# Patient Record
Sex: Male | Born: 1970
Health system: Southern US, Community
[De-identification: ages and names within clinical notes are randomized; demographics above are authoritative.]

## PROBLEM LIST (undated history)

## (undated) DIAGNOSIS — J45909 Unspecified asthma, uncomplicated: Secondary | ICD-10-CM

## (undated) DIAGNOSIS — I10 Essential (primary) hypertension: Secondary | ICD-10-CM

## (undated) DIAGNOSIS — K429 Umbilical hernia without obstruction or gangrene: Secondary | ICD-10-CM

## (undated) DIAGNOSIS — L400 Psoriasis vulgaris: Secondary | ICD-10-CM

## (undated) DIAGNOSIS — J302 Other seasonal allergic rhinitis: Secondary | ICD-10-CM

## (undated) HISTORY — DX: Unspecified asthma, uncomplicated: J45.909

## (undated) HISTORY — DX: Umbilical hernia without obstruction or gangrene: K42.9

## (undated) HISTORY — DX: Psoriasis vulgaris: L40.0

## (undated) HISTORY — PX: TYMPANOSTOMY TUBE PLACEMENT: SHX32

---

## 2009-08-12 ENCOUNTER — Encounter: Admission: RE | Admit: 2009-08-12 | Discharge: 2009-11-10 | Payer: Self-pay | Admitting: Unknown Physician Specialty

## 2009-12-13 ENCOUNTER — Ambulatory Visit (HOSPITAL_BASED_OUTPATIENT_CLINIC_OR_DEPARTMENT_OTHER): Admission: RE | Admit: 2009-12-13 | Discharge: 2009-12-13 | Payer: Self-pay | Admitting: Urology

## 2010-10-28 LAB — GLUCOSE, CAPILLARY: Glucose-Capillary: 136 mg/dL — ABNORMAL HIGH (ref 70–99)

## 2010-10-28 LAB — POCT I-STAT 4, (NA,K, GLUC, HGB,HCT)
Glucose, Bld: 115 mg/dL — ABNORMAL HIGH (ref 70–99)
HCT: 52 % (ref 39.0–52.0)
Hemoglobin: 17.7 g/dL — ABNORMAL HIGH (ref 13.0–17.0)
Potassium: 5.2 mEq/L — ABNORMAL HIGH (ref 3.5–5.1)
Sodium: 137 mEq/L (ref 135–145)

## 2012-01-12 ENCOUNTER — Encounter (HOSPITAL_COMMUNITY): Payer: Self-pay

## 2012-01-12 ENCOUNTER — Emergency Department (INDEPENDENT_AMBULATORY_CARE_PROVIDER_SITE_OTHER)
Admission: EM | Admit: 2012-01-12 | Discharge: 2012-01-12 | Disposition: A | Discharge status: Home / Self Care | Payer: Self-pay | Source: Home / Self Care | Attending: Emergency Medicine | Admitting: Emergency Medicine

## 2012-01-12 DIAGNOSIS — I10 Essential (primary) hypertension: Secondary | ICD-10-CM

## 2012-01-12 DIAGNOSIS — E119 Type 2 diabetes mellitus without complications: Secondary | ICD-10-CM

## 2012-01-12 DIAGNOSIS — J309 Allergic rhinitis, unspecified: Secondary | ICD-10-CM

## 2012-01-12 HISTORY — DX: Essential (primary) hypertension: I10

## 2012-01-12 LAB — POCT I-STAT, CHEM 8
BUN: 9 mg/dL (ref 6–23)
Calcium, Ion: 1.18 mmol/L (ref 1.12–1.32)
Chloride: 105 mEq/L (ref 96–112)
Creatinine, Ser: 0.9 mg/dL (ref 0.50–1.35)
Glucose, Bld: 90 mg/dL (ref 70–99)
HCT: 54 % — ABNORMAL HIGH (ref 39.0–52.0)
Hemoglobin: 18.4 g/dL — ABNORMAL HIGH (ref 13.0–17.0)
Potassium: 4.1 mEq/L (ref 3.5–5.1)
Sodium: 143 mEq/L (ref 135–145)
TCO2: 25 mmol/L (ref 0–100)

## 2012-01-12 MED ORDER — FREESTYLE LANCETS MISC: Status: AC

## 2012-01-12 MED ORDER — GLUCOSE BLOOD VI STRP: ORAL_STRIP | Status: AC

## 2012-01-12 MED ORDER — LORATADINE 10 MG PO TABS: 10.0000 mg | ORAL_TABLET | Freq: Every day | ORAL | Status: DC

## 2012-01-12 MED ORDER — LORATADINE-PSEUDOEPHEDRINE ER 10-240 MG PO TB24: 1.0000 | ORAL_TABLET | Freq: Every day | ORAL | Status: DC

## 2012-01-12 MED ORDER — LISINOPRIL 10 MG PO TABS: 10.0000 mg | ORAL_TABLET | Freq: Every day | ORAL | Status: DC

## 2012-01-12 MED ORDER — METFORMIN HCL 500 MG PO TABS: ORAL_TABLET | ORAL | Status: DC

## 2012-01-12 NOTE — ED Provider Notes (Signed)
Chief Complaint  Patient presents with  . Medication Refill    History of Present Illness:  The patient is a 41 year old male with a 2 to three-year history of type 2 diabetes. He's been on metformin 500 mg 2 in the morning and 1 in the evening at dinnertime. He has not been checking his blood sugars, but does have a FreeStyle machine at home. He denies polyuria, polydipsia, or blurry vision. He's had no hypoglycemic episodes. He denies any changes in his vision. He did see an eye doctor a couple weeks ago and had a normal eye exam including a funduscopic exam, although the doctor was an optometrist. He denies any chest pain, tightness, pressure, or shortness of breath. He's had no extremity pain, paresthesias, swelling, or ulceration. His weight has been stable. He is trying to follow a good diet and exercises 3 days a week. He also has high blood pressure and is on lisinopril 10 mg per day without medication side effects. He does try to limit his salt intake. He checks his blood pressure at home and his range is 100 to 120s systolic over 80-85 diastolic. He also has a history of allergies with ear congestion and nasal congestion for which he is taking cetirizine 10 mg per day.  Review of Systems:  Other than noted above, the patient denies any of the following symptoms. Systemic:  No fever, chills, fatigue, weight loss or gain. Eye:  No blurred vision or diplopia. Lungs:  No cough, wheezing, or shortness of breath. Heart:  No chest pain, tightness, pressure, palpitation, dizziness, syncope, or edema. Abdomen:  No abdominal pain, nausea, vomiting or diarrrhea. GU:  No dysuria, frequency, urgency, hematuria. Ext:  No pain, paresthesias, swelling, or ulcerations. Endocrine:  No polyuria, polydipsia, heat or cold intolerance. Skin:  No rash or itching. Neuro:  No focal weakness or numbness.   PMFSH:  Past medical history, family history, social history, meds, and allergies were  reviewed.  Physical Exam:   Vital signs:  BP 129/82  Pulse 77  Temp(Src) 98.6 F (37 C) (Oral)  Resp 20  SpO2 100% Gen:  Alert, oriented, in no distress. Eye:  PERRL, full EOM, lids conjunctivas, and sclera unremarkable. ENT:  TMs and canals normal.  Mucous membranes moist.  No acetone odor.  Pharynx clear.   Neck:  Supple, full ROM, no adenopathy or tenderness.  No JVD. Lungs:  Clear to auscultation.  No wheezes, rales or rhonchi. Heart:  Regular rhythm.  No gallops or murmers. Abdomen:  Soft, flat, non-distended, nontener.  No hepato-splenomegaly or mass.  Bowel sounds normal.  No pulsatile midline mass or bruit. Ext:  No edema, pulses full.  No ulceration or skin lesions. Skin:  Clear, warm and dry.  No rash or lesions. Neuro:  Alert and oriented times 3.  No focal weakness.  Speech normal.  CNs intact.  Labs:   Results for orders placed during the hospital encounter of 01/12/12  POCT I-STAT, CHEM 8      Component Value Range   Sodium 143  135 - 145 (mEq/L)   Potassium 4.1  3.5 - 5.1 (mEq/L)   Chloride 105  96 - 112 (mEq/L)   BUN 9  6 - 23 (mg/dL)   Creatinine, Ser 1.61  0.50 - 1.35 (mg/dL)   Glucose, Bld 90  70 - 99 (mg/dL)   Calcium, Ion 0.96  0.45 - 1.32 (mmol/L)   TCO2 25  0 - 100 (mmol/L)   Hemoglobin 18.4 (*) 13.0 -  17.0 (g/dL)   HCT 28.4 (*) 13.2 - 52.0 (%)    Assessment:  The primary encounter diagnosis was Diabetes type 2, controlled. Diagnoses of Hypertension and Allergic rhinitis were also pertinent to this visit.  Plan:   1.  The following meds were prescribed:   New Prescriptions   GLUCOSE BLOOD (FREESTYLE LITE) TEST STRIP    Use as instructed   LANCETS (FREESTYLE) LANCETS    Use as instructed   LISINOPRIL (PRINIVIL) 10 MG TABLET    Take 1 tablet (10 mg total) by mouth daily.   LORATADINE (CLARITIN) 10 MG TABLET    Take 1 tablet (10 mg total) by mouth daily.   LORATADINE-PSEUDOEPHEDRINE (CLARITIN-D 24 HOUR) 10-240 MG PER 24 HR TABLET    Take 1 tablet by  mouth daily.   METFORMIN (GLUCOPHAGE) 500 MG TABLET    2 at breakfast and 1 with evening meal.   2.  The patient was instructed in symptomatic care and handouts were given. 3.  The patient was told to return if becoming worse in any way, if no better in 3 or 4 days, and given some red flag symptoms that would indicate earlier return. He was told to find a primary care physician as soon as possible to give him ongoing refills for this medication. I did give him enough for about 2 months.    Reuben Likes, MD 01/12/12 2142

## 2012-01-12 NOTE — ED Notes (Signed)
Pt recently unemployed, lost insurance, cannot see MD ; needs refills of lisinopril, metformin, citerizine

## 2012-01-12 NOTE — Discharge Instructions (Signed)
Arterial Hypertension Arterial hypertension (high blood pressure) is a condition of elevated pressure in your blood vessels. Hypertension over a long period of time is a risk factor for strokes, heart attacks, and heart failure. It is also the leading cause of kidney (renal) failure.  CAUSES   In Adults -- Over 90% of all hypertension has no known cause. This is called essential or primary hypertension. In the other 10% of people with hypertension, the increase in blood pressure is caused by another disorder. This is called secondary hypertension. Important causes of secondary hypertension are:   Heavy alcohol use.   Obstructive sleep apnea.   Hyperaldosterosim (Conn's syndrome).   Steroid use.   Chronic kidney failure.   Hyperparathyroidism.   Medications.   Renal artery stenosis.   Pheochromocytoma.   Cushing's disease.   Coarctation of the aorta.   Scleroderma renal crisis.   Licorice (in excessive amounts).   Drugs (cocaine, methamphetamine).  Your caregiver can explain any items above that apply to you.  In Children -- Secondary hypertension is more common and should always be considered.   Pregnancy -- Few women of childbearing age have high blood pressure. However, up to 10% of them develop hypertension of pregnancy. Generally, this will not harm the woman. It Even be a sign of 3 complications of pregnancy: preeclampsia, HELLP syndrome, and eclampsia. Follow up and control with medication is necessary.  SYMPTOMS   This condition normally does not produce any noticeable symptoms. It is usually found during a routine exam.   Malignant hypertension is a late problem of high blood pressure. It Monger have the following symptoms:   Headaches.   Blurred vision.   End-organ damage (this means your kidneys, heart, lungs, and other organs are being damaged).   Stressful situations can increase the blood pressure. If a person with normal blood pressure has their blood  pressure go up while being seen by their caregiver, this is often termed "white coat hypertension." Its importance is not known. It Alkire be related with eventually developing hypertension or complications of hypertension.   Hypertension is often confused with mental tension, stress, and anxiety.  DIAGNOSIS  The diagnosis is made by 3 separate blood pressure measurements. They are taken at least 1 week apart from each other. If there is organ damage from hypertension, the diagnosis Lemire be made without repeat measurements. Hypertension is usually identified by having blood pressure readings:  Above 140/90 mmHg measured in both arms, at 3 separate times, over a couple weeks.   Over 130/80 mmHg should be considered a risk factor and Grisanti require treatment in patients with diabetes.  Blood pressure readings over 120/80 mmHg are called "pre-hypertension" even in non-diabetic patients. To get a true blood pressure measurement, use the following guidelines. Be aware of the factors that can alter blood pressure readings.  Take measurements at least 1 hour after caffeine.   Take measurements 30 minutes after smoking and without any stress. This is another reason to quit smoking - it raises your blood pressure.   Use a proper cuff size. Ask your caregiver if you are not sure about your cuff size.   Most home blood pressure cuffs are automatic. They will measure systolic and diastolic pressures. The systolic pressure is the pressure reading at the start of sounds. Diastolic pressure is the pressure at which the sounds disappear. If you are elderly, measure pressures in multiple postures. Try sitting, lying or standing.   Sit at rest for a minimum of   5 minutes before taking measurements.   You should not be on any medications like decongestants. These are found in many cold medications.   Record your blood pressure readings and review them with your caregiver.  If you have hypertension:  Your caregiver  may do tests to be sure you do not have secondary hypertension (see "causes" above).   Your caregiver may also look for signs of metabolic syndrome. This is also called Syndrome X or Insulin Resistance Syndrome. You may have this syndrome if you have type 2 diabetes, abdominal obesity, and abnormal blood lipids in addition to hypertension.   Your caregiver will take your medical and family history and perform a physical exam.   Diagnostic tests may include blood tests (for glucose, cholesterol, potassium, and kidney function), a urinalysis, or an EKG. Other tests may also be necessary depending on your condition.  PREVENTION  There are important lifestyle issues that you can adopt to reduce your chance of developing hypertension:  Maintain a normal weight.   Limit the amount of salt (sodium) in your diet.   Exercise often.   Limit alcohol intake.   Get enough potassium in your diet. Discuss specific advice with your caregiver.   Follow a DASH diet (dietary approaches to stop hypertension). This diet is rich in fruits, vegetables, and low-fat dairy products, and avoids certain fats.  PROGNOSIS  Essential hypertension cannot be cured. Lifestyle changes and medical treatment can lower blood pressure and reduce complications. The prognosis of secondary hypertension depends on the underlying cause. Many people whose hypertension is controlled with medicine or lifestyle changes can live a normal, healthy life.  RISKS AND COMPLICATIONS  While high blood pressure alone is not an illness, it often requires treatment due to its short- and long-term effects on many organs. Hypertension increases your risk for:  CVAs or strokes (cerebrovascular accident).   Heart failure due to chronically high blood pressure (hypertensive cardiomyopathy).   Heart attack (myocardial infarction).   Damage to the retina (hypertensive retinopathy).   Kidney failure (hypertensive nephropathy).  Your caregiver can  explain list items above that apply to you. Treatment of hypertension can significantly reduce the risk of complications. TREATMENT   For overweight patients, weight loss and regular exercise are recommended. Physical fitness lowers blood pressure.   Mild hypertension is usually treated with diet and exercise. A diet rich in fruits and vegetables, fat-free dairy products, and foods low in fat and salt (sodium) can help lower blood pressure. Decreasing salt intake decreases blood pressure in a 1/3 of people.   Stop smoking if you are a smoker.  The steps above are highly effective in reducing blood pressure. While these actions are easy to suggest, they are difficult to achieve. Most patients with moderate or severe hypertension end up requiring medications to bring their blood pressure down to a normal level. There are several classes of medications for treatment. Blood pressure pills (antihypertensives) will lower blood pressure by their different actions. Lowering the blood pressure by 10 mmHg may decrease the risk of complications by as much as 25%. The goal of treatment is effective blood pressure control. This will reduce your risk for complications. Your caregiver will help you determine the best treatment for you according to your lifestyle. What is excellent treatment for one person, may not be for you. HOME CARE INSTRUCTIONS   Do not smoke.   Follow the lifestyle changes outlined in the "Prevention" section.   If you are on medications, follow the directions   carefully. Blood pressure medications must be taken as prescribed. Skipping doses reduces their benefit. It also puts you at risk for problems.   Follow up with your caregiver, as directed.   If you are asked to monitor your blood pressure at home, follow the guidelines in the "Diagnosis" section above.  SEEK MEDICAL CARE IF:   You think you are having medication side effects.   You have recurrent headaches or lightheadedness.     You have swelling in your ankles.   You have trouble with your vision.  SEEK IMMEDIATE MEDICAL CARE IF:   You have sudden onset of chest pain or pressure, difficulty breathing, or other symptoms of a heart attack.   You have a severe headache.   You have symptoms of a stroke (such as sudden weakness, difficulty speaking, difficulty walking).  MAKE SURE YOU:   Understand these instructions.   Will watch your condition.   Will get help right away if you are not doing well or get worse.  Document Released: 07/27/2005 Document Revised: 07/16/2011 Document Reviewed: 02/24/2007 [facility] Patient Information 2012 [place], [place].Diabetes and Exercise Regular exercise is important and can help:   Control blood glucose (sugar).   Decrease blood pressure.    Control blood lipids (cholesterol, triglycerides).   Improve overall health.  BENEFITS FROM EXERCISE  Improved fitness.   Improved flexibility.   Improved endurance.   Increased bone density.   Weight control.   Increased muscle strength.   Decreased body fat.   Improvement of the body's use of insulin, a hormone.   Increased insulin sensitivity.   Reduction of insulin needs.   Reduced stress and tension.   Helps you feel better.  People with diabetes who add exercise to their lifestyle gain additional benefits, including:  Weight loss.   Reduced appetite.   Improvement of the body's use of blood glucose.   Decreased risk factors for heart disease:   Lowering of cholesterol and triglycerides.   Raising the level of good cholesterol (high-density lipoproteins, HDL).   Lowering blood sugar.   Decreased blood pressure.  TYPE 1 DIABETES AND EXERCISE  Exercise will usually lower your blood glucose.   If blood glucose is greater than 240 mg/dl, check urine ketones. If ketones are present, do not exercise.   Location of the insulin injection sites may need to be adjusted with exercise. Avoid  injecting insulin into areas of the body that will be exercised. For example, avoid injecting insulin into:   The arms when playing tennis.   The legs when jogging. For more information, discuss this with your caregiver.   Keep a record of:   Food intake.   Type and amount of exercise.   Expected peak times of insulin action.   Blood glucose levels.  Do this before, during, and after exercise. Review your records with your caregiver. This will help you to develop guidelines for adjusting food intake and insulin amounts.  TYPE 2 DIABETES AND EXERCISE  Regular physical activity can help control blood glucose.   Exercise is important because it may:   Increase the body's sensitivity to insulin.   Improve blood glucose control.   Exercise reduces the risk of heart disease. It decreases serum cholesterol and triglycerides. It also lowers blood pressure.   Those who take insulin or oral hypoglycemic agents should watch for signs of hypoglycemia. These signs include dizziness, shaking, sweating, chills, and confusion.   Body water is lost during exercise. It must be replaced. This will  help to avoid loss of body fluids (dehydration) or heat stroke.  Be sure to talk to your caregiver before starting an exercise program to make sure it is safe for you. Remember, any activity is better than none.  Document Released: 10/17/2003 Document Revised: 07/16/2011 Document Reviewed: 01/31/2009 [facility] Patient Information 2012 [place], [place].Diabetes, Type 2 Diabetes is a long-lasting (chronic) disease. In type 2 diabetes, the pancreas does not make enough insulin (a hormone), and the body does not respond normally to the insulin that is made. This type of diabetes was also previously called adult-onset diabetes. It usually occurs after the age of 34, but it can occur at any age.  CAUSES  Type 2 diabetes happens because the pancreasis not making enough insulin or your body has trouble using the  insulin that your pancreas does make properly. SYMPTOMS   Drinking more than usual.   Urinating more than usual.   Blurred vision.   Dry, itchy skin.   Frequent infections.   Feeling more tired than usual (fatigue).  DIAGNOSIS The diagnosis of type 2 diabetes is usually made by one of the following tests:  Fasting blood glucose test. You will not eat for at least 8 hours and then take a blood test.   Random blood glucose test. Your blood glucose (sugar) is checked at any time of the day regardless of when you ate.   Oral glucose tolerance test (OGTT). Your blood glucose is measured after you have not eaten (fasted) and then after you drink a glucose containing beverage.  TREATMENT   Healthy eating.   Exercise.   Medicine, if needed.   Monitoring blood glucose.   Seeing your caregiver regularly.  HOME CARE INSTRUCTIONS   Check your blood glucose at least once a day. More frequent monitoring may be necessary, depending on your medicines and on how well your diabetes is controlled. Your caregiver will advise you.   Take your medicine as directed by your caregiver.   Do not smoke.   Make wise food choices. Ask your caregiver for information. Weight loss can improve your diabetes.   Learn about low blood glucose (hypoglycemia) and how to treat it.   Get your eyes checked regularly.   Have a yearly physical exam. Have your blood pressure checked and your blood and urine tested.   Wear a pendant or bracelet saying that you have diabetes.   Check your feet every night for cuts, sores, blisters, and redness. Let your caregiver know if you have any problems.  SEEK MEDICAL CARE IF:   You have problems keeping your blood glucose in target range.   You have problems with your medicines.   You have symptoms of an illness that do not improve after 24 hours.   You have a sore or wound that is not healing.   You notice a change in vision or a new problem with your vision.    You have a fever.  MAKE SURE YOU:  Understand these instructions.   Will watch your condition.   Will get help right away if you are not doing well or get worse.  Document Released: 07/27/2005 Document Revised: 07/16/2011 Document Reviewed: 01/12/2011 [facility] Patient Information 2012 [place], [place].  If you have no primary doctor, here are some resources that may be helpful:  Medicaid-accepting [facility] Providers:   - [facility]- [address]      [phone]      Mon-Fri 9am-7pm, Sat 9am-1pm   -  [facility]- [address] [place], [address] [place]      [phone]   - [facility]- [address] [place]      [phone]   [facility]- [address]      [phone]   - Renaye Rakers- [address] [place], [address]      [phone]      Only accepts [place] patients       after they have her name applied to their card   Self Pay (no insurance) in [place]:   - Sickle Cell Patients: Dr Willey Blade, [facility]      [address] [place]      [phone]   - [facility] Connect445 [phone]   - Physician Referral Service- [phone]   - [facility]- [address] [place]      [phone]   [facility]- [address]   - [facility]- see information above      (Speak to [employer] if you do not have insurance)   - [facility]- [address] [place]      [phone]   - [facility] [place]- [address]      [phone]   - [facility]- [address]      [phone]   - Dr Julio Sicks-  [address], [place]      [phone]   - [facility]- [address]      [phone]   - [facility]- [address]      [phone]      Also [address]      [phone]   - [facility]- [address]      [phone]      1st and 3rd  Saturday every month, 10am-1pm Other agencies that provide inexpensive medical care:    [facility]  [phone]    [facility]  [phone]    [facility]  [phone]    [facility]  [phone]    [facility]  [phone]  General Information: Finding a doctor when you do not have health insurance can be tricky. Although you are not limited by an insurance plan, you are of course limited by her finances and how much but he can pay out of pocket.  What are your options if you don't have health insurance?   1) Find a Librarian, academic and Pay Out of Pocket Although you won't have to find out who is covered by your insurance plan, it is a good idea to ask around and get recommendations. You will then need to call the office and see if the doctor you have chosen will accept you as a new patient and what types of options they offer for patients who are self-pay. Some doctors offer discounts or will set up payment plans for their patients who do not have insurance, but you will need to ask so you aren't surprised when you get to your appointment.  2) Contact Your Local Health Department Not all health departments have doctors that can see patients for sick visits, but many do, so it is worth a call to see if yours does. If you don't know where your local health department is, you can check in your phone book.  The CDC also has a tool to help you locate your state's health department, and many state websites also have listings of all of their local health departments.  3) Find a Walk-in Clinic If your illness is not likely to be very severe or complicated, you may want to try a walk in clinic. These are popping up all over the country in pharmacies, drugstores, and shopping centers. They're usually staffed by nurse practitioners or physician assistants that have been trained to treat common illnesses and complaints. They're usually fairly quick and inexpensive. However, if  you have serious medical issues or chronic medical problems, these are probably not your best option

## 2012-01-12 NOTE — ED Notes (Signed)
Vitals obtained by [name] student 

## 2012-02-20 LAB — BASIC METABOLIC PANEL
BUN: 14 mg/dL (ref 4–21)
Creatinine: 1 mg/dL (ref <=1.3)
Glucose: 90 mg/dL
Potassium: 4.8 mmol/L (ref 3.4–5.3)
Sodium: 140 mmol/L (ref 137–147)

## 2012-02-20 LAB — LIPID PANEL
Cholesterol: 237 mg/dL — AB (ref 0–200)
HDL: 43 mg/dL (ref 35–70)
LDL Cholesterol: 168 mg/dL
Triglycerides: 129 mg/dL (ref 40–160)

## 2012-02-20 LAB — TSH: TSH: 1.43 u[IU]/mL (ref <=5.90)

## 2012-02-20 LAB — HEMOGLOBIN A1C: Hgb A1c MFr Bld: 7.2 % — AB (ref 4.0–6.0)

## 2012-02-20 LAB — HEPATIC FUNCTION PANEL
ALT: 72 U/L — AB (ref 10–40)
AST: 43 U/L — AB (ref 14–40)

## 2012-04-03 LAB — HEMOGLOBIN A1C: Hgb A1c MFr Bld: 7.4 % — AB (ref 4.0–6.0)

## 2012-05-14 ENCOUNTER — Ambulatory Visit (INDEPENDENT_AMBULATORY_CARE_PROVIDER_SITE_OTHER): Payer: BC Managed Care – PPO | Admitting: Family Medicine

## 2012-05-14 VITALS — BP 120/76 | HR 85 | Temp 98.3°F | Resp 18 | Ht 63.0 in | Wt 193.2 lb

## 2012-05-14 DIAGNOSIS — E119 Type 2 diabetes mellitus without complications: Secondary | ICD-10-CM

## 2012-05-14 DIAGNOSIS — I10 Essential (primary) hypertension: Secondary | ICD-10-CM

## 2012-05-14 DIAGNOSIS — L219 Seborrheic dermatitis, unspecified: Secondary | ICD-10-CM

## 2012-05-14 MED ORDER — LISINOPRIL 10 MG PO TABS: 10.0000 mg | ORAL_TABLET | Freq: Every day | ORAL | Status: DC

## 2012-05-14 MED ORDER — SITAGLIPTIN PHOS-METFORMIN HCL 50-500 MG PO TABS: 1.0000 | ORAL_TABLET | Freq: Two times a day (BID) | ORAL | Status: DC

## 2012-05-14 MED ORDER — KETOCONAZOLE 2 % EX SHAM: MEDICATED_SHAMPOO | CUTANEOUS | Status: DC

## 2012-05-14 NOTE — Progress Notes (Signed)
41 yo man with diabetes who was seen in [place] last month (hgb A1C 7.2), was told he had slight elevation of liver function.  He has known umbilical hernia  He is here for intermittent mild sharp discomfort upper abdomen. No N, V, D.  Diabetes began three years ago.  Objective:  NAD, overweight.  HEENT:  Unremarkable  Neck:  Supple, no adenopathy Chest: clear Heart:  Regular, no murmur Abdomen:  Obese, soft, slight tenderness left abdomen with deep palpation  Assessment: 1. Fatty liver 2. Mild diabetes 3. Seborrheic dermatitis 1. Diabetes mellitus  sitaGLIPtan-metformin (JANUMET) 50-500 MG per tablet  2. Seborrheic dermatitis of scalp  ketoconazole (NIZORAL) 2 % shampoo  3. Hypertension  lisinopril (PRINIVIL,ZESTRIL) 10 MG tablet   Recheck 6-8 weeks

## 2012-05-18 ENCOUNTER — Telehealth: Payer: Self-pay

## 2012-05-18 NOTE — Telephone Encounter (Signed)
Dr Milus Glazier, did you want pt taking janumet just QD or BID? The sig says BID but you only gave him #30 plus RFs and the pt thought you told him once daily. Please advise if Marja Kays is an option for pt? It looks like you gave the pt RFs of both meds and you want him to f/up in 6-8 weeks, so he should have enough RFs.

## 2012-05-18 NOTE — Telephone Encounter (Signed)
The patient called concerned about the amount of refills he was given on his Lisinopril and his Janumet.  The patient is also concerned about the Janument dose of 2 pills daily.  The patient thought he was told one pill daily.  The patient would also like to know about the medication Invocana and if it is an option for him.  Please call the patient at [phone].  The patient stated it is ok to leave voicemail.

## 2012-05-18 NOTE — Telephone Encounter (Signed)
daily

## 2012-05-19 NOTE — Telephone Encounter (Signed)
LMOM to CB. 

## 2012-05-20 ENCOUNTER — Other Ambulatory Visit: Payer: Self-pay | Admitting: Radiology

## 2012-05-20 MED ORDER — LORATADINE 10 MG PO TABS: 10.0000 mg | ORAL_TABLET | Freq: Every day | ORAL | Status: DC

## 2012-05-20 NOTE — Telephone Encounter (Signed)
Do you have any records from [facility]? He asked them to send records. Also he wants to know if you would consider changing him to [facility], I advised him I am not very familiar with this. He thinks skin rash may be from the Metformin, he said you do not need to change it now, he will discuss with you when he comes in 6 weeks.

## 2012-05-20 NOTE — Telephone Encounter (Signed)
I do not have records from [facility].  Metformin rashes are uncommon.  Invokana is one of the newest diabetes medicines, and, as such, there is less safety information about it.

## 2012-05-21 ENCOUNTER — Telehealth: Payer: Self-pay

## 2012-05-21 NOTE — Telephone Encounter (Signed)
Left message for patient to return call.

## 2012-05-21 NOTE — Telephone Encounter (Signed)
patient returned call. Notified and voiced understanding. He is going to call [facility] and check status on records.

## 2012-05-30 ENCOUNTER — Encounter: Payer: Self-pay | Admitting: *Deleted

## 2012-05-31 ENCOUNTER — Encounter: Payer: Self-pay | Admitting: Family Medicine

## 2012-05-31 DIAGNOSIS — I10 Essential (primary) hypertension: Secondary | ICD-10-CM

## 2012-05-31 DIAGNOSIS — E119 Type 2 diabetes mellitus without complications: Secondary | ICD-10-CM

## 2012-06-09 ENCOUNTER — Ambulatory Visit: Payer: BC Managed Care – PPO

## 2012-06-20 ENCOUNTER — Telehealth: Payer: Self-pay

## 2012-06-20 NOTE — Telephone Encounter (Signed)
Once a day should do it.  Recheck 3 months

## 2012-06-20 NOTE — Telephone Encounter (Signed)
The patient came in to ask what is the dosing schedule for his Janumet rx. The patient states he thought he was supposed to take one pill per day, but the pharmacy stated the rx is for 2 pills daily.  Please clarify for patient.  Karin Golden at [facility] is the pharmacy.  The patient states that they pharmacy has tried to contact [facility] without response.  Please call the patient at [phone].

## 2012-06-20 NOTE — Telephone Encounter (Signed)
Dr L, please clarify how you want pt to take his Janumet. Rx is for BID but only gave him #30 per month.

## 2012-06-21 ENCOUNTER — Ambulatory Visit (INDEPENDENT_AMBULATORY_CARE_PROVIDER_SITE_OTHER): Payer: BC Managed Care – PPO | Admitting: Family Medicine

## 2012-06-21 DIAGNOSIS — R945 Abnormal results of liver function studies: Secondary | ICD-10-CM

## 2012-06-21 DIAGNOSIS — I10 Essential (primary) hypertension: Secondary | ICD-10-CM

## 2012-06-21 DIAGNOSIS — E119 Type 2 diabetes mellitus without complications: Secondary | ICD-10-CM

## 2012-06-21 DIAGNOSIS — H6590 Unspecified nonsuppurative otitis media, unspecified ear: Secondary | ICD-10-CM

## 2012-06-21 DIAGNOSIS — E785 Hyperlipidemia, unspecified: Secondary | ICD-10-CM

## 2012-06-21 DIAGNOSIS — H9209 Otalgia, unspecified ear: Secondary | ICD-10-CM

## 2012-06-21 DIAGNOSIS — R7989 Other specified abnormal findings of blood chemistry: Secondary | ICD-10-CM

## 2012-06-21 LAB — LIPID PANEL
Cholesterol: 215 mg/dL — ABNORMAL HIGH (ref 0–200)
HDL: 34 mg/dL — ABNORMAL LOW (ref >39)
LDL Cholesterol: 158 mg/dL — ABNORMAL HIGH (ref 0–99)
Total CHOL/HDL Ratio: 6.3 Ratio
Triglycerides: 113 mg/dL (ref <150)
VLDL: 23 mg/dL (ref 0–40)

## 2012-06-21 LAB — POCT GLYCOSYLATED HEMOGLOBIN (HGB A1C): Hemoglobin A1C: 7.9

## 2012-06-21 LAB — COMPREHENSIVE METABOLIC PANEL
ALT: 61 U/L — ABNORMAL HIGH (ref 0–53)
AST: 31 U/L (ref 0–37)
Albumin: 4.8 g/dL (ref 3.5–5.2)
Alkaline Phosphatase: 79 U/L (ref 39–117)
BUN: 12 mg/dL (ref 6–23)
CO2: 25 mEq/L (ref 19–32)
Calcium: 9.4 mg/dL (ref 8.4–10.5)
Chloride: 101 mEq/L (ref 96–112)
Creat: 0.98 mg/dL (ref 0.50–1.35)
Glucose, Bld: 135 mg/dL — ABNORMAL HIGH (ref 70–99)
Potassium: 4.4 mEq/L (ref 3.5–5.3)
Sodium: 137 mEq/L (ref 135–145)
Total Bilirubin: 0.7 mg/dL (ref 0.3–1.2)
Total Protein: 8.1 g/dL (ref 6.0–8.3)

## 2012-06-21 LAB — GLUCOSE, POCT (MANUAL RESULT ENTRY): POC Glucose: 134 mg/dl — AB (ref 70–99)

## 2012-06-21 MED ORDER — SITAGLIPTIN PHOS-METFORMIN HCL 50-500 MG PO TABS: 1.0000 | ORAL_TABLET | Freq: Every day | ORAL | Status: DC

## 2012-06-21 MED ORDER — FLUTICASONE PROPIONATE 50 MCG/ACT NA SUSP: 2.0000 | Freq: Every day | NASAL | Status: DC

## 2012-06-21 MED ORDER — LISINOPRIL 10 MG PO TABS: 10.0000 mg | ORAL_TABLET | Freq: Every day | ORAL | Status: DC

## 2012-06-21 MED ORDER — SITAGLIPTIN PHOS-METFORMIN HCL 50-500 MG PO TABS: 1.0000 | ORAL_TABLET | Freq: Two times a day (BID) | ORAL | Status: DC

## 2012-06-21 NOTE — Telephone Encounter (Signed)
Gave pt info from Dr L and notified new Rx had been sent to pharmacy to correct the directions. Pt agreed.

## 2012-06-21 NOTE — Progress Notes (Signed)
@[email or web address] @  Patient ID: Logan [name] MRN: [id number], DOB: [birth year], 41 y.o. Date of Encounter: 06/21/2012, 9:14 AM  Primary Physician: No primary provider on file.  Chief Complaint: Diabetes follow up  HPI: 41 y.o. year old male with history below presents for follow up of diabetes mellitus. Doing well. No issues or complaints. Taking medications daily without adverse effects. No polydipsia, polyphagia, polyuria, or nocturia.  Blood sugars at home: 90-150 Diet consists of:  Having trouble with portion control and carbohydrate Exercising regularly.but plans to ramp this up.  Walking 2-3 miles/ day Recently saw Dr. Jearld Fenton re: blocked hearing right ear and dizziness intermittently  Last A1C: 7.2 Eye MD: this year I Patient saw the ENT doctor last week who prescribed clindamycin but patient has not started taking this yet. He's not taking the Flonase regularly either. Instead he is relying on Claritin.  Patient is going to see a dermatologist about his scalp as well in the coming days. The Nizoral shampoo did not help much and so he took a prescription of cortisone which did help somewhat. He notices that he has linear dark lines under his nails which is been going on for a year. Past Medical History  Diagnosis Date  . Diabetes mellitus   . Hypertension   . Allergy   . Hernia, umbilical      Home Meds: Prior to Admission medications   Medication Sig Start Date End Date Taking? Authorizing Provider  glucose blood (FREESTYLE LITE) test strip Use as instructed 01/12/12 01/11/13 Yes Reuben Likes, MD  ketoconazole (NIZORAL) 2 % shampoo Apply topically 2 (two) times a week. 05/14/12  Yes Elvina Sidle, MD  Lancets (FREESTYLE) lancets Use as instructed 01/12/12 01/11/13 Yes Reuben Likes, MD  lisinopril (PRINIVIL) 10 MG tablet Take 1 tablet (10 mg total) by mouth daily. 05/14/12 05/14/13 Yes Elvina Sidle, MD  loratadine (CLARITIN) 10 MG tablet Take 1 tablet (10 mg total) by mouth daily.  05/20/12 05/20/13 Yes Elvina Sidle, MD  sitaGLIPtan-metformin (JANUMET) 50-500 MG per tablet Take 1 tablet by mouth 2 (two) times daily with a meal. 05/14/12  Yes Elvina Sidle, MD  cetirizine (ZYRTEC) 10 MG tablet Take 10 mg by mouth daily.    Historical Provider, MD  lisinopril (PRINIVIL,ZESTRIL) 10 MG tablet Take 1 tablet (10 mg total) by mouth daily. 05/14/12   Elvina Sidle, MD  loratadine-pseudoephedrine (CLARITIN-D 24 HOUR) 10-240 MG per 24 hr tablet Take 1 tablet by mouth daily. 01/12/12 01/11/13  Reuben Likes, MD  metFORMIN (GLUCOPHAGE) 500 MG tablet Take 500 mg by mouth as needed.    Historical Provider, MD  metFORMIN (GLUCOPHAGE) 500 MG tablet 2 at breakfast and 1 with evening meal. 01/12/12   Reuben Likes, MD    Allergies:  Allergies  Allergen Reactions  . Penicillins     History   Social History  . Marital Status: Single    Spouse Name: N/A    Number of Children: N/A  . Years of Education: N/A   Occupational History  . Not on file.   Social History Main Topics  . Smoking status: Never Smoker   . Smokeless tobacco: Not on file  . Alcohol Use: No  . Drug Use: No  . Sexually Active: No   Other Topics Concern  . Not on file   Social History Narrative  . No narrative on file     Review of Systems: Constitutional: negative for chills, fever, night sweats, weight changes, or fatigue  HEENT: negative  for vision changes, hearing loss, congestion, rhinorrhea, or epistaxis Cardiovascular: negative for chest pain, palpitations, diaphoresis, DOE, orthopnea, or edema Respiratory: negative for hemoptysis, wheezing, shortness of breath, dyspnea, or cough Abdominal: negative for abdominal pain, nausea, vomiting, diarrhea, or constipation Dermatological: negative forerythema, or wounds Neurologic: negative for headache, dizziness, or syncope Renal:  Negative for polyuria, polydipsia, or dysuria All other systems reviewed and are otherwise negative with the exception to  those above and in the HPI.   Physical Exam: There were no vitals taken for this visit., There is no height or weight on file to calculate BMI. General: Well developed, well nourished, in no acute distress. Head: Normocephalic, atraumatic, eyes without discharge, sclera non-icteric, nares are without discharge. Bilateral auditory canals clear, TM's are without perforation, pearly grey and translucent with reflective cone of light bilaterally. Oral cavity moist, posterior pharynx without exudate, erythema, peritonsillar abscess, or post nasal drip.  Neck: Supple. No thyromegaly. Full ROM. No lymphadenopathy. Lungs: Clear bilaterally to auscultation without wheezes, rales, or rhonchi. Breathing is unlabored. Heart: RRR with S1 S2. No murmurs, rubs, or gallops appreciated. Abdomen: Soft, non-tender, non-distended with normoactive bowel sounds. No hepatosplenomegaly. No rebound/guarding. No obvious abdominal masses. Msk:  Strength and tone normal for age. Extremities/Skin: Warm and dry. No clubbing or cyanosis. No edema. No rashes, wounds, or suspicious lesions. Monofilament exam unremarkable bilaterally.  Neuro: Alert and oriented X 3. Moves all extremities spontaneously. Gait is normal. CNII-XII grossly in tact. Psych:  Responds to questions appropriately with a normal affect.  Skin: Erythematous scalp without obvious psoriasis which is confluent throughout the scalp and extends a bit over the left for head. He also has linear dark lines of his nails as described above. These involve all of his fingernails and a couple of his toenails. Labs: Results for orders placed in visit on 06/21/12  POCT GLYCOSYLATED HEMOGLOBIN (HGB A1C)      Component Value Range   Hemoglobin A1C 7.9    GLUCOSE, POCT (MANUAL RESULT ENTRY)      Component Value Range   POC Glucose 134 (*) 70 - 99 mg/dl     ASSESSMENT AND PLAN:  41 y.o. year old male with type 2 diabetes, hyperlipidemia, and elevated LFT's.  Reasonable  control, should improve with more exercise  CPE one month 1. Type 2 diabetes mellitus  Comprehensive metabolic panel, POCT glycosylated hemoglobin (Hb A1C), Lipid panel, Microalbumin, urine, POCT glucose (manual entry), sitaGLIPtan-metformin (JANUMET) 50-500 MG per tablet, lisinopril (PRINIVIL,ZESTRIL) 10 MG tablet  2. Other and unspecified hyperlipidemia    3. Abnormal LFTs    4. Ear discomfort    5. Diabetes mellitus  sitaGLIPtan-metformin (JANUMET) 50-500 MG per tablet  6. Hypertension  lisinopril (PRINIVIL,ZESTRIL) 10 MG tablet   Obtain old records -  Signed, Elvina Sidle, MD 06/21/2012 9:14 AM

## 2012-06-22 ENCOUNTER — Other Ambulatory Visit: Payer: Self-pay | Admitting: Family Medicine

## 2012-06-22 ENCOUNTER — Encounter: Payer: Self-pay | Admitting: *Deleted

## 2012-06-22 DIAGNOSIS — E785 Hyperlipidemia, unspecified: Secondary | ICD-10-CM

## 2012-06-22 LAB — MICROALBUMIN, URINE: Microalb, Ur: 1.12 mg/dL (ref 0.00–1.89)

## 2012-06-22 MED ORDER — SIMVASTATIN 20 MG PO TABS: 20.0000 mg | ORAL_TABLET | Freq: Every day | ORAL | Status: DC

## 2012-08-13 NOTE — Telephone Encounter (Signed)
Error

## 2012-08-15 ENCOUNTER — Other Ambulatory Visit: Payer: Self-pay | Admitting: Family Medicine

## 2012-08-25 ENCOUNTER — Encounter: Payer: BC Managed Care – PPO | Admitting: Family Medicine

## 2012-08-28 ENCOUNTER — Other Ambulatory Visit: Payer: Self-pay | Admitting: Family Medicine

## 2012-09-19 ENCOUNTER — Telehealth: Payer: Self-pay

## 2012-09-19 NOTE — Telephone Encounter (Signed)
PATIENT WOULD LIKE A REFILL OF METFORMIN. HE USED TO BE ON METFORMIN AND DR Milus Glazier CHANGED HIM TO JANUMENT BUT IT IS TOO EXPENSIVE AND HE WOULD LIKE TO GO BACK TO METFORMIN. HE ONLY HAS 2-3 DAYS LEFT SO HE WOULD LIKE IT DONE AS SOON AS POSSIBLE  [employer]

## 2012-09-20 ENCOUNTER — Telehealth: Payer: Self-pay

## 2012-09-20 NOTE — Telephone Encounter (Signed)
Pt says he has been waiting on a call about his rx please call at [phone]

## 2012-09-21 NOTE — Telephone Encounter (Signed)
Patient states he can not afford the Janumet/ would like more cost effective medication, wants to know if he can take regular Metformin? Please advise.

## 2012-09-22 NOTE — Telephone Encounter (Signed)
Please call the patient. The Janumet is a combination of metformin and Januvia, so taking just metformin would result in decreased glucose control.  Since his last several A1C measurements have been just inside the range of control, decreasing his therapy at this time is not advised. However, his insurance company may have a preferred product, and he can contact the pharmacy benefit manager for his plan (the number is on the back of his card, or he may be able to view the drug formulary for his plan online) to see if Onglyza or Combiglyze has preferred status over Januvia/Janumet. Regardless, he's due for follow-up with Dr. Milus Glazier, and alternatives can be discussed at that time.

## 2012-09-23 NOTE — Telephone Encounter (Signed)
I called patient left message for him to call me back.

## 2012-09-24 NOTE — Telephone Encounter (Signed)
Patient called back, will try again on Monday Morning,  Please call him asap [phone]

## 2012-09-24 NOTE — Telephone Encounter (Signed)
Called and notified patient response to previous message. He states that Dr. Milus Glazier and him discussed working on diet and exercise and that the medication change was a choice and did not have to change from Metformin. He did not know at that time how expensive the medication was. He does not have a job and has been making changes to diet. He has been eating lean chicken and [place], increased veggies, and has been exercising at least 2 times a week. He has been monitoring  Blood sugar and it has been running 90-110. He just wants to go back on Metformin. States it was working fine for him and he is not interested in experimenting with any of the other ones. Wants to know if he can go back on Metformin until Dr. Milus Glazier comes back in office. Spoke with Luan Pulling PA-c and she gave verbal for 6 week supply of Metformin 500 mg 1 po bid and follow up with Dr. Milus Glazier. Patient notified and voiced understanding. Called in Metformin [facility] [employer].

## 2012-09-28 ENCOUNTER — Emergency Department (HOSPITAL_COMMUNITY)
Admission: EM | Admit: 2012-09-28 | Discharge: 2012-09-28 | Disposition: A | Discharge status: Home / Self Care | Payer: 59 | Attending: Emergency Medicine | Admitting: Emergency Medicine

## 2012-09-28 ENCOUNTER — Encounter (HOSPITAL_COMMUNITY): Payer: Self-pay | Admitting: Emergency Medicine

## 2012-09-28 ENCOUNTER — Ambulatory Visit: Payer: 59

## 2012-09-28 DIAGNOSIS — IMO0002 Reserved for concepts with insufficient information to code with codable children: Secondary | ICD-10-CM | POA: Insufficient documentation

## 2012-09-28 DIAGNOSIS — R5381 Other malaise: Secondary | ICD-10-CM | POA: Insufficient documentation

## 2012-09-28 DIAGNOSIS — E1169 Type 2 diabetes mellitus with other specified complication: Secondary | ICD-10-CM | POA: Insufficient documentation

## 2012-09-28 DIAGNOSIS — Z79899 Other long term (current) drug therapy: Secondary | ICD-10-CM | POA: Insufficient documentation

## 2012-09-28 DIAGNOSIS — I10 Essential (primary) hypertension: Secondary | ICD-10-CM | POA: Insufficient documentation

## 2012-09-28 DIAGNOSIS — Z8719 Personal history of other diseases of the digestive system: Secondary | ICD-10-CM | POA: Insufficient documentation

## 2012-09-28 DIAGNOSIS — E119 Type 2 diabetes mellitus without complications: Secondary | ICD-10-CM

## 2012-09-28 LAB — GLUCOSE, CAPILLARY: Glucose-Capillary: 280 mg/dL — ABNORMAL HIGH (ref 70–99)

## 2012-09-28 MED ORDER — METFORMIN HCL 500 MG PO TABS: 1000.0000 mg | ORAL_TABLET | Freq: Two times a day (BID) | ORAL | Status: DC

## 2012-09-28 NOTE — ED Provider Notes (Signed)
History     CSN: [id number]  Arrival date & time 09/28/12  1905   First MD Initiated Contact with Patient 09/28/12 2204      Chief Complaint  Patient presents with  . Hyperglycemia    (Consider location/radiation/quality/duration/timing/severity/associated sxs/prior treatment) The history is provided by the patient.   Patient here complaining of hyperglycemia. He has not been compliant with strict diabetic diet. This polyuria and polydipsia. Some weakness. Has currently run out of his metformin and is requesting a refill. Past Medical History  Diagnosis Date  . Diabetes mellitus   . Hypertension   . Allergy   . Hernia, umbilical     History reviewed. No pertinent past surgical history.  Family History  Problem Relation Age of Onset  . Hypertension Mother   . Diverticulosis Mother   . Osteoporosis Mother   . Diabetes Father   . Alcohol abuse Father   . Osteoporosis Sister   . Hypertension Sister   . Diabetes Brother   . Hypertension Brother   . Hypertension Maternal Grandmother   . Heart disease Maternal Grandfather   . Diabetes Paternal Grandfather   . Diabetes Brother   . Hypertension Brother   . Heart attack Brother   . Osteoporosis Sister     History  Substance Use Topics  . Smoking status: Never Smoker   . Smokeless tobacco: Not on file  . Alcohol Use: No      Review of Systems  All other systems reviewed and are negative.    Allergies  Penicillins  Home Medications   Current Outpatient Rx  Name  Route  Sig  Dispense  Refill  . lisinopril (PRINIVIL,ZESTRIL) 10 MG tablet   Oral   Take 1 tablet (10 mg total) by mouth daily.   90 tablet   2   . loratadine (CLARITIN) 10 MG tablet   Oral   Take 10 mg by mouth daily.         . metFORMIN (GLUCOPHAGE) 500 MG tablet   Oral   Take 500-1,000 mg by mouth 3 (three) times daily. 2 tabs in am, 1 tab in noon, and 1 tab in pm.         . vitamin C (ASCORBIC ACID) 500 MG tablet   Oral   Take  500 mg by mouth daily.         . fluticasone (FLONASE) 50 MCG/ACT nasal spray   Nasal   Place 2 sprays into the nose daily.   16 g   6   . glucose blood (FREESTYLE LITE) test strip      Use as instructed   100 each   12   . Lancets (FREESTYLE) lancets      Use as instructed   100 each   12     BP 117/90  Pulse 87  Temp(Src) 98.5 F (36.9 C)  Resp 20  SpO2 99%  Physical Exam  Nursing note and vitals reviewed. Constitutional: He is oriented to person, place, and time. He appears well-developed and well-nourished.  Non-toxic appearance. No distress.  HENT:  Head: Normocephalic and atraumatic.  Eyes: Conjunctivae, EOM and lids are normal. Pupils are equal, round, and reactive to light.  Neck: Normal range of motion. Neck supple. No tracheal deviation present. No mass present.  Cardiovascular: Normal rate, regular rhythm and normal heart sounds.  Exam reveals no gallop.   No murmur heard. Pulmonary/Chest: Effort normal and breath sounds normal. No stridor. No respiratory distress. He has no  decreased breath sounds. He has no wheezes. He has no rhonchi. He has no rales.  Abdominal: Soft. Normal appearance and bowel sounds are normal. He exhibits no distension. There is no tenderness. There is no rebound and no CVA tenderness.  Musculoskeletal: Normal range of motion. He exhibits no edema and no tenderness.  Neurological: He is alert and oriented to person, place, and time. He has normal strength. No cranial nerve deficit or sensory deficit. GCS eye subscore is 4. GCS verbal subscore is 5. GCS motor subscore is 6.  Skin: Skin is warm and dry. No abrasion and no rash noted.  Psychiatric: He has a normal mood and affect. His speech is normal and behavior is normal.    ED Course  Procedures (including critical care time)  Labs Reviewed  GLUCOSE, CAPILLARY - Abnormal; Notable for the following:    Glucose-Capillary 280 (*)    All other components within normal limits   No  results found.   No diagnosis found.    MDM  Patient's blood sugar noted here. He was instructed to keep with strict diabetic diet. I will refill his metformin and give him some resources        Toy Baker, MD 09/28/12 2225

## 2012-09-28 NOTE — ED Notes (Signed)
Pt states his blood sugar is running high  Pt states he was on metformin but his dr changed his medication and could not afford it so he went back on the metformin and now it is not controlling his blood sugar

## 2012-10-13 ENCOUNTER — Telehealth: Payer: Self-pay | Admitting: Radiology

## 2012-10-13 NOTE — Telephone Encounter (Signed)
Please advise on metformin refill rc'd FAX from [facility].

## 2012-10-15 NOTE — Telephone Encounter (Signed)
Called him to advise.  

## 2012-10-15 NOTE — Telephone Encounter (Signed)
OK to RF x 30 days, but he's overdue for re-evaluation and fasting labs, which he'll need for more.

## 2013-03-06 ENCOUNTER — Other Ambulatory Visit: Payer: Self-pay | Admitting: Family Medicine

## 2013-06-21 ENCOUNTER — Ambulatory Visit (INDEPENDENT_AMBULATORY_CARE_PROVIDER_SITE_OTHER): Payer: BC Managed Care – PPO | Admitting: Emergency Medicine

## 2013-06-21 DIAGNOSIS — Z0000 Encounter for general adult medical examination without abnormal findings: Secondary | ICD-10-CM

## 2013-06-21 DIAGNOSIS — E119 Type 2 diabetes mellitus without complications: Secondary | ICD-10-CM

## 2013-06-21 DIAGNOSIS — L409 Psoriasis, unspecified: Secondary | ICD-10-CM

## 2013-06-21 DIAGNOSIS — I10 Essential (primary) hypertension: Secondary | ICD-10-CM

## 2013-06-21 DIAGNOSIS — Z23 Encounter for immunization: Secondary | ICD-10-CM

## 2013-06-21 LAB — COMPREHENSIVE METABOLIC PANEL
ALT: 94 U/L — ABNORMAL HIGH (ref 0–53)
AST: 45 U/L — ABNORMAL HIGH (ref 0–37)
Albumin: 4.9 g/dL (ref 3.5–5.2)
Alkaline Phosphatase: 93 U/L (ref 39–117)
BUN: 13 mg/dL (ref 6–23)
CO2: 27 mEq/L (ref 19–32)
Calcium: 9.9 mg/dL (ref 8.4–10.5)
Chloride: 101 mEq/L (ref 96–112)
Creat: 0.95 mg/dL (ref 0.50–1.35)
Glucose, Bld: 148 mg/dL — ABNORMAL HIGH (ref 70–99)
Potassium: 4.9 mEq/L (ref 3.5–5.3)
Sodium: 137 mEq/L (ref 135–145)
Total Bilirubin: 0.5 mg/dL (ref 0.3–1.2)
Total Protein: 8.2 g/dL (ref 6.0–8.3)

## 2013-06-21 LAB — LIPID PANEL
Cholesterol: 226 mg/dL — ABNORMAL HIGH (ref 0–200)
HDL: 42 mg/dL (ref >39)
LDL Cholesterol: 157 mg/dL — ABNORMAL HIGH (ref 0–99)
Total CHOL/HDL Ratio: 5.4 Ratio
Triglycerides: 136 mg/dL (ref <150)
VLDL: 27 mg/dL (ref 0–40)

## 2013-06-21 LAB — POCT CBC
Granulocyte percent: 59.4 %G (ref 37–80)
HCT, POC: 54 % — AB (ref 43.5–53.7)
Hemoglobin: 17.2 g/dL (ref 14.1–18.1)
Lymph, poc: 3.5 — AB (ref 0.6–3.4)
MCH, POC: 29.4 pg (ref 27–31.2)
MCHC: 31.9 g/dL (ref 31.8–35.4)
MCV: 92.2 fL (ref 80–97)
MID (cbc): 0.8 (ref 0–0.9)
MPV: 9.9 fL (ref 0–99.8)
POC Granulocyte: 6.4 (ref 2–6.9)
POC LYMPH PERCENT: 32.8 %L (ref 10–50)
POC MID %: 7.8 %M (ref 0–12)
Platelet Count, POC: 504 10*3/uL — AB (ref 142–424)
RBC: 5.86 M/uL (ref 4.69–6.13)
RDW, POC: 14.3 %
WBC: 10.7 10*3/uL — AB (ref 4.6–10.2)

## 2013-06-21 LAB — POCT URINALYSIS DIPSTICK
Bilirubin, UA: NEGATIVE
Blood, UA: NEGATIVE
Glucose, UA: NEGATIVE
Ketones, UA: NEGATIVE
Leukocytes, UA: NEGATIVE
Nitrite, UA: NEGATIVE
Spec Grav, UA: 1.025
Urobilinogen, UA: 0.2
pH, UA: 5

## 2013-06-21 LAB — POCT GLYCOSYLATED HEMOGLOBIN (HGB A1C): Hemoglobin A1C: 7.9

## 2013-06-21 MED ORDER — METFORMIN HCL ER 500 MG PO TB24: 2000.0000 mg | ORAL_TABLET | Freq: Every day | ORAL | Status: DC

## 2013-06-21 MED ORDER — LISINOPRIL 10 MG PO TABS: 10.0000 mg | ORAL_TABLET | Freq: Every day | ORAL | Status: DC

## 2013-06-21 MED ORDER — TRIAMCINOLONE ACETONIDE 0.1 % EX CREA: 1.0000 "application " | TOPICAL_CREAM | Freq: Two times a day (BID) | CUTANEOUS | Status: DC

## 2013-06-21 NOTE — Addendum Note (Signed)
Addended by: Okey Regal on: 06/21/2013 05:34 PM   Modules accepted: Orders

## 2013-06-21 NOTE — Addendum Note (Signed)
Addended by: Carmelina Dane on: 06/21/2013 05:14 PM   Modules accepted: Orders

## 2013-06-21 NOTE — Patient Instructions (Signed)
Type 2 Diabetes Mellitus, Adult Type 2 diabetes mellitus, often simply referred to as type 2 diabetes, is a long-lasting (chronic) disease. In type 2 diabetes, the pancreas does not make enough insulin (a hormone), the cells are less responsive to the insulin that is made (insulin resistance), or both. Normally, insulin moves sugars from food into the tissue cells. The tissue cells use the sugars for energy. The lack of insulin or the lack of normal response to insulin causes excess sugars to build up in the blood instead of going into the tissue cells. As a result, high blood sugar (hyperglycemia) develops. The effect of high sugar (glucose) levels can cause many complications. Type 2 diabetes was also previously called adult-onset diabetes but it can occur at any age.  RISK FACTORS  A person is predisposed to developing type 2 diabetes if someone in the family has the disease and also has one or more of the following primary risk factors:  Overweight.  An inactive lifestyle.  A history of consistently eating high-calorie foods. Maintaining a normal weight and regular physical activity can reduce the chance of developing type 2 diabetes. SYMPTOMS  A person with type 2 diabetes may not show symptoms initially. The symptoms of type 2 diabetes appear slowly. The symptoms include:  Increased thirst (polydipsia).  Increased urination (polyuria).  Increased urination during the night (nocturia).  Weight loss. This weight loss may be rapid.  Frequent, recurring infections.  Tiredness (fatigue).  Weakness.  Vision changes, such as blurred vision.  Fruity smell to your breath.  Abdominal pain.  Nausea or vomiting.  Cuts or bruises which are slow to heal.  Tingling or numbness in the hands or feet. DIAGNOSIS Type 2 diabetes is frequently not diagnosed until complications of diabetes are present. Type 2 diabetes is diagnosed when symptoms or complications are present and when blood  glucose levels are increased. Your blood glucose level may be checked by one or more of the following blood tests:  A fasting blood glucose test. You will not be allowed to eat for at least 8 hours before a blood sample is taken.  A random blood glucose test. Your blood glucose is checked at any time of the day regardless of when you ate.  A hemoglobin A1c blood glucose test. A hemoglobin A1c test provides information about blood glucose control over the previous 3 months.  An oral glucose tolerance test (OGTT). Your blood glucose is measured after you have not eaten (fasted) for 2 hours and then after you drink a glucose-containing beverage. TREATMENT   You may need to take insulin or diabetes medicine daily to keep blood glucose levels in the desired range.  You will need to match insulin dosing with exercise and healthy food choices. The treatment goal is to maintain the before meal blood sugar (preprandial glucose) level at 70 130 mg/dL. HOME CARE INSTRUCTIONS   Have your hemoglobin A1c level checked twice a year.  Perform daily blood glucose monitoring as directed by your caregiver.  Monitor urine ketones when you are ill and as directed by your caregiver.  Take your diabetes medicine or insulin as directed by your caregiver to maintain your blood glucose levels in the desired range.  Never run out of diabetes medicine or insulin. It is needed every day.  Adjust insulin based on your intake of carbohydrates. Carbohydrates can raise blood glucose levels but need to be included in your diet. Carbohydrates provide vitamins, minerals, and fiber which are an essential part of   a healthy diet. Carbohydrates are found in fruits, vegetables, whole grains, dairy products, legumes, and foods containing added sugars.    Eat healthy foods. Alternate 3 meals with 3 snacks.  Lose weight if overweight.  Carry a medical alert card or wear your medical alert jewelry.  Carry a 15 gram  carbohydrate snack with you at all times to treat low blood glucose (hypoglycemia). Some examples of 15 gram carbohydrate snacks include:  Glucose tablets, 3 or 4   Glucose gel, 15 gram tube  Raisins, 2 tablespoons (24 grams)  Jelly beans, 6  Animal crackers, 8  Regular pop, 4 ounces (120 mL)  Gummy treats, 9  Recognize hypoglycemia. Hypoglycemia occurs with blood glucose levels of 70 mg/dL and below. The risk for hypoglycemia increases when fasting or skipping meals, during or after intense exercise, and during sleep. Hypoglycemia symptoms can include:  Tremors or shakes.  Decreased ability to concentrate.  Sweating.  Increased heart rate.  Headache.  Dry mouth.  Hunger.  Irritability.  Anxiety.  Restless sleep.  Altered speech or coordination.  Confusion.  Treat hypoglycemia promptly. If you are alert and able to safely swallow, follow the 15:15 rule:  Take 15 20 grams of rapid-acting glucose or carbohydrate. Rapid-acting options include glucose gel, glucose tablets, or 4 ounces (120 mL) of fruit juice, regular soda, or low fat milk.  Check your blood glucose level 15 minutes after taking the glucose.  Take 15 20 grams more of glucose if the repeat blood glucose level is still 70 mg/dL or below.  Eat a meal or snack within 1 hour once blood glucose levels return to normal.    Be alert to polyuria and polydipsia which are early signs of hyperglycemia. An early awareness of hyperglycemia allows for prompt treatment. Treat hyperglycemia as directed by your caregiver.  Engage in at least 150 minutes of moderate-intensity physical activity a week, spread over at least 3 days of the week or as directed by your caregiver. In addition, you should engage in resistance exercise at least 2 times a week or as directed by your caregiver.  Adjust your medicine and food intake as needed if you start a new exercise or sport.  Follow your sick day plan at any time you  are unable to eat or drink as usual.  Avoid tobacco use.  Limit alcohol intake to no more than 1 drink per day for nonpregnant women and 2 drinks per day for men. You should drink alcohol only when you are also eating food. Talk with your caregiver whether alcohol is safe for you. Tell your caregiver if you drink alcohol several times a week.  Follow up with your caregiver regularly.  Schedule an eye exam soon after the diagnosis of type 2 diabetes and then annually.  Perform daily skin and foot care. Examine your skin and feet daily for cuts, bruises, redness, nail problems, bleeding, blisters, or sores. A foot exam by a caregiver should be done annually.  Brush your teeth and gums at least twice a day and floss at least once a day. Follow up with your dentist regularly.  Share your diabetes management plan with your workplace or school.  Stay up-to-date with immunizations.  Learn to manage stress.  Obtain ongoing diabetes education and support as needed.  Participate in, or seek rehabilitation as needed to maintain or improve independence and quality of life. Request a physical or occupational therapy referral if you are having foot or hand numbness or difficulties with grooming,   dressing, eating, or physical activity. SEEK MEDICAL CARE IF:   You are unable to eat food or drink fluids for more than 6 hours.  You have nausea and vomiting for more than 6 hours.  Your blood glucose level is over 240 mg/dL.  There is a change in mental status.  You develop an additional serious illness.  You have diarrhea for more than 6 hours.  You have been sick or have had a fever for a couple of days and are not getting better.  You have pain during any physical activity.  SEEK IMMEDIATE MEDICAL CARE IF:  You have difficulty breathing.  You have moderate to large ketone levels. MAKE SURE YOU:  Understand these instructions.  Will watch your condition.  Will get help right away if  you are not doing well or get worse. Document Released: 07/27/2005 Document Revised: 04/20/2012 Document Reviewed: 02/23/2012 [employer] Patient Information 2014 [employer].  

## 2013-06-21 NOTE — Progress Notes (Signed)
[facility] [address], [place] [address] [phone]  Date:  06/21/2013   Name:  Logan [name]   DOB:  [birth year]   MRN:  [id number]  PCP:  No primary provider on file.    Chief Complaint: Annual Exam, Rash and Medication Refill   History of Present Illness:  Logan [name] is a 42 y.o. very pleasant male patient who presents with the following:  Has been uninsured and just got his insurance reinstated.  He is tolerating his medication and has no adverse effects of treatment.  Denies other complaint or health concern today.   Patient Active Problem List   Diagnosis Date Noted  . Diabetes 05/31/2012  . Hypertension 05/31/2012    Past Medical History  Diagnosis Date  . Diabetes mellitus   . Hypertension   . Allergy   . Hernia, umbilical     History reviewed. No pertinent past surgical history.  History  Substance Use Topics  . Smoking status: Never Smoker   . Smokeless tobacco: Not on file  . Alcohol Use: No    Family History  Problem Relation Age of Onset  . Hypertension Mother   . Diverticulosis Mother   . Osteoporosis Mother   . Diabetes Father   . Alcohol abuse Father   . Osteoporosis Sister   . Hypertension Sister   . Diabetes Brother   . Hypertension Brother   . Hypertension Maternal Grandmother   . Heart disease Maternal Grandfather   . Diabetes Paternal Grandfather   . Diabetes Brother   . Hypertension Brother   . Heart attack Brother   . Osteoporosis Sister     Allergies  Allergen Reactions  . Penicillins     Medication list has been reviewed and updated.  Current Outpatient Prescriptions on File Prior to Visit  Medication Sig Dispense Refill  . lisinopril (PRINIVIL,ZESTRIL) 10 MG tablet Take 1 tablet (10 mg total) by mouth daily. PATIENT NEEDS OFFICE VISIT FOR ADDITIONAL REFILLS  30 tablet  0  . loratadine (CLARITIN) 10 MG tablet Take 10 mg by mouth daily.      . metFORMIN (GLUCOPHAGE) 500 MG tablet Take  500-1,000 mg by mouth 3 (three) times daily. 2 tabs in am, 1 tab in noon, and 1 tab in pm.      . fluticasone (FLONASE) 50 MCG/ACT nasal spray Place 2 sprays into the nose daily.  16 g  6  . metFORMIN (GLUCOPHAGE) 500 MG tablet Take 2 tablets (1,000 mg total) by mouth 2 (two) times daily with a meal.  90 tablet  2  . vitamin C (ASCORBIC ACID) 500 MG tablet Take 500 mg by mouth daily.       No current facility-administered medications on file prior to visit.    Review of Systems:  As per HPI, otherwise negative.    Physical Examination: There were no vitals filed for this visit. There were no vitals filed for this visit. There is no weight on file to calculate BMI. Ideal Body Weight:    GEN: WDWN, NAD, Non-toxic, A & O x 3 HEENT: Atraumatic, Normocephalic. Neck supple. No masses, No LAD. Ears and Nose: No external deformity. CV: RRR, No M/G/R. No JVD. No thrill. No extra heart sounds. PULM: CTA B, no wheezes, crackles, rhonchi. No retractions. No resp. distress. No accessory muscle use. ABD: S, NT, ND, +BS. No rebound. No HSM. EXTR: No c/c/e NEURO Normal gait.  PSYCH: Normally interactive. Conversant. Not depressed or anxious appearing.  Calm demeanor.  SKIN:  Psoriasis    Assessment and Plan: NIDDM with poor control ASA Continue meds Labs Psoriasis    Signed,  Phillips Odor, MD   Results for orders placed in visit on 06/21/13  POCT GLYCOSYLATED HEMOGLOBIN (HGB A1C)      Result Value Range   Hemoglobin A1C 7.9    POCT CBC      Result Value Range   WBC 10.7 (*) 4.6 - 10.2 K/uL   Lymph, poc 3.5 (*) 0.6 - 3.4   POC LYMPH PERCENT 32.8  10 - 50 %L   MID (cbc) 0.8  0 - 0.9   POC MID % 7.8  0 - 12 %M   POC Granulocyte 6.4  2 - 6.9   Granulocyte percent 59.4  37 - 80 %G   RBC 5.86  4.69 - 6.13 M/uL   Hemoglobin 17.2  14.1 - 18.1 g/dL   HCT, POC 16.1 (*) 09.6 - 53.7 %   MCV 92.2  80 - 97 fL   MCH, POC 29.4  27 - 31.2 pg   MCHC 31.9  31.8 - 35.4 g/dL   RDW, POC  04.5     Platelet Count, POC 504 (*) 142 - 424 K/uL   MPV 9.9  0 - 99.8 fL  POCT URINALYSIS DIPSTICK      Result Value Range   Color, UA yellow     Clarity, UA clear     Glucose, UA neg     Bilirubin, UA neg     Ketones, UA neg     Spec Grav, UA 1.025     Blood, UA neg     pH, UA 5.0     Protein, UA trace     Urobilinogen, UA 0.2     Nitrite, UA neg     Leukocytes, UA Negative

## 2013-06-22 MED ORDER — ATORVASTATIN CALCIUM 20 MG PO TABS: 20.0000 mg | ORAL_TABLET | Freq: Every day | ORAL | Status: DC

## 2013-06-22 NOTE — Addendum Note (Signed)
Addended by: Carmelina Dane on: 06/22/2013 04:21 PM   Modules accepted: Orders

## 2013-06-26 ENCOUNTER — Telehealth: Payer: Self-pay

## 2013-06-26 NOTE — Telephone Encounter (Signed)
Pt is calling to get lab results.

## 2013-06-26 NOTE — Telephone Encounter (Signed)
Notes Recorded by Phillips Odor, MD on 06/22/2013 at 4:21 PM Let him know his lipids are up and I sent in lipitor. Follow up in 3 months See lab. Left message

## 2013-07-05 ENCOUNTER — Ambulatory Visit: Payer: BC Managed Care – PPO | Admitting: Family Medicine

## 2013-11-13 ENCOUNTER — Encounter (HOSPITAL_BASED_OUTPATIENT_CLINIC_OR_DEPARTMENT_OTHER): Payer: Self-pay | Admitting: Emergency Medicine

## 2013-11-13 ENCOUNTER — Emergency Department (HOSPITAL_BASED_OUTPATIENT_CLINIC_OR_DEPARTMENT_OTHER): Payer: No Typology Code available for payment source

## 2013-11-13 ENCOUNTER — Emergency Department (HOSPITAL_BASED_OUTPATIENT_CLINIC_OR_DEPARTMENT_OTHER)
Admission: EM | Admit: 2013-11-13 | Discharge: 2013-11-13 | Disposition: A | Discharge status: Home / Self Care | Payer: No Typology Code available for payment source | Attending: Emergency Medicine | Admitting: Emergency Medicine

## 2013-11-13 DIAGNOSIS — Y9389 Activity, other specified: Secondary | ICD-10-CM | POA: Insufficient documentation

## 2013-11-13 DIAGNOSIS — S29019A Strain of muscle and tendon of unspecified wall of thorax, initial encounter: Secondary | ICD-10-CM

## 2013-11-13 DIAGNOSIS — Y92410 Unspecified street and highway as the place of occurrence of the external cause: Secondary | ICD-10-CM | POA: Insufficient documentation

## 2013-11-13 DIAGNOSIS — S239XXA Sprain of unspecified parts of thorax, initial encounter: Secondary | ICD-10-CM | POA: Insufficient documentation

## 2013-11-13 DIAGNOSIS — Z760 Encounter for issue of repeat prescription: Secondary | ICD-10-CM | POA: Insufficient documentation

## 2013-11-13 DIAGNOSIS — Z880 Allergy status to penicillin: Secondary | ICD-10-CM | POA: Insufficient documentation

## 2013-11-13 DIAGNOSIS — Z8719 Personal history of other diseases of the digestive system: Secondary | ICD-10-CM | POA: Insufficient documentation

## 2013-11-13 DIAGNOSIS — S59919A Unspecified injury of unspecified forearm, initial encounter: Secondary | ICD-10-CM

## 2013-11-13 DIAGNOSIS — I10 Essential (primary) hypertension: Secondary | ICD-10-CM | POA: Insufficient documentation

## 2013-11-13 DIAGNOSIS — S6990XA Unspecified injury of unspecified wrist, hand and finger(s), initial encounter: Secondary | ICD-10-CM | POA: Insufficient documentation

## 2013-11-13 DIAGNOSIS — IMO0002 Reserved for concepts with insufficient information to code with codable children: Secondary | ICD-10-CM | POA: Insufficient documentation

## 2013-11-13 DIAGNOSIS — E119 Type 2 diabetes mellitus without complications: Secondary | ICD-10-CM | POA: Insufficient documentation

## 2013-11-13 DIAGNOSIS — S59909A Unspecified injury of unspecified elbow, initial encounter: Secondary | ICD-10-CM | POA: Insufficient documentation

## 2013-11-13 DIAGNOSIS — Z79899 Other long term (current) drug therapy: Secondary | ICD-10-CM | POA: Insufficient documentation

## 2013-11-13 MED ORDER — LISINOPRIL 10 MG PO TABS: 10.0000 mg | ORAL_TABLET | Freq: Every day | ORAL | Status: DC

## 2013-11-13 MED ORDER — METFORMIN HCL ER 500 MG PO TB24: 2000.0000 mg | ORAL_TABLET | Freq: Every day | ORAL | Status: DC
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
## 2014-01-23 ENCOUNTER — Other Ambulatory Visit: Payer: Self-pay

## 2014-01-23 MED ORDER — METFORMIN HCL ER 500 MG PO TB24: 2000.0000 mg | ORAL_TABLET | Freq: Every day | ORAL | Status: DC

## 2014-01-23 MED ORDER — LISINOPRIL 10 MG PO TABS: 10.0000 mg | ORAL_TABLET | Freq: Every day | ORAL | Status: DC

## 2014-02-20 ENCOUNTER — Ambulatory Visit (INDEPENDENT_AMBULATORY_CARE_PROVIDER_SITE_OTHER): Payer: PRIVATE HEALTH INSURANCE | Admitting: Physician Assistant

## 2014-02-20 VITALS — BP 116/72 | HR 93 | Temp 98.2°F | Resp 16 | Ht 64.0 in | Wt 176.0 lb

## 2014-02-20 DIAGNOSIS — E1165 Type 2 diabetes mellitus with hyperglycemia: Secondary | ICD-10-CM

## 2014-02-20 DIAGNOSIS — I10 Essential (primary) hypertension: Secondary | ICD-10-CM

## 2014-02-20 DIAGNOSIS — IMO0002 Reserved for concepts with insufficient information to code with codable children: Secondary | ICD-10-CM

## 2014-02-20 DIAGNOSIS — R21 Rash and other nonspecific skin eruption: Secondary | ICD-10-CM

## 2014-02-20 DIAGNOSIS — IMO0001 Reserved for inherently not codable concepts without codable children: Secondary | ICD-10-CM

## 2014-02-20 DIAGNOSIS — E119 Type 2 diabetes mellitus without complications: Secondary | ICD-10-CM

## 2014-02-20 LAB — POCT GLYCOSYLATED HEMOGLOBIN (HGB A1C): Hemoglobin A1C: 13

## 2014-02-20 LAB — GLUCOSE, POCT (MANUAL RESULT ENTRY): POC Glucose: 270 mg/dl — AB (ref 70–99)

## 2014-02-20 MED ORDER — SITAGLIPTIN PHOS-METFORMIN HCL 50-1000 MG PO TABS: 1.0000 | ORAL_TABLET | Freq: Two times a day (BID) | ORAL | Status: DC

## 2014-02-20 MED ORDER — BETAMETHASONE DIPROPIONATE AUG 0.05 % EX OINT: TOPICAL_OINTMENT | Freq: Two times a day (BID) | CUTANEOUS | Status: DC

## 2014-02-20 MED ORDER — CLOBETASOL PROPIONATE 0.05 % EX FOAM: Freq: Two times a day (BID) | CUTANEOUS | Status: DC

## 2014-02-20 NOTE — Progress Notes (Signed)
   Subjective:    Patient ID: Logan [name], male    DOB: [birth year], 43 y.o.   MRN: [id number]  HPI Pt presents to clinic for recheck of his DM and HTN.  His diet has worsened recently and he has noticed that his sugars at home are running higher than normal - he is eating more pastas and rice.  His lowest sugars have been in the hundreds - most readings are 260s.  He is also concerned that the metformin is causing a rash on his face and scalp and back - it is not really itchy but does look terrible because it is flaky all the time.  He tried triamcinolone 0.1% cream intermittently and it did not seem to help at all so he stopped using it.  He went to dermatology and they did not change medications and he was frustrated because he still has the rash.   Review of Systems  Constitutional: Negative for fever and chills.  Skin: Positive for rash.  Neurological:       No paresthesias       Objective:   Physical Exam  Vitals reviewed. Constitutional: He is oriented to person, place, and time. He appears well-developed and well-nourished.  HENT:  Head: Normocephalic and atraumatic.  Right Ear: External ear normal.  Left Ear: External ear normal.  Eyes: Conjunctivae are normal.  Cardiovascular: Normal rate, regular rhythm and normal heart sounds.   No murmur heard. Pulmonary/Chest: Effort normal and breath sounds normal. He has no wheezes.  Neurological: He is alert and oriented to person, place, and time.  Normal DM foot exam  Skin: Skin is warm and dry.  Erythematous plaques with silvery flakes on scalp, behind ears and back - elbows and knees are spared - see photo media  Psychiatric: He has a normal mood and affect. His behavior is normal. Judgment and thought content normal.   Results for orders placed in visit on 02/20/14  POCT GLYCOSYLATED HEMOGLOBIN (HGB A1C)      Result Value Ref Range   Hemoglobin A1C 13.0    GLUCOSE, POCT (MANUAL RESULT ENTRY)      Result Value Ref Range   POC Glucose 270 (*) 70 - 99 mg/dl       Assessment & Plan:  Type 2 diabetes mellitus, uncontrolled - D/w pt healthier eating practices and food to pare to improve glycemic control.  We will add to his metformin and recheck in 3 months, with improved eating habits and medications I expect him to be improved.  Plan: POCT glycosylated hemoglobin (Hb A1C), Lipid panel, Microalbumin, urine, HM DIABETES FOOT EXAM, sitaGLIPtin-metformin (JANUMET) 50-1000 MG per tablet, POCT glucose (manual entry)  Essential hypertension - Well controlled.  Plan: COMPLETE METABOLIC PANEL WITH GFR  Rash - This is suspicious for psoriasis and we will treat him with topical treatments today.  If he does not improve we will send to dermatology for further treatment.  We did do a biopsy for definitve diagnosis but I doubt that it is from his Metformin.  Plan: Dermatology pathology, clobetasol (OLUX) 0.05 % topical foam, augmented betamethasone dipropionate (DIPROLENE) 0.05 % ointment  Windell Hummingbird PA-C  [facility] 02/20/2014 4:00 PM

## 2014-02-20 NOTE — Patient Instructions (Signed)
I will contact you with your lab results as soon as they are available.   If you have not heard from me in 2 weeks, please contact me.  The fastest way to get your results is to register for My Chart (see the instructions on the last page of this printout).   

## 2014-02-21 LAB — COMPLETE METABOLIC PANEL WITH GFR
ALT: 41 U/L (ref 0–53)
AST: 20 U/L (ref 0–37)
Albumin: 5 g/dL (ref 3.5–5.2)
Alkaline Phosphatase: 110 U/L (ref 39–117)
BUN: 15 mg/dL (ref 6–23)
CO2: 25 mEq/L (ref 19–32)
Calcium: 10.1 mg/dL (ref 8.4–10.5)
Chloride: 99 mEq/L (ref 96–112)
Creat: 0.75 mg/dL (ref 0.50–1.35)
GFR, Est African American: >89 mL/min
GFR, Est Non African American: >89 mL/min
Glucose, Bld: 260 mg/dL — ABNORMAL HIGH (ref 70–99)
Potassium: 4.9 mEq/L (ref 3.5–5.3)
Sodium: 135 mEq/L (ref 135–145)
Total Bilirubin: 0.5 mg/dL (ref 0.2–1.2)
Total Protein: 7.9 g/dL (ref 6.0–8.3)

## 2014-02-21 LAB — LIPID PANEL
Cholesterol: 230 mg/dL — ABNORMAL HIGH (ref 0–200)
HDL: 42 mg/dL (ref >39)
LDL Cholesterol: 161 mg/dL — ABNORMAL HIGH (ref 0–99)
Total CHOL/HDL Ratio: 5.5 Ratio
Triglycerides: 134 mg/dL (ref <150)
VLDL: 27 mg/dL (ref 0–40)

## 2014-02-21 LAB — MICROALBUMIN, URINE: Microalb, Ur: 1.65 mg/dL (ref 0.00–1.89)

## 2014-02-24 ENCOUNTER — Encounter: Payer: Self-pay | Admitting: Physician Assistant

## 2014-03-18 ENCOUNTER — Other Ambulatory Visit: Payer: Self-pay | Admitting: Emergency Medicine

## 2014-03-19 ENCOUNTER — Other Ambulatory Visit: Payer: Self-pay | Admitting: Emergency Medicine

## 2014-03-19 ENCOUNTER — Other Ambulatory Visit: Payer: Self-pay

## 2014-03-19 MED ORDER — LISINOPRIL 10 MG PO TABS: 10.0000 mg | ORAL_TABLET | Freq: Every day | ORAL | Status: DC

## 2014-05-14 ENCOUNTER — Telehealth: Payer: Self-pay

## 2014-05-14 NOTE — Telephone Encounter (Signed)
Pt called in and wants to know if there is another medication that is cheaper then Janumet. He does not want Invocanna. He uses [employer] claims handler on [address]. He can be reached @ [phone]. Thank you

## 2014-05-15 NOTE — Telephone Encounter (Signed)
For his level of diabetes there is probably not a cheaper medication.  It is time for his recheck - this would be a good time to recheck for this discussion.

## 2014-05-17 NOTE — Telephone Encounter (Signed)
Lmom on both #s to CB. Give pt Logan [name]'s message, but also have savings card in p/up drawer for pt which should work w/pt's ins.

## 2014-05-18 ENCOUNTER — Other Ambulatory Visit: Payer: Self-pay | Admitting: Physician Assistant

## 2014-05-18 NOTE — Telephone Encounter (Signed)
I do not see the report. Can you get it for me again.

## 2014-05-18 NOTE — Telephone Encounter (Signed)
Pt called and I gave him message and he will come p/up savings card which will help greatly. Also printed labs from July visit and put in envelope. Pt also asked about pathology report and we do not have any record of the results in EPIC that I can find. Called [facility] and they are re-faxing copy of results. Received results and put at [facility] computer for review.

## 2014-05-18 NOTE — Telephone Encounter (Signed)
Rash is likely psoriasis - would he like to see a dermatologist.

## 2014-05-18 NOTE — Telephone Encounter (Signed)
Located report and gave to Judson Roch.

## 2014-05-19 NOTE — Telephone Encounter (Signed)
Lmom on both #s to CB

## 2014-05-20 NOTE — Telephone Encounter (Signed)
Lmom on both #s to CB

## 2014-05-21 NOTE — Telephone Encounter (Signed)
Spoke to pt. He was calling regarding his Janumet rx. He was not aware that we sent him a refill on 05/19/14. I told him he needs an OV before he gets another refill. He plans on returning to the clinic to see Judson Roch on 06/05/14. I also informed him that his rash was most likely psoriasis. Pt said the medication for his rash is helping, but he would like to see a dermatologist. I told him he can discuss the dermatologist referral when he meets with Judson Roch. Pt would also like to discuss different options for his diabetes medication.

## 2014-06-19 ENCOUNTER — Other Ambulatory Visit: Payer: Self-pay | Admitting: Physician Assistant

## 2014-06-20 ENCOUNTER — Other Ambulatory Visit: Payer: Self-pay | Admitting: Physician Assistant

## 2014-07-19 ENCOUNTER — Other Ambulatory Visit: Payer: Self-pay | Admitting: Physician Assistant

## 2014-07-20 ENCOUNTER — Ambulatory Visit (INDEPENDENT_AMBULATORY_CARE_PROVIDER_SITE_OTHER): Payer: PRIVATE HEALTH INSURANCE | Admitting: Family Medicine

## 2014-07-20 ENCOUNTER — Telehealth: Payer: Self-pay | Admitting: Physician Assistant

## 2014-07-20 VITALS — BP 112/74 | HR 99 | Temp 98.2°F | Resp 18 | Ht 64.0 in | Wt 179.0 lb

## 2014-07-20 DIAGNOSIS — I10 Essential (primary) hypertension: Secondary | ICD-10-CM

## 2014-07-20 DIAGNOSIS — R21 Rash and other nonspecific skin eruption: Secondary | ICD-10-CM

## 2014-07-20 DIAGNOSIS — E1165 Type 2 diabetes mellitus with hyperglycemia: Secondary | ICD-10-CM

## 2014-07-20 DIAGNOSIS — L309 Dermatitis, unspecified: Secondary | ICD-10-CM

## 2014-07-20 DIAGNOSIS — IMO0002 Reserved for concepts with insufficient information to code with codable children: Secondary | ICD-10-CM

## 2014-07-20 DIAGNOSIS — Z23 Encounter for immunization: Secondary | ICD-10-CM

## 2014-07-20 LAB — LIPID PANEL
Cholesterol: 208 mg/dL — ABNORMAL HIGH (ref 0–200)
HDL: 43 mg/dL (ref >39)
LDL Cholesterol: 122 mg/dL — ABNORMAL HIGH (ref 0–99)
Total CHOL/HDL Ratio: 4.8 Ratio
Triglycerides: 213 mg/dL — ABNORMAL HIGH (ref <150)
VLDL: 43 mg/dL — ABNORMAL HIGH (ref 0–40)

## 2014-07-20 LAB — POCT GLYCOSYLATED HEMOGLOBIN (HGB A1C): Hemoglobin A1C: 10.9

## 2014-07-20 LAB — COMPREHENSIVE METABOLIC PANEL
ALT: 52 U/L (ref 0–53)
AST: 25 U/L (ref 0–37)
Albumin: 4.7 g/dL (ref 3.5–5.2)
Alkaline Phosphatase: 101 U/L (ref 39–117)
BUN: 15 mg/dL (ref 6–23)
CO2: 24 mEq/L (ref 19–32)
Calcium: 9.3 mg/dL (ref 8.4–10.5)
Chloride: 101 mEq/L (ref 96–112)
Creat: 0.96 mg/dL (ref 0.50–1.35)
Glucose, Bld: 229 mg/dL — ABNORMAL HIGH (ref 70–99)
Potassium: 4.6 mEq/L (ref 3.5–5.3)
Sodium: 137 mEq/L (ref 135–145)
Total Bilirubin: 0.4 mg/dL (ref 0.2–1.2)
Total Protein: 7.4 g/dL (ref 6.0–8.3)

## 2014-07-20 LAB — GLUCOSE, POCT (MANUAL RESULT ENTRY): POC Glucose: 217 mg/dl — AB (ref 70–99)

## 2014-07-20 MED ORDER — SITAGLIPTIN PHOS-METFORMIN HCL 50-1000 MG PO TABS: 1.0000 | ORAL_TABLET | Freq: Two times a day (BID) | ORAL | Status: DC

## 2014-07-20 MED ORDER — LISINOPRIL 10 MG PO TABS: 10.0000 mg | ORAL_TABLET | Freq: Every day | ORAL | Status: DC

## 2014-07-20 MED ORDER — CANAGLIFLOZIN 100 MG PO TABS: 100.0000 mg | ORAL_TABLET | Freq: Every day | ORAL | Status: DC

## 2014-07-20 MED ORDER — CLOBETASOL PROPIONATE 0.05 % EX FOAM: Freq: Two times a day (BID) | CUTANEOUS | Status: DC

## 2014-07-20 MED ORDER — BETAMETHASONE DIPROPIONATE AUG 0.05 % EX OINT: TOPICAL_OINTMENT | Freq: Two times a day (BID) | CUTANEOUS | Status: DC

## 2014-07-20 NOTE — Patient Instructions (Addendum)
Read the American diabetic Association website  Begin Invokana 100 mg each morning  Continue current medications  Try not to lean on the right shoulder excessively. When having pain, take 2 Aleve twice daily on an as-needed basis. Do not do this continuously.  Return in 3 months for a recheck and repeat labs.  Referral is being made to nutrition and counseling for the diabetes. If you do not hear from our referrals desk over the next week please call back and ask if this has been made.

## 2014-07-20 NOTE — Progress Notes (Signed)
Subjective: 43 year old man who is here for recheck with regard to his diabetes. He has been generally doing pretty well. He admits that he doesn't get as much exercise or watch his diet as well as he should, but he thinks his diabetes is probably doing fairly well. He takes the [employer]. He also has been taking cinnamon is someone suggested he try. He does not smoke, rarely drinks. He is single and fixes most of his own meals. 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 He does have some right shoulder pain. No other major complaints.  Chronic right shoulder pain. He leans on the right arm a lot. He likes to lay on the couch on it also.  Objective:  Results for orders placed or performed in visit on 07/20/14  POCT glucose (manual entry)  Result Value Ref Range   POC Glucose 217 (A) 70 - 99 mg/dl  POCT glycosylated hemoglobin (Hb A1C)  Result Value Ref Range   Hemoglobin A1C 10.9    No acute distress. Chest clear. Heart regular without murmurs. Abdomen soft without mass or tenderness. His right shoulder has had a chronic pain is good strength and good range of motion. No point tenderness.  Assessment: Diabetes uncontrolled Overweight Chronic right shoulder strain  Plan: Try not to lean on the right arm excessively. Take Aleve when necessary for pain We will let you know the results of his labs. Begin Invokana. Had a long discussion with him about it. He is a little reluctant because he has heard some bad things about it. Discussed these things.

## 2014-07-22 ENCOUNTER — Encounter: Payer: Self-pay | Admitting: Family Medicine

## 2014-07-30 NOTE — Telephone Encounter (Signed)
Pt dropped off ins form from employer to be completed from last pe,he Also needs to know if he is suppose to be taking his old diabetic rx Along with the new,he has been doing this but wants to make sure hes correct???   Best phone for pt is [phone]

## 2014-08-01 NOTE — Telephone Encounter (Signed)
LMOM that Dr. Linna Darner advised that it is ok to take both meds at the same time and that his form is ready for p/u. Pt needs waist circumference done when he comes to get this.

## 2014-08-02 ENCOUNTER — Encounter: Payer: Self-pay | Admitting: Family Medicine

## 2014-08-19 ENCOUNTER — Other Ambulatory Visit: Payer: Self-pay | Admitting: Family Medicine

## 2014-09-22 ENCOUNTER — Other Ambulatory Visit: Payer: Self-pay | Admitting: Family Medicine

## 2014-10-22 ENCOUNTER — Telehealth: Payer: Self-pay

## 2014-10-22 DIAGNOSIS — E1165 Type 2 diabetes mellitus with hyperglycemia: Secondary | ICD-10-CM

## 2014-10-22 DIAGNOSIS — IMO0002 Reserved for concepts with insufficient information to code with codable children: Secondary | ICD-10-CM

## 2014-10-22 NOTE — Telephone Encounter (Signed)
Pt is requesting a refill of Janumet, and Invokana. He will RTC later this month for a f/u.

## 2014-10-22 NOTE — Telephone Encounter (Signed)
Pt called to check on previous request. Thank you

## 2014-10-23 MED ORDER — SITAGLIPTIN PHOS-METFORMIN HCL 50-1000 MG PO TABS: 1.0000 | ORAL_TABLET | Freq: Two times a day (BID) | ORAL | Status: DC

## 2014-10-23 MED ORDER — CANAGLIFLOZIN 100 MG PO TABS: 100.0000 mg | ORAL_TABLET | Freq: Every day | ORAL | Status: DC

## 2014-10-23 NOTE — Telephone Encounter (Signed)
A #30 day supply of Rx sent. He needs appointment for additional refills.

## 2014-10-24 ENCOUNTER — Other Ambulatory Visit: Payer: Self-pay | Admitting: Emergency Medicine

## 2014-11-20 ENCOUNTER — Ambulatory Visit (INDEPENDENT_AMBULATORY_CARE_PROVIDER_SITE_OTHER): Payer: BLUE CROSS/BLUE SHIELD | Admitting: Family Medicine

## 2014-11-20 VITALS — BP 120/84 | HR 80 | Temp 98.1°F | Resp 18 | Ht 63.0 in | Wt 186.2 lb

## 2014-11-20 DIAGNOSIS — Z13220 Encounter for screening for lipoid disorders: Secondary | ICD-10-CM | POA: Diagnosis not present

## 2014-11-20 DIAGNOSIS — Z119 Encounter for screening for infectious and parasitic diseases, unspecified: Secondary | ICD-10-CM | POA: Diagnosis not present

## 2014-11-20 DIAGNOSIS — Z23 Encounter for immunization: Secondary | ICD-10-CM

## 2014-11-20 DIAGNOSIS — Z0000 Encounter for general adult medical examination without abnormal findings: Secondary | ICD-10-CM

## 2014-11-20 DIAGNOSIS — R21 Rash and other nonspecific skin eruption: Secondary | ICD-10-CM

## 2014-11-20 DIAGNOSIS — E1165 Type 2 diabetes mellitus with hyperglycemia: Secondary | ICD-10-CM | POA: Diagnosis not present

## 2014-11-20 DIAGNOSIS — K429 Umbilical hernia without obstruction or gangrene: Secondary | ICD-10-CM

## 2014-11-20 DIAGNOSIS — I10 Essential (primary) hypertension: Secondary | ICD-10-CM

## 2014-11-20 DIAGNOSIS — Z130 Encounter for screening for diseases of the blood and blood-forming organs and certain disorders involving the immune mechanism: Secondary | ICD-10-CM

## 2014-11-20 DIAGNOSIS — L409 Psoriasis, unspecified: Secondary | ICD-10-CM | POA: Diagnosis not present

## 2014-11-20 DIAGNOSIS — IMO0002 Reserved for concepts with insufficient information to code with codable children: Secondary | ICD-10-CM

## 2014-11-20 LAB — POCT GLYCOSYLATED HEMOGLOBIN (HGB A1C): Hemoglobin A1C: 6.7

## 2014-11-20 MED ORDER — BETAMETHASONE DIPROPIONATE AUG 0.05 % EX OINT: TOPICAL_OINTMENT | Freq: Two times a day (BID) | CUTANEOUS | Status: DC

## 2014-11-20 MED ORDER — CANAGLIFLOZIN 100 MG PO TABS: 100.0000 mg | ORAL_TABLET | Freq: Every day | ORAL | Status: DC

## 2014-11-20 MED ORDER — SITAGLIPTIN PHOS-METFORMIN HCL 50-1000 MG PO TABS: 1.0000 | ORAL_TABLET | Freq: Two times a day (BID) | ORAL | Status: DC

## 2014-11-20 MED ORDER — LISINOPRIL 10 MG PO TABS: 10.0000 mg | ORAL_TABLET | Freq: Every day | ORAL | Status: DC

## 2014-11-20 MED ORDER — CLOBETASOL PROPIONATE 0.05 % EX FOAM: Freq: Two times a day (BID) | CUTANEOUS | Status: DC

## 2014-11-20 NOTE — Progress Notes (Signed)
[facility] [address] [place] [address] [phone]  Date:  11/20/2014   Name:  Logan [name]   DOB:  November 05, 1970   MRN:  [id number]  PCP:  No primary care provider on file.    Chief Complaint: Annual Exam and Medication Refill   History of Present Illness:  Logan [name] is a 44 y.o. very pleasant male patient who presents with the following:  Here today for a CPE.  He also needs his medications refilled.   We have been working on his diabetes- he was dx 6-7 years ago.   He had been on sitagliptin/metformin, added invokana about 4 months ago.   He does check his sugar at home- he may get to the 90s/ low 100s.   He has added exercise, walking.  He has not lost weight but has tried to build some muscle through exercise.   He has not yet been able to get diabetes education lined up due to his schedule He is fasting today.    He is not aware of the date of his last tetanus shot- at least 10 years ago.  He does not think he ever had a pneumonia vaccine.  He also was noted to have psoriasis on a skin bx last year.  He did not seem aware of this dx.  He uses steroid products as needed for his skin and scalp sx  He notes an umbilical hernia which has been present for a few years and is bothering him more now.  He is interested in having this surgically repaired.    Wt Readings from Last 3 Encounters:  11/20/14 186 lb 3.2 oz (84.46 kg)  07/20/14 179 lb (81.194 kg)  02/20/14 176 lb (79.833 kg)    Lab Results  Component Value Date   HGBA1C 10.9 07/20/2014     Patient Active Problem List   Diagnosis Date Noted  . Diabetes 05/31/2012  . Hypertension 05/31/2012    Past Medical History  Diagnosis Date  . Diabetes mellitus   . Hypertension   . Allergy   . Hernia, umbilical     History reviewed. No pertinent past surgical history.  History  Substance Use Topics  . Smoking status: Never Smoker   . Smokeless tobacco: Not on file  . Alcohol  Use: No    Family History  Problem Relation Age of Onset  . Hypertension Mother   . Diverticulosis Mother   . Osteoporosis Mother   . Diabetes Father   . Alcohol abuse Father   . Osteoporosis Sister   . Hypertension Sister   . Diabetes Brother   . Hypertension Brother   . Hypertension Maternal Grandmother   . Heart disease Maternal Grandfather   . Diabetes Paternal Grandfather   . Diabetes Brother   . Hypertension Brother   . Heart attack Brother   . Osteoporosis Sister     Allergies  Allergen Reactions  . Penicillins     Medication list has been reviewed and updated.  Current Outpatient Prescriptions on File Prior to Visit  Medication Sig Dispense Refill  . augmented betamethasone dipropionate (DIPROLENE) 0.05 % ointment Apply topically 2 (two) times daily. 50 g 1  . canagliflozin (INVOKANA) 100 MG TABS tablet Take 1 tablet (100 mg total) by mouth daily. 30 tablet 0  . clobetasol (OLUX) 0.05 % topical foam Apply topically 2 (two) times daily. PATIENT NEEDS OFFICE VISIT FOR ADDITIONAL REFILLS 100 g 0  . lisinopril (PRINIVIL,ZESTRIL) 10 MG  tablet Take 1 tablet (10 mg total) by mouth daily. 30 tablet 5  . loratadine (CLARITIN) 10 MG tablet Take 10 mg by mouth daily.    . metFORMIN (GLUCOPHAGE-XR) 500 MG 24 hr tablet Take 4 tablets (2,000 mg total) by mouth daily with breakfast. 120 tablet 4  . sitaGLIPtin-metformin (JANUMET) 50-1000 MG per tablet Take 1 tablet by mouth 2 (two) times daily with a meal. 60 tablet 0  . triamcinolone cream (KENALOG) 0.1 % Apply 1 application topically 2 (two) times daily. 454 g 0  . vitamin C (ASCORBIC ACID) 500 MG tablet Take 500 mg by mouth daily.     No current facility-administered medications on file prior to visit.    Review of Systems:  As per HPI- otherwise negative.   Physical Examination: Filed Vitals:   11/20/14 1108  BP: 120/84  Pulse: 80  Temp: 98.1 F (36.7 C)  Resp: 18   Filed Vitals:   11/20/14 1108  Height: 5'  3" (1.6 m)  Weight: 186 lb 3.2 oz (84.46 kg)   Body mass index is 32.99 kg/(m^2). Ideal Body Weight: Weight in (lb) to have BMI = 25: 140.8  GEN: WDWN, NAD, Non-toxic, A & O x 3, obese but OW looks well HEENT: Atraumatic, Normocephalic. Neck supple. No masses, No LAD.  Bilateral TM wnl, oropharynx normal.  PEERL,EOMI.   Ears and Nose: No external deformity. CV: RRR, No M/G/R. No JVD. No thrill. No extra heart sounds. PULM: CTA B, no wheezes, crackles, rhonchi. No retractions. No resp. distress. No accessory muscle use. ABD: S, NT, ND. No rebound. No HSM.  Small umbilical hernia which he states is getting larger EXTR: No c/c/e NEURO Normal gait.  PSYCH: Normally interactive. Conversant. Not depressed or anxious appearing.  Calm demeanor.  GU: normal testes and penis  Scattered small plaque psoriasis over his back and legs   Results for orders placed or performed in visit on 11/20/14  POCT glycosylated hemoglobin (Hb A1C)  Result Value Ref Range   Hemoglobin A1C 6.7    Assessment and Plan: Physical exam  Umbilical hernia without obstruction and without gangrene - Plan: Ambulatory referral to General Surgery  Screening examination for infectious disease - Plan: Varicella zoster antibody, IgG, HIV antibody  Immunization due - Plan: Tdap vaccine greater than or equal to 7yo IM, Pneumococcal polysaccharide vaccine 23-valent greater than or equal to 2yo subcutaneous/IM  Screening for hyperlipidemia - Plan: Lipid panel  Diabetes mellitus type 2, uncontrolled - Plan: Comprehensive metabolic panel, POCT glycosylated hemoglobin (Hb A1C)  Screening for deficiency anemia - Plan: CBC  Type 2 diabetes mellitus, uncontrolled - Plan: sitaGLIPtin-metformin (JANUMET) 50-1000 MG per tablet, canagliflozin (INVOKANA) 100 MG TABS tablet  Essential hypertension - Plan: lisinopril (PRINIVIL,ZESTRIL) 10 MG tablet  Rash - Plan: augmented betamethasone dipropionate (DIPROLENE) 0.05 %  ointment  Psoriasis - Plan: clobetasol (OLUX) 0.05 % topical foam, augmented betamethasone dipropionate (DIPROLENE) 0.05 % ointment  Labs pending as above Advised that his DM control is much better!  Continue current regimen.  He is concerned that he never had chicken pox- will check varicella titer Immunizations updated Refilled his medications  Signed Lamar Blinks, MD

## 2014-11-20 NOTE — Patient Instructions (Signed)
I will be in touch with your labs asap Set up mychart if you like  As a diabetic, there are several things you can do to monitor your condition and maintain your health.  1. Check your feet daily for any skin breakdown 2. Exercise and keep track of your diet 3. Let us know before you run out of your medications 4. Get your annual flu shot, and ask if you need a pneumonia shot 5. Ask if you are up to date on your labs; you should have an A1c every 6 months, a urine protein test annually, and a cholesterol test annually.  Your doctor may decide to do labs more often if indicated 6. Take off your shoes and socks at each visit.  Be sure your doctor examines your feet.   7. Ask about your blood pressure.  Your goal is 130/ 80 or less 8. Get an annual eye exam.  Please ask your ophthalmologist to send [place] your report 9. Keep up with your dental cleanings and exams.

## 2014-11-21 LAB — COMPREHENSIVE METABOLIC PANEL
ALT: 48 U/L (ref 0–53)
AST: 24 U/L (ref 0–37)
Albumin: 4.7 g/dL (ref 3.5–5.2)
Alkaline Phosphatase: 78 U/L (ref 39–117)
BUN: 15 mg/dL (ref 6–23)
CO2: 22 mEq/L (ref 19–32)
Calcium: 9.6 mg/dL (ref 8.4–10.5)
Chloride: 101 mEq/L (ref 96–112)
Creat: 0.7 mg/dL (ref 0.50–1.35)
Glucose, Bld: 107 mg/dL — ABNORMAL HIGH (ref 70–99)
Potassium: 4.7 mEq/L (ref 3.5–5.3)
Sodium: 136 mEq/L (ref 135–145)
Total Bilirubin: 0.6 mg/dL (ref 0.2–1.2)
Total Protein: 7.8 g/dL (ref 6.0–8.3)

## 2014-11-21 LAB — CBC
HCT: 50 % (ref 39.0–52.0)
Hemoglobin: 17.8 g/dL — ABNORMAL HIGH (ref 13.0–17.0)
MCH: 29.5 pg (ref 26.0–34.0)
MCHC: 35.6 g/dL (ref 30.0–36.0)
MCV: 82.8 fL (ref 78.0–100.0)
MPV: 10.4 fL (ref 8.6–12.4)
Platelets: 495 10*3/uL — ABNORMAL HIGH (ref 150–400)
RBC: 6.04 MIL/uL — ABNORMAL HIGH (ref 4.22–5.81)
RDW: 13.5 % (ref 11.5–15.5)
WBC: 8.2 10*3/uL (ref 4.0–10.5)

## 2014-11-21 LAB — LIPID PANEL
Cholesterol: 223 mg/dL — ABNORMAL HIGH (ref 0–200)
HDL: 41 mg/dL (ref >=40)
LDL Cholesterol: 163 mg/dL — ABNORMAL HIGH (ref 0–99)
Total CHOL/HDL Ratio: 5.4 Ratio
Triglycerides: 93 mg/dL (ref <150)
VLDL: 19 mg/dL (ref 0–40)

## 2014-11-21 LAB — HIV ANTIBODY (ROUTINE TESTING W REFLEX): HIV 1&2 Ab, 4th Generation: NONREACTIVE

## 2014-11-22 LAB — VARICELLA ZOSTER ANTIBODY, IGG: Varicella IgG: 589.9 Index — ABNORMAL HIGH (ref <135.00)

## 2014-12-10 ENCOUNTER — Telehealth: Payer: Self-pay

## 2014-12-10 NOTE — Telephone Encounter (Signed)
Called and discussed his labs.  I am so sorry- for some reason I did not see or review his labs.  Went over his labs and discussed his CHL- he would prefer to work on his diet and exercise and recheck in a few months. Also advised that he is a bit polycythemica.  Would encourage him to try donating blood.  Plan to follow-up in 3-4 months.  He also states he has noted some deltoid pains, usually right but sometimes left- he had not mentioned this to me so advised him that I am not quite sure what to tell him about this, but am glad to see him for this at his convenience

## 2014-12-10 NOTE — Telephone Encounter (Signed)
Pt calling about labs. Please review. Thanks  

## 2015-04-22 ENCOUNTER — Ambulatory Visit: Payer: Self-pay | Admitting: Surgery

## 2015-04-22 NOTE — H&P (Signed)
Jarad Barth 04/22/2015 11:27 AM Location: [facility] Patient #: [id number] DOB: [birth year] Single / Language: [name] / Race: White Male History of Present Illness [name] Ariyana Faw MD; 04/22/2015 11:57 AM) Patient words: reck    The patient is a 44 year old male who presents with an umbilical/ventral hernia. 44 year old male who is referred by Dr. Janett Billow Copland for an evaluation of an umbilical hernia. The patient states he's had this hernia for several years. He states his become more bothersome and has more pulling sensation/pain. Patient is had no signs or symptoms of incarceration or granulation. Patient does not do any heavy lifting with his job duties. The patient recently had an A1c of 6.0. Patient does not smoke. The patient has bilateral gynecomastia since childhood and wants this removed. Not sure he has had any endocrine work up. Benies any mass or pain. Does not like his appearance. Denies drug use or ETOH use. Father had same problem.  The patient is a 44 year old male   Allergies Marjean Donna, [name]; 04/22/2015 11:27 AM) Penicillamine *ASSORTED CLASSES*  Medication History (Sonya Bynum, [name]; 04/22/2015 11:27 AM) Anastasio Auerbach (100MG  Tablet, Oral) Active. Lisinopril (10MG  Tablet, Oral) Active. Olux (0.05% Foam, External) Active. Claritin (10MG  Capsule, Oral) Active. Janumet (50-1000MG  Tablet, Oral) Active. Vitamin C ER (500MG  Tablet ER, Oral) Active. Medications Reconciled    Vitals (Sonya Bynum [name]; 04/22/2015 11:27 AM) 04/22/2015 11:27 AM Weight: 187 lb Height: 64in Body Surface Area: 1.96 m Body Mass Index: 32.1 kg/m Temp.: 67F(Temporal)  Pulse: 76 (Regular)  BP: 130/80 (Sitting, Left Arm, Standard)     Physical Exam (Avanish Cerullo A. Jaceon Heiberger MD; 04/22/2015 11:58 AM)  General Mental Status-Alert. General Appearance-Consistent with stated age. Hydration-Well hydrated. Voice-Normal.  Head and Neck Head-normocephalic,  atraumatic with no lesions or palpable masses. Trachea-midline. Thyroid Gland Characteristics - normal size and consistency.  Eye Eyeball - Bilateral-Extraocular movements intact. Sclera/Conjunctiva - Bilateral-No scleral icterus.  Chest and Lung Exam Chest and lung exam reveals -quiet, even and easy respiratory effort with no use of accessory muscles and on auscultation, normal breath sounds, no adventitious sounds and normal vocal resonance. Inspection Chest Wall - Normal. Back - normal.  Breast Note: bilateral gynecomastis no mass or other abnormality   Cardiovascular Cardiovascular examination reveals -normal heart sounds, regular rate and rhythm with no murmurs and normal pedal pulses bilaterally.  Abdomen Note: reducible modrate size umbilical/ ventral hernia diastasis recti noted non tender   Neurologic Neurologic evaluation reveals -alert and oriented x 3 with no impairment of recent or remote memory. Mental Status-Normal.  Musculoskeletal Normal Exam - Left-Upper Extremity Strength Normal and Lower Extremity Strength Normal. Normal Exam - Right-Upper Extremity Strength Normal and Lower Extremity Strength Normal.    Assessment & Plan (Amaurie Wandel A. Cendy Oconnor MD; 04/22/2015 11:58 AM)  VENTRAL HERNIA WITHOUT OBSTRUCTION OR GANGRENE (K43.9) Impression: 44 year old male with an umbilical/ venrtal hernia.  1. Patient like to proceed to the operating for an umbilical hernia repair with mesh. 2. All risks and benefits were discussed with the patient to generally include, but not limited to: infection, bleeding, damage to surrounding structures, acute and chronic nerve pain, and recurrence. Alternatives were offered and described. All questions were answered and the patient voiced understanding of the procedure and wishes to proceed at this point with hernia repair.  Current Plans Pt Education - [facility] Umbilical/ Inguinal Hernia HCI   The anatomy & physiology  of the abdominal wall was discussed. The pathophysiology of hernias was discussed. Natural history risks without  surgery including progeressive enlargement, pain, incarceration, & strangulation was discussed. Contributors to complications such as smoking, obesity, diabetes, prior surgery, etc were discussed.  I feel the risks of no intervention will lead to serious problems that outweigh the operative risks; therefore, I recommended surgery to reduce and repair the hernia. I explained laparoscopic techniques with possible need for an open approach. I noted the probable use of mesh to patch and/or buttress the hernia repair  Risks such as bleeding, infection, abscess, need for further treatment, heart attack, death, and other risks were discussed. I noted a good likelihood this will help address the problem. Goals of post-operative recovery were discussed as well. Possibility that this will not correct all symptoms was explained. I stressed the importance of low-impact activity, aggressive pain control, avoiding constipation, & not pushing through pain to minimize risk of post-operative chronic pain or injury. Possibility of reherniation especially with smoking, obesity, diabetes, immunosuppression, and other health conditions was discussed. We will work to minimize complications.  An educational handout further explaining the pathology & treatment options was given as well. Questions were answered. The patient expresses understanding & wishes to proceed with surgery. FAMILY HISTORY OF GYNECOMASTIA (Z84.89)  GYNECOMASTIA (N62) Impression: pt desires sugery to correct this PROBLEM Pt will need endocrine work up prior to surgery. Will check prolactin estrogen and testosterone levels as well as TSH and T4. Pt denies drug or ETOH use. He does have DM and I EXPLAINED THE IMPORTANCE OF GOOD CONTROL WITH HIM. Pt would need bilateral simple mastectomies. Ri Discussed treatment options for breast gynecomastia to  include breast conservation vs mastectomy with reconstruction. Pt has decided on mastectomy. Risk include bleeding, infection, flap necrosis, pain, numbness, recurrence, nipple loss hematoma, other surgery needs. Pt understands and agrees to proceed.  Current Plans   The anatomy and the physiology was discussed. The pathophysiology and natural history of the disease was discussed. Options were discussed and recommendations were made. Technique, risks, benefits, & alternatives were discussed. Risks such as stroke, heart attack, bleeding, indection, death, and other risks discussed. Questions answered. The patient agrees to proceed. Pt Education - [facility] Mastectomy HCI Pt Education - Patient education: When men develop breasts (gynecomastia) (The Basics): discussed with patient and provided information. DIASTASIS RECTI (M62.08)

## 2015-07-08 ENCOUNTER — Other Ambulatory Visit: Payer: Self-pay | Admitting: Family Medicine

## 2015-07-17 ENCOUNTER — Telehealth: Payer: Self-pay | Admitting: Family Medicine

## 2015-07-17 ENCOUNTER — Ambulatory Visit (INDEPENDENT_AMBULATORY_CARE_PROVIDER_SITE_OTHER): Payer: BLUE CROSS/BLUE SHIELD | Admitting: Family Medicine

## 2015-07-17 VITALS — BP 120/80 | HR 112 | Temp 98.6°F | Resp 17 | Ht 63.0 in | Wt 183.0 lb

## 2015-07-17 DIAGNOSIS — I10 Essential (primary) hypertension: Secondary | ICD-10-CM | POA: Diagnosis not present

## 2015-07-17 DIAGNOSIS — E785 Hyperlipidemia, unspecified: Secondary | ICD-10-CM | POA: Diagnosis not present

## 2015-07-17 DIAGNOSIS — E119 Type 2 diabetes mellitus without complications: Secondary | ICD-10-CM

## 2015-07-17 DIAGNOSIS — D751 Secondary polycythemia: Secondary | ICD-10-CM

## 2015-07-17 DIAGNOSIS — E1165 Type 2 diabetes mellitus with hyperglycemia: Secondary | ICD-10-CM | POA: Diagnosis not present

## 2015-07-17 DIAGNOSIS — L409 Psoriasis, unspecified: Secondary | ICD-10-CM

## 2015-07-17 DIAGNOSIS — Z23 Encounter for immunization: Secondary | ICD-10-CM

## 2015-07-17 DIAGNOSIS — IMO0001 Reserved for inherently not codable concepts without codable children: Secondary | ICD-10-CM

## 2015-07-17 LAB — COMPLETE METABOLIC PANEL WITH GFR
ALT: 59 U/L — ABNORMAL HIGH (ref 9–46)
AST: 29 U/L (ref 10–40)
Albumin: 5.3 g/dL — ABNORMAL HIGH (ref 3.6–5.1)
Alkaline Phosphatase: 87 U/L (ref 40–115)
BUN: 16 mg/dL (ref 7–25)
CO2: 21 mmol/L (ref 20–31)
Calcium: 10.5 mg/dL — ABNORMAL HIGH (ref 8.6–10.3)
Chloride: 101 mmol/L (ref 98–110)
Creat: 0.84 mg/dL (ref 0.60–1.35)
GFR, Est African American: >89 mL/min (ref >=60)
GFR, Est Non African American: >89 mL/min (ref >=60)
Glucose, Bld: 104 mg/dL — ABNORMAL HIGH (ref 65–99)
Potassium: 4.9 mmol/L (ref 3.5–5.3)
Sodium: 138 mmol/L (ref 135–146)
Total Bilirubin: 0.7 mg/dL (ref 0.2–1.2)
Total Protein: 8.7 g/dL — ABNORMAL HIGH (ref 6.1–8.1)

## 2015-07-17 LAB — CBC
HCT: 53.9 % — ABNORMAL HIGH (ref 39.0–52.0)
Hemoglobin: 18.8 g/dL — ABNORMAL HIGH (ref 13.0–17.0)
MCH: 29.6 pg (ref 26.0–34.0)
MCHC: 34.9 g/dL (ref 30.0–36.0)
MCV: 84.9 fL (ref 78.0–100.0)
MPV: 10.1 fL (ref 8.6–12.4)
Platelets: 394 10*3/uL (ref 150–400)
RBC: 6.35 MIL/uL — ABNORMAL HIGH (ref 4.22–5.81)
RDW: 14 % (ref 11.5–15.5)
WBC: 9.9 10*3/uL (ref 4.0–10.5)

## 2015-07-17 LAB — LIPID PANEL
Cholesterol: 255 mg/dL — ABNORMAL HIGH (ref 125–200)
HDL: 44 mg/dL (ref >=40)
LDL Cholesterol: 194 mg/dL — ABNORMAL HIGH (ref <130)
Total CHOL/HDL Ratio: 5.8 Ratio — ABNORMAL HIGH (ref <=5.0)
Triglycerides: 83 mg/dL (ref <150)
VLDL: 17 mg/dL (ref <30)

## 2015-07-17 LAB — POCT GLYCOSYLATED HEMOGLOBIN (HGB A1C): Hemoglobin A1C: 7.4

## 2015-07-17 LAB — HEMOGLOBIN A1C: Hgb A1c MFr Bld: 7.4 % — AB (ref 4.0–6.0)

## 2015-07-17 LAB — GLUCOSE, POCT (MANUAL RESULT ENTRY): POC Glucose: 108 mg/dl — AB (ref 70–99)

## 2015-07-17 MED ORDER — CANAGLIFLOZIN 100 MG PO TABS: 100.0000 mg | ORAL_TABLET | Freq: Every day | ORAL | Status: DC

## 2015-07-17 MED ORDER — SITAGLIPTIN PHOS-METFORMIN HCL 50-1000 MG PO TABS: 1.0000 | ORAL_TABLET | Freq: Two times a day (BID) | ORAL | Status: DC

## 2015-07-17 MED ORDER — CLOBETASOL PROPIONATE 0.05 % EX FOAM: Freq: Two times a day (BID) | CUTANEOUS | Status: DC

## 2015-07-17 MED ORDER — LISINOPRIL 10 MG PO TABS: ORAL_TABLET | ORAL | Status: DC

## 2015-07-17 NOTE — Patient Instructions (Addendum)
I will refer you to dermatologist for the psoriasis. Ok to continue steroid cream to the areas for now.  You should receive a call or letter about your lab results within the next week to 10 days, in addition to a phone call tonight on some of your results from today.   Follow up with primary provider at appointment center in 6 months.   If cholesterol is still elevated, recommend starting statin. Can let you know once  We receive results.   Diabetes and Standards of Medical Care Diabetes is complicated. You may find that your diabetes team includes a dietitian, nurse, diabetes educator, eye doctor, and more. To help everyone know what is going on and to help you get the care you deserve, the following schedule of care was developed to help keep you on track. Below are the tests, exams, vaccines, medicines, education, and plans you will need. HbA1c test This test shows how well you have controlled your glucose over the past 2-3 months. It is used to see if your diabetes management plan needs to be adjusted.   It is performed at least 2 times a year if you are meeting treatment goals.  It is performed 4 times a year if therapy has changed or if you are not meeting treatment goals. Blood pressure test  This test is performed at every routine medical visit. The goal is less than 140/90 mm Hg for most people, but 130/80 mm Hg in some cases. Ask your health care provider about your goal. Dental exam  Follow up with the dentist regularly. Eye exam  If you are diagnosed with type 1 diabetes as a child, get an exam upon reaching the age of 46 years or older and having had diabetes for 3-5 years. Yearly eye exams are recommended after that initial eye exam.  If you are diagnosed with type 1 diabetes as an adult, get an exam within 5 years of diagnosis and then yearly.  If you are diagnosed with type 2 diabetes, get an exam as soon as possible after the diagnosis and then yearly. Foot care  exam  Visual foot exams are performed at every routine medical visit. The exams check for cuts, injuries, or other problems with the feet.  You should have a complete foot exam performed every year. This exam includes an inspection of the structure and skin of your feet, a check of the pulses in your feet, and a check of the sensation in your feet.  Type 1 diabetes: The first exam is performed 5 years after diagnosis.  Type 2 diabetes: The first exam is performed at the time of diagnosis.  Check your feet nightly for cuts, injuries, or other problems with your feet. Tell your health care provider if anything is not healing. Kidney function test (urine microalbumin)  This test is performed once a year.  Type 1 diabetes: The first test is performed 5 years after diagnosis.  Type 2 diabetes: The first test is performed at the time of diagnosis.  A serum creatinine and estimated glomerular filtration rate (eGFR) test is done once a year to assess the level of chronic kidney disease (CKD), if present. Lipid profile (cholesterol, HDL, LDL, triglycerides)  Performed every 5 years for most people.  The goal for LDL is less than 100 mg/dL. If you are at high risk, the goal is less than 70 mg/dL.  The goal for HDL is 40 mg/dL-50 mg/dL for men and 50 mg/dL-60 mg/dL for women. An  HDL cholesterol of 60 mg/dL or higher gives some protection against heart disease.  The goal for triglycerides is less than 150 mg/dL. Immunizations  The flu (influenza) vaccine is recommended yearly for every person 79 months of age or older who has diabetes.  The pneumonia (pneumococcal) vaccine is recommended for every person 32 years of age or older who has diabetes. Adults 41 years of age or older may receive the pneumonia vaccine as a series of two separate shots.  The hepatitis B vaccine is recommended for adults shortly after they have been diagnosed with diabetes.  The Tdap (tetanus, diphtheria, and  pertussis) vaccine should be given:  According to normal childhood vaccination schedules, for children.  Every 10 years, for adults who have diabetes. Diabetes self-management education  Education is recommended at diagnosis and ongoing as needed. Treatment plan  Your treatment plan is reviewed at every medical visit.   This information is not intended to replace advice given to you by your health care provider. Make sure you discuss any questions you have with your health care provider.   Document Released: 05/24/2009 Document Revised: 08/17/2014 Document Reviewed: 12/27/2012 Elsevier Interactive Patient Education [employer].

## 2015-07-17 NOTE — Progress Notes (Addendum)
Subjective:  By signing my name below, I, [name], attest that this documentation has been prepared under the direction and in the presence of Logan Ray, MD.  Electronically Signed: [name], ED Scribe. 07/17/2015. 3:27 PM.   Patient ID: Logan [name], male    DOB: 1971-02-22, 44 y.o.   MRN: [id number]  HPI Chief Complaint  Patient presents with  . Medication Refill    invokana,lisinopril,janumet,  . Immunizations    flu shot '   HPI Comments: Logan [name] is a 44 y.o. male who presents to the [facility] for a follow up.    Pt is fasting today.  Diabetes type II  Has seen various provider here. Most recently Dr. Lorelei Pont in April for a CPE.  Lab Results  Component Value Date   HGBA1C 6.7 11/20/2014   He was continued on janumet, invokana. Pt is needing refills of all medication.  He has been taking invokana in the morning and janumet twice a day. He reports his highest readings range between 120-140 but otherwise sugars have been in normal range. He is tolerating medication well, without adverse effects. .   Pt has been seen by both dentist and optho this year. No retinal changes.   Hypertension Pt is on lisinopril 10 mg QD. He is not a smoker.   Lab Results  Component Value Date   CREATININE 0.70 11/20/2014   Lab Results  Component Value Date   CHOL 223* 11/20/2014   HDL 41 11/20/2014   LDLCALC 163* 11/20/2014   TRIG 93 11/20/2014   CHOLHDL 5.4 11/20/2014   Plan on recheck cholesterol after changing diet and exercise Pt exercises 2-3 times a week. He denies CP, SOB and trouble breathing with exercise.  He denies light headedness, HA, and  dizziness.   Polycythemia   Lab Results  Component Value Date   WBC 8.2 11/20/2014   HGB 17.8* 11/20/2014   HCT 50.0 11/20/2014   MCV 82.8 11/20/2014   PLT 495* 11/20/2014   Planned on recheck in approximately 3-4 months.   Psoriasis  Pt had biopsy of skin of middle back in 02/2014  which showed psoriasis. Pt reports rash mainly to back, neck, bilateral lower legs and some small area to scalp and forehead. He has been using Clobetasol everyday since 11/2014 and states he found improvement for a short period time but no longer finds this treatment effective. He also uses diprolene 1-2 times a day, mainly in the evening, which he states lightens area.  He is not using triamcinolone. Pt reports trouble applying cream to middle back and would like a medication that he could take by mouth. He was seen by Dermatology but is unable to recall the name of the facility. Pt states he had a bad experiences at that facility and would like to be referred else where.   Pt agrees to flu shot today.  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   Patient Active Problem List   Diagnosis Date Noted  . Diabetes ([place]) 05/31/2012  . Hypertension 05/31/2012   Past Medical History  Diagnosis Date  . Diabetes mellitus   . Hypertension   . Allergy   . Hernia, umbilical    No past surgical history on file. Allergies  Allergen Reactions  . Penicillins    Prior to Admission medications   Medication Sig Start Date End Date Taking? Authorizing Provider  augmented betamethasone dipropionate (DIPROLENE) 0.05 % ointment Apply topically 2 (two)  times daily. Use as needed for psoriasis 11/20/14  Yes Logan Filler Copland, MD  canagliflozin (INVOKANA) 100 MG TABS tablet Take 1 tablet (100 mg total) by mouth daily. 11/20/14  Yes Logan Filler Copland, MD  clobetasol (OLUX) 0.05 % topical foam Apply topically 2 (two) times daily. Use as needed for psoriasis on scalp 11/20/14  Yes Logan C Copland, MD  lisinopril (PRINIVIL,ZESTRIL) 10 MG tablet TAKE 1 TABLET (10 MG TOTAL) BY MOUTH DAILY  "OFFICE VISIT NEEDED FOR REFILLS" 07/08/15  Yes Logan Filler Copland, MD  loratadine (CLARITIN) 10 MG tablet Take 10 mg by mouth daily.   Yes Historical Provider, MD  sitaGLIPtin-metformin (JANUMET) 50-1000 MG per tablet Take 1 tablet  by mouth 2 (two) times daily with a meal. 11/20/14  Yes Logan Filler Copland, MD  triamcinolone cream (KENALOG) 0.1 % Apply 1 application topically 2 (two) times daily. 06/21/13  Yes Roselee Culver, MD  vitamin C (ASCORBIC ACID) 500 MG tablet Take 500 mg by mouth daily.   Yes Historical Provider, MD   Social History   Social History  . Marital Status: Single    Spouse Name: N/A  . Number of Children: N/A  . Years of Education: N/A   Occupational History  . Not on file.   Social History Main Topics  . Smoking status: Never Smoker   . Smokeless tobacco: Not on file  . Alcohol Use: No  . Drug Use: No  . Sexual Activity: Not on file   Other Topics Concern  . Not on file   Social History Narrative   Review of Systems  Constitutional: Negative for fatigue and unexpected weight change.  Eyes: Negative for visual disturbance.  Respiratory: Negative for cough, chest tightness and shortness of breath.   Cardiovascular: Negative for chest pain, palpitations and leg swelling.  Gastrointestinal: Negative for abdominal pain and blood in stool.  Skin: Positive for color change and rash.  Neurological: Negative for dizziness, light-headedness and headaches.    Objective:   Physical Exam  Constitutional: He is oriented to person, place, and time. He appears well-developed and well-nourished. No distress.  HENT:  Head: Normocephalic and atraumatic.  Eyes: Conjunctivae and EOM are normal.  Neck: Neck supple.  Cardiovascular: Normal rate.   Pulmonary/Chest: Effort normal.  Musculoskeletal: Normal range of motion.  Neurological: He is alert and oriented to person, place, and time.  Skin: Skin is warm and dry.  multiple scatter, hyperpigmented, erythematous  Plaques across mid back upper back. 1 small plaque on medial right eye brow. Few small plaques on lower extremities on bilateral leg.  Small faint plaques on crown of head.   Psychiatric: He has a normal mood and affect. His behavior  is normal.  Nursing note and vitals reviewed.  Filed Vitals:   07/17/15 1431  BP: 120/80  Pulse: 112  Temp: 98.6 F (37 C)  TempSrc: Oral  Resp: 17  Height: $Remove'5\' 3"'RVczyeP$  (1.6 m)  Weight: 183 lb (83.008 kg)  SpO2: 98%   Results for orders placed or performed in visit on 07/17/15  POCT glucose (manual entry)  Result Value Ref Range   POC Glucose 108 (A) 70 - 99 mg/dl  POCT glycosylated hemoglobin (Hb A1C)  Result Value Ref Range   Hemoglobin A1C 7.4      Assessment & Plan:   Tallie Hevia is a 44 y.o. male Need for prophylactic vaccination and inoculation against influenza - Plan: Flu Vaccine QUAD 36+ mos  given  Uncontrolled type 2 diabetes  mellitus without complication, without long-term current use of insulin- Plan: POCT glucose (manual entry), POCT glycosylated hemoglobin (Hb A1C), Microalbumin, urine  - decreased control, but borderline. Results available after patient left. Attempted call to phone.   -continue same dose meds for now, but watch diet, monitor home readings and recheck in 3 months.   -urine microalbumin pending.   Polycythemia - Plan: CBC repeated.  Hyperlipidemia - Plan: Lipid panel, COMPLETE METABOLIC PANEL WITH GFR  -with underlying DM2,suspect will need statin, but lipids pending to decide on dose/agent.   Psoriasis - Plan: Ambulatory referral to Dermatology, clobetasol (OLUX) 0.05 % topical foam  -persistent and difficulty with applying steroid cream based on location on back  -refer to derm to look into other options including possible biologic.   Essential hypertension - Plan: lisinopril (PRINIVIL,ZESTRIL) 10 MG tablet  -stable. meds refilled. Plan on cpe in 6 months, recheck DM in 3 months.   Meds ordered this encounter  Medications  . clobetasol (OLUX) 0.05 % topical foam    Sig: Apply topically 2 (two) times daily. Use as needed for psoriasis on scalp    Dispense:  100 g    Refill:  0  . canagliflozin (INVOKANA) 100 MG TABS tablet    Sig:  Take 1 tablet (100 mg total) by mouth daily.    Dispense:  90 tablet    Refill:  1  . lisinopril (PRINIVIL,ZESTRIL) 10 MG tablet    Sig: TAKE 1 TABLET (10 MG TOTAL) BY MOUTH DAILY    Dispense:  90 tablet    Refill:  1  . sitaGLIPtin-metformin (JANUMET) 50-1000 MG tablet    Sig: Take 1 tablet by mouth 2 (two) times daily with a meal.    Dispense:  180 tablet    Refill:  1   Patient Instructions  I will refer you to dermatologist for the psoriasis. Ok to continue steroid cream to the areas for now.  You should receive a call or letter about your lab results within the next week to 10 days, in addition to a phone call tonight on some of your results from today.   Follow up with primary provider at appointment center in 6 months.   If cholesterol is still elevated, recommend starting statin. Can let you know once  We receive results.   Diabetes and Standards of Medical Care Diabetes is complicated. You may find that your diabetes team includes a dietitian, nurse, diabetes educator, eye doctor, and more. To help everyone know what is going on and to help you get the care you deserve, the following schedule of care was developed to help keep you on track. Below are the tests, exams, vaccines, medicines, education, and plans you will need. HbA1c test This test shows how well you have controlled your glucose over the past 2-3 months. It is used to see if your diabetes management plan needs to be adjusted.   It is performed at least 2 times a year if you are meeting treatment goals.  It is performed 4 times a year if therapy has changed or if you are not meeting treatment goals. Blood pressure test  This test is performed at every routine medical visit. The goal is less than 140/90 mm Hg for most people, but 130/80 mm Hg in some cases. Ask your health care provider about your goal. Dental exam  Follow up with the dentist regularly. Eye exam  If you are diagnosed with type 1 diabetes as a  child, get  an exam upon reaching the age of 79 years or older and having had diabetes for 3-5 years. Yearly eye exams are recommended after that initial eye exam.  If you are diagnosed with type 1 diabetes as an adult, get an exam within 5 years of diagnosis and then yearly.  If you are diagnosed with type 2 diabetes, get an exam as soon as possible after the diagnosis and then yearly. Foot care exam  Visual foot exams are performed at every routine medical visit. The exams check for cuts, injuries, or other problems with the feet.  You should have a complete foot exam performed every year. This exam includes an inspection of the structure and skin of your feet, a check of the pulses in your feet, and a check of the sensation in your feet.  Type 1 diabetes: The first exam is performed 5 years after diagnosis.  Type 2 diabetes: The first exam is performed at the time of diagnosis.  Check your feet nightly for cuts, injuries, or other problems with your feet. Tell your health care provider if anything is not healing. Kidney function test (urine microalbumin)  This test is performed once a year.  Type 1 diabetes: The first test is performed 5 years after diagnosis.  Type 2 diabetes: The first test is performed at the time of diagnosis.  A serum creatinine and estimated glomerular filtration rate (eGFR) test is done once a year to assess the level of chronic kidney disease (CKD), if present. Lipid profile (cholesterol, HDL, LDL, triglycerides)  Performed every 5 years for most people.  The goal for LDL is less than 100 mg/dL. If you are at high risk, the goal is less than 70 mg/dL.  The goal for HDL is 40 mg/dL-50 mg/dL for men and 50 mg/dL-60 mg/dL for women. An HDL cholesterol of 60 mg/dL or higher gives some protection against heart disease.  The goal for triglycerides is less than 150 mg/dL. Immunizations  The flu (influenza) vaccine is recommended yearly for every person 6 months  of age or older who has diabetes.  The pneumonia (pneumococcal) vaccine is recommended for every person 12 years of age or older who has diabetes. Adults 65 years of age or older may receive the pneumonia vaccine as a series of two separate shots.  The hepatitis B vaccine is recommended for adults shortly after they have been diagnosed with diabetes.  The Tdap (tetanus, diphtheria, and pertussis) vaccine should be given:  According to normal childhood vaccination schedules, for children.  Every 10 years, for adults who have diabetes. Diabetes self-management education  Education is recommended at diagnosis and ongoing as needed. Treatment plan  Your treatment plan is reviewed at every medical visit.   This information is not intended to replace advice given to you by your health care provider. Make sure you discuss any questions you have with your health care provider.   Document Released: 05/24/2009 Document Revised: 08/17/2014 Document Reviewed: 12/27/2012 Elsevier Interactive Patient Education [employer].      I personally performed the services described in this documentation, which was scribed in my presence. The recorded information has been reviewed and considered, and addended by me as needed.

## 2015-07-17 NOTE — Telephone Encounter (Signed)
Please call pt.   I tried to reach him at 9:15pm Wednesday night, but got a voicemail.  His A1c was not as well controlled:  Lab Results  Component Value Date   HGBA1C 7.4 07/17/2015   We can continue same dose of meds for now, but exercise and watch diet, monitor home readings for increases and recheck in 3 months for repeat A1c instead of 6 months.  Other labs still pending.  Let me know if he has questions.

## 2015-07-18 LAB — MICROALBUMIN, URINE: Microalb, Ur: 1.7 mg/dL

## 2015-07-18 NOTE — Telephone Encounter (Signed)
Patient notified and voiced understanding.

## 2015-07-25 ENCOUNTER — Encounter: Payer: Self-pay | Admitting: Family Medicine

## 2015-08-27 ENCOUNTER — Telehealth: Payer: Self-pay

## 2015-08-27 NOTE — Telephone Encounter (Signed)
Patient is calling because he got his lab results in the mail and doesn't understand them. Please call!

## 2015-08-29 NOTE — Telephone Encounter (Signed)
Pts questions answered.  

## 2015-10-06 ENCOUNTER — Other Ambulatory Visit: Payer: Self-pay | Admitting: Family Medicine

## 2015-11-04 ENCOUNTER — Ambulatory Visit: Payer: Self-pay | Admitting: Surgery

## 2015-11-04 ENCOUNTER — Other Ambulatory Visit: Payer: Self-pay | Admitting: Family Medicine

## 2015-11-04 NOTE — H&P (Signed)
Stana Bunting  Location: [facility] Patient #: [id number] DOB: 10/26/1970 Single / Language: [name] / Race: White Male   History of Present Illness [name] Cleatus Goodin MD; Patient words: reck    The patient is a 45 year old male who presents with an umbilical/ventral hernia. 45 year old male who is referred by Dr. Janett Billow Copland for an evaluation of an umbilical hernia. The patient states he's had this hernia for several years. He states his become more bothersome and has more pulling sensation/pain. Patient is had no signs or symptoms of incarceration or granulation. Patient does not do any heavy lifting with his job duties. The patient recently had an A1c of 6.0. Patient does not smoke. The patient has bilateral gynecomastia since childhood and wants this removed. Not sure he has had any endocrine work up. Benies any mass or pain. Does not like his appearance. Denies drug use or ETOH use. Father had same problem.  The patient is a 45 year old male   Allergies  Penicillamine *ASSORTED CLASSES*  Medication History  Invokana (100MG  Tablet, Oral) Active. Lisinopril (10MG  Tablet, Oral) Active. Olux (0.05% Foam, External) Active. Claritin (10MG  Capsule, Oral) Active. Janumet (50-1000MG  Tablet, Oral) Active. Vitamin C ER (500MG  Tablet ER, Oral) Active. Medications Reconciled  Vitals  04/22/2015 11:27 AM Weight: 187 lb Height: 64in Body Surface Area: 1.96 m Body Mass Index: 32.1 kg/m  Temp.: 69F(Temporal)  Pulse: 76 (Regular)  BP: 130/80 (Sitting, Left Arm, Standard)     Physical Exam  General Mental Status-Alert. General Appearance-Consistent with stated age. Hydration-Well hydrated. Voice-Normal.  Head and Neck Head-normocephalic, atraumatic with no lesions or palpable masses. Trachea-midline. Thyroid Gland Characteristics - normal size and consistency.  Eye Eyeball - Bilateral-Extraocular movements  intact. Sclera/Conjunctiva - Bilateral-No scleral icterus.  Chest and Lung Exam Chest and lung exam reveals -quiet, even and easy respiratory effort with no use of accessory muscles and on auscultation, normal breath sounds, no adventitious sounds and normal vocal resonance. Inspection Chest Wall - Normal. Back - normal.  Breast Note: bilateral gynecomastis no mass or other abnormality   Cardiovascular Cardiovascular examination reveals -normal heart sounds, regular rate and rhythm with no murmurs and normal pedal pulses bilaterally.  Abdomen Note: reducible modrate size umbilical/ ventral hernia diastasis recti noted non tender   Neurologic Neurologic evaluation reveals -alert and oriented x 3 with no impairment of recent or remote memory. Mental Status-Normal.  Musculoskeletal Normal Exam - Left-Upper Extremity Strength Normal and Lower Extremity Strength Normal. Normal Exam - Right-Upper Extremity Strength Normal and Lower Extremity Strength Normal.    Assessment & Plan  VENTRAL HERNIA WITHOUT OBSTRUCTION OR GANGRENE (K43.9) Impression: 45 year old male with an umbilical/ venrtal hernia.  1. Patient like to proceed to the operating for an umbilical hernia repair with mesh. 2. All risks and benefits were discussed with the patient to generally include, but not limited to: infection, bleeding, damage to surrounding structures, acute and chronic nerve pain, and recurrence. Alternatives were offered and described. All questions were answered and the patient voiced understanding of the procedure and wishes to proceed at this point with hernia repair. Current Plans Pt Education - [facility] Umbilical/ Inguinal Hernia [facility]   The anatomy & physiology of the abdominal wall was discussed. The pathophysiology of hernias was discussed. Natural history risks without surgery including progeressive enlargement, pain, incarceration, & strangulation was discussed. Contributors to  complications such as smoking, obesity, diabetes, prior surgery, etc were discussed.  I feel the risks of no intervention will lead to  serious problems that outweigh the operative risks; therefore, I recommended surgery to reduce and repair the hernia. I explained laparoscopic techniques with possible need for an open approach. I noted the probable use of mesh to patch and/or buttress the hernia repair  Risks such as bleeding, infection, abscess, need for further treatment, heart attack, death, and other risks were discussed. I noted a good likelihood this will help address the problem. Goals of post-operative recovery were discussed as well. Possibility that this will not correct all symptoms was explained. I stressed the importance of low-impact activity, aggressive pain control, avoiding constipation, & not pushing through pain to minimize risk of post-operative chronic pain or injury. Possibility of reherniation especially with smoking, obesity, diabetes, immunosuppression, and other health conditions was discussed. We will work to minimize complications.  An educational handout further explaining the pathology & treatment options was given as well. Questions were answered. The patient expresses understanding & wishes to proceed with surgery.  FAMILY HISTORY OF GYNECOMASTIA (Z84.89) GYNECOMASTIA (N62) Impression: pt desires sugery to correct this PROBLEM P Pt denies drug or ETOH use. He does have DM and I EXPLAINED THE IMPORTANCE OF GOOD CONTROL WITH HIM. Pt would need bilateral nipple sparing  mastectomies. Discussed treatment options for breast gynecomastia to include breast conservation vs mastectomy with reconstruction. Pt has decided on mastectomy. Risk include bleeding, infection, flap necrosis, pain, nipple loss numbness, recurrence, nipple loss hematoma, other surgery needs. Pt understands and agrees to proceed. He understands this is cosmetic in nature.  Current Plans   The anatomy and  the physiology was discussed. The pathophysiology and natural history of the disease was discussed. Options were discussed and recommendations were made. Technique, risks, benefits, & alternatives were discussed. Risks such as stroke, heart attack, bleeding, indection, death, and other risks discussed. Questions answered. The patient agrees to proceed.  Pt Education - [facility] Mastectomy [facility] Pt Education - Patient education: When men develop breasts (gynecomastia) (The Basics): discussed with patient and provided information. DIASTASIS RECTI (M62.08)    Signed by Turner Daniels, MD

## 2015-12-06 ENCOUNTER — Telehealth: Payer: Self-pay

## 2015-12-06 NOTE — Telephone Encounter (Signed)
Returned pt. Phone call he wanted to know what his last a1c was as well as what his weight and height was and last blood pressure reading.

## 2016-01-24 DIAGNOSIS — Z0271 Encounter for disability determination: Secondary | ICD-10-CM

## 2016-01-30 ENCOUNTER — Ambulatory Visit: Payer: BLUE CROSS/BLUE SHIELD | Admitting: Family Medicine

## 2016-02-06 ENCOUNTER — Encounter: Payer: BLUE CROSS/BLUE SHIELD | Admitting: Family Medicine

## 2016-03-17 ENCOUNTER — Ambulatory Visit: Payer: Self-pay | Admitting: Surgery

## 2016-03-17 NOTE — H&P (Signed)
Logan [name]. Logan [name] 03/17/2016 9:38 AM Location: [facility] Patient #: [id number] DOB: 1951/02/18 Married / Language: English / Race: White Male  History of Present Illness Logan [name] [name] MD; 03/17/2016 11:59 AM) Patient words: GB      Patient sent at the request of Dr. Oletta Lamas for gallstones. Patient relates a multiple month history of abdominal bloating. He is also been seen in the emergency room over the last 6 months for sudden chest pain which was found to be noncardiac in nature. He denies any difficulty eating fatty or greasy foods. Has an overall sense of abdominal bloating after eating. His bowel function has been normal. Denies blood in the stool and he is not vomiting. Ultrasound was done which showed small gallstones. He also had signs of adenomyomatosis. He is here today to discuss the potential benefit of cholecystectomy. He is not losing weight. He does have a decrease in appetite overall. He does have reflux and takes medication for that. Nothing makes his symptoms better or worse and he has the symptoms daily.  The patient is a 45 year old male.   Other Problems [name], [name]; 03/17/2016 9:38 AM) Chest pain Cholelithiasis Diabetes Mellitus Gastroesophageal Reflux Disease High blood pressure Hypercholesterolemia Myocardial infarction Sleep Apnea  Past Surgical History [name], [place]; 03/17/2016 9:38 AM) Oral Surgery Tonsillectomy Vasectomy  Diagnostic Studies History [name], [place]; 03/17/2016 9:38 AM) Colonoscopy 1-5 years ago  Allergies [name], [name]; 03/17/2016 9:38 AM) No Known Drug Allergies 03/17/2016  Medication History [name], [place]; 03/17/2016 9:41 AM) [name] (5MG Tablet, Oral) Active. Clopidogrel Bisulfate (75MG Tablet, Oral) Active. Vytorin (10-10MG Tablet, Oral) Active. Pantoprazole Sodium (40MG Tablet DR, Oral) Active. Lisinopril-Hydrochlorothiazide (20-12.5MG  Tablet, Oral) Active. GlipiZIDE XL (5MG Tablet ER 24HR, Oral) Active. Centrum Silver (Oral) Active. Actoplus Met (15-850MG Tablet, Oral) Active. Ecotrin (325MG Tablet DR, Oral) Active. Aspirin (325MG Tablet, Oral) Active. Medications Reconciled  Social History [name], [place]; 03/17/2016 9:38 AM) Alcohol use Occasional alcohol use. Caffeine use Carbonated beverages, Coffee. No drug use Tobacco use Former smoker.  Family History [name], [place]; 03/17/2016 9:38 AM) Alcohol Abuse Father, Mother. Cerebrovascular Accident Father. Diabetes Mellitus Family Members In General. Heart Disease Father. Respiratory Condition Mother.     Review of Systems (Denair. Brooks [name]; 03/17/2016 9:38 AM) General Present- Fatigue. Not Present- Appetite Loss, Chills, Fever, Night Sweats, Weight Gain and Weight Loss. Skin Not Present- Change in Wart/Mole, Dryness, Hives, Jaundice, New Lesions, Non-Healing Wounds, Rash and Ulcer. HEENT Not Present- Earache, Hearing Loss, Hoarseness, Nose Bleed, Oral Ulcers, Ringing in the Ears, Seasonal Allergies, Sinus Pain, Sore Throat, Visual Disturbances, Wears glasses/contact lenses and Yellow Eyes. Respiratory Present- Snoring. Not Present- Bloody sputum, Chronic Cough, Difficulty Breathing and Wheezing. Breast Not Present- Breast Mass, Breast Pain, Nipple Discharge and Skin Changes. Cardiovascular Not Present- Chest Pain, Difficulty Breathing Lying Down, Leg Cramps, Palpitations, Rapid Heart Rate, Shortness of Breath and Swelling of Extremities. Gastrointestinal Present- Bloating and Indigestion. Not Present- Abdominal Pain, Bloody Stool, Change in Bowel Habits, Chronic diarrhea, Constipation, Difficulty Swallowing, Excessive gas, Gets full quickly at meals, Hemorrhoids, Nausea, Rectal Pain and Vomiting. Male Genitourinary Not Present- Blood in Urine, Change in Urinary Stream, Frequency, Impotence, Nocturia, Painful Urination, Urgency and  Urine Leakage. Musculoskeletal Not Present- Back Pain, Joint Pain, Joint Stiffness, Muscle Pain, Muscle Weakness and Swelling of Extremities. Neurological Not Present- Decreased Memory, Fainting, Headaches, Numbness, Seizures, Tingling, Tremor, Trouble walking and Weakness. Psychiatric Not Present- Anxiety, Bipolar,  Change in Sleep Pattern, Depression, Fearful and Frequent crying. Endocrine Not Present- Cold Intolerance, Excessive Hunger, Hair Changes, Heat Intolerance, Hot flashes and New Diabetes. Hematology Not Present- Blood Thinners, Easy Bruising, Excessive bleeding, Gland problems, HIV and Persistent Infections.  Vitals [name] [name]; 03/17/2016 9:38 AM) 03/17/2016 9:38 AM Weight: 266 lb Height: 71in Body Surface Area: 2.38 m Body Mass Index: 37.1 kg/m  BP: 132/80 (Sitting, Left Arm, Standard)      Physical Exam ([name] MD; 03/17/2016 11:59 AM)  General Mental Status-Alert. General Appearance-Consistent with stated age. Hydration-Well hydrated. Voice-Normal.  Head and Neck Head-normocephalic, atraumatic with no lesions or palpable masses.  Eye Eyeball - Bilateral-Extraocular movements intact. Sclera/Conjunctiva - Bilateral-No scleral icterus.  Chest and Lung Exam Chest and lung exam reveals -quiet, even and easy respiratory effort with no use of accessory muscles and on auscultation, normal breath sounds, no adventitious sounds and normal vocal resonance. Inspection Chest Wall - Normal. Back - normal.  Cardiovascular Cardiovascular examination reveals -on palpation PMI is normal in location and amplitude, no palpable S3 or S4. Normal cardiac borders., normal heart sounds, regular rate and rhythm with no murmurs, carotid auscultation reveals no bruits and normal pedal pulses bilaterally.  Abdomen Inspection Inspection of the abdomen reveals - No Hernias. Skin - Scar - no surgical scars. Palpation/Percussion Palpation and  Percussion of the abdomen reveal - Soft, Non Tender, No Rebound tenderness, No Rigidity (guarding) and No hepatosplenomegaly. Auscultation Auscultation of the abdomen reveals - Bowel sounds normal.  Neurologic Neurologic evaluation reveals -alert and oriented x 3 with no impairment of recent or remote memory. Mental Status-Normal.  Musculoskeletal Normal Exam - Left-Upper Extremity Strength Normal and Lower Extremity Strength Normal. Normal Exam - Right-Upper Extremity Strength Normal, Lower Extremity Weakness.    Assessment & Plan (Darrek Leasure A. Lolamae Voisin MD; 03/17/2016 12:00 PM)  SYMPTOMATIC CHOLELITHIASIS (K80.20) Impression: Discussed the patient's symptoms with him as well as his multiple other medical problems and risk factors for gallbladder disease. He is diabetic and her the age of 45. Males in this category may have atypical presentation of gallstone disease. I discussed the pros and cons and long-term expectations of cholecystectomy. I explained to him that surgery may not help his overall symptoms since his symptoms are somewhat atypical. He understands the above would like to proceed with cholecystectomy. I told him success rates this circumstance for him to be about 60% at relieving his symptoms. The procedure has been discussed with the patient. Risks of laparoscopic cholecystectomy include bleeding, infection, bile duct injury, leak, death, open surgery, diarrhea, other surgery, organ injury, blood vessel injury, DVT, and additional care.  Current Plans You are being scheduled for surgery - Our schedulers will call you.  You should hear from our office's scheduling department within 5 working days about the location, date, and time of surgery. We try to make accommodations for patient's preferences in scheduling surgery, but sometimes the OR schedule or the surgeon's schedule prevents [place] from making those accommodations.  If you have not heard from our office [phone]) in  5 working days, call the office and ask for your surgeon's nurse.  If you have other questions about your diagnosis, plan, or surgery, call the office and ask for your surgeon's nurse.  Pt Education - Pamphlet Given - Laparoscopic Gallbladder Surgery: discussed with patient and provided information. The anatomy & physiology of hepatobiliary & pancreatic function was discussed. The pathophysiology of gallbladder dysfunction was discussed. Natural history risks without surgery was discussed. I  feel the risks of no intervention will lead to serious problems that outweigh the operative risks; therefore, I recommended cholecystectomy to remove the pathology. I explained laparoscopic techniques with possible need for an open approach. Probable cholangiogram to evaluate the bilary tract was explained as well.  Risks such as bleeding, infection, abscess, leak, injury to other organs, need for further treatment, heart attack, death, and other risks were discussed. I noted a good likelihood this will help address the problem. Possibility that this will not correct all abdominal symptoms was explained. Goals of post-operative recovery were discussed as well. We will work to minimize complications. An educational handout further explaining the pathology and treatment options was given as well. Questions were answered. The patient expresses understanding & wishes to proceed with surgery.  Pt Education - CCS Laparosopic Post Op HCI (Gross) Pt Education - Laparoscopic Cholecystectomy: gallbladder

## 2016-03-17 NOTE — H&P (Signed)
Logan [name] 11/04/2015 1:46 PM Location: [facility] Patient #: [id number] DOB: 14-Oct-1970 Single / Language: Logan [name] / Race: White Male   History of Present Illness [name] Royce Stegman MD; 11/04/2015 3:13 PM) Patient words: hernia/gynecomastia    Patient returns for follow-up of bilateral gynecomastia and umbilical hernia. He was scheduled for umbilical hernia repair last year but canceled. He has strong desire for mastectomy for bilateral hypermastia.  now wishes to schedule his umbilical hernia  And bilateral mastectomy Mesh in wishes to undergo bilateral nipple sparing mastectomy for his gynecomastia. There is no causes gynecomastia except his diabetes and this is under good control the last year.  The patient is a 45 year old male.   Problem List/Past Medical Logan [name], [place]; 11/04/2015 1:47 PM) GYNECOMASTIA (N62) DIASTASIS RECTI (M62.08) FAMILY HISTORY OF GYNECOMASTIA (Z84.89)  Other Problems Logan [name], [place]; 11/04/2015 1:47 PM) VENTRAL HERNIA WITHOUT OBSTRUCTION OR GANGRENE (K43.9)12/17/2014 Diabetes Mellitus High blood pressure Hypercholesterolemia Umbilical Hernia Repair  Past Surgical History Logan [name], [place]; 11/04/2015 1:47 PM) No pertinent past surgical history  Diagnostic Studies History Logan [name], [name]; 11/04/2015 1:47 PM) Colonoscopy never  Allergies Logan [name], [name]; 11/04/2015 1:47 PM) Penicillamine *ASSORTED CLASSES*  Medication History Logan [name], [name]; 11/04/2015 1:48 PM) Vitamin C ER (500MG  Tablet ER, Oral) Active. Janumet (50-1000MG  Tablet, Oral) Active. Claritin (10MG  Capsule, Oral) Active. Lisinopril (10MG  Tablet, Oral) Active. Invokana (100MG  Tablet, Oral) Active. Vitamin B Complex (Oral) Active. Vitamin B 12 (100MCG Lozenge, Oral) Active. Vitamin C (500MG  Capsule, Oral) Active. Aspirin (81MG  Tablet, Oral) Active. Medications Reconciled  Social History Logan [name], [place]; 11/04/2015 1:48 PM) Alcohol use Occasional alcohol use. Caffeine use Carbonated beverages, Coffee, Tea. No drug use Tobacco use Never smoker.  Family History Logan [name], [place]; 11/04/2015 1:48 PM) Alcohol Abuse Father. Arthritis Mother, Sister. Diabetes Mellitus Brother, Father. Hypertension Mother. Ischemic Bowel Disease Mother. Migraine Headache Sister.  Vitals [name] [name]; 11/04/2015 1:47 PM) 11/04/2015 1:46 PM Weight: 185.25 lb Height: 64in Body Surface Area: 1.89 m Body Mass Index: 31.8 kg/m  BP: 114/78 (Sitting, Left Arm, Standard)       Physical Exam ([name] A. [name]; 11/04/2015 3:14 PM) General Mental Status-Alert. General Appearance-Consistent with stated age. Hydration-Well hydrated. Voice-Normal.  Breast Note: Bilateral breasts with significant hyperplasia noted. No masses.   Abdomen Note: Reducible umbilical hernia. Small. Not tender. Rectus diastases noted.   Neurologic Neurologic evaluation reveals -alert and oriented x 3 with no impairment of recent or remote memory. Mental Status-Normal.  Musculoskeletal Normal Exam - Left-Upper Extremity Strength Normal and Lower Extremity Strength Normal. Normal Exam - Right-Upper Extremity Strength Normal and Lower Extremity Strength Normal.    Assessment & Plan (Logan [name] A. [name]; 11/04/2015 3:14 PM) GYNECOMASTIA (N62) Impression: pt desires sugery to correct this PROBLEM Pt will need endocrine work up prior to surgery. He does have DM and I EXPLAINED THE IMPORTANCE OF GOOD CONTROL WITH HIM. Pt would need bilateral simple mastectomies. Ri Discussed treatment options for breast gynecomastia to include breast conservation vs mastectomy with reconstruction. Pt has decided on mastectomy. Risk include bleeding, infection, flap necrosis, pain, numbness, recurrence, nipple loss hematoma, other surgery needs. Pt understands and agrees to proceed.  This cosmetic will use nipple preserving approach recovery discussed and long term expectations DIASTASIS RECTI (M62.08) Impression: no treatment needed Current Plans Pt Education - CCS Mastectomy HCI Pt Education - Pamphlet Given - Hernia Surgery: discussed with patient and provided information. The anatomy &  physiology of the abdominal wall was discussed. The pathophysiology of hernias was discussed. Natural history risks without surgery including progeressive enlargement, pain, incarceration, & strangulation was discussed. Contributors to complications such as smoking, obesity, diabetes, prior surgery, etc were discussed.  I feel the risks of no intervention will lead to serious problems that outweigh the operative risks; therefore, I recommended surgery to reduce and repair the hernia. I explained laparoscopic techniques with possible need for an open approach. I noted the probable use of mesh to patch and/or buttress the hernia repair  Risks such as bleeding, infection, abscess, need for further treatment, heart attack, death, and other risks were discussed. I noted a good likelihood this will help address the problem. Goals of post-operative recovery were discussed as well. Possibility that this will not correct all symptoms was explained. I stressed the importance of low-impact activity, aggressive pain control, avoiding constipation, & not pushing through pain to minimize risk of post-operative chronic pain or injury. Possibility of reherniation especially with smoking, obesity, diabetes, immunosuppression, and other health conditions was discussed. We will work to minimize complications.  An educational handout further explaining the pathology & treatment options was given as well. Questions were answered. The patient expresses understanding & wishes to proceed with surgery.  Pt Education - Consent for inguinal hernia - [place]: discussed with patient and provided information. Pt  Education - CCS Hernia Post-Op HCI ([place]ross): discussed with patient and provided information. UMBILICAL HERNIA ([id number]) Impression: The risk of hernia repair include bleeding, infection, organ injury, bowel injury, bladder injury, nerve injury recurrent hernia, blood clots, worsening of underlying condition, chronic pain, mesh use, open surgery, death, and the need for other operattions. Pt agrees to proceed    Signed by Turner Daniels, MD

## 2016-03-23 ENCOUNTER — Ambulatory Visit (INDEPENDENT_AMBULATORY_CARE_PROVIDER_SITE_OTHER): Payer: BLUE CROSS/BLUE SHIELD | Admitting: Family Medicine

## 2016-03-23 ENCOUNTER — Encounter (HOSPITAL_COMMUNITY)
Admission: RE | Admit: 2016-03-23 | Discharge: 2016-03-23 | Disposition: A | Discharge status: Home / Self Care | Payer: BLUE CROSS/BLUE SHIELD | Source: Ambulatory Visit | Attending: Surgery | Admitting: Surgery

## 2016-03-23 ENCOUNTER — Encounter (HOSPITAL_COMMUNITY): Payer: Self-pay

## 2016-03-23 VITALS — BP 130/88 | HR 109 | Temp 98.0°F | Resp 17 | Ht 63.4 in | Wt 187.0 lb

## 2016-03-23 DIAGNOSIS — Z01818 Encounter for other preprocedural examination: Secondary | ICD-10-CM | POA: Insufficient documentation

## 2016-03-23 DIAGNOSIS — E119 Type 2 diabetes mellitus without complications: Secondary | ICD-10-CM | POA: Insufficient documentation

## 2016-03-23 DIAGNOSIS — I10 Essential (primary) hypertension: Secondary | ICD-10-CM | POA: Diagnosis not present

## 2016-03-23 DIAGNOSIS — N62 Hypertrophy of breast: Secondary | ICD-10-CM | POA: Insufficient documentation

## 2016-03-23 DIAGNOSIS — Z01812 Encounter for preprocedural laboratory examination: Secondary | ICD-10-CM | POA: Diagnosis not present

## 2016-03-23 LAB — CBC WITH DIFFERENTIAL/PLATELET
Basophils Absolute: 0.1 10*3/uL (ref 0.0–0.1)
Basophils Relative: 1 %
Eosinophils Absolute: 0.2 10*3/uL (ref 0.0–0.7)
Eosinophils Relative: 2 %
HCT: 54.3 % — ABNORMAL HIGH (ref 39.0–52.0)
Hemoglobin: 18.4 g/dL — ABNORMAL HIGH (ref 13.0–17.0)
Lymphocytes Relative: 28 %
Lymphs Abs: 2.8 10*3/uL (ref 0.7–4.0)
MCH: 29.7 pg (ref 26.0–34.0)
MCHC: 33.9 g/dL (ref 30.0–36.0)
MCV: 87.6 fL (ref 78.0–100.0)
Monocytes Absolute: 0.9 10*3/uL (ref 0.1–1.0)
Monocytes Relative: 9 %
Neutro Abs: 5.9 10*3/uL (ref 1.7–7.7)
Neutrophils Relative %: 60 %
Platelets: 448 10*3/uL — ABNORMAL HIGH (ref 150–400)
RBC: 6.2 MIL/uL — ABNORMAL HIGH (ref 4.22–5.81)
RDW: 13.6 % (ref 11.5–15.5)
WBC: 9.7 10*3/uL (ref 4.0–10.5)

## 2016-03-23 LAB — COMPREHENSIVE METABOLIC PANEL
ALT: 89 U/L — ABNORMAL HIGH (ref 17–63)
AST: 45 U/L — ABNORMAL HIGH (ref 15–41)
Albumin: 4.5 g/dL (ref 3.5–5.0)
Alkaline Phosphatase: 87 U/L (ref 38–126)
Anion gap: 10 (ref 5–15)
BUN: 14 mg/dL (ref 6–20)
CO2: 21 mmol/L — ABNORMAL LOW (ref 22–32)
Calcium: 9.6 mg/dL (ref 8.9–10.3)
Chloride: 105 mmol/L (ref 101–111)
Creatinine, Ser: 0.81 mg/dL (ref 0.61–1.24)
GFR calc Af Amer: >60 mL/min (ref >60)
GFR calc non Af Amer: >60 mL/min (ref >60)
Glucose, Bld: 143 mg/dL — ABNORMAL HIGH (ref 65–99)
Potassium: 4.2 mmol/L (ref 3.5–5.1)
Sodium: 136 mmol/L (ref 135–145)
Total Bilirubin: 0.8 mg/dL (ref 0.3–1.2)
Total Protein: 7.8 g/dL (ref 6.5–8.1)

## 2016-03-23 LAB — GLUCOSE, CAPILLARY: Glucose-Capillary: 154 mg/dL — ABNORMAL HIGH (ref 65–99)

## 2016-03-23 MED ORDER — CANAGLIFLOZIN 100 MG PO TABS: ORAL_TABLET | ORAL | 2 refills | Status: DC

## 2016-03-23 MED ORDER — SITAGLIPTIN PHOS-METFORMIN HCL 50-1000 MG PO TABS: ORAL_TABLET | ORAL | 2 refills | Status: DC

## 2016-03-23 MED ORDER — LISINOPRIL 10 MG PO TABS: ORAL_TABLET | ORAL | 1 refills | Status: DC

## 2016-03-23 NOTE — Patient Instructions (Addendum)
Schedule appointment with eye care provider Follow up after surgery for physical and can check cholesterol at that time.  I will refer you to nutritionist, but look at information below on diet.  We can hold on changes for now, but follow up to discuss Invokana further after surgery. Once I see your hemoglobin A1c, can call you to discuss any changes in medications before your next visit. Good luck with your upcoming surgery.   Diabetes Mellitus and Food It is important for you to manage your blood sugar (glucose) level. Your blood glucose level can be greatly affected by what you eat. Eating healthier foods in the appropriate amounts throughout the day at about the same time each day will help you control your blood glucose level. It can also help slow or prevent worsening of your diabetes mellitus. Healthy eating may even help you improve the level of your blood pressure and reach or maintain a healthy weight.  General recommendations for healthful eating and cooking habits include:  Eating meals and snacks regularly. Avoid going long periods of time without eating to lose weight.  Eating a diet that consists mainly of plant-based foods, such as fruits, vegetables, nuts, legumes, and whole grains.  Using low-heat cooking methods, such as baking, instead of high-heat cooking methods, such as deep frying. Work with your dietitian to make sure you understand how to use the Nutrition Facts information on food labels. HOW CAN FOOD AFFECT ME? Carbohydrates Carbohydrates affect your blood glucose level more than any other type of food. Your dietitian will help you determine how many carbohydrates to eat at each meal and teach you how to count carbohydrates. Counting carbohydrates is important to keep your blood glucose at a healthy level, especially if you are using insulin or taking certain medicines for diabetes mellitus. Alcohol Alcohol can cause sudden decreases in blood glucose (hypoglycemia),  especially if you use insulin or take certain medicines for diabetes mellitus. Hypoglycemia can be a life-threatening condition. Symptoms of hypoglycemia (sleepiness, dizziness, and disorientation) are similar to symptoms of having too much alcohol.  If your health care provider has given you approval to drink alcohol, do so in moderation and use the following guidelines:  Women should not have more than one drink per day, and men should not have more than two drinks per day. One drink is equal to:  12 oz of beer.  5 oz of wine.  1 oz of hard liquor.  Do not drink on an empty stomach.  Keep yourself hydrated. Have water, diet soda, or unsweetened iced tea.  Regular soda, juice, and other mixers might contain a lot of carbohydrates and should be counted. WHAT FOODS ARE NOT RECOMMENDED? As you make food choices, it is important to remember that all foods are not the same. Some foods have fewer nutrients per serving than other foods, even though they might have the same number of calories or carbohydrates. It is difficult to get your body what it needs when you eat foods with fewer nutrients. Examples of foods that you should avoid that are high in calories and carbohydrates but low in nutrients include:  Trans fats (most processed foods list trans fats on the Nutrition Facts label).  Regular soda.  Juice.  Candy.  Sweets, such as cake, pie, doughnuts, and cookies.  Fried foods. WHAT FOODS CAN I EAT? Eat nutrient-rich foods, which will nourish your body and keep you healthy. The food you should eat also will depend on several factors, including:  The calories you need.  The medicines you take.  Your weight.  Your blood glucose level.  Your blood pressure level.  Your cholesterol level. You should eat a variety of foods, including:  Protein.  Lean cuts of meat.  Proteins low in saturated fats, such as fish, egg whites, and beans. Avoid processed meats.  Fruits and  vegetables.  Fruits and vegetables that may help control blood glucose levels, such as apples, mangoes, and yams.  Dairy products.  Choose fat-free or low-fat dairy products, such as milk, yogurt, and cheese.  Grains, bread, pasta, and rice.  Choose whole grain products, such as multigrain bread, whole oats, and brown rice. These foods may help control blood pressure.  Fats.  Foods containing healthful fats, such as nuts, avocado, olive oil, canola oil, and fish. DOES EVERYONE WITH DIABETES MELLITUS HAVE THE SAME MEAL PLAN? Because every person with diabetes mellitus is different, there is not one meal plan that works for everyone. It is very important that you meet with a dietitian who will help you create a meal plan that is just right for you.   This information is not intended to replace advice given to you by your health care provider. Make sure you discuss any questions you have with your health care provider.   Document Released: 04/23/2005 Document Revised: 08/17/2014 Document Reviewed: 06/23/2013 Elsevier Interactive Patient Education 2016 [employer].      IF you received an x-ray today, you will receive an invoice from [facility]. Please contact [facility] at [phone] with questions or concerns regarding your invoice.   IF you received labwork today, you will receive an invoice from [employer]. Please contact [facility] at [phone] with questions or concerns regarding your invoice.   Our billing staff will not be able to assist you with questions regarding bills from these companies.  You will be contacted with the lab results as soon as they are available. The fastest way to get your results is to activate your My Chart account. Instructions are located on the last page of this paperwork. If you have not heard from [place] regarding the results in 2 weeks, please contact this office.

## 2016-03-23 NOTE — Progress Notes (Signed)
Dr Cornett's office called and informed we need a consent order. (Spoke with Sunday Spillers).

## 2016-03-23 NOTE — Progress Notes (Signed)
PCP is Dr. [name] Denies ever seeing a cardiologist.  Denies ever having a stress test, card cath, or echo. Reports his fasting CBG's Run 90-170's

## 2016-03-23 NOTE — Pre-Procedure Instructions (Signed)
Logan [name]  03/23/2016      [facility] [id number] - [name], [place] AT [place] [address] Phone: [phone] Fax: [phone]  Logan [name] [facility] [address], [place] [address] Phone: [phone] Fax: [place] [address], [place] [address] [place] [address] Phone: [phone] Fax: [phone]    Your procedure is scheduled on Aug 22  Report to [facility] at 530 A.M.  Call this number if you have problems the morning of surgery:  [phone]   Remember:  Do not eat food or drink liquids after midnight.  Take these medicines the morning of surgery with A SIP OF WATER Flonase nasal spray, Loratadine (Claritin)  Stop taking aspirin, BC's, Goody's, Herbal medications, Fish Oil, vitamins, Ibuprofen, Advil, Motrin, Aleve    How to Manage Your Diabetes Before and After Surgery  Why is it important to control my blood sugar before and after surgery? . Improving blood sugar levels before and after surgery helps healing and can limit problems. . A way of improving blood sugar control is eating a healthy diet by: o  Eating less sugar and carbohydrates o  Increasing activity/exercise o  Talking with your doctor about reaching your blood sugar goals . High blood sugars (greater than 180 mg/dL) can raise your risk of infections and slow your recovery, so you will need to focus on controlling your diabetes during the weeks before surgery. . Make sure that the doctor who takes care of your diabetes knows about your planned surgery including the date and location.  How do I manage my blood sugar before surgery? . Check your blood sugar at least 4 times a day, starting 2 days before surgery, to make sure that the level is not too high or low. o Check  your blood sugar the morning of your surgery when you wake up and every 2 hours until you get to the Short Stay unit. . If your blood sugar is less than 70 mg/dL, you will need to treat for low blood sugar: o Do not take insulin. o Treat a low blood sugar (less than 70 mg/dL) with  cup of clear juice (cranberry or apple), 4 glucose tablets, OR glucose gel. o Recheck blood sugar in 15 minutes after treatment (to make sure it is greater than 70 mg/dL). If your blood sugar is not greater than 70 mg/dL on recheck, call [phone] for further instructions. . Report your blood sugar to the short stay nurse when you get to Short Stay.  . If you are admitted to the hospital after surgery: o Your blood sugar will be checked by the staff and you will probably be given insulin after surgery (instead of oral diabetes medicines) to make sure you have good blood sugar levels. o The goal for blood sugar control after surgery is 80-180 mg/dL.     WHAT DO I DO ABOUT MY DIABETES MEDICATION?   Marland Kitchen Do not take oral diabetes medicines (pills) the morning of surgery. Invokana or Janumet       . The day of surgery, do not take other diabetes injectables, including Byetta (exenatide), Bydureon (exenatide ER), Victoza (liraglutide), or Trulicity (dulaglutide).  . If your CBG is greater than 220 mg/dL, you may take  of your sliding  scale (correction) dose of insulin.  Other Instructions:          Patient Signature:  Date:   Nurse Signature:  Date:   Reviewed and Endorsed by [facility] Patient Education Committee, August 2015  Do not wear jewelry, make-up or nail polish.  Do not wear lotions, powders, or perfumes.  You may wear deoderant.  Do not shave 48 hours prior to surgery.  Men may shave face and neck.  Do not bring valuables to the hospital.  [facility] is not responsible for any belongings or valuables.  Contacts, dentures or bridgework may not be worn into surgery.  Leave your suitcase in  the car.  After surgery it may be brought to your room.  For patients admitted to the hospital, discharge time will be determined by your treatment team.  Patients discharged the day of surgery will not be allowed to drive home.   Special instructions:  [facility] - Preparing for Surgery  Before surgery, you can play an important role.  Because skin is not sterile, your skin needs to be as free of germs as possible.  You can reduce the number of germs on you skin by washing with CHG (chlorahexidine gluconate) soap before surgery.  CHG is an antiseptic cleaner which kills germs and bonds with the skin to continue killing germs even after washing.  Please DO NOT use if you have an allergy to CHG or antibacterial soaps.  If your skin becomes reddened/irritated stop using the CHG and inform your nurse when you arrive at Short Stay.  Do not shave (including legs and underarms) for at least 48 hours prior to the first CHG shower.  You may shave your face.  Please follow these instructions carefully:   1.  Shower with CHG Soap the night before surgery and the   morning of Surgery.  2.  If you choose to wash your hair, wash your hair first as usual with your  normal shampoo.  3.  After you shampoo, rinse your hair and body thoroughly to remove the   Shampoo.  4.  Use CHG as you would any other liquid soap.  You can apply chg directly   to the skin and wash gently with scrungie or a clean washcloth.  5.  Apply the CHG Soap to your body ONLY FROM THE NECK DOWN.   Do not use on open wounds or open sores.  Avoid contact with your eyes,   ears, mouth and genitals (private parts).  Wash genitals (private parts) with your normal soap.  6.  Wash thoroughly, paying special attention to the area where your surgery  will be performed.  7.  Thoroughly rinse your body with warm water from the neck down.  8.  DO NOT shower/wash with your normal soap after using and rinsing off the CHG Soap.  9.  Pat yourself dry  with a clean towel.            10.  Wear clean pajamas.            11.  Place clean sheets on your bed the night of your first shower and do not    sleep with pets.  Day of Surgery  Do not apply any lotions/deoderants the morning of surgery.  Please wear clean clothes to the hospital/surgery center.     Please read over the following fact sheets that you were given. Pain Booklet, Coughing and Deep Breathing and Surgical Site Infection Prevention

## 2016-03-23 NOTE — Progress Notes (Signed)
Subjective:  By signing my name below, I, [name], attest that this documentation has been prepared under the direction and in the presence of Logan Ray, MD.  Electronically Signed: [name], ED Scribe. 03/23/2016. 5:54 PM.   Patient ID: Logan [name], male    DOB: September 16, 1970, 45 y.o.   MRN: [id number]  HPI Chief Complaint  Patient presents with  . Medication Refill    invokana, lisinopril, janumet  . Other    Requesting A1C check    HPI Comments: Logan [name] is a 45 y.o. male who presents to the [facility] for follow up of DM. Last visit with me 07/2015. He was uncontrolled at that time, continued on same medication but advised to recheck in 3 months.   Lab Results  Component Value Date   HGBA1C 7.4 07/17/2015   Lab Results  Component Value Date   MICROALBUR 1.7 07/17/2015    He is on invokana 100mg  once a day and Janumet.  Pt is compliant with medication and takes them as prescribed. He has a couple weeks left of medication but ran out of prescription refills. He voices some concerns about possible risk of Invokana including heart problems, kidney failure and amputation. He reports having occasional myalgias that he suspects is due to medication. He f/u with this at physical.   Pt has struggled with losing weight. He states he has increased walking 3-4 times a day as well as push ups. He would like a referral to a nutritionist.   He has not been seen by optho but plans to make an appointment after surgery.   Wt Readings from Last 3 Encounters:  03/23/16 187 lb (84.8 kg)  03/23/16 184 lb 4.9 oz (83.6 kg)  07/17/15 183 lb (83 kg)   Hypertension.  Lab Results  Component Value Date   CREATININE 0.81 03/23/2016   Takes lisinopril 10 mg qd.   Hernia He is scheduled for hernia repair and mastectomy on 8/22. He appears that he had pre admitted testing today including CBC,CMP and A1C.   Pt would like to check cholesterol but is not  fasting.   Patient Active Problem List   Diagnosis Date Noted  . Diabetes ([place]) 05/31/2012  . Hypertension 05/31/2012   Past Medical History:  Diagnosis Date  . Allergy   . Diabetes mellitus   . Hernia, umbilical   . Hypertension    No past surgical history on file. Allergies  Allergen Reactions  . Penicillins Hives and Rash    Has patient had a PCN reaction causing immediate rash, facial/tongue/throat swelling, SOB or lightheadedness with hypotension: Yes Has patient had a PCN reaction causing severe rash involving mucus membranes or skin necrosis: No Has patient had a PCN reaction that required hospitalization No Has patient had a PCN reaction occurring within the last 10 years: No If all of the above answers are "NO", then may proceed with Cephalosporin use.    Prior to Admission medications   Medication Sig Start Date End Date Taking? Authorizing Provider  aspirin EC 81 MG tablet Take 81 mg by mouth daily.   Yes Historical Provider, MD  B Complex-C (B-COMPLEX WITH VITAMIN C) tablet Take 1 tablet by mouth daily.   Yes Historical Provider, MD  fluticasone (FLONASE) 50 MCG/ACT nasal spray Place 1 spray into both nostrils daily.   Yes Historical Provider, MD  INVOKANA 100 MG TABS tablet TAKE 1 TABLET (100 MG TOTAL) BY MOUTH DAILY. 11/04/15  Yes  Gay Filler Copland, MD  JANUMET 50-1000 MG tablet TAKE 1 TABLET BY MOUTH 2 (TWO) TIMES DAILY WITH A MEAL. 11/04/15  Yes Gay Filler Copland, MD  lisinopril (PRINIVIL,ZESTRIL) 10 MG tablet TAKE 1 TABLET (10 MG TOTAL) BY MOUTH DAILY 07/17/15  Yes [name], MD  loratadine (CLARITIN) 10 MG tablet Take 10 mg by mouth daily.   Yes Historical Provider, MD  vitamin B-12 (CYANOCOBALAMIN) 1000 MCG tablet Take 1,000 mcg by mouth daily.   Yes Historical Provider, MD  vitamin C (ASCORBIC ACID) 500 MG tablet Take 500 mg by mouth daily.   Yes Historical Provider, MD   Social History   Social History  . Marital status: Single    Spouse name: N/A  .  Number of children: N/A  . Years of education: N/A   Occupational History  . Not on file.   Social History Main Topics  . Smoking status: Never Smoker  . Smokeless tobacco: Never Used  . Alcohol use Yes     Comment: 1-2 few times a month  . Drug use: No  . Sexual activity: Not on file   Other Topics Concern  . Not on file   Social History Narrative  . No narrative on file   Review of Systems  Constitutional: Negative for fatigue and unexpected weight change.  Eyes: Negative for visual disturbance.  Respiratory: Negative for cough, chest tightness and shortness of breath.   Cardiovascular: Negative for chest pain, palpitations and leg swelling.  Gastrointestinal: Negative for abdominal pain and blood in stool.  Musculoskeletal: Positive for myalgias.  Neurological: Negative for dizziness, light-headedness and headaches.       Objective:   Physical Exam  Constitutional: He is oriented to person, place, and time. He appears well-developed and well-nourished. No distress.  HENT:  Head: Normocephalic and atraumatic.  Eyes: Conjunctivae and EOM are normal. Pupils are equal, round, and reactive to light.  Neck: Neck supple. No JVD present. Carotid bruit is not present.  Cardiovascular: Normal rate, regular rhythm and normal heart sounds.   No murmur heard. Pulmonary/Chest: Effort normal and breath sounds normal. He has no rales.  Abdominal: A hernia ( umbilical ) is present.  Musculoskeletal: Normal range of motion. He exhibits no edema.  Neurological: He is alert and oriented to person, place, and time.  microfilament testing is normal.   Skin: Skin is warm and dry.  Psychiatric: He has a normal mood and affect. His behavior is normal.  Nursing note and vitals reviewed.  Vitals:   03/23/16 1705  BP: 130/88  Pulse: (!) 109  Resp: 17  Temp: 98 F (36.7 C)  TempSrc: Oral  SpO2: 97%  Weight: 187 lb (84.8 kg)  Height: 5' 3.4" (1.61 m)   Assessment & Plan:   Logan [name] is a 45 y.o. male Type 2 diabetes mellitus without complication, without long-term current use of insulin (HCC) - Plan: canagliflozin (INVOKANA) 100 MG TABS tablet, sitaGLIPtin-metformin (JANUMET) 50-1000 MG tablet, Amb ref to Medical Nutrition Therapy-MNT  -Check hemoglobin A1c. Schedule appointment with also. Continue same dose of medications for now, can discuss Invokana further at next visit, but did discuss slight increase risk of LE amputations, but more concerning in those with underlying peripheral vascular disease. Can discuss other options at next visit. He would like to remain on Invokana for now.  Essential hypertension - Plan: lisinopril (PRINIVIL,ZESTRIL) 10 MG tablet  - Stable. No med changes.   Meds ordered this encounter  Medications  .  canagliflozin (INVOKANA) 100 MG TABS tablet    Sig: TAKE 1 TABLET (100 MG TOTAL) BY MOUTH DAILY.    Dispense:  30 tablet    Refill:  2  . sitaGLIPtin-metformin (JANUMET) 50-1000 MG tablet    Sig: TAKE 1 TABLET BY MOUTH 2 (TWO) TIMES DAILY WITH A MEAL.    Dispense:  60 tablet    Refill:  2  . lisinopril (PRINIVIL,ZESTRIL) 10 MG tablet    Sig: TAKE 1 TABLET (10 MG TOTAL) BY MOUTH DAILY    Dispense:  90 tablet    Refill:  1   Patient Instructions   Schedule appointment with eye care provider Follow up after surgery for physical and can check cholesterol at that time.  I will refer you to nutritionist, but look at information below on diet.  We can hold on changes for now, but follow up to discuss Invokana further after surgery. Once I see your hemoglobin A1c, can call you to discuss any changes in medications before your next visit. Good luck with your upcoming surgery.   Diabetes Mellitus and Food It is important for you to manage your blood sugar (glucose) level. Your blood glucose level can be greatly affected by what you eat. Eating healthier foods in the appropriate amounts throughout the day at about the same time each day  will help you control your blood glucose level. It can also help slow or prevent worsening of your diabetes mellitus. Healthy eating may even help you improve the level of your blood pressure and reach or maintain a healthy weight.  General recommendations for healthful eating and cooking habits include:  Eating meals and snacks regularly. Avoid going long periods of time without eating to lose weight.  Eating a diet that consists mainly of plant-based foods, such as fruits, vegetables, nuts, legumes, and whole grains.  Using low-heat cooking methods, such as baking, instead of high-heat cooking methods, such as deep frying. Work with your dietitian to make sure you understand how to use the Nutrition Facts information on food labels. HOW CAN FOOD AFFECT ME? Carbohydrates Carbohydrates affect your blood glucose level more than any other type of food. Your dietitian will help you determine how many carbohydrates to eat at each meal and teach you how to count carbohydrates. Counting carbohydrates is important to keep your blood glucose at a healthy level, especially if you are using insulin or taking certain medicines for diabetes mellitus. Alcohol Alcohol can cause sudden decreases in blood glucose (hypoglycemia), especially if you use insulin or take certain medicines for diabetes mellitus. Hypoglycemia can be a life-threatening condition. Symptoms of hypoglycemia (sleepiness, dizziness, and disorientation) are similar to symptoms of having too much alcohol.  If your health care provider has given you approval to drink alcohol, do so in moderation and use the following guidelines:  Women should not have more than one drink per day, and men should not have more than two drinks per day. One drink is equal to:  12 oz of beer.  5 oz of wine.  1 oz of hard liquor.  Do not drink on an empty stomach.  Keep yourself hydrated. Have water, diet soda, or unsweetened iced tea.  Regular soda, juice,  and other mixers might contain a lot of carbohydrates and should be counted. WHAT FOODS ARE NOT RECOMMENDED? As you make food choices, it is important to remember that all foods are not the same. Some foods have fewer nutrients per serving than other foods, even though they  might have the same number of calories or carbohydrates. It is difficult to get your body what it needs when you eat foods with fewer nutrients. Examples of foods that you should avoid that are high in calories and carbohydrates but low in nutrients include:  Trans fats (most processed foods list trans fats on the Nutrition Facts label).  Regular soda.  Juice.  Candy.  Sweets, such as cake, pie, doughnuts, and cookies.  Fried foods. WHAT FOODS CAN I EAT? Eat nutrient-rich foods, which will nourish your body and keep you healthy. The food you should eat also will depend on several factors, including:  The calories you need.  The medicines you take.  Your weight.  Your blood glucose level.  Your blood pressure level.  Your cholesterol level. You should eat a variety of foods, including:  Protein.  Lean cuts of meat.  Proteins low in saturated fats, such as fish, egg whites, and beans. Avoid processed meats.  Fruits and vegetables.  Fruits and vegetables that may help control blood glucose levels, such as apples, mangoes, and yams.  Dairy products.  Choose fat-free or low-fat dairy products, such as milk, yogurt, and cheese.  Grains, bread, pasta, and rice.  Choose whole grain products, such as multigrain bread, whole oats, and brown rice. These foods may help control blood pressure.  Fats.  Foods containing healthful fats, such as nuts, avocado, olive oil, canola oil, and fish. DOES EVERYONE WITH DIABETES MELLITUS HAVE THE SAME MEAL PLAN? Because every person with diabetes mellitus is different, there is not one meal plan that works for everyone. It is very important that you meet with a  dietitian who will help you create a meal plan that is just right for you.   This information is not intended to replace advice given to you by your health care provider. Make sure you discuss any questions you have with your health care provider.   Document Released: 04/23/2005 Document Revised: 08/17/2014 Document Reviewed: 06/23/2013 Elsevier Interactive Patient Education 2016 [employer].      IF you received an x-[name] today, you will receive an invoice from [facility]. Please contact [facility] at [phone] with questions or concerns regarding your invoice.   IF you received labwork today, you will receive an invoice from [employer]. Please contact [facility] at [phone] with questions or concerns regarding your invoice.   Our billing staff will not be able to assist you with questions regarding bills from these companies.  You will be contacted with the lab results as soon as they are available. The fastest way to get your results is to activate your My Chart account. Instructions are located on the last page of this paperwork. If you have not heard from [place] regarding the results in 2 weeks, please contact this office.        I personally performed the services described in this documentation, which was scribed in my presence. The recorded information has been reviewed and considered, and addended by me as needed.   Signed,   Logan Ray, MD [facility].  03/25/16 11:34 PM

## 2016-03-24 LAB — HEMOGLOBIN A1C
Hgb A1c MFr Bld: 8.8 % — ABNORMAL HIGH (ref 4.8–5.6)
Mean Plasma Glucose: 206 mg/dL

## 2016-04-02 ENCOUNTER — Encounter: Payer: Self-pay | Admitting: Dietician

## 2016-04-02 ENCOUNTER — Encounter: Payer: BLUE CROSS/BLUE SHIELD | Attending: Family Medicine | Admitting: Dietician

## 2016-04-02 DIAGNOSIS — Z713 Dietary counseling and surveillance: Secondary | ICD-10-CM | POA: Diagnosis not present

## 2016-04-02 DIAGNOSIS — E119 Type 2 diabetes mellitus without complications: Secondary | ICD-10-CM | POA: Diagnosis not present

## 2016-04-02 DIAGNOSIS — E0800 Diabetes mellitus due to underlying condition with hyperosmolarity without nonketotic hyperglycemic-hyperosmolar coma (NKHHC): Secondary | ICD-10-CM

## 2016-04-02 NOTE — Progress Notes (Signed)
Diabetes Self-Management Education  Visit Type: First/Initial  Appt. Start Time: 1045 Appt. End Time: 1215  04/02/2016  [name], identified by name and date of birth, is a 45 y.o. male with a diagnosis of Diabetes: Type 2.   Other hx includes HTN and hyperlipidemia.  Cholesterol 255, Triglycerides 83, HDL 44, LDL 194.  Patient is here today due to [facility] of 8.8% 03/10/2016.  His hernia surgery was denied until his A1C was reduced to 7.1%.  He has started exercising and reducing sweet intake.  He is currently on Invokana and Janumet.  Often skips meals until a late night dinner.  Patient lives alone.  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  He does cook and prepares things in advance at times.  He has started walking during his down time between clients and has been using apps to find parks close to him.  ASSESSMENT  Height 5\' 4"  (1.626 m), weight 185 lb (83.9 kg). Body mass index is 31.76 kg/m.  170 lbs in late 30's      Diabetes Self-Management Education - 04/02/16 1042      Visit Information   Visit Type First/Initial     Initial Visit   Diabetes Type Type 2   Are you currently following a meal plan? No   Are you taking your medications as prescribed? Yes   Date Diagnosed 2010     Health Coping   How would you rate your overall health? Good     Psychosocial Assessment   Patient Belief/Attitude about Diabetes Motivated to manage diabetes   Self-care barriers None   Self-management support Doctor's office;Family   Other persons present Patient   Patient Concerns Nutrition/Meal planning;Glycemic Control   Special Needs None   Preferred Learning Style No preference indicated   Learning Readiness Ready   How often do you need to have someone help you when you read instructions, pamphlets, or other written materials from your doctor or pharmacy? 1 - Never   What is the last grade level you  completed in school? 2 years college     Pre-Education Assessment   Patient understands the diabetes disease and treatment process. Needs Review   Patient understands incorporating nutritional management into lifestyle. Needs Review   Patient undertands incorporating physical activity into lifestyle. Needs Review   Patient understands using medications safely. Demonstrates understanding / competency   Patient understands monitoring blood glucose, interpreting and using results Needs Review   Patient understands prevention, detection, and treatment of acute complications. Needs Review   Patient understands prevention, detection, and treatment of chronic complications. Needs Review   Patient understands how to develop strategies to address psychosocial issues. Needs Review   Patient understands how to develop strategies to promote health/change behavior. Needs Review     Complications   Last HgB A1C per patient/outside source 8.8 %  03/10/16 increased from 07/17/15   How often do you check your blood sugar? 1-2 times/day  morning and before bed but sometimes skips   Fasting Blood glucose range (mg/dL) 70-129;130-179   Postprandial Blood glucose range (mg/dL) 130-179;70-129   Number of hypoglycemic episodes per month 0   Number of hyperglycemic episodes per week 0   Have you had a dilated eye exam in the past 12 months? No  scheduled   Have you had a dental exam in the past 12 months? Yes   Are you  checking your feet? Yes   How many days per week are you checking your feet? 4     Dietary Intake   Breakfast skips offten OR egg or omelet and occasional sausage and toast OR smooth (spinach, flax seeds, 1 cup fruit, unsweetened green tea)  8-10   Snack (morning) --  8-10   Lunch sometimes skips OR Asian (vegetables, chicken, noodles occasionally or white rice) OR wrap from tropical smoothie cafe OR golden coral (salad, meat, vegetables)   Snack (afternoon) raw vegies or jerkey   [employer], kilbasa with rice and vegetables,, hot dogs, rice and beans, bean burito   7-10 or later   Snack (evening) none   Beverage(s) water, seltzer water, occasional diet soda, milk, coffee with stevia and half and half, cappicino, occasional green tea     Exercise   Exercise Type Light (walking / raking leaves)   How many days per week to you exercise? 3   How many minutes per day do you exercise? 30   Total minutes per week of exercise 90     Patient Education   Previous Diabetes Education Yes (please comment)  patient states minimal   Disease state  Definition of diabetes, type 1 and 2, and the diagnosis of diabetes;Factors that contribute to the development of diabetes   Nutrition management  Role of diet in the treatment of diabetes and the relationship between the three main macronutrients and blood glucose level;Food label reading, portion sizes and measuring food.;Carbohydrate counting;Meal options for control of blood glucose level and chronic complications.;Information on hints to eating out and maintain blood glucose control.   Physical activity and exercise  Role of exercise on diabetes management, blood pressure control and cardiac health.   Monitoring Identified appropriate SMBG and/or A1C goals.;Daily foot exams   Chronic complications Lipid levels, blood glucose control and heart disease;Relationship between chronic complications and blood glucose control;Dental care;Retinopathy and reason for yearly dilated eye exams;Assessed and discussed foot care and prevention of foot problems   Psychosocial adjustment Role of stress on diabetes     Individualized Goals (developed by patient)   Nutrition General guidelines for healthy choices and portions discussed;Follow meal plan discussed   Physical Activity Exercise 5-7 days per week;30 minutes per day   Medications take my medication as prescribed   Monitoring  test my blood glucose as discussed   Reducing Risk examine blood  glucose patterns   Health Coping discuss diabetes with (comment)  MD/RD     Post-Education Assessment   Patient understands the diabetes disease and treatment process. Demonstrates understanding / competency   Patient understands incorporating nutritional management into lifestyle. Demonstrates understanding / competency   Patient undertands incorporating physical activity into lifestyle. Demonstrates understanding / competency   Patient understands using medications safely. Demonstrates understanding / competency   Patient understands monitoring blood glucose, interpreting and using results Demonstrates understanding / competency   Patient understands prevention, detection, and treatment of acute complications. Demonstrates understanding / competency   Patient understands prevention, detection, and treatment of chronic complications. Demonstrates understanding / competency   Patient understands how to develop strategies to address psychosocial issues. Demonstrates understanding / competency   Patient understands how to develop strategies to promote health/change behavior. Demonstrates understanding / competency     Outcomes   Expected Outcomes Demonstrated interest in learning. Expect positive outcomes   Future DMSE 4-6 wks   Program Status Completed      Individualized Plan for Diabetes Self-Management Training:  Learning Objective:  Patient will have a greater understanding of diabetes self-management. Patient education plan is to attend individual and/or group sessions per assessed needs and concerns.   Plan:   Patient Instructions  Be as active as possible.  Aim for 30-60 minutes most days. Increase the fiber (whole grains, non starchy vegetables). Great job on the changes that you have made. Avoid skipping meals  Plan:  Aim for 3 Carb Choices per meal (45 grams) +/- 1 either way  Aim for 0-15 Carbs per snack if hungry  Include protein in moderation with your meals and  snacks Consider reading food labels for Total Carbohydrate and Fat Grams of foods Consider checking BG at alternate times per day as directed by MD  Consider taking medication as directed by MD    Expected Outcomes:  Demonstrated interest in learning. Expect positive outcomes  Education material provided: Living Well with Diabetes, Food label handouts, A1C conversion sheet, Meal plan card, My Plate and Snack sheet, breakfast list  If problems or questions, patient to contact team via:  Phone and Email  Future DSME appointment: 4-6 wks

## 2016-04-02 NOTE — Patient Instructions (Signed)
Be as active as possible.  Aim for 30-60 minutes most days. Increase the fiber (whole grains, non starchy vegetables). Great job on the changes that you have made. Avoid skipping meals  Plan:  Aim for 3 Carb Choices per meal (45 grams) +/- 1 either way  Aim for 0-15 Carbs per snack if hungry  Include protein in moderation with your meals and snacks Consider reading food labels for Total Carbohydrate and Fat Grams of foods Consider checking BG at alternate times per day as directed by MD  Consider taking medication as directed by MD

## 2016-04-27 NOTE — Pre-Procedure Instructions (Signed)
Logan [name]  04/27/2016      [facility] [name], [place] [address] [place] [address] Phone: [phone] Fax: [phone]  Logan [name] [facility] [address], [place] - [place] [address] [place] [address] Phone: [phone] Fax: [place] [address] [place] [address] [place] [address] Phone: [phone] Fax: [phone]    Your procedure is scheduled on Thurs, Sept 28 @ 7:30 AM  Report to [place] at 5:30 AM  Call this number if you have problems the morning of surgery:  [phone]   Remember:  Do not eat food or drink liquids after midnight.  Take these medicines the morning of surgery with A SIP OF WATER Flonase(Fluticasone) and Claritin(Loratadine)               Stop taking your Aspirin along with any Vitamins or Herbal Medications. No Goody's,BC's,Aleve,Advil,Motrin,Ibuprofen,or Fish Oil.     How to Manage Your Diabetes Before and After Surgery  Why is it important to control my blood sugar before and after surgery? . Improving blood sugar levels before and after surgery helps healing and can limit problems. . A way of improving blood sugar control is eating a healthy diet by: o  Eating less sugar and carbohydrates o  Increasing activity/exercise o  Talking with your doctor about reaching your blood sugar goals . High blood sugars (greater than 180 mg/dL) can raise your risk of infections and slow your recovery, so you will need to focus on controlling your diabetes during the weeks before surgery. . Make sure that the doctor who takes care of your diabetes knows about your planned surgery including the date and location.  How do I manage my blood sugar before surgery? . Check your blood sugar at least 4 times a day, starting 2 days before surgery, to  make sure that the level is not too high or low. o Check your blood sugar the morning of your surgery when you wake up and every 2 hours until you get to the Short Stay unit. . If your blood sugar is less than 70 mg/dL, you will need to treat for low blood sugar: o Do not take insulin. o Treat a low blood sugar (less than 70 mg/dL) with  cup of clear juice (cranberry or apple), 4 glucose tablets, OR glucose gel. o Recheck blood sugar in 15 minutes after treatment (to make sure it is greater than 70 mg/dL). If your blood sugar is not greater than 70 mg/dL on recheck, call [phone] for further instructions. . Report your blood sugar to the short stay nurse when you get to Short Stay.  . If you are admitted to the hospital after surgery: o Your blood sugar will be checked by the staff and you will probably be given insulin after surgery (instead of oral diabetes medicines) to make sure you have good blood sugar levels. o The goal for blood sugar control after surgery is 80-180 mg/dL.              WHAT DO I DO ABOUT MY DIABETES MEDICATION?   Marland Kitchen Do not take oral diabetes medicines (pills) the morning of surgery.    . The day of surgery, do not take other diabetes injectables, including Byetta (exenatide), Bydureon (exenatide ER), Victoza (liraglutide),  or Trulicity (dulaglutide).  . If your CBG is greater than 220 mg/dL, you may take  of your sliding scale (correction) dose of insulin.  Other Instructions:          Patient Signature:  Date:   Nurse Signature:  Date:   Reviewed and Endorsed by [facility] Patient Education Committee, August 2015   Do not wear jewelry.  Do not wear lotions, powders, colognes, or deoderant.             Men may shave face and neck.  Do not bring valuables to the hospital.  [facility] is not responsible for any belongings or valuables.  Contacts, dentures or bridgework may not be worn into surgery.  Leave your suitcase in the car.   After surgery it may be brought to your room.  For patients admitted to the hospital, discharge time will be determined by your treatment team.  Patients discharged the day of surgery will not be allowed to drive home.    [facility] - Preparing for Surgery  Before surgery, you can play an important role.  Because skin is not sterile, your skin needs to be as free of germs as possible.  You can reduce the number of germs on you skin by washing with CHG (chlorahexidine gluconate) soap before surgery.  CHG is an antiseptic cleaner which kills germs and bonds with the skin to continue killing germs even after washing.  Please DO NOT use if you have an allergy to CHG or antibacterial soaps.  If your skin becomes reddened/irritated stop using the CHG and inform your nurse when you arrive at Short Stay.  Do not shave (including legs and underarms) for at least 48 hours prior to the first CHG shower.  You may shave your face.  Please follow these instructions carefully:   1.  Shower with CHG Soap the night before surgery and the                                morning of Surgery.  2.  If you choose to wash your hair, wash your hair first as usual with your       normal shampoo.  3.  After you shampoo, rinse your hair and body thoroughly to remove the                      Shampoo.  4.  Use CHG as you would any other liquid soap.  You can apply chg directly       to the skin and wash gently with scrungie or a clean washcloth.  5.  Apply the CHG Soap to your body ONLY FROM THE NECK DOWN.        Do not use on open wounds or open sores.  Avoid contact with your eyes,       ears, mouth and genitals (private parts).  Wash genitals (private parts)       with your normal soap.  6.  Wash thoroughly, paying special attention to the area where your surgery        will be performed.  7.  Thoroughly rinse your body with warm water from the neck down.  8.  DO NOT shower/wash with your normal soap after using and  rinsing off       the CHG Soap.  9.  Pat yourself dry with a clean towel.  10.  Wear clean pajamas.            11.  Place clean sheets on your bed the night of your first shower and do not        sleep with pets.  Day of Surgery  Do not apply any lotions/deoderants the morning of surgery.  Please wear clean clothes to the hospital/surgery center.    Please read over the following fact sheets that you were given. Pain Booklet, Coughing and Deep Breathing and Surgical Site Infection Prevention

## 2016-04-28 ENCOUNTER — Ambulatory Visit: Payer: Self-pay | Admitting: Surgery

## 2016-04-28 ENCOUNTER — Encounter (HOSPITAL_COMMUNITY)
Admission: RE | Admit: 2016-04-28 | Discharge: 2016-04-28 | Disposition: A | Discharge status: Home / Self Care | Payer: BLUE CROSS/BLUE SHIELD | Source: Ambulatory Visit | Attending: Surgery | Admitting: Surgery

## 2016-04-28 ENCOUNTER — Encounter (HOSPITAL_COMMUNITY): Payer: Self-pay

## 2016-04-28 DIAGNOSIS — K429 Umbilical hernia without obstruction or gangrene: Secondary | ICD-10-CM | POA: Diagnosis not present

## 2016-04-28 DIAGNOSIS — Z01812 Encounter for preprocedural laboratory examination: Secondary | ICD-10-CM | POA: Insufficient documentation

## 2016-04-28 DIAGNOSIS — I10 Essential (primary) hypertension: Secondary | ICD-10-CM | POA: Insufficient documentation

## 2016-04-28 DIAGNOSIS — E119 Type 2 diabetes mellitus without complications: Secondary | ICD-10-CM | POA: Insufficient documentation

## 2016-04-28 HISTORY — DX: Other seasonal allergic rhinitis: J30.2

## 2016-04-28 LAB — CBC
HCT: 50.7 % (ref 39.0–52.0)
Hemoglobin: 16.8 g/dL (ref 13.0–17.0)
MCH: 29.3 pg (ref 26.0–34.0)
MCHC: 33.1 g/dL (ref 30.0–36.0)
MCV: 88.5 fL (ref 78.0–100.0)
Platelets: 434 10*3/uL — ABNORMAL HIGH (ref 150–400)
RBC: 5.73 MIL/uL (ref 4.22–5.81)
RDW: 13.9 % (ref 11.5–15.5)
WBC: 9.7 10*3/uL (ref 4.0–10.5)

## 2016-04-28 LAB — GLUCOSE, CAPILLARY: Glucose-Capillary: 161 mg/dL — ABNORMAL HIGH (ref 65–99)

## 2016-04-28 LAB — BASIC METABOLIC PANEL
Anion gap: 8 (ref 5–15)
BUN: 14 mg/dL (ref 6–20)
CO2: 24 mmol/L (ref 22–32)
Calcium: 9.3 mg/dL (ref 8.9–10.3)
Chloride: 106 mmol/L (ref 101–111)
Creatinine, Ser: 0.68 mg/dL (ref 0.61–1.24)
GFR calc Af Amer: >60 mL/min (ref >60)
GFR calc non Af Amer: >60 mL/min (ref >60)
Glucose, Bld: 186 mg/dL — ABNORMAL HIGH (ref 65–99)
Potassium: 4 mmol/L (ref 3.5–5.1)
Sodium: 138 mmol/L (ref 135–145)

## 2016-04-28 MED ORDER — CHLORHEXIDINE GLUCONATE CLOTH 2 % EX PADS: 6.0000 | MEDICATED_PAD | Freq: Once | CUTANEOUS | Status: DC

## 2016-04-28 NOTE — Progress Notes (Addendum)
Cardiologist denies  Medical Md is Dr.Jeffrey Carlota Raspberry  Echo denies  Stress test denies  Heart cath denies  EKG in epic from 03-23-16  CXR denies

## 2016-04-29 ENCOUNTER — Ambulatory Visit: Payer: Self-pay | Admitting: Surgery

## 2016-04-29 NOTE — Progress Notes (Signed)
Anesthesia Chart Review: Patient is a 45 year old male posted for repair of ventral hernia on 05/07/16 by Dr. Brantley Stage. Surgery was initially scheduled for last month (and was to include mastectomies for gynecomastia), but Dr. Brantley Stage postponed due to A1c 8.8 (on 03/23/16). He has since been re-evaluated by Dr. [name] on 03/23/16. He doesn't look like DM meds were adjusted, but he was seen by a dietician. (Orders/consent are still pending, but I called and spoke with [facility] triage nurse Amy who reported that there notations indicate that Dr. Brantley Stage has reviewed labs and thinks patient can proceed with ventral hernia repair, but will not do mastectomies at this time.)   History includes nonsmoker, diabetes mellitus type 2, hypertension. BMI is consistent with obesity.  Meds include aspirin 81 mg, Invokana, Flonase, lisinopril, Claritin, Janumet.  BP 133/87   Pulse 89   Temp 36.7 C   Resp 20   Ht 5\' 4"  (1.626 m)   Wt 186 lb 11.2 oz (84.7 kg)   SpO2 98%   BMI 32.05 kg/m   03/23/16 EKG: NSR.  Preoperative labs noted. He will get a fasting CBG on arrival. If results are acceptable and otherwise no acute changes then I would anticipate that he could proceed as planned.  Logan [name] [facility] Short Stay Center/Anesthesiology Phone [phone] 04/29/2016 3:42 PM

## 2016-05-04 ENCOUNTER — Encounter: Payer: BLUE CROSS/BLUE SHIELD | Attending: Family Medicine | Admitting: Dietician

## 2016-05-04 ENCOUNTER — Ambulatory Visit (INDEPENDENT_AMBULATORY_CARE_PROVIDER_SITE_OTHER): Payer: BLUE CROSS/BLUE SHIELD | Admitting: Family Medicine

## 2016-05-04 VITALS — BP 122/74 | HR 107 | Temp 98.0°F | Resp 18 | Ht 64.0 in | Wt 184.0 lb

## 2016-05-04 DIAGNOSIS — M5412 Radiculopathy, cervical region: Secondary | ICD-10-CM | POA: Diagnosis not present

## 2016-05-04 DIAGNOSIS — E119 Type 2 diabetes mellitus without complications: Secondary | ICD-10-CM | POA: Diagnosis not present

## 2016-05-04 DIAGNOSIS — M542 Cervicalgia: Secondary | ICD-10-CM | POA: Diagnosis not present

## 2016-05-04 DIAGNOSIS — Z713 Dietary counseling and surveillance: Secondary | ICD-10-CM | POA: Diagnosis present

## 2016-05-04 MED ORDER — CYCLOBENZAPRINE HCL 5 MG PO TABS: ORAL_TABLET | ORAL | 0 refills | Status: DC

## 2016-05-04 MED ORDER — HYDROCODONE-ACETAMINOPHEN 5-325 MG PO TABS: 1.0000 | ORAL_TABLET | Freq: Four times a day (QID) | ORAL | 0 refills | Status: DC | PRN

## 2016-05-04 MED ORDER — MELOXICAM 7.5 MG PO TABS: 7.5000 mg | ORAL_TABLET | Freq: Every day | ORAL | 0 refills | Status: DC

## 2016-05-04 NOTE — Patient Instructions (Signed)
Great job on the changes that you have made.  Get back into the exercise. Limit saturated fat (solid fat, chicken skin) and added oils.

## 2016-05-04 NOTE — Patient Instructions (Addendum)
Meloxicam once per day, make sure your surgeon is okay with you taking this. Flexeril up to every 8 hours as needed, and if needed for breakthrough pain, I did write you for a few hydrocodone. Be careful combining this with other sedating medications including the muscle relaxant. If your symptoms have not improved into next week, return for recheck as x-ray would be needed at time or possible prednisone depending on your symptoms.  I am hesitant to start prednisone now with your upcoming surgery and diabetes.. Let me know if you have questions.    Cervical Radiculopathy Cervical radiculopathy happens when a nerve in the neck (cervical nerve) is pinched or bruised. This condition can develop because of an injury or as part of the normal aging process. Pressure on the cervical nerves can cause pain or numbness that runs from the neck all the way down into the arm and fingers. Usually, this condition gets better with rest. Treatment may be needed if the condition does not improve.  CAUSES This condition may be caused by:  Injury.  Slipped (herniated) disk.  Muscle tightness in the neck because of overuse.  Arthritis.  Breakdown or degeneration in the bones and joints of the spine (spondylosis) due to aging.  Bone spurs that may develop near the cervical nerves. SYMPTOMS Symptoms of this condition include:  Pain that runs from the neck to the arm and hand. The pain can be severe or irritating. It may be worse when the neck is moved.  Numbness or weakness in the affected arm and hand. DIAGNOSIS This condition may be diagnosed based on symptoms, medical history, and a physical exam. You may also have tests, including:  X-rays.  CT scan.  MRI.  Electromyogram (EMG).  Nerve conduction tests. TREATMENT In many cases, treatment is not needed for this condition. With rest, the condition usually gets better over time. If treatment is needed, options may include:  Wearing a soft neck  collar for short periods of time.  Physical therapy to strengthen your neck muscles.  Medicines, such as NSAIDs, oral corticosteroids, or spinal injections.  Surgery. This may be needed if other treatments do not help. Various types of surgery may be done depending on the cause of your problems. HOME CARE INSTRUCTIONS Managing Pain  Take over-the-counter and prescription medicines only as told by your health care provider.  If directed, apply ice to the affected area.  Put ice in a plastic bag.  Place a towel between your skin and the bag.  Leave the ice on for 20 minutes, 2-3 times per day.  If ice does not help, you can try using heat. Take a warm shower or warm bath, or use a heat Laderrick Wilk as told by your health care provider.  Try a gentle neck and shoulder massage to help relieve symptoms. Activity  Rest as needed. Follow instructions from your health care provider about any restrictions on activities.  Do stretching and strengthening exercises as told by your health care provider or physical therapist. General Instructions  If you were given a soft collar, wear it as told by your health care provider.  Use a flat pillow when you sleep.  Keep all follow-up visits as told by your health care provider. This is important. SEEK MEDICAL CARE IF:  Your condition does not improve with treatment. SEEK IMMEDIATE MEDICAL CARE IF:  Your pain gets much worse and cannot be controlled with medicines.  You have weakness or numbness in your hand, arm, face, or  leg.  You have a high fever.  You have a stiff, rigid neck.  You lose control of your bowels or your bladder (have incontinence).  You have trouble with walking, balance, or speaking.   This information is not intended to replace advice given to you by your health care provider. Make sure you discuss any questions you have with your health care provider.   Document Released: 04/21/2001 Document Revised: 04/17/2015  Document Reviewed: 09/20/2014 Elsevier Interactive Patient Education [employer].   IF you received an x-ray today, you will receive an invoice from [facility]. Please contact [facility] at [phone] with questions or concerns regarding your invoice.   IF you received labwork today, you will receive an invoice from [employer]. Please contact [facility] at [phone] with questions or concerns regarding your invoice.   Our billing staff will not be able to assist you with questions regarding bills from these companies.  You will be contacted with the lab results as soon as they are available. The fastest way to get your results is to activate your My Chart account. Instructions are located on the last page of this paperwork. If you have not heard from [place] regarding the results in 2 weeks, please contact this office.

## 2016-05-04 NOTE — Progress Notes (Signed)
By signing my name below I, Tereasa Coop, attest that this documentation has been prepared under the direction and in the presence of [name], MD. Electonically Signed. Tereasa Coop, Scribe 05/04/2016 at 4:51 PM  Subjective:    Patient ID: Logan [name], male    DOB: 1970-11-05, 45 y.o.   MRN: [id number]  Chief Complaint  Patient presents with  . Neck Pain    on the right side that radiates to right shoulder and right side of upper back     HPI Logan [name] is a 45 y.o. male who presents to the [facility] complaining of rt sided neck pain that radiates into his rt shoulder for the past 2.5 weeks. Pain in arm is described as a constant dull ache. Pt states the arm feels mildly weaker and has had some mild intermittent numbness. Pt states he was falling asleep on his couch multiple times in a week with his head slumped over, and woke up with the neck pain and thought it would go away on its own. Pain started worsening 2 weeks ago and started radiating to his rt upper arm. Pain worsened by looking up.   History of type II DM.  Pt is scheduled to have a umbilical hernia repair surgery in 3 days.    Patient Active Problem List   Diagnosis Date Noted  . Diabetes ([place]) 05/31/2012  . Hypertension 05/31/2012   Past Medical History:  Diagnosis Date  . Diabetes mellitus    takes Janumet and Invokana daily  . Hernia, umbilical   . Hypertension    takes Lisinopril daily  . Seasonal allergies    takes Claritin daily and uses Flonase daily   Past Surgical History:  Procedure Laterality Date  . NO PAST SURGERIES     Allergies  Allergen Reactions  . Penicillins Hives and Rash    Has patient had a PCN reaction causing immediate rash, facial/tongue/throat swelling, SOB or lightheadedness with hypotension: Yes Has patient had a PCN reaction causing severe rash involving mucus membranes or skin necrosis: No Has patient had a PCN reaction that required  hospitalization No Has patient had a PCN reaction occurring within the last 10 years: No If all of the above answers are "NO", then may proceed with Cephalosporin use.    Prior to Admission medications   Medication Sig Start Date End Date Taking? Authorizing Provider  aspirin EC 81 MG tablet Take 81 mg by mouth daily.   Yes Historical Provider, MD  B Complex-C (B-COMPLEX WITH VITAMIN C) tablet Take 1 tablet by mouth daily.   Yes Historical Provider, MD  canagliflozin (INVOKANA) 100 MG TABS tablet TAKE 1 TABLET (100 MG TOTAL) BY MOUTH DAILY. 03/23/16  Yes [name], MD  fluticasone (FLONASE) 50 MCG/ACT nasal spray Place 1 spray into both nostrils daily.   Yes Historical Provider, MD  lisinopril (PRINIVIL,ZESTRIL) 10 MG tablet TAKE 1 TABLET (10 MG TOTAL) BY MOUTH DAILY 03/23/16  Yes [name], MD  loratadine (CLARITIN) 10 MG tablet Take 10 mg by mouth daily.   Yes Historical Provider, MD  sitaGLIPtin-metformin (JANUMET) 50-1000 MG tablet TAKE 1 TABLET BY MOUTH 2 (TWO) TIMES DAILY WITH A MEAL. 03/23/16  Yes [name], MD  vitamin B-12 (CYANOCOBALAMIN) 1000 MCG tablet Take 1,000 mcg by mouth daily.   Yes Historical Provider, MD  vitamin C (ASCORBIC ACID) 500 MG tablet Take 500 mg by mouth daily.   Yes Historical Provider, MD  Social History   Social History  . Marital status: Single    Spouse name: N/A  . Number of children: N/A  . Years of education: N/A   Occupational History  . Not on file.   Social History Main Topics  . Smoking status: Never Smoker  . Smokeless tobacco: Never Used  . Alcohol use Yes     Comment: occ glass of wine.2-3 per month  . Drug use: No  . Sexual activity: Not on file   Other Topics Concern  . Not on file   Social History Narrative  . No narrative on file      Review of Systems  Constitutional: Negative for fever.  Respiratory: Negative for shortness of breath.   Cardiovascular: Negative for chest pain.  Musculoskeletal:  Positive for neck pain (rt sided, radiating into rt arm).  Neurological: Positive for numbness (rt arm).       Objective:   Physical Exam  Constitutional: He is oriented to person, place, and time. He appears well-developed and well-nourished. No distress.  HENT:  Head: Normocephalic and atraumatic.  Eyes: Conjunctivae are normal. Pupils are equal, round, and reactive to light.  Neck: Neck supple. No spinous process tenderness and no muscular tenderness present.  Pt's symptoms reproduced with neck extension. Pt has decreased neck extension. Pt has equal rotation of the neck bilat.   Cardiovascular: Normal rate.   Pulmonary/Chest: Effort normal.  Musculoskeletal: Normal range of motion.  Pt's shoulders have full and equal ROM with full rotator cuff strength.   Neurological: He is alert and oriented to person, place, and time.  Reflex Scores:      Tricep reflexes are 2+ on the right side and 2+ on the left side.      Bicep reflexes are 2+ on the right side and 2+ on the left side.      Brachioradialis reflexes are 2+ on the right side and 2+ on the left side. Skin: Skin is warm, dry and intact. No rash noted.  Psychiatric: He has a normal mood and affect. His behavior is normal.  Nursing note and vitals reviewed.    Vitals:   05/04/16 1602  BP: 122/74  Pulse: (!) 107  Resp: 18  Temp: 98 F (36.7 C)  TempSrc: Oral  SpO2: 97%  Weight: 184 lb (83.5 kg)  Height: 5\' 4"  (1.626 m)        Assessment & Plan:    Logan [name] is a 45 y.o. male Neck pain on right side - Plan: cyclobenzaprine (FLEXERIL) 5 MG tablet, meloxicam (MOBIC) 7.5 MG tablet, HYDROcodone-acetaminophen (NORCO/VICODIN) 5-325 MG tablet  Cervical radiculopathy - Plan: cyclobenzaprine (FLEXERIL) 5 MG tablet, meloxicam (MOBIC) 7.5 MG tablet, HYDROcodone-acetaminophen (NORCO/VICODIN) 5-325 MG tablet  Type 2 diabetes mellitus without complication, without long-term current use of insulin (HCC) Suspected right  sided cervical radiculopathy, with some interval improvement at home on Aleve.   - Will try meloxicam 7.5 mg daily, but advised him to contact his surgeon to make sure that is okay with a surgery coming up in 3 days.  - Flexeril at night, and if needed, short course of hydrocodone. Side effects discussed and additive sedation risks were discussed.   - Deferred prednisone given diabetes history and upcoming surgery.  - RTC precautions if persists into next week. Sooner if worse  Meds ordered this encounter  Medications  . cyclobenzaprine (FLEXERIL) 5 MG tablet    Sig: 1 pill by mouth up to every 8 hours as needed.  Start with one pill by mouth each bedtime as needed due to sedation    Dispense:  15 tablet    Refill:  0  . meloxicam (MOBIC) 7.5 MG tablet    Sig: Take 1 tablet (7.5 mg total) by mouth daily.    Dispense:  30 tablet    Refill:  0  . HYDROcodone-acetaminophen (NORCO/VICODIN) 5-325 MG tablet    Sig: Take 1 tablet by mouth every 6 (six) hours as needed for moderate pain.    Dispense:  15 tablet    Refill:  0   Patient Instructions   Meloxicam once per day, make sure your surgeon is okay with you taking this. Flexeril up to every 8 hours as needed, and if needed for breakthrough pain, I did write you for a few hydrocodone. Be careful combining this with other sedating medications including the muscle relaxant. If your symptoms have not improved into next week, return for recheck as x-ray would be needed at time or possible prednisone depending on your symptoms.  I am hesitant to start prednisone now with your upcoming surgery and diabetes.. Let me know if you have questions.    Cervical Radiculopathy Cervical radiculopathy happens when a nerve in the neck (cervical nerve) is pinched or bruised. This condition can develop because of an injury or as part of the normal aging process. Pressure on the cervical nerves can cause pain or numbness that runs from the neck all the way down  into the arm and fingers. Usually, this condition gets better with rest. Treatment may be needed if the condition does not improve.  CAUSES This condition may be caused by:  Injury.  Slipped (herniated) disk.  Muscle tightness in the neck because of overuse.  Arthritis.  Breakdown or degeneration in the bones and joints of the spine (spondylosis) due to aging.  Bone spurs that may develop near the cervical nerves. SYMPTOMS Symptoms of this condition include:  Pain that runs from the neck to the arm and hand. The pain can be severe or irritating. It may be worse when the neck is moved.  Numbness or weakness in the affected arm and hand. DIAGNOSIS This condition may be diagnosed based on symptoms, medical history, and a physical exam. You may also have tests, including:  X-rays.  CT scan.  MRI.  Electromyogram (EMG).  Nerve conduction tests. TREATMENT In many cases, treatment is not needed for this condition. With rest, the condition usually gets better over time. If treatment is needed, options may include:  Wearing a soft neck collar for short periods of time.  Physical therapy to strengthen your neck muscles.  Medicines, such as NSAIDs, oral corticosteroids, or spinal injections.  Surgery. This may be needed if other treatments do not help. Various types of surgery may be done depending on the cause of your problems. HOME CARE INSTRUCTIONS Managing Pain  Take over-the-counter and prescription medicines only as told by your health care provider.  If directed, apply ice to the affected area.  Put ice in a plastic bag.  Place a towel between your skin and the bag.  Leave the ice on for 20 minutes, 2-3 times per day.  If ice does not help, you can try using heat. Take a warm shower or warm bath, or use a heat pack as told by your health care provider.  Try a gentle neck and shoulder massage to help relieve symptoms. Activity  Rest as needed. Follow  instructions from your health care provider  about any restrictions on activities.  Do stretching and strengthening exercises as told by your health care provider or physical therapist. General Instructions  If you were given a soft collar, wear it as told by your health care provider.  Use a flat pillow when you sleep.  Keep all follow-up visits as told by your health care provider. This is important. SEEK MEDICAL CARE IF:  Your condition does not improve with treatment. SEEK IMMEDIATE MEDICAL CARE IF:  Your pain gets much worse and cannot be controlled with medicines.  You have weakness or numbness in your hand, arm, face, or leg.  You have a high fever.  You have a stiff, rigid neck.  You lose control of your bowels or your bladder (have incontinence).  You have trouble with walking, balance, or speaking.   This information is not intended to replace advice given to you by your health care provider. Make sure you discuss any questions you have with your health care provider.   Document Released: 04/21/2001 Document Revised: 04/17/2015 Document Reviewed: 09/20/2014 Elsevier Interactive Patient Education [employer].   IF you received an x-ray today, you will receive an invoice from [facility]. Please contact [facility] at [phone] with questions or concerns regarding your invoice.   IF you received labwork today, you will receive an invoice from [employer]. Please contact [facility] at [phone] with questions or concerns regarding your invoice.   Our billing staff will not be able to assist you with questions regarding bills from these companies.  You will be contacted with the lab results as soon as they are available. The fastest way to get your results is to activate your My Chart account. Instructions are located on the last page of this paperwork. If you have not heard from [place] regarding the results in 2 weeks,  please contact this office.        I personally performed the services described in this documentation, which was scribed in my presence. The recorded information has been reviewed and considered, and addended by me as needed.   Signed,   [name], MD [facility].  05/05/16 9:20 AM

## 2016-05-05 NOTE — Progress Notes (Signed)
Diabetes Self-Management Education  Visit Type:  Follow-up  Appt. Start Time: 1020 Appt. End Time: [id number]  05/05/2016  [name], identified by name and date of birth, is a 45 y.o. male with a diagnosis of Diabetes:   Type 2.  Patient is here for diabetes follow up today.  He was seen one month ago.  His weight is stable at 185 lbs.  He reports that his A1C decreased from 8.8% to between 7.1-7.6%.  He has been checking his blood sugar more and "seeing what foods are doing to it".  He has been including a protein with each meal and snack and has been decreasing portions of starch.  He states the only time he had a low blood sugar was when he skipped a meal due to traveling.  His mother has been sick recently (had an ostomy reversed) and he has decreased his exercise but now plans on increasing this again.  He states that his blood sugar is now controlled enough to have ventral hernia surgery.  ASSESSMENT  There were no vitals taken for this visit. There is no height or weight on file to calculate BMI.       Diabetes Self-Management Education - [id number]      Complications   Last HgB A1C per patient/outside source 7.6 %  7.1%-7.6%   Fasting Blood glucose range (mg/dL) 70-129   Postprandial Blood glucose range (mg/dL) 130-179   Number of hypoglycemic episodes per month 1  skipped a meal due to traveling      Learning Objective:  Patient will have a greater understanding of diabetes self-management. Patient education plan is to attend individual and/or group sessions per assessed needs and concerns.   Plan:   Patient Instructions  Doristine Devoid job on the changes that you have made.  Get back into the exercise. Limit saturated fat (solid fat, chicken skin) and added oils.   Expected Outcomes:   Demonstrated interest and expect positive outcomes.  Education material provided:   If problems or questions, patient to contact team via:  Phone and Email  Future DSME  appointment: -

## 2016-05-06 NOTE — Anesthesia Preprocedure Evaluation (Addendum)
Anesthesia Evaluation  Patient identified by MRN, date of birth, ID band Patient awake    Reviewed: Allergy & Precautions, NPO status , Patient's Chart, lab work & pertinent test results  History of Anesthesia Complications Negative for: history of anesthetic complications  Airway Mallampati: III  TM Distance: >3 FB Neck ROM: Full    Dental  (+) Teeth Intact, Dental Advisory Given   Pulmonary neg pulmonary ROS,    Pulmonary exam normal breath sounds clear to auscultation       Cardiovascular Exercise Tolerance: Good hypertension, Pt. on medications (-) angina(-) Past MI, (-) Cardiac Stents, (-) CABG and (-) Orthopnea  Rhythm:Regular Rate:Normal  EKG 03/23/2016: NSR   Neuro/Psych neg Seizures Cervical radiculopathy    GI/Hepatic negative GI ROS, Neg liver ROS,   Endo/Other  diabetes (Hgb A1c 8.8), Type 2, Oral Hypoglycemic Agents  Renal/GU negative Renal ROS     Musculoskeletal   Abdominal (+) + obese,   Peds  Hematology negative hematology ROS (+)   Anesthesia Other Findings Umbilical hernia  Reproductive/Obstetrics                            Anesthesia Physical Anesthesia Plan  ASA: III  Anesthesia Plan: General   Post-op Pain Management:    Induction: Intravenous  Airway Management Planned: Oral ETT  Additional Equipment:   Intra-op Plan:   Post-operative Plan: Extubation in OR  Informed Consent: I have reviewed the patients History and Physical, chart, labs and discussed the procedure including the risks, benefits and alternatives for the proposed anesthesia with the patient or authorized representative who has indicated his/her understanding and acceptance.   Dental advisory given  Plan Discussed with: CRNA  Anesthesia Plan Comments: (Risks of general anesthesia discussed including, but not limited to, sore throat, hoarse voice, chipped/damaged teeth, injury to vocal  cords, nausea and vomiting, allergic reactions, lung infection, heart attack, stroke, and death. All questions answered. )       Anesthesia Quick Evaluation

## 2016-05-07 ENCOUNTER — Ambulatory Visit (HOSPITAL_COMMUNITY)
Admission: RE | Admit: 2016-05-07 | Discharge: 2016-05-07 | Disposition: A | Discharge status: Home / Self Care | Payer: BLUE CROSS/BLUE SHIELD | Source: Ambulatory Visit | Attending: Surgery | Admitting: Surgery

## 2016-05-07 ENCOUNTER — Encounter (HOSPITAL_COMMUNITY)
Admission: RE | Disposition: A | Discharge status: Home / Self Care | Payer: Self-pay | Source: Ambulatory Visit | Attending: Surgery

## 2016-05-07 ENCOUNTER — Ambulatory Visit (HOSPITAL_COMMUNITY): Payer: BLUE CROSS/BLUE SHIELD | Admitting: Vascular Surgery

## 2016-05-07 ENCOUNTER — Encounter (HOSPITAL_COMMUNITY): Payer: Self-pay | Admitting: *Deleted

## 2016-05-07 ENCOUNTER — Ambulatory Visit (HOSPITAL_COMMUNITY): Payer: BLUE CROSS/BLUE SHIELD | Admitting: Anesthesiology

## 2016-05-07 DIAGNOSIS — Z833 Family history of diabetes mellitus: Secondary | ICD-10-CM | POA: Diagnosis not present

## 2016-05-07 DIAGNOSIS — Z6831 Body mass index (BMI) 31.0-31.9, adult: Secondary | ICD-10-CM | POA: Insufficient documentation

## 2016-05-07 DIAGNOSIS — I10 Essential (primary) hypertension: Secondary | ICD-10-CM | POA: Diagnosis not present

## 2016-05-07 DIAGNOSIS — Z79899 Other long term (current) drug therapy: Secondary | ICD-10-CM | POA: Diagnosis not present

## 2016-05-07 DIAGNOSIS — Z7982 Long term (current) use of aspirin: Secondary | ICD-10-CM | POA: Insufficient documentation

## 2016-05-07 DIAGNOSIS — E118 Type 2 diabetes mellitus with unspecified complications: Secondary | ICD-10-CM | POA: Insufficient documentation

## 2016-05-07 DIAGNOSIS — N62 Hypertrophy of breast: Secondary | ICD-10-CM | POA: Insufficient documentation

## 2016-05-07 DIAGNOSIS — E669 Obesity, unspecified: Secondary | ICD-10-CM | POA: Diagnosis not present

## 2016-05-07 DIAGNOSIS — Z7984 Long term (current) use of oral hypoglycemic drugs: Secondary | ICD-10-CM | POA: Diagnosis not present

## 2016-05-07 DIAGNOSIS — K420 Umbilical hernia with obstruction, without gangrene: Secondary | ICD-10-CM | POA: Diagnosis not present

## 2016-05-07 DIAGNOSIS — K439 Ventral hernia without obstruction or gangrene: Secondary | ICD-10-CM | POA: Diagnosis present

## 2016-05-07 HISTORY — PX: VENTRAL HERNIA REPAIR: SHX424

## 2016-05-07 HISTORY — PX: INSERTION OF MESH: SHX5868

## 2016-05-07 LAB — GLUCOSE, CAPILLARY
Glucose-Capillary: 160 mg/dL — ABNORMAL HIGH (ref 65–99)
Glucose-Capillary: 143 mg/dL — ABNORMAL HIGH (ref 65–99)

## 2016-05-07 SURGERY — REPAIR, HERNIA, VENTRAL
Anesthesia: General | Site: Abdomen

## 2016-05-07 MED ORDER — PROPOFOL 10 MG/ML IV BOLUS
INTRAVENOUS | Status: DC | PRN
  Administered 2016-05-07: 20 mg via INTRAVENOUS
  Administered 2016-05-07: 180 mg via INTRAVENOUS

## 2016-05-07 MED ORDER — FENTANYL CITRATE (PF) 100 MCG/2ML IJ SOLN
INTRAMUSCULAR | Status: DC | PRN
  Administered 2016-05-07 (×2): 100 ug via INTRAVENOUS

## 2016-05-07 MED ORDER — ONDANSETRON HCL 4 MG/2ML IJ SOLN
INTRAMUSCULAR | Status: DC | PRN
  Administered 2016-05-07: 4 mg via INTRAVENOUS

## 2016-05-07 MED ORDER — CHLORHEXIDINE GLUCONATE CLOTH 2 % EX PADS: 6.0000 | MEDICATED_PAD | Freq: Once | CUTANEOUS | Status: DC

## 2016-05-07 MED ORDER — MIDAZOLAM HCL 5 MG/5ML IJ SOLN
INTRAMUSCULAR | Status: DC | PRN
  Administered 2016-05-07: 2 mg via INTRAVENOUS

## 2016-05-07 MED ORDER — OXYCODONE-ACETAMINOPHEN 5-325 MG PO TABS: 1.0000 | ORAL_TABLET | ORAL | 0 refills | Status: DC | PRN

## 2016-05-07 MED ORDER — PROPOFOL 10 MG/ML IV BOLUS
INTRAVENOUS | Status: AC
  Filled 2016-05-07: qty 20

## 2016-05-07 MED ORDER — OXYCODONE-ACETAMINOPHEN 5-325 MG PO TABS: 1.0000 | ORAL_TABLET | ORAL | Status: DC | PRN

## 2016-05-07 MED ORDER — PROMETHAZINE HCL 25 MG/ML IJ SOLN: 6.2500 mg | INTRAMUSCULAR | Status: DC | PRN

## 2016-05-07 MED ORDER — SUGAMMADEX SODIUM 200 MG/2ML IV SOLN
INTRAVENOUS | Status: DC | PRN
  Administered 2016-05-07: 180 mg via INTRAVENOUS

## 2016-05-07 MED ORDER — FENTANYL CITRATE (PF) 100 MCG/2ML IJ SOLN
INTRAMUSCULAR | Status: AC
  Filled 2016-05-07: qty 2

## 2016-05-07 MED ORDER — CELECOXIB 200 MG PO CAPS
400.0000 mg | ORAL_CAPSULE | ORAL | Status: AC
  Administered 2016-05-07: 400 mg via ORAL
  Filled 2016-05-07: qty 2

## 2016-05-07 MED ORDER — LIDOCAINE HCL (CARDIAC) 20 MG/ML IV SOLN
INTRAVENOUS | Status: DC | PRN
  Administered 2016-05-07: 100 mg via INTRAVENOUS

## 2016-05-07 MED ORDER — FENTANYL CITRATE (PF) 100 MCG/2ML IJ SOLN
25.0000 ug | INTRAMUSCULAR | Status: DC | PRN
  Administered 2016-05-07: 50 ug via INTRAVENOUS

## 2016-05-07 MED ORDER — LIDOCAINE 2% (20 MG/ML) 5 ML SYRINGE
INTRAMUSCULAR | Status: AC
  Filled 2016-05-07: qty 5

## 2016-05-07 MED ORDER — LACTATED RINGERS IV SOLN
INTRAVENOUS | Status: DC | PRN
  Administered 2016-05-07 (×2): via INTRAVENOUS

## 2016-05-07 MED ORDER — PHENYLEPHRINE HCL 10 MG/ML IJ SOLN
INTRAMUSCULAR | Status: DC | PRN
  Administered 2016-05-07 (×3): 80 ug via INTRAVENOUS
  Administered 2016-05-07: 40 ug via INTRAVENOUS

## 2016-05-07 MED ORDER — ROCURONIUM BROMIDE 100 MG/10ML IV SOLN
INTRAVENOUS | Status: DC | PRN
  Administered 2016-05-07: 20 mg via INTRAVENOUS
  Administered 2016-05-07: 40 mg via INTRAVENOUS

## 2016-05-07 MED ORDER — CLINDAMYCIN PHOSPHATE 600 MG/50ML IV SOLN
600.0000 mg | Freq: Once | INTRAVENOUS | Status: AC
  Administered 2016-05-07: 600 mg via INTRAVENOUS
  Filled 2016-05-07: qty 50

## 2016-05-07 MED ORDER — GABAPENTIN 300 MG PO CAPS
300.0000 mg | ORAL_CAPSULE | ORAL | Status: AC
  Administered 2016-05-07: 300 mg via ORAL
  Filled 2016-05-07: qty 1

## 2016-05-07 MED ORDER — BUPIVACAINE-EPINEPHRINE (PF) 0.25% -1:200000 IJ SOLN
INTRAMUSCULAR | Status: AC
  Filled 2016-05-07: qty 30

## 2016-05-07 MED ORDER — ONDANSETRON HCL 4 MG/2ML IJ SOLN
INTRAMUSCULAR | Status: AC
  Filled 2016-05-07: qty 2

## 2016-05-07 MED ORDER — ACETAMINOPHEN 500 MG PO TABS
1000.0000 mg | ORAL_TABLET | ORAL | Status: AC
  Administered 2016-05-07: 1000 mg via ORAL
  Filled 2016-05-07: qty 2

## 2016-05-07 MED ORDER — BUPIVACAINE-EPINEPHRINE 0.25% -1:200000 IJ SOLN
INTRAMUSCULAR | Status: DC | PRN
  Administered 2016-05-07: 16 mL

## 2016-05-07 MED ORDER — MIDAZOLAM HCL 2 MG/2ML IJ SOLN
INTRAMUSCULAR | Status: AC
  Filled 2016-05-07: qty 2

## 2016-05-07 MED ORDER — ROCURONIUM BROMIDE 10 MG/ML (PF) SYRINGE
PREFILLED_SYRINGE | INTRAVENOUS | Status: AC
  Filled 2016-05-07: qty 10

## 2016-05-07 MED ORDER — 0.9 % SODIUM CHLORIDE (POUR BTL) OPTIME
TOPICAL | Status: DC | PRN
  Administered 2016-05-07: 1000 mL

## 2016-05-07 SURGICAL SUPPLY — 37 items
BLADE SURG ROTATE 9660 (MISCELLANEOUS) IMPLANT
CANISTER SUCTION 2500CC (MISCELLANEOUS) ×2 IMPLANT
CHLORAPREP W/TINT 26ML (MISCELLANEOUS) ×2 IMPLANT
COVER SURGICAL LIGHT HANDLE (MISCELLANEOUS) ×2 IMPLANT
DRAPE LAPAROSCOPIC ABDOMINAL (DRAPES) ×2 IMPLANT
DRAPE UTILITY XL STRL (DRAPES) IMPLANT
ELECT CAUTERY BLADE 6.4 (BLADE) ×2 IMPLANT
ELECT REM PT RETURN 9FT ADLT (ELECTROSURGICAL) ×2
ELECTRODE REM PT RTRN 9FT ADLT (ELECTROSURGICAL) ×1 IMPLANT
GLOVE BIO SURGEON STRL SZ8 (GLOVE) ×2 IMPLANT
GLOVE BIOGEL PI IND STRL 8 (GLOVE) ×1 IMPLANT
GLOVE BIOGEL PI INDICATOR 8 (GLOVE) ×1
GOWN STRL REUS W/ TWL LRG LVL3 (GOWN DISPOSABLE) ×2 IMPLANT
GOWN STRL REUS W/ TWL XL LVL3 (GOWN DISPOSABLE) ×1 IMPLANT
GOWN STRL REUS W/TWL LRG LVL3 (GOWN DISPOSABLE) ×2
GOWN STRL REUS W/TWL XL LVL3 (GOWN DISPOSABLE) ×1
KIT BASIN OR (CUSTOM PROCEDURE TRAY) ×2 IMPLANT
KIT ROOM TURNOVER OR (KITS) ×2 IMPLANT
LIQUID BAND (GAUZE/BANDAGES/DRESSINGS) ×2 IMPLANT
MESH VENTRALEX ST 1-7/10 CRC S (Mesh General) ×2 IMPLANT
NEEDLE HYPO 25GX1X1/2 BEV (NEEDLE) ×2 IMPLANT
NS IRRIG 1000ML POUR BTL (IV SOLUTION) ×2 IMPLANT
PACK GENERAL/GYN (CUSTOM PROCEDURE TRAY) ×2 IMPLANT
PAD ARMBOARD 7.5X6 YLW CONV (MISCELLANEOUS) ×2 IMPLANT
STAPLER VISISTAT 35W (STAPLE) IMPLANT
SUT MNCRL AB 4-0 PS2 18 (SUTURE) ×2 IMPLANT
SUT NOVA 1 T20/GS 25DT (SUTURE) ×2 IMPLANT
SUT PDS AB 1 TP1 96 (SUTURE) IMPLANT
SUT PROLENE 1 CT (SUTURE) IMPLANT
SUT VIC AB 3-0 54X BRD REEL (SUTURE) IMPLANT
SUT VIC AB 3-0 BRD 54 (SUTURE)
SUT VIC AB 3-0 SH 27 (SUTURE) ×1
SUT VIC AB 3-0 SH 27XBRD (SUTURE) ×1 IMPLANT
SYR CONTROL 10ML LL (SYRINGE) ×2 IMPLANT
TOWEL OR 17X24 6PK STRL BLUE (TOWEL DISPOSABLE) ×2 IMPLANT
TOWEL OR 17X26 10 PK STRL BLUE (TOWEL DISPOSABLE) IMPLANT
TRAY FOLEY CATH 14FRSI W/METER (CATHETERS) IMPLANT

## 2016-05-07 NOTE — Anesthesia Procedure Notes (Signed)
Procedure Name: Intubation Date/Time: 05/07/2016 7:32 AM Performed by: Manuela Schwartz B Pre-anesthesia Checklist: Patient identified, Emergency Drugs available, Suction available, Patient being monitored and Timeout performed Patient Re-evaluated:Patient Re-evaluated prior to inductionOxygen Delivery Method: Circle system utilized Preoxygenation: Pre-oxygenation with 100% oxygen Intubation Type: IV induction Ventilation: Mask ventilation without difficulty Laryngoscope Size: Mac and 3 Grade View: Grade I Tube type: Oral Tube size: 7.5 mm Number of attempts: 1 Airway Equipment and Method: Stylet Placement Confirmation: ETT inserted through vocal cords under direct vision,  breath sounds checked- equal and bilateral and positive ETCO2 Secured at: 22 cm Tube secured with: Tape Dental Injury: Teeth and Oropharynx as per pre-operative assessment

## 2016-05-07 NOTE — Op Note (Signed)
STEWARD COST 06-21-1971 [id number] 05/07/2016  Preoperative diagnosis: ventral hernia     Postoperative diagnosis:incarcerated umbilical hernia   Procedure:Ventral  Hernia Repair with mesh   Surgeon: Erroll Luna , MD, FACS  Anesthesia: General and local    Clinical History and Indications: Pt presents with symptomatic ventral hernia causing pain.The risk of hernia repair include bleeding,  Infection,   Recurrence of the hernia,  Mesh use, chronic pain,  Organ injury,  Bowel injury,  Bladder injury,   nerve injury with numbness around the incision,  Death,  and worsening of preexisting  medical problems.  The alternatives to surgery have been discussed as well..  Long term expectations of both operative and non operative treatments have been discussed.   The patient agrees to proceed.  Procedure: The patient was seen in the preoperative area and the plans for the procedure reviewed again. He  had no further questions. I marked the area of the umbilicus as the operative site. He wishes to prodeed.  The patient was taken to the operating room and after satisfactory general  anesthesia had been obtained the area was clipped as needed, prepped and draped. The timeout was performed.  I used some 0.25 % sensoricaine with epinephrine anesthesia to help with postoperative pain management. This was infiltrated around the umbilical area and additional infiltrated as I worked.  A curvilinear incision was made on the inferior aspect of the umbilicus. The umbilical skin was elevated off of the hernia sac. The hernia sac was dissected free of the subcutaneous tissues.  There was incarcerated preperitoneal fat in the hernia.  This  was dissected free and reduced.   A 4.3 cm  Circular coated ventralight mesh used and sutured to the undersurface of the fascia with 2 O novafil.  The fascia defect was 1.5 cm in diameter.  The defect was closed with 2 O novofil.    Once the repair was complete the incision  was closed by using some 3-0 Vicryl subcutaneous and 4-0 Monocryl subcuticular sutures. Liquid adhesive applied.   The patient tolerated the procedure well. There were no operative complications. There was minimal blood loss. All counts were correct. He was taken to the PACU in satisfactory condition.  Erroll Luna, MD, FACS 05/07/2016 8:33 AM

## 2016-05-07 NOTE — Discharge Instructions (Signed)
[facility] _______[facility]  UMBILICAL OR INGUINAL HERNIA REPAIR: POST OP INSTRUCTIONS  Always review your discharge instruction sheet given to you by the facility where your surgery was performed. IF YOU HAVE DISABILITY OR FAMILY LEAVE FORMS, YOU MUST BRING THEM TO THE OFFICE FOR PROCESSING.   DO NOT GIVE THEM TO YOUR DOCTOR.  1. A  prescription for pain medication may be given to you upon discharge.  Take your pain medication as prescribed, if needed.  If narcotic pain medicine is not needed, then you may take acetaminophen (Tylenol) or ibuprofen (Advil) as needed. 2. Take your usually prescribed medications unless otherwise directed. 3. If you need a refill on your pain medication, please contact your pharmacy.  They will contact our office to request authorization. Prescriptions will not be filled after 5 pm or on week-ends. 4. You should follow a light diet the first 24 hours after arrival home, such as soup and crackers, etc.  Be sure to include lots of fluids daily.  Resume your normal diet the day after surgery. 5. Most patients will experience some swelling and bruising around the umbilicus or in the groin and scrotum.  Ice packs and reclining will help.  Swelling and bruising can take several days to resolve.  6. It is common to experience some constipation if taking pain medication after surgery.  Increasing fluid intake and taking a stool softener (such as Colace) will usually help or prevent this problem from occurring.  A mild laxative (Milk of Magnesia or Miralax) should be taken according to package directions if there are no bowel movements after 48 hours. 7. Unless discharge instructions indicate otherwise, you may remove your bandages 24-48 hours after surgery, and you may shower at that time.  You may have steri-strips (small skin tapes) in place directly over the incision.  These strips should be left on the skin for 7-10 days.  If your surgeon used skin glue on the  incision, you may shower in 24 hours.  The glue will flake off over the next 2-3 weeks.  Any sutures or staples will be removed at the office during your follow-up visit. 8. ACTIVITIES:  You may resume regular (light) daily activities beginning the next day--such as daily self-care, walking, climbing stairs--gradually increasing activities as tolerated.  You may have sexual intercourse when it is comfortable.  Refrain from any heavy lifting or straining until approved by your doctor. a. You may drive when you are no longer taking prescription pain medication, you can comfortably wear a seatbelt, and you can safely maneuver your car and apply brakes. b. RETURN TO WORK:  __________________________________________________________ 9. You should see your doctor in the office for a follow-up appointment approximately 2-3 weeks after your surgery.  Make sure that you call for this appointment within a day or two after you arrive home to insure a convenient appointment time. 10. OTHER INSTRUCTIONS:  __________________________________________________________________________________________________________________________________________________________________________________________  WHEN TO CALL YOUR DOCTOR: 1. Fever over 101.0 2. Inability to urinate 3. Nausea and/or vomiting 4. Extreme swelling or bruising 5. Continued bleeding from incision. 6. Increased pain, redness, or drainage from the incision  The clinic staff is available to answer your questions during regular business hours.  Please don't hesitate to call and ask to speak to one of the nurses for clinical concerns.  If you have a medical emergency, go to the nearest emergency room or call 911.  A surgeon from [facility] is always on call at the hospital     [address], [address], [place], [place]  [address] ?  [address] [address], [place],    [address] [phone] ? [phone] ? FAX [phone] Web site:  [email or web address]  

## 2016-05-07 NOTE — Interval H&P Note (Signed)
History and Physical Interval Note:  05/07/2016 7:19 AM  Logan [name]  has presented today for surgery, with the diagnosis of ventral hernia  The various methods of treatment have been discussed with the patient and family. After consideration of risks, benefits and other options for treatment, the patient has consented to  Procedure(s): REPAIR VENTRAL HERNIA (N/A) INSERTION OF MESH (N/A) as a surgical intervention .  The patient's history has been reviewed, patient examined, no change in status, stable for surgery.  I have reviewed the patient's chart and labs.  Questions were answered to the patient's satisfaction.     Jearldine Cassady A.

## 2016-05-07 NOTE — Transfer of Care (Signed)
Immediate Anesthesia Transfer of Care Note  Patient: Logan [name]  Procedure(s) Performed: Procedure(s): REPAIR VENTRAL HERNIA (N/A) INSERTION OF MESH (N/A)  Patient Location: PACU  Anesthesia Type:General  Level of Consciousness: awake, alert  and oriented  Airway & Oxygen Therapy: Patient Spontanous Breathing and Patient connected to nasal cannula oxygen  Post-op Assessment: Report given to RN and Post -op Vital signs reviewed and stable  Post vital signs: Reviewed and stable  Last Vitals:  Vitals:   05/07/16 0602  BP: 124/75  Pulse: 93  Resp: 18  Temp: 37 C    Last Pain:  Vitals:   05/07/16 0602  TempSrc: Oral      Patients Stated Pain Goal: 6 ([id number])  Complications: No apparent anesthesia complications

## 2016-05-07 NOTE — Anesthesia Postprocedure Evaluation (Signed)
Anesthesia Post Note  Patient: Logan [name]  Procedure(s) Performed: Procedure(s) (LRB): REPAIR VENTRAL HERNIA (N/A) INSERTION OF MESH (N/A)  Patient location during evaluation: PACU Anesthesia Type: General Level of consciousness: awake and alert Pain management: pain level controlled Vital Signs Assessment: post-procedure vital signs reviewed and stable Respiratory status: spontaneous breathing, nonlabored ventilation and respiratory function stable Cardiovascular status: blood pressure returned to baseline and stable Postop Assessment: no signs of nausea or vomiting Anesthetic complications: no    Last Vitals:  Vitals:   05/07/16 0841 05/07/16 0854  BP: 118/67 101/72  Pulse:  92  Resp:  16  Temp: 36.7 C     Last Pain:  Vitals:   05/07/16 0602  TempSrc: Oral                 Nilda Simmer

## 2016-05-07 NOTE — H&P (Signed)
Pre-op/Pre-procedure Orders Open    03/17/2016 [facility]  Logan [name], Logan [name]  General Surgery   Additional Documentation   Encounter Info:   Billing Info,   History,   Allergies,   Detailed Report     All Notes      Author: Erroll [name], Logan [name] Author Type: Physician Filed: 8  Note Status: Signed Cosign: Cosign Not Required Encounter Date: 8  Editor: Logan [name], Logan [name] (Physician)    Logan [name]  Location: [facility] Patient #: [id number] DOB: 12/16/1970 Single / Language: Logan [name] / Race: White Male   History of Present Illness Logan [name]; [id number] 3:13 PM) Patient words: hernia    Patient returns for follow-up of bilateral gynecomastia and umbilical hernia. He was scheduled for umbilical hernia repair last year but canceled. He has strong desire for mastectomy for bilateral hypermastia.  now wishes to schedule his umbilical hernia        And bilateral mastectomy Mesh in wishes to undergo bilateral nipple sparing mastectomy for his gynecomastia. There is no causes gynecomastia except his diabetes and this is under good control the last year.  The patient is a 45 year old male.   Problem List/Past Medical ([address] Logan [name], Logan [name] GYNECOMASTIA (N62) DIASTASIS RECTI (M62.08) FAMILY HISTORY OF GYNECOMASTIA [phone])  Other Problems Logan [name], Logan [name];  1:47 PM) VENTRAL HERNIA WITHOUT OBSTRUCTION OR GANGRENE (K43.9)12/17/2014 Diabetes Mellitus High blood pressure Hypercholesterolemia Umbilical Hernia Repair  Past Surgical History Logan [name], Logan [name];  1:47 PM) No pertinent past surgical history  Diagnostic Studies History Logan [name], [name]; 11/04/2015 1:47 PM) Colonoscopy never  Allergies Logan [name], Logan [name];  Penicillamine *ASSORTED CLASSES*  Medication History Logan [name], Logan [name];  1:48 PM) Vitamin Logan [name] ER (500MG  Tablet ER, Oral) Active. Janumet (50-1000MG  Tablet, Oral)  Active. Claritin (10MG  Capsule, Oral) Active. Lisinopril (10MG  Tablet, Oral) Active. Invokana (100MG  Tablet, Oral) Active. Vitamin B Complex (Oral) Active. Vitamin B 12 (100MCG Lozenge, Oral) Active. Vitamin Logan [name] (500MG  Capsule, Oral) Active. Aspirin (81MG  Tablet, Oral) Active. Medications Reconciled  Social History Logan [name], [place]; Alcohol use Occasional alcohol use. Caffeine use Carbonated beverages, Coffee, Tea. No drug use Tobacco use Never smoker.  Family History Logan [name], Logan [name]; 1:48 PM) Alcohol Abuse Father. Arthritis Mother, Sister. Diabetes Mellitus Brother, Father. Hypertension Mother. Ischemic Bowel Disease Mother. Migraine Headache Sister.  Vitals [name] Logan. [name] Logan [name]; 11/04/2015 1:47 PM) 11/04/2015 1:46 PM Weight: 185.25 lb Height: 64in Body Surface Area: 1.89 m Body Mass Index: 31.8 kg/m  BP: 114/78 (Sitting, Left Arm, Standard)       Physical Exam (Laurell Coalson A. Korie Streat M 3:14 PM) General Mental Status-Alert. General Appearance-Consistent with stated age. Hydration-Well hydrated. Voice-Normal.  Breast Note: Bilateral breasts with significant hyperplasia noted. No masses.   Abdomen Note: Reducible umbilical hernia. Small. Not tender. Rectus diastases noted.   Neurologic Neurologic evaluation reveals -alert and oriented x 3 with no impairment of recent or remote memory. Mental Status-Normal.  Musculoskeletal Normal Exam - Left-Upper Extremity Strength Normal and Lower Extremity Strength Normal. Normal Exam - Right-Upper Extremity Strength Normal and Lower Extremity Strength Normal.    Assessment & Plan (Devontaye Ground A. Hildred Mollica Logan [name];  3:14 PM) GYNECOMASTIA (N62) No surgery at this point due to poor control of diabetes  DIASTASIS RECTI (M62.08) Impression: no treatment needed Ventral hernia  Repair today with mesh  . Current Plans Pt Education - CCS Mastectomy HCI Pt  Education - Pamphlet Given - Hernia Surgery: discussed with patient and provided information.  The anatomy & physiology of the abdominal wall was discussed. The pathophysiology of hernias was discussed. Natural history risks without surgery including progeressive enlargement, pain, incarceration, & strangulation was discussed. Contributors to complications such as smoking, obesity, diabetes, prior surgery, etc were discussed.  I feel the risks of no intervention will lead to serious problems that outweigh the operative risks; therefore, I recommended surgery to reduce and repair the hernia. I explained laparoscopic techniques with possible need for an open approach. I noted the probable use of mesh to patch and/or buttress the hernia repair  Risks such as bleeding, infection, abscess, need for further treatment, heart attack, death, and other risks were discussed. I noted a good likelihood this will help address the problem. Goals of post-operative recovery were discussed as well. Possibility that this will not correct all symptoms was explained. I stressed the importance of low-impact activity, aggressive pain control, avoiding constipation, & not pushing through pain to minimize risk of post-operative chronic pain or injury. Possibility of reherniation especially with smoking, obesity, diabetes, immunosuppression, and other health conditions was discussed. We will work to minimize complications.  An educational handout further explaining the pathology & treatment options was given as well. Questions were answered. The patient expresses understanding & wishes to proceed with surgery.  Pt Education - Consent for inguinal hernia - [place]: discussed with patient and provided information. Pt Education - CCS Hernia Post-Op HCI ([place]): discussed with patient and provided information. UMBILICAL HERNIA ([id number]) Impression: The risk of hernia repair include bleeding, infection, organ injury, bowel injury,  bladder injury, nerve injury recurrent hernia, blood clots, worsening of underlying condition, chronic pain, mesh use, open surgery, death, and the need for other operattions. Pt agrees to proceed    Signed by Turner Daniels, Logan [name]

## 2016-05-08 ENCOUNTER — Encounter (HOSPITAL_COMMUNITY): Payer: Self-pay | Admitting: Surgery

## 2016-06-23 ENCOUNTER — Ambulatory Visit (INDEPENDENT_AMBULATORY_CARE_PROVIDER_SITE_OTHER): Payer: BLUE CROSS/BLUE SHIELD | Admitting: Family Medicine

## 2016-06-23 ENCOUNTER — Encounter: Payer: Self-pay | Admitting: Family Medicine

## 2016-06-23 VITALS — BP 118/76 | HR 101 | Temp 98.3°F | Resp 17 | Ht 64.0 in | Wt 185.0 lb

## 2016-06-23 DIAGNOSIS — E119 Type 2 diabetes mellitus without complications: Secondary | ICD-10-CM | POA: Diagnosis not present

## 2016-06-23 DIAGNOSIS — I10 Essential (primary) hypertension: Secondary | ICD-10-CM | POA: Diagnosis not present

## 2016-06-23 DIAGNOSIS — Z7251 High risk heterosexual behavior: Secondary | ICD-10-CM

## 2016-06-23 DIAGNOSIS — Z0000 Encounter for general adult medical examination without abnormal findings: Secondary | ICD-10-CM | POA: Diagnosis not present

## 2016-06-23 DIAGNOSIS — Z23 Encounter for immunization: Secondary | ICD-10-CM

## 2016-06-23 DIAGNOSIS — Z113 Encounter for screening for infections with a predominantly sexual mode of transmission: Secondary | ICD-10-CM | POA: Diagnosis not present

## 2016-06-23 DIAGNOSIS — E785 Hyperlipidemia, unspecified: Secondary | ICD-10-CM | POA: Diagnosis not present

## 2016-06-23 DIAGNOSIS — R21 Rash and other nonspecific skin eruption: Secondary | ICD-10-CM

## 2016-06-23 LAB — COMPLETE METABOLIC PANEL WITH GFR
ALT: 53 U/L — ABNORMAL HIGH (ref 9–46)
AST: 27 U/L (ref 10–40)
Albumin: 4.5 g/dL (ref 3.6–5.1)
Alkaline Phosphatase: 92 U/L (ref 40–115)
BUN: 17 mg/dL (ref 7–25)
CO2: 25 mmol/L (ref 20–31)
Calcium: 9.3 mg/dL (ref 8.6–10.3)
Chloride: 104 mmol/L (ref 98–110)
Creat: 0.86 mg/dL (ref 0.60–1.35)
GFR, Est African American: >89 mL/min (ref >=60)
GFR, Est Non African American: >89 mL/min (ref >=60)
Glucose, Bld: 151 mg/dL — ABNORMAL HIGH (ref 65–99)
Potassium: 4.4 mmol/L (ref 3.5–5.3)
Sodium: 137 mmol/L (ref 135–146)
Total Bilirubin: 0.4 mg/dL (ref 0.2–1.2)
Total Protein: 7.4 g/dL (ref 6.1–8.1)

## 2016-06-23 LAB — HIV ANTIBODY (ROUTINE TESTING W REFLEX): HIV 1&2 Ab, 4th Generation: NONREACTIVE

## 2016-06-23 LAB — LIPID PANEL
Cholesterol: 210 mg/dL — ABNORMAL HIGH (ref <200)
HDL: 40 mg/dL — ABNORMAL LOW (ref >40)
LDL Cholesterol: 146 mg/dL — ABNORMAL HIGH (ref <100)
Total CHOL/HDL Ratio: 5.3 Ratio — ABNORMAL HIGH (ref <5.0)
Triglycerides: 121 mg/dL (ref <150)
VLDL: 24 mg/dL (ref <30)

## 2016-06-23 LAB — HEMOGLOBIN A1C
Hgb A1c MFr Bld: 7.1 % — ABNORMAL HIGH (ref <5.7)
Mean Plasma Glucose: 157 mg/dL

## 2016-06-23 LAB — HM DIABETES EYE EXAM

## 2016-06-23 MED ORDER — SITAGLIPTIN PHOS-METFORMIN HCL 50-1000 MG PO TABS: ORAL_TABLET | ORAL | 1 refills | Status: DC

## 2016-06-23 MED ORDER — CANAGLIFLOZIN 100 MG PO TABS: ORAL_TABLET | ORAL | 1 refills | Status: DC

## 2016-06-23 NOTE — Progress Notes (Signed)
By signing my name below, I, Mesha Guinyard, attest that this documentation has been prepared under the direction and in the presence of [name], MD.  Electronically Signed: Verlee Monte, Medical Scribe. 06/23/16. 1:59 PM.  Subjective:    Patient ID: Logan [name], male    DOB: 03-15-1971, 45 y.o.   MRN: [id number]  HPI Chief Complaint  Patient presents with  . Annual Exam    For work    HPI Comments: Logan [name] is a 45 y.o. male with a PMHx of HTN, and DM who presents to the [facility] for a complete physical. Pt was last visit with me in September. Pt would like to have his paper work faxed to his job.  DM: We last talked about DM 03/23/16 and he last saw a nutritionist 05/03/16. Takes janumet 50/100 BID, invokana 100mg  QD, and ASA 81 mg regularly. Fasting blood sugar readings are in the high 90s to 125, and after he eat it's below 200 typically in the 150 range. Reports fasting hypoglycemic levels of 70-75 three months ago. Pt has changed his diet after meeting up with the nurtitionist. Lab Results  Component Value Date   HGBA1C 8.8 (H) 03/23/2016   Lab Results  Component Value Date   MICROALBUR 1.7 07/17/2015   Wt Readings from Last 3 Encounters:  06/23/16 185 lb (83.9 kg)  05/07/16 185 lb (83.9 kg)  05/04/16 184 lb (83.5 kg)   HTN: Compliant with lisinopril. Lab Results  Component Value Date   CREATININE 0.68 04/28/2016   BP Readings from Last 3 Encounters:  06/23/16 118/76  05/07/16 120/88  05/04/16 [id number]   Hernia: Umbilical hernia repair [id number] by Dr. Brantley Stage.  Arm Pain: Pt has been having intermittent pain in his arm.  Cancer Screening: Skin CA: no skin CA. Pt is being seen by a dermatologist for psoriasis. FHx: Denies CAs in his family.   Immunizations: Pt would like to get a flu shot. Immunization History  Administered Date(s) Administered  . Influenza,inj,Quad PF,36+ Mos 06/21/2013, 07/20/2014, 07/17/2015  .  Pneumococcal Polysaccharide-23 11/20/2014  . Tdap 11/20/2014   Vision: Vision was not done today as he just came from his ophthalmologist and has dilated eyes. No exam data present  Dentist: Biannual appts, and just went for a cleaning.  Exercise: Breast walking and light jogging 3x a week.  Depressions: Denies dysphoric mood. Depression screen [facility] 2/9 06/23/2016 05/04/2016 04/02/2016 03/23/2016 07/17/2015  Decreased Interest 0 0 0 0 0  Down, Depressed, Hopeless 0 0 0 0 0  PHQ - 2 Score 0 0 0 0 0    Patient Active Problem List   Diagnosis Date Noted  . Diabetes ([place]) 05/31/2012  . Hypertension 05/31/2012   Past Medical History:  Diagnosis Date  . Diabetes mellitus    takes Janumet and Invokana daily  . Hernia, umbilical   . Hypertension    takes Lisinopril daily  . Seasonal allergies    takes Claritin daily and uses Flonase daily   Past Surgical History:  Procedure Laterality Date  . INSERTION OF MESH N/A 05/07/2016   Procedure: INSERTION OF MESH;  Surgeon: Erroll Luna, MD;  Location: [facility];  Service: General;  Laterality: N/A;  . NO PAST SURGERIES    . VENTRAL HERNIA REPAIR N/A 05/07/2016   Procedure: REPAIR VENTRAL HERNIA;  Surgeon: Erroll Luna, MD;  Location: [facility];  Service: General;  Laterality: N/A;   Allergies  Allergen Reactions  . Penicillins Hives and  Rash    Has patient had a PCN reaction causing immediate rash, facial/tongue/throat swelling, SOB or lightheadedness with hypotension: Yes Has patient had a PCN reaction causing severe rash involving mucus membranes or skin necrosis: No Has patient had a PCN reaction that required hospitalization No Has patient had a PCN reaction occurring within the last 10 years: No If all of the above answers are "NO", then may proceed with Cephalosporin use.    Prior to Admission medications   Medication Sig Start Date End Date Taking? Authorizing Provider  aspirin EC 81 MG tablet Take 81 mg by mouth daily.    Historical  Provider, MD  B Complex-C (B-COMPLEX WITH VITAMIN C) tablet Take 1 tablet by mouth daily.    Historical Provider, MD  canagliflozin (INVOKANA) 100 MG TABS tablet TAKE 1 TABLET (100 MG TOTAL) BY MOUTH DAILY. 03/23/16   [name], MD  cyclobenzaprine (FLEXERIL) 5 MG tablet 1 pill by mouth up to every 8 hours as needed. Start with one pill by mouth each bedtime as needed due to sedation 05/04/16   [name], MD  fluticasone [facility]) 50 MCG/ACT nasal spray Place 1 spray into both nostrils daily.    Historical Provider, MD  lisinopril (PRINIVIL,ZESTRIL) 10 MG tablet TAKE 1 TABLET (10 MG TOTAL) BY MOUTH DAILY 03/23/16   [name], MD  loratadine (CLARITIN) 10 MG tablet Take 10 mg by mouth daily.    Historical Provider, MD  meloxicam (MOBIC) 7.5 MG tablet Take 1 tablet (7.5 mg total) by mouth daily. 05/04/16   [name], MD  oxyCODONE-acetaminophen (PERCOCET/ROXICET) 5-325 MG tablet Take 1-2 tablets by mouth every 4 (four) hours as needed for moderate pain. 05/07/16   Erroll Luna, MD  sitaGLIPtin-metformin (JANUMET) 50-1000 MG tablet TAKE 1 TABLET BY MOUTH 2 (TWO) TIMES DAILY WITH A MEAL. 03/23/16   [name], MD  vitamin B-12 (CYANOCOBALAMIN) 1000 MCG tablet Take 1,000 mcg by mouth daily.    Historical Provider, MD  vitamin C (ASCORBIC ACID) 500 MG tablet Take 500 mg by mouth daily.    Historical Provider, MD   Social History   Social History  . Marital status: Single    Spouse name: N/A  . Number of children: N/A  . Years of education: N/A   Occupational History  . Not on file.   Social History Main Topics  . Smoking status: Never Smoker  . Smokeless tobacco: Never Used  . Alcohol use Yes     Comment: occ glass of wine.2-3 per month  . Drug use: No  . Sexual activity: Not on file   Other Topics Concern  . Not on file   Social History Narrative  . No narrative on file   Review of Systems  13 point ROS positive for arm pain, otherwise  negative. Objective:  Physical Exam  Constitutional: He is oriented to person, place, and time. He appears well-developed and well-nourished.  HENT:  Head: Normocephalic and atraumatic.  Right Ear: External ear normal.  Left Ear: External ear normal.  Mouth/Throat: Oropharynx is clear and moist.  Eyes: Conjunctivae and EOM are normal. Pupils are equal, round, and reactive to light.  Neck: Normal range of motion. Neck supple. No thyromegaly present.  Cardiovascular: Normal rate, regular rhythm, normal heart sounds and intact distal pulses.   Pulmonary/Chest: Effort normal and breath sounds normal. No respiratory distress. He has no wheezes.  Abdominal: Soft. He exhibits no distension. There is no tenderness. Hernia confirmed negative  in the right inguinal area and confirmed negative in the left inguinal area.  Genitourinary: No discharge found.  Genitourinary Comments: Lateral penile shaft: 2 erythematous patches without scale or discharge, no apparent ulcers or vesicles 1 small possible skin tag, no other lesions  Musculoskeletal: Normal range of motion. He exhibits no edema or tenderness.  Lymphadenopathy:    He has no cervical adenopathy.  Neurological: He is alert and oriented to person, place, and time. He has normal reflexes.  Skin: Skin is warm and dry.  Scab over the umbilicus but no erythema  Psychiatric: He has a normal mood and affect. His behavior is normal.  Vitals reviewed.  BP 118/76 (BP Location: Right Arm, Patient Position: Sitting, Cuff Size: Large)   Pulse (!) 101   Temp 98.3 F (36.8 C) (Oral)   Resp 17   Ht 5\' 4"  (1.626 m)   Wt 185 lb (83.9 kg)   SpO2 97%   BMI 31.76 kg/m     Assessment & Plan:   Logan [name] is a 45 y.o. male Annual physical exam  - -anticipatory guidance as below in AVS, screening labs above. Health maintenance items as above in HPI discussed/recommended as applicable.   Type 2 diabetes mellitus without complication, without  long-term current use of insulin (HCC) - Plan: Hemoglobin A1C, canagliflozin (INVOKANA) 100 MG TABS tablet, sitaGLIPtin-metformin (JANUMET) 50-1000 MG tablet  -Check A1c, no change in meds for now as tolerating current regimen  History of unprotected sex - Plan: RPR, HIV antibody, GC/Chlamydia Probe Amp Routine screening for STI (sexually transmitted infection) - Plan: RPR, HIV antibody, GC/Chlamydia Probe Amp  -STI testing performed  Needs flu shot - Plan: Flu Vaccine QUAD 36+ mos IM  Hyperlipidemia, unspecified hyperlipidemia type - Plan: COMPLETE METABOLIC PANEL WITH GFR, Lipid panel  -Check lipid panel, consider statin with history of diabetes. Will wait on results first. History of pain in arms, discuss that further at next visit, but not currently on statin to explain the symptoms  Essential hypertension - Plan: COMPLETE METABOLIC PANEL WITH GFR  Stable on current regimen, no change in doses for now. Labs pending  Rash of genital area  -Possible psoriasis, doubt STI, but testing performed as above. Can discuss with his dermatologist other treatments or workup for these lesions if needed  Meds ordered this encounter  Medications  . canagliflozin (INVOKANA) 100 MG TABS tablet    Sig: TAKE 1 TABLET (100 MG TOTAL) BY MOUTH DAILY.    Dispense:  90 tablet    Refill:  1  . sitaGLIPtin-metformin (JANUMET) 50-1000 MG tablet    Sig: TAKE 1 TABLET BY MOUTH 2 (TWO) TIMES DAILY WITH A MEAL.    Dispense:  90 tablet    Refill:  1   Patient Instructions   If you have more persistent pains in arms - return to discuss that further.  You will likely need to be on a cholesterol med, but can discuss this after blood results.  Plan on follow-up in 2 months, as if you do start a cholesterol medicine, we can recheck your levels as well as your liver tests at that time. No change in diabetes medicines for now. I will check your A1c today. Keep follow up with dermatologist to discuss reddened areas on  penis as those may also be from psoriasis. I will check a sexually transmitted infection testing, but does not appear to be one at this time.  Keeping you healthy  Get these tests  Blood  pressure- Have your blood pressure checked once a year by your healthcare provider.  Normal blood pressure is 120/80.  Weight- Have your body mass index (BMI) calculated to screen for obesity.  BMI is a measure of body fat based on height and weight. You can also calculate your own BMI at [employer]velB[employer].it.  Cholesterol- Have your cholesterol checked regularly starting at age 36, sooner may be necessary if you have diabetes, high blood pressure, if a family member developed heart diseases at an early age or if you smoke.   Chlamydia, HIV, and other sexual transmitted disease- Get screened each year until the age of 68 then within three months of each new sexual partner.  Diabetes- Have your blood sugar checked regularly if you have high blood pressure, high cholesterol, a family history of diabetes or if you are overweight.  Get these vaccines  Flu shot- Every fall.  Tetanus shot- Every 10 years.  Menactra- Single dose; prevents meningitis.  Take these steps  Don't smoke- If you do smoke, ask your healthcare provider about quitting. For tips on how to quit, go to www.smokefree.gov or call 1-800-QUIT-NOW.  Be physically active- Exercise 5 days a week for at least 30 minutes.  If you are not already physically active start slow and gradually work up to 30 minutes of moderate physical activity.  Examples of moderate activity include walking briskly, mowing the yard, dancing, swimming bicycling, etc.  Eat a healthy diet- Eat a variety of healthy foods such as fruits, vegetables, low fat milk, low fat cheese, yogurt, lean meats, poultry, fish, beans, tofu, etc.  For more information on healthy eating, go to www.thenutritionsource.org  Drink alcohol in moderation- Limit alcohol intake two drinks or  less a day.  Never drink and drive.  Dentist- Brush and floss teeth twice daily; visit your dentis twice a year.  Depression-Your emotional health is as important as your physical health.  If you're feeling down, losing interest in things you normally enjoy please talk with your healthcare provider.  Gun Safety- If you keep a gun in your home, keep it unloaded and with the safety lock on.  Bullets should be stored separately.  Helmet use- Always wear a helmet when riding a motorcycle, bicycle, rollerblading or skateboarding.  Safe sex- If you may be exposed to a sexually transmitted infection, use a condom  Seat belts- Seat bels can save your life; always wear one.  Smoke/Carbon Monoxide detectors- These detectors need to be installed on the appropriate level of your home.  Replace batteries at least once a year.  Skin Cancer- When out in the sun, cover up and use sunscreen SPF 15 or higher.  Violence- If anyone is threatening or hurting you, please tell your healthcare provider.  IF you received an x-ray today, you will receive an invoice from [facility]. Please contact [facility] at [phone] with questions or concerns regarding your invoice.   IF you received labwork today, you will receive an invoice from [employer]. Please contact [facility] at [phone] with questions or concerns regarding your invoice.   Our billing staff will not be able to assist you with questions regarding bills from these companies.  You will be contacted with the lab results as soon as they are available. The fastest way to get your results is to activate your My Chart account. Instructions are located on the last page of this paperwork. If you have not heard from [place] regarding the results in 2 weeks, please  contact this office.

## 2016-06-23 NOTE — Patient Instructions (Addendum)
If you have more persistent pains in arms - return to discuss that further.  You will likely need to be on a cholesterol med, but can discuss this after blood results.  Plan on follow-up in 2 months, as if you do start a cholesterol medicine, we can recheck your levels as well as your liver tests at that time. No change in diabetes medicines for now. I will check your A1c today. Keep follow up with dermatologist to discuss reddened areas on penis as those may also be from psoriasis. I will check a sexually transmitted infection testing, but does not appear to be one at this time.  Keeping you healthy  Get these tests  Blood pressure- Have your blood pressure checked once a year by your healthcare provider.  Normal blood pressure is 120/80.  Weight- Have your body mass index (BMI) calculated to screen for obesity.  BMI is a measure of body fat based on height and weight. You can also calculate your own BMI at [employer].  Cholesterol- Have your cholesterol checked regularly starting at age 47, sooner may be necessary if you have diabetes, high blood pressure, if a family member developed heart diseases at an early age or if you smoke.   Chlamydia, HIV, and other sexual transmitted disease- Get screened each year until the age of 47 then within three months of each new sexual partner.  Diabetes- Have your blood sugar checked regularly if you have high blood pressure, high cholesterol, a family history of diabetes or if you are overweight.  Get these vaccines  Flu shot- Every fall.  Tetanus shot- Every 10 years.  Menactra- Single dose; prevents meningitis.  Take these steps  Don't smoke- If you do smoke, ask your healthcare provider about quitting. For tips on how to quit, go to www.smokefree.gov or call 1-800-QUIT-NOW.  Be physically active- Exercise 5 days a week for at least 30 minutes.  If you are not already physically active start slow and gradually work up to 30  minutes of moderate physical activity.  Examples of moderate activity include walking briskly, mowing the yard, dancing, swimming bicycling, etc.  Eat a healthy diet- Eat a variety of healthy foods such as fruits, vegetables, low fat milk, low fat cheese, yogurt, lean meats, poultry, fish, beans, tofu, etc.  For more information on healthy eating, go to www.thenutritionsource.org  Drink alcohol in moderation- Limit alcohol intake two drinks or less a day.  Never drink and drive.  Dentist- Brush and floss teeth twice daily; visit your dentis twice a year.  Depression-Your emotional health is as important as your physical health.  If you're feeling down, losing interest in things you normally enjoy please talk with your healthcare provider.  Gun Safety- If you keep a gun in your home, keep it unloaded and with the safety lock on.  Bullets should be stored separately.  Helmet use- Always wear a helmet when riding a motorcycle, bicycle, rollerblading or skateboarding.  Safe sex- If you may be exposed to a sexually transmitted infection, use a condom  Seat belts- Seat bels can save your life; always wear one.  Smoke/Carbon Monoxide detectors- These detectors need to be installed on the appropriate level of your home.  Replace batteries at least once a year.  Skin Cancer- When out in the sun, cover up and use sunscreen SPF 15 or higher.  Violence- If anyone is threatening or hurting you, please tell your healthcare provider.  IF you received an x-ray today, you will  receive an Pharmacologist from [facility]. Please contact [facility] at [phone] with questions or concerns regarding your invoice.   IF you received labwork today, you will receive an invoice from [employer]. Please contact [facility] at [phone] with questions or concerns regarding your invoice.   Our billing staff will not be able to assist you with questions regarding bills from  these companies.  You will be contacted with the lab results as soon as they are available. The fastest way to get your results is to activate your My Chart account. Instructions are located on the last page of this paperwork. If you have not heard from [place] regarding the results in 2 weeks, please contact this office.

## 2016-06-24 LAB — GC/CHLAMYDIA PROBE AMP
CT Probe RNA: NOT DETECTED
GC Probe RNA: NOT DETECTED

## 2016-06-24 LAB — RPR

## 2016-07-08 ENCOUNTER — Telehealth: Payer: Self-pay | Admitting: Radiology

## 2016-07-08 NOTE — Telephone Encounter (Signed)
Pt calling in for labwork forms for his physical

## 2016-07-08 NOTE — Telephone Encounter (Signed)
See previous lab call. If he needs a copy of those labs printed(CMP, lipid panel) or we need to fax the CMP and lipid panel, can do so if needed. He should have the original form from last visit, and can either fill in that information, or bring to [place] to fill in that information.

## 2016-07-09 NOTE — Telephone Encounter (Signed)
Pt states he will try to locate his copy of the form and bring it up here to [place]. It needs to be singed my a physican and faxed.

## 2016-08-27 ENCOUNTER — Ambulatory Visit: Payer: BLUE CROSS/BLUE SHIELD | Admitting: Family Medicine

## 2016-09-30 ENCOUNTER — Other Ambulatory Visit: Payer: Self-pay | Admitting: Family Medicine

## 2016-09-30 DIAGNOSIS — E119 Type 2 diabetes mellitus without complications: Secondary | ICD-10-CM

## 2016-09-30 DIAGNOSIS — I10 Essential (primary) hypertension: Secondary | ICD-10-CM

## 2016-11-03 ENCOUNTER — Ambulatory Visit (INDEPENDENT_AMBULATORY_CARE_PROVIDER_SITE_OTHER): Payer: BLUE CROSS/BLUE SHIELD | Admitting: Family Medicine

## 2016-11-03 VITALS — BP 121/78 | HR 111 | Temp 98.1°F | Resp 17 | Ht 64.0 in | Wt 188.0 lb

## 2016-11-03 DIAGNOSIS — E785 Hyperlipidemia, unspecified: Secondary | ICD-10-CM

## 2016-11-03 DIAGNOSIS — J309 Allergic rhinitis, unspecified: Secondary | ICD-10-CM

## 2016-11-03 DIAGNOSIS — I10 Essential (primary) hypertension: Secondary | ICD-10-CM | POA: Diagnosis not present

## 2016-11-03 DIAGNOSIS — E119 Type 2 diabetes mellitus without complications: Secondary | ICD-10-CM | POA: Diagnosis not present

## 2016-11-03 MED ORDER — ATORVASTATIN CALCIUM 10 MG PO TABS: 10.0000 mg | ORAL_TABLET | Freq: Every day | ORAL | 1 refills | Status: DC

## 2016-11-03 MED ORDER — SITAGLIPTIN PHOS-METFORMIN HCL 50-1000 MG PO TABS: 1.0000 | ORAL_TABLET | Freq: Two times a day (BID) | ORAL | 2 refills | Status: DC

## 2016-11-03 MED ORDER — DAPAGLIFLOZIN PROPANEDIOL 5 MG PO TABS: 5.0000 mg | ORAL_TABLET | Freq: Every day | ORAL | 5 refills | Status: DC

## 2016-11-03 MED ORDER — LISINOPRIL 10 MG PO TABS: 10.0000 mg | ORAL_TABLET | Freq: Every day | ORAL | 0 refills | Status: DC

## 2016-11-03 MED ORDER — FLUTICASONE PROPIONATE 50 MCG/ACT NA SUSP: 2.0000 | Freq: Every day | NASAL | 6 refills | Status: DC

## 2016-11-03 NOTE — Patient Instructions (Addendum)
For diabetes medication, try to see if there is a voucher for Januvia each month, or ask your pharmacist for other options in that class that may be cheaper with your insurance.   Change the Invokana to [place].  This also comes as a combo with metformin if that works better for copays. let me know if different classes of medicines are needed.   For now recheck in 6 weeks to repeat lipid testing. Lipitor once per day for now. You may need to be on a higher dose in the future, but we can start with that dose at this time.    IF you received an x-ray today, you will receive an invoice from [facility]. Please contact [facility] at [phone] with questions or concerns regarding your invoice.   IF you received labwork today, you will receive an invoice from [place]. Please contact [facility] at [phone] with questions or concerns regarding your invoice.   Our billing staff will not be able to assist you with questions regarding bills from these companies.  You will be contacted with the lab results as soon as they are available. The fastest way to get your results is to activate your My Chart account. Instructions are located on the last page of this paperwork. If you have not heard from [place] regarding the results in 2 weeks, please contact this office.

## 2016-11-03 NOTE — Progress Notes (Signed)
Subjective:  By signing my name below, I, Logan [name], attest that this documentation has been prepared under the direction and in the presence of Logan Agreste, MD Electronically Signed: [name], ED Scribe 11/03/2016 at 11:23 AM.   Patient ID: Logan [name], male    DOB: 1971-01-09, 46 y.o.   MRN: [id number]  Chief Complaint  Patient presents with  . Medication Refill    all on list but wants cheaper options    HPI  Logan [name] is a 46 y.o. male who presents to Primary Care at [facility] for all medications to be refilled.  Last seen by me for CPE 06/23/16. Pt states that he switched insurance companies and is now unable to afford all of his previous medications. He requests generics at this visit.   URI Pt reports cold-like symptoms of nasal congestion and HAs onset yesterday. No treatments tried PTA. He denies cough, light-headedness, dizziness, chest pain, sob.   Rash Pt reports generalized episodic rash to upper and lower extremities. He is being evaluated by a dermatologist but has not noticed significant relief. Pt requests a referral to an allergist at this visit.   Diabetes At last visit he was on [place] and [place]. Pt decided to remain on those doses with folllow-up in 3 months. He ran out of his prescription in January. Pt states that he had some Metformin leftover so he has been taking Metfromin 1000 mg bid with readings averaging 250, 290 after eating. He denies nausea, vomiting, abdominal pain.  Lab Results  Component Value Date   HGBA1C 7.1 (H) 06/23/2016   Lab Results  Component Value Date   MICROALBUR 1.7 07/17/2015   Hyperlipidemia  Lab Results  Component Value Date   CHOL 210 (H) 06/23/2016   HDL 40 (L) 06/23/2016   LDLCALC 146 (H) 06/23/2016   TRIG 121 06/23/2016   CHOLHDL 5.3 (H) 06/23/2016  Reccommended starting Lipitor or Crestor. Advised to let me know based on lab results but has not yet started statin. He denies low blood glucose  readings.   HTN Lab Results  Component Value Date   CREATININE 0.86 06/23/2016  Takes Lisinopril 10 mg daily.   Patient Active Problem List   Diagnosis Date Noted  . Diabetes ([place]) 05/31/2012  . Hypertension 05/31/2012   Past Medical History:  Diagnosis Date  . Diabetes mellitus    takes Janumet and Invokana daily  . Hernia, umbilical   . Hypertension    takes Lisinopril daily  . Seasonal allergies    takes Claritin daily and uses Flonase daily   Past Surgical History:  Procedure Laterality Date  . INSERTION OF MESH N/A 05/07/2016   Procedure: INSERTION OF MESH;  Surgeon: Logan Luna, MD;  Location: [place];  Service: General;  Laterality: N/A;  . NO PAST SURGERIES    . VENTRAL HERNIA REPAIR N/A 05/07/2016   Procedure: REPAIR VENTRAL HERNIA;  Surgeon: Logan Luna, MD;  Location: [place];  Service: General;  Laterality: N/A;   Allergies  Allergen Reactions  . Penicillins Hives and Rash    Has patient had a PCN reaction causing immediate rash, facial/tongue/throat swelling, SOB or lightheadedness with hypotension: Yes Has patient had a PCN reaction causing severe rash involving mucus membranes or skin necrosis: No Has patient had a PCN reaction that required hospitalization No Has patient had a PCN reaction occurring within the last 10 years: No If all of the above answers are "NO", then may proceed with Cephalosporin use.  Prior to Admission medications   Medication Sig Start Date End Date Taking? Authorizing Provider  lisinopril (PRINIVIL,ZESTRIL) 10 MG tablet TAKE ONE TABLET BY MOUTH DAILY 09/30/16  Yes Logan Coop, PA-C  aspirin EC 81 MG tablet Take 81 mg by mouth daily.    Historical Provider, MD  B Complex-C (B-COMPLEX WITH VITAMIN C) tablet Take 1 tablet by mouth daily.    Historical Provider, MD  canagliflozin (INVOKANA) 100 MG TABS tablet TAKE 1 TABLET (100 MG TOTAL) BY MOUTH DAILY. Patient not taking: Reported on 11/03/2016 06/23/16   Logan Agreste, MD    cyclobenzaprine (FLEXERIL) 5 MG tablet 1 pill by mouth up to every 8 hours as needed. Start with one pill by mouth each bedtime as needed due to sedation Patient not taking: Reported on 06/23/2016 05/04/16   Logan Agreste, MD  JANUMET 50-1000 MG tablet TAKE ONE TABLET BY MOUTH TWICE A DAY WITH MEALS Patient not taking: Reported on 11/03/2016 09/30/16   Logan Coop, PA-C  loratadine (CLARITIN) 10 MG tablet Take 10 mg by mouth daily.    Historical Provider, MD  oxyCODONE-acetaminophen (PERCOCET/ROXICET) 5-325 MG tablet Take 1-2 tablets by mouth every 4 (four) hours as needed for moderate pain. Patient not taking: Reported on 06/23/2016 05/07/16   Logan Luna, MD  vitamin B-12 (CYANOCOBALAMIN) 1000 MCG tablet Take 1,000 mcg by mouth daily.    Historical Provider, MD  vitamin C (ASCORBIC ACID) 500 MG tablet Take 500 mg by mouth daily.    Historical Provider, MD   Social History   Social History  . Marital status: Single    Spouse name: N/A  . Number of children: N/A  . Years of education: N/A   Occupational History  . Not on file.   Social History Main Topics  . Smoking status: Never Smoker  . Smokeless tobacco: Never Used  . Alcohol use Yes     Comment: occ glass of wine.2-3 per month  . Drug use: No  . Sexual activity: No   Other Topics Concern  . Not on file   Social History Narrative  . No narrative on file   Review of Systems  Constitutional: Negative for fatigue and unexpected weight change.  HENT: Positive for congestion.   Eyes: Negative for visual disturbance.  Respiratory: Negative for cough, chest tightness and shortness of breath.   Cardiovascular: Negative for chest pain, palpitations and leg swelling.  Gastrointestinal: Negative for abdominal pain, blood in stool, nausea and vomiting.  Skin: Positive for rash.  Neurological: Positive for headaches. Negative for dizziness and light-headedness.      Objective:   Physical Exam  Constitutional: He is  oriented to person, place, and time. He appears well-developed and well-nourished.  HENT:  Head: Normocephalic and atraumatic.  Eyes: EOM are normal. Pupils are equal, round, and reactive to light.  Neck: No JVD present. Carotid bruit is not present.  Cardiovascular: Normal rate, regular rhythm and normal heart sounds.   No murmur heard. Pulmonary/Chest: Effort normal and breath sounds normal. He has no rales.  Abdominal:  Healed scar on umbilicus  Musculoskeletal: He exhibits no edema.  Neurological: He is alert and oriented to person, place, and time.  Skin: Skin is warm and dry.  Psychiatric: He has a normal mood and affect.  Vitals reviewed.   Vitals:   11/03/16 1015  BP: 121/78  Pulse: (!) 111  Resp: 17  Temp: 98.1 F (36.7 C)  TempSrc: Oral  SpO2: 97%  Weight:  188 lb (85.3 kg)  Height: 5\' 4"  (1.626 m)      Assessment & Plan:   YUVIN BUSSIERE is a 46 y.o. male Hyperlipidemia, unspecified hyperlipidemia type - Plan: atorvastatin (LIPITOR) 10 MG tablet  - Start Lipitor 10 mg daily. Potential for higher dose needed, but will make sure he tolerates this to begin with. Recheck 6 weeks.  Type 2 diabetes mellitus without complication, without long-term current use of insulin (HCC) - Plan: Microalbumin, urine, dapagliflozin propanediol (FARXIGA) 5 MG TABS tablet, sitaGLIPtin-metformin (JANUMET) 50-1000 MG tablet, Hemoglobin A1C  -Invokana was changed to [place] as voucher available in trying to cut down on cost of medication. Continue Janumet at same dose also with discount card. He can look into different options with combination of metformin with either Januvia or the [place] with these discount cards, prior to changing to other classes. Recheck in 6 weeks. Current A1c will likely be elevated as he has not been on usual medications.  Essential hypertension - Plan: lisinopril (PRINIVIL,ZESTRIL) 10 MG tablet  - Overall stable, continue lisinopril 10 mg daily.  Allergic  rhinitis, unspecified chronicity, unspecified seasonality, unspecified trigger - Plan: Ambulatory referral to Allergy  -Over-the-counter treatments discussed, but will refer to allergist at his request for testing of potential cause.  Meds ordered this encounter  Medications  . dapagliflozin propanediol (FARXIGA) 5 MG TABS tablet    Sig: Take 5 mg by mouth daily.    Dispense:  30 tablet    Refill:  5  . sitaGLIPtin-metformin (JANUMET) 50-1000 MG tablet    Sig: Take 1 tablet by mouth 2 (two) times daily with a meal.    Dispense:  60 tablet    Refill:  2  . lisinopril (PRINIVIL,ZESTRIL) 10 MG tablet    Sig: Take 1 tablet (10 mg total) by mouth daily.    Dispense:  90 tablet    Refill:  0  . atorvastatin (LIPITOR) 10 MG tablet    Sig: Take 1 tablet (10 mg total) by mouth daily.    Dispense:  90 tablet    Refill:  1  . fluticasone (FLONASE) 50 MCG/ACT nasal spray    Sig: Place 2 sprays into both nostrils daily.    Dispense:  16 g    Refill:  6   Patient Instructions   For diabetes medication, try to see if there is a voucher for Januvia each month, or ask your pharmacist for other options in that class that may be cheaper with your insurance.   Change the Invokana to [place].  This also comes as a combo with metformin if that works better for copays. let me know if different classes of medicines are needed.   For now recheck in 6 weeks to repeat lipid testing. Lipitor once per day for now. You may need to be on a higher dose in the future, but we can start with that dose at this time.    IF you received an x-ray today, you will receive an invoice from [facility]. Please contact [facility] at [phone] with questions or concerns regarding your invoice.   IF you received labwork today, you will receive an invoice from [place]. Please contact [facility] at [phone] with questions or concerns regarding your invoice.   Our billing staff will not be able to  assist you with questions regarding bills from these companies.  You will be contacted with the lab results as soon as they are available. The fastest way to get your results  is to activate your My Chart account. Instructions are located on the last page of this paperwork. If you have not heard from [place] regarding the results in 2 weeks, please contact this office.       I personally performed the services described in this documentation, which was scribed in my presence. The recorded information has been reviewed and considered for accuracy and completeness, addended by me as needed, and agree with information above.  Signed,   [name], MD [facility] at [place].  11/04/16 10:09 PM

## 2016-11-04 LAB — HEMOGLOBIN A1C
Est. average glucose Bld gHb Est-mCnc: 240 mg/dL
Hgb A1c MFr Bld: 10 % — ABNORMAL HIGH (ref 4.8–5.6)

## 2016-11-04 LAB — MICROALBUMIN, URINE: Microalbumin, Urine: 22.1 ug/mL

## 2016-12-07 ENCOUNTER — Telehealth: Payer: Self-pay | Admitting: Family Medicine

## 2016-12-07 NOTE — Telephone Encounter (Signed)
Pt dropped off RX assistance form to be completed by Dr Vicki Mallet phone for pt is [phone]

## 2016-12-08 ENCOUNTER — Encounter: Payer: Self-pay | Admitting: Allergy and Immunology

## 2016-12-08 ENCOUNTER — Ambulatory Visit (INDEPENDENT_AMBULATORY_CARE_PROVIDER_SITE_OTHER): Payer: BLUE CROSS/BLUE SHIELD | Admitting: Allergy and Immunology

## 2016-12-08 VITALS — BP 112/68 | HR 92 | Temp 98.2°F | Resp 16 | Ht 62.0 in | Wt 182.0 lb

## 2016-12-08 DIAGNOSIS — I10 Essential (primary) hypertension: Secondary | ICD-10-CM

## 2016-12-08 DIAGNOSIS — J3089 Other allergic rhinitis: Secondary | ICD-10-CM

## 2016-12-08 DIAGNOSIS — L409 Psoriasis, unspecified: Secondary | ICD-10-CM

## 2016-12-08 MED ORDER — XHANCE 93 MCG/ACT NA EXHU: 2.0000 | INHALANT_SUSPENSION | Freq: Two times a day (BID) | NASAL | 1 refills | Status: DC

## 2016-12-08 MED ORDER — CARBINOXAMINE MALEATE 6 MG PO TABS: 1.0000 | ORAL_TABLET | Freq: Two times a day (BID) | ORAL | 3 refills | Status: DC

## 2016-12-08 NOTE — Telephone Encounter (Signed)
PT CALLING BACK ABOUT HIS FORM PLEASE CALL PT WHEN FORM IS READY TO BE PICKED UP

## 2016-12-08 NOTE — Progress Notes (Signed)
New Patient Note  RE: Logan [name] MRN: [id number] DOB: [birth year] Date of Office Visit: 12/08/2016  Referring provider: [name], MD Primary care provider: [name], MD  Chief Complaint: Nasal Congestion; Sinus Problem; and Rash   History of present illness: Logan [name] is a 46 y.o. male seen today in consultation requested by Logan Ray, MD.  He reports that for many years he has experienced nasal congestion, thick postnasal drainage causing throat clearing "constantly", as well as rhinorrhea, sneezing, mild lacrimation, and occasional sinus pressure.  These symptoms occur year round, though he has experienced mild increase in the frequency of sneezing during springtime.  Loratadine and triamcinolone nasal spray have provided moderate relief.  He has used oxymetazoline in the past, though was advised by his primary care physician not to use this on a regular basis.  A few years ago, he had tympanostomy tubes placed bilaterally for the relief of ear pressure leading to vertigo.  Logan [name] reports that approximately one year ago he began to develop rashes which have appeared on his arms, legs, back, face, and scalp.  He has been evaluated by a dermatologist and has been prescribed clobetasol foam for his scalp with benefit as well as a vitamin D preparation for the lesions on his body.  The dermatologist has diagnosed the rash as plaque psoriasis, though the patient is interested in assessing his atopic status to determine if there is an allergic etiology.   Assessment and plan: Allergic rhinitis with a nonallergic component  Aeroallergen avoidance measures have been discussed and provided in written form.  A prescription has been provided for [facility], 2 actuations per nostril twice a day. Proper technique has been discussed and demonstrated.  Nasal saline lavage (NeilMed) has been recommended as needed and prior to medicated nasal sprays along with instructions for  proper administration.  A prescription has been provided for RyVent (carbinoxamine maleate) 6mg  every 6-8 hours as needed.  Psoriasis The history and physical examination suggest plaque psoriasis.  Continue clobetasol 0.05% foam sparingly to affected areas twice a day as needed.  Continue follow up with dermatologist for monitoring and treatment adjustments.  Hypertension  Given the history of hypertension, decongestants including pseudoephedrine and phenylephrine will be avoided.  Continue antihypertensives as prescribed and follow up with primary care physician for monitoring and treatment adjustments.   Meds ordered this encounter  Medications  . XHANCE 93 MCG/ACT EXHU    Sig: Place 2 sprays into the nose 2 (two) times daily.    Dispense:  32 mL    Refill:  1    [id number] (M)  . Carbinoxamine Maleate (RYVENT) 6 MG TABS    Sig: Take 1 tablet by mouth 2 (two) times daily.    Dispense:  60 tablet    Refill:  3    Diagnostics: Epicutaneous testing: Positive to tree pollen. Intradermal testing: Positive to [place] grass and perennial mold mix 4. Food allergen skin testing:  Negative despite a positive histamine control.    Physical examination: Blood pressure 112/68, pulse 92, temperature 98.2 F (36.8 C), temperature source Oral, resp. rate 16, height 5\' 2"  (1.575 m), weight 182 lb (82.6 kg), SpO2 95 %.  General: Alert, interactive, in no acute distress. HEENT: TMs pearly gray, turbinates moderately edematous with crusty discharge, post-pharynx moderately erythematous. Neck: Supple without lymphadenopathy. Lungs: Clear to auscultation without wheezing, rhonchi or rales. CV: Normal S1, S2 without murmurs. Abdomen: Nondistended, nontender. Skin: Well-demarcated, erythematous plaques with silver scale on his  back, left forearm, and lower extremities. Extremities:  No clubbing, cyanosis or edema. Neuro:   Grossly intact.  Review of systems:  Review of systems  negative except as noted in HPI / PMHx or noted below: Review of Systems  Constitutional: Negative.   HENT: Negative.   Eyes: Negative.   Respiratory: Negative.   Cardiovascular: Negative.   Gastrointestinal: Negative.   Genitourinary: Negative.   Musculoskeletal: Negative.   Skin: Negative.   Neurological: Negative.   Endo/Heme/Allergies: Negative.   Psychiatric/Behavioral: Negative.     Past medical history:  Past Medical History:  Diagnosis Date  . Diabetes mellitus    takes Janumet and Invokana daily  . Hernia, umbilical   . Hypertension    takes Lisinopril daily  . Plaque psoriasis   . Seasonal allergies    takes Claritin daily and uses Flonase daily    Past surgical history:  Past Surgical History:  Procedure Laterality Date  . INSERTION OF MESH N/A 05/07/2016   Procedure: INSERTION OF MESH;  Surgeon: Erroll Luna, MD;  Location: [place];  Service: General;  Laterality: N/A;  . TYMPANOSTOMY TUBE PLACEMENT    . VENTRAL HERNIA REPAIR N/A 05/07/2016   Procedure: REPAIR VENTRAL HERNIA;  Surgeon: Erroll Luna, MD;  Location: [place];  Service: General;  Laterality: N/A;    Family history: Family History  Problem Relation Age of Onset  . Hypertension Mother   . Diverticulosis Mother   . Osteoporosis Mother   . Diabetes Father   . Alcohol abuse Father   . Osteoporosis Sister   . Hypertension Sister   . Diabetes Brother   . Hypertension Brother   . Hypertension Maternal Grandmother   . Heart disease Maternal Grandfather   . Diabetes Paternal Grandfather   . Diabetes Brother   . Hypertension Brother   . Heart attack Brother   . Osteoporosis Sister     Social history: Social History   Social History  . Marital status: Single    Spouse name: N/A  . Number of children: N/A  . Years of education: N/A   Occupational History  . Not on file.   Social History Main Topics  . Smoking status: Never Smoker  . Smokeless tobacco: Never Used  . Alcohol use Yes       Comment: occ glass of wine.2-3 per month  . Drug use: No  . Sexual activity: No   Other Topics Concern  . Not on file   Social History Narrative  . No narrative on file   Environmental History: The patient lives in 48 year old condominium with carpeting throughout and central air/heat.  There is no known mold/water damage and condominium.  He is a nonsmoker.  Allergies as of 12/08/2016      Reactions   Penicillins Hives, Rash   Has patient had a PCN reaction causing immediate rash, facial/tongue/throat swelling, SOB or lightheadedness with hypotension: Yes Has patient had a PCN reaction causing severe rash involving mucus membranes or skin necrosis: No Has patient had a PCN reaction that required hospitalization No Has patient had a PCN reaction occurring within the last 10 years: No If all of the above answers are "NO", then may proceed with Cephalosporin use.      Medication List       Accurate as of 12/08/16 11:23 AM. Always use your most recent med list.          aspirin EC 81 MG tablet Take 81 mg by mouth daily.   atorvastatin  10 MG tablet Commonly known as:  LIPITOR Take 1 tablet (10 mg total) by mouth daily.   B-complex with vitamin C tablet Take 1 tablet by mouth daily.   Carbinoxamine Maleate 6 MG Tabs Commonly known as:  RYVENT Take 1 tablet by mouth 2 (two) times daily.   cyclobenzaprine 5 MG tablet Commonly known as:  FLEXERIL 1 pill by mouth up to every 8 hours as needed. Start with one pill by mouth each bedtime as needed due to sedation   dapagliflozin propanediol 5 MG Tabs tablet Commonly known as:  FARXIGA Take 5 mg by mouth daily.   lisinopril 10 MG tablet Commonly known as:  PRINIVIL,ZESTRIL Take 1 tablet (10 mg total) by mouth daily.   loratadine 10 MG tablet Commonly known as:  CLARITIN Take 10 mg by mouth daily.   multivitamin with minerals Tabs tablet Take 1 tablet by mouth daily.   sitaGLIPtin-metformin 50-1000 MG  tablet Commonly known as:  JANUMET Take 1 tablet by mouth 2 (two) times daily with a meal.   vitamin B-12 1000 MCG tablet Commonly known as:  CYANOCOBALAMIN Take 1,000 mcg by mouth daily.   vitamin C 500 MG tablet Commonly known as:  ASCORBIC ACID Take 500 mg by mouth daily.   XHANCE 93 MCG/ACT Exhu Generic drug:  Fluticasone Propionate Place 2 sprays into the nose 2 (two) times daily.       Known medication allergies: Allergies  Allergen Reactions  . Penicillins Hives and Rash    Has patient had a PCN reaction causing immediate rash, facial/tongue/throat swelling, SOB or lightheadedness with hypotension: Yes Has patient had a PCN reaction causing severe rash involving mucus membranes or skin necrosis: No Has patient had a PCN reaction that required hospitalization No Has patient had a PCN reaction occurring within the last 10 years: No If all of the above answers are "NO", then may proceed with Cephalosporin use.     I appreciate the opportunity to take part in Rector's care. Please do not hesitate to contact me with questions.  Sincerely,   R. Edgar Frisk, MD

## 2016-12-08 NOTE — Assessment & Plan Note (Signed)
   Given the history of hypertension, decongestants including pseudoephedrine and phenylephrine will be avoided.  Continue antihypertensives as prescribed and follow up with primary care physician for monitoring and treatment adjustments.

## 2016-12-08 NOTE — Telephone Encounter (Signed)
It's there now.

## 2016-12-08 NOTE — Patient Instructions (Addendum)
Allergic rhinitis with a nonallergic component  Aeroallergen avoidance measures have been discussed and provided in written form.  A prescription has been provided for [facility], 2 actuations per nostril twice a day. Proper technique has been discussed and demonstrated.  Nasal saline lavage (NeilMed) has been recommended as needed and prior to medicated nasal sprays along with instructions for proper administration.  A prescription has been provided for RyVent (carbinoxamine maleate) 6mg  every 6-8 hours as needed.  Psoriasis The history and physical examination suggest plaque psoriasis.  Continue clobetasol 0.05% foam sparingly to affected areas twice a day as needed.  Continue follow up with dermatologist for monitoring and treatment adjustments.  Hypertension  Given the history of hypertension, decongestants including pseudoephedrine and phenylephrine will be avoided.  Continue antihypertensives as prescribed and follow up with primary care physician for monitoring and treatment adjustments.   Return in about 4 months (around 04/10/2017), or if symptoms worsen or fail to improve.  Reducing Pollen Exposure  The American Academy of Allergy, Asthma and Immunology suggests the following steps to reduce your exposure to pollen during allergy seasons.    1. Do not hang sheets or clothing out to dry; pollen may collect on these items. 2. Do not mow lawns or spend time around freshly cut grass; mowing stirs up pollen. 3. Keep windows closed at night.  Keep car windows closed while driving. 4. Minimize morning activities outdoors, a time when pollen counts are usually at their highest. 5. Stay indoors as much as possible when pollen counts or humidity is high and on windy days when pollen tends to remain in the air longer. 6. Use air conditioning when possible.  Many air conditioners have filters that trap the pollen spores. 7. Use a HEPA room air filter to remove pollen form the indoor air  you breathe.   Control of Mold Allergen  Mold and fungi can grow on a variety of surfaces provided certain temperature and moisture conditions exist.  Outdoor molds grow on plants, decaying vegetation and soil.  The major outdoor mold, Alternaria and Cladosporium, are found in very high numbers during hot and dry conditions.  Generally, a late Summer - Fall peak is seen for common outdoor fungal spores.  Rain will temporarily lower outdoor mold spore count, but counts rise rapidly when the rainy period ends.  The most important indoor molds are Aspergillus and Penicillium.  Dark, humid and poorly ventilated basements are ideal sites for mold growth.  The next most common sites of mold growth are the bathroom and the kitchen.  Outdoor [employer] 2. Use air conditioning and keep windows closed 3. Avoid exposure to decaying vegetation. 4. Avoid leaf raking. 5. Avoid grain handling. 6. Consider wearing a face mask if working in moldy areas.  Indoor Mold Control 1. Maintain humidity below 50%. 2. Clean washable surfaces with 5% bleach solution. 3. Remove sources e.g. Contaminated carpets.

## 2016-12-08 NOTE — Telephone Encounter (Signed)
I do not see a form in my chartbox.

## 2016-12-08 NOTE — Telephone Encounter (Signed)
Form completed for both Janumet and Farxiga as I do not see which one it applies to. Pleas let me know if this need to be completed differently.

## 2016-12-08 NOTE — Assessment & Plan Note (Addendum)
   Aeroallergen avoidance measures have been discussed and provided in written form.  A prescription has been provided for [facility], 2 actuations per nostril twice a day. Proper technique has been discussed and demonstrated.  Nasal saline lavage (NeilMed) has been recommended as needed and prior to medicated nasal sprays along with instructions for proper administration.  A prescription has been provided for RyVent (carbinoxamine maleate) 6mg  every 6-8 hours as needed.

## 2016-12-08 NOTE — Telephone Encounter (Signed)
In greenes box

## 2016-12-08 NOTE — Assessment & Plan Note (Addendum)
The history and physical examination suggest plaque psoriasis.  Continue clobetasol 0.05% foam sparingly to affected areas twice a day as needed.  Continue follow up with dermatologist for monitoring and treatment adjustments.

## 2016-12-09 NOTE — Telephone Encounter (Signed)
Forms received from nurse box. Pt aware and informed forms are ready for pick up

## 2016-12-28 ENCOUNTER — Other Ambulatory Visit: Payer: Self-pay | Admitting: Physician Assistant

## 2016-12-28 DIAGNOSIS — I10 Essential (primary) hypertension: Secondary | ICD-10-CM

## 2016-12-29 NOTE — Telephone Encounter (Signed)
Please review. Philis Fendt, MS, PA-C 10:17 AM, 12/29/2016

## 2017-01-01 ENCOUNTER — Ambulatory Visit (INDEPENDENT_AMBULATORY_CARE_PROVIDER_SITE_OTHER): Payer: BLUE CROSS/BLUE SHIELD | Admitting: Physician Assistant

## 2017-01-01 ENCOUNTER — Encounter: Payer: Self-pay | Admitting: Physician Assistant

## 2017-01-01 VITALS — BP 136/89 | HR 83 | Temp 98.9°F | Resp 16 | Ht 62.8 in | Wt 182.8 lb

## 2017-01-01 DIAGNOSIS — R42 Dizziness and giddiness: Secondary | ICD-10-CM | POA: Diagnosis not present

## 2017-01-01 DIAGNOSIS — R509 Fever, unspecified: Secondary | ICD-10-CM

## 2017-01-01 DIAGNOSIS — R109 Unspecified abdominal pain: Secondary | ICD-10-CM | POA: Diagnosis not present

## 2017-01-01 LAB — POCT CBC
Granulocyte percent: 68.7 %G (ref 37–80)
HCT, POC: 47.8 % (ref 43.5–53.7)
Hemoglobin: 15.9 g/dL (ref 14.1–18.1)
Lymph, poc: 2.4 (ref 0.6–3.4)
MCH, POC: 29.1 pg (ref 27–31.2)
MCHC: 33.3 g/dL (ref 31.8–35.4)
MCV: 87.3 fL (ref 80–97)
MID (cbc): 0.8 (ref 0–0.9)
MPV: 7.8 fL (ref 0–99.8)
POC Granulocyte: 7 — AB (ref 2–6.9)
POC LYMPH PERCENT: 23.4 %L (ref 10–50)
POC MID %: 7.9 %M (ref 0–12)
Platelet Count, POC: 396 10*3/uL (ref 142–424)
RBC: 5.47 M/uL (ref 4.69–6.13)
RDW, POC: 13.9 %
WBC: 10.2 10*3/uL (ref 4.6–10.2)

## 2017-01-01 LAB — POCT URINALYSIS DIP (MANUAL ENTRY)
Bilirubin, UA: NEGATIVE
Blood, UA: NEGATIVE
Glucose, UA: 500 mg/dL — AB
Leukocytes, UA: NEGATIVE
Nitrite, UA: NEGATIVE
Spec Grav, UA: 1.02 (ref 1.010–1.025)
Urobilinogen, UA: 0.2 E.U./dL
pH, UA: 5 (ref 5.0–8.0)

## 2017-01-01 NOTE — Progress Notes (Signed)
PRIMARY CARE AT [facility] [address] [place] [address] [phone]  Date:  01/01/2017   Name:  Logan [name]   DOB:  07-25-1971   MRN:  [id number]  PCP:  [name], MD    History of Present Illness:  Logan [name] is a 46 y.o. male patient who presents to PCP with  Chief Complaint  Patient presents with  . Illness    per patient, had low grade fever, diarrhea, nausea and rapid heartbeat a few days ag  . Pain    patient c/o all over body pain     Feeling dizziness, and weakness.  Fever of 100.  He took tylenol which helped to resolve the symptoms.  He had bouts of nausea, diarrhea.   He notes improvement off symptoms today.  No fevers.  No urinary symptoms of dysuria, frequency, or hematuria.  No UR symptoms. He is not hydrating well.   Patient Active Problem List   Diagnosis Date Noted  . Allergic rhinitis with a nonallergic component 12/08/2016  . Psoriasis 12/08/2016  . Diabetes ([place]) 05/31/2012  . Hypertension 05/31/2012    Past Medical History:  Diagnosis Date  . Diabetes mellitus    takes Janumet and Invokana daily  . Hernia, umbilical   . Hypertension    takes Lisinopril daily  . Plaque psoriasis   . Seasonal allergies    takes Claritin daily and uses Flonase daily    Past Surgical History:  Procedure Laterality Date  . INSERTION OF MESH N/A 05/07/2016   Procedure: INSERTION OF MESH;  Surgeon: Erroll Luna, MD;  Location: [place];  Service: General;  Laterality: N/A;  . TYMPANOSTOMY TUBE PLACEMENT    . VENTRAL HERNIA REPAIR N/A 05/07/2016   Procedure: REPAIR VENTRAL HERNIA;  Surgeon: Erroll Luna, MD;  Location: [place] [facility];  Service: General;  Laterality: N/A;    Social History  Substance Use Topics  . Smoking status: Never Smoker  . Smokeless tobacco: Never Used  . Alcohol use Yes     Comment: occ glass of wine.2-3 per month    Family History  Problem Relation Age of Onset  . Hypertension Mother   . Diverticulosis Mother   .  Osteoporosis Mother   . Diabetes Father   . Alcohol abuse Father   . Osteoporosis Sister   . Hypertension Sister   . Diabetes Brother   . Hypertension Brother   . Hypertension Maternal Grandmother   . Heart disease Maternal Grandfather   . Diabetes Paternal Grandfather   . Diabetes Brother   . Hypertension Brother   . Heart attack Brother   . Osteoporosis Sister     Allergies  Allergen Reactions  . Penicillins Hives and Rash    Has patient had a PCN reaction causing immediate rash, facial/tongue/throat swelling, SOB or lightheadedness with hypotension: Yes Has patient had a PCN reaction causing severe rash involving mucus membranes or skin necrosis: No Has patient had a PCN reaction that required hospitalization No Has patient had a PCN reaction occurring within the last 10 years: No If all of the above answers are "NO", then may proceed with Cephalosporin use.     Medication list has been reviewed and updated.  Current Outpatient Prescriptions on File Prior to Visit  Medication Sig Dispense Refill  . aspirin EC 81 MG tablet Take 81 mg by mouth daily.    Marland Kitchen atorvastatin (LIPITOR) 10 MG tablet Take 1 tablet (10 mg total) by mouth daily. 90 tablet 1  .  B Complex-C (B-COMPLEX WITH VITAMIN C) tablet Take 1 tablet by mouth daily.    . Carbinoxamine Maleate (RYVENT) 6 MG TABS Take 1 tablet by mouth 2 (two) times daily. 60 tablet 3  . cyclobenzaprine (FLEXERIL) 5 MG tablet 1 pill by mouth up to every 8 hours as needed. Start with one pill by mouth each bedtime as needed due to sedation 15 tablet 0  . dapagliflozin propanediol (FARXIGA) 5 MG TABS tablet Take 5 mg by mouth daily. 30 tablet 5  . lisinopril (PRINIVIL,ZESTRIL) 10 MG tablet Take 1 tablet (10 mg total) by mouth daily. 90 tablet 0  . Multiple Vitamin (MULTIVITAMIN WITH MINERALS) TABS tablet Take 1 tablet by mouth daily.    . sitaGLIPtin-metformin (JANUMET) 50-1000 MG tablet Take 1 tablet by mouth 2 (two) times daily with a  meal. 60 tablet 2  . vitamin B-12 (CYANOCOBALAMIN) 1000 MCG tablet Take 1,000 mcg by mouth daily.    . vitamin C (ASCORBIC ACID) 500 MG tablet Take 500 mg by mouth daily.    [name] 93 MCG/ACT EXHU Place 2 sprays into the nose 2 (two) times daily. 32 mL 1  . lisinopril (PRINIVIL,ZESTRIL) 10 MG tablet TAKE ONE TABLET BY MOUTH DAILY 30 tablet 0  . loratadine (CLARITIN) 10 MG tablet Take 10 mg by mouth daily.     No current facility-administered medications on file prior to visit.     ROS ROS otherwise unremarkable unless listed above.  Physical Examination: BP 136/89 (BP Location: Right Arm, Patient Position: Sitting, Cuff Size: Normal)   Pulse 83   Temp 98.9 F (37.2 C) (Oral)   Resp 16   Ht 5' 2.8" (1.595 m)   Wt 182 lb 12.8 oz (82.9 kg)   SpO2 97%   BMI 32.59 kg/m  Ideal Body Weight: Weight in (lb) to have BMI = 25: 139.9  Physical Exam  Constitutional: He is oriented to person, place, and time. He appears well-developed and well-nourished. No distress.  Slightly clammy  HENT:  Head: Normocephalic and atraumatic.  Eyes: Conjunctivae and EOM are normal. Pupils are equal, round, and reactive to light.  Cardiovascular: Normal rate and regular rhythm.   Pulses:      Radial pulses are 2+ on the right side, and 2+ on the left side.  Pulmonary/Chest: Effort normal. No apnea. No respiratory distress. He has no decreased breath sounds. He has no wheezes. He has no rhonchi.  Abdominal: Soft. Bowel sounds are normal. There is generalized tenderness.  Neurological: He is alert and oriented to person, place, and time.  Skin: Skin is warm and dry. He is not diaphoretic.  Psychiatric: He has a normal mood and affect. His behavior is normal.    Results for orders placed or performed in visit on 01/01/17  POCT CBC  Result Value Ref Range   WBC 10.2 4.6 - 10.2 K/uL   Lymph, poc 2.4 0.6 - 3.4   POC LYMPH PERCENT 23.4 10 - 50 %L   MID (cbc) 0.8 0 - 0.9   POC MID % 7.9 0 - 12 %M   POC  Granulocyte 7.0 (A) 2 - 6.9   Granulocyte percent 68.7 37 - 80 %G   RBC 5.47 4.69 - 6.13 M/uL   Hemoglobin 15.9 14.1 - 18.1 g/dL   HCT, POC 47.8 43.5 - 53.7 %   MCV 87.3 80 - 97 fL   MCH, POC 29.1 27 - 31.2 pg   MCHC 33.3 31.8 - 35.4 g/dL  RDW, POC 13.9 %   Platelet Count, POC 396 142 - 424 K/uL   MPV 7.8 0 - 99.8 fL  POCT urinalysis dipstick  Result Value Ref Range   Color, UA yellow yellow   Clarity, UA clear clear   Glucose, UA =500 (A) negative mg/dL   Bilirubin, UA negative negative   Ketones, POC UA moderate (40) (A) negative mg/dL   Spec Grav, UA 1.020 1.010 - 1.025   Blood, UA negative negative   pH, UA 5.0 5.0 - 8.0   Protein Ur, POC trace (A) negative mg/dL   Urobilinogen, UA 0.2 0.2 or 1.0 E.U./dL   Nitrite, UA Negative Negative   Leukocytes, UA Negative Negative   ORTHOSTAT sitting108/78 standing114/80 prone110/70  Assessment and Plan: Logan [name] is a 46 y.o. male who is here today for cc of nausea dizziness Likely viral infection that is improving.  Advised diarrhea friendly diet.  rtc if sxs do not improve within 48-72. Vitals wnl, orthvs normal Abdominal pain, unspecified abdominal location - Plan: POCT CBC, POCT urinalysis dipstick  Low grade fever - Plan: POCT CBC, POCT urinalysis dipstick  Dizziness - Plan: POCT CBC, POCT urinalysis dipstick, Orthostatic vital signs  Ivar Drape, PA-C [facility] 5/27/20189:19 AM

## 2017-01-01 NOTE — Patient Instructions (Addendum)
This looks likely viral.  I would like you to follow this diarrhea friendly diet. Please hydrate more, with 64 oz or more of water at this time. If you are having no improvement of your symptoms in 3 days, please return  Food Choices to Help Relieve Diarrhea, Adult When you have diarrhea, the foods you eat and your eating habits are very important. Choosing the right foods and drinks can help:  Relieve diarrhea.  Replace lost fluids and nutrients.  Prevent dehydration. What general guidelines should I follow? Relieving diarrhea   Choose foods with less than 2 g or .07 oz. of fiber per serving.  Limit fats to less than 8 tsp (38 g or 1.34 oz.) a day.  Avoid the following:  Foods and beverages sweetened with high-fructose corn syrup, honey, or sugar alcohols such as xylitol, sorbitol, and mannitol.  Foods that contain a lot of fat or sugar.  Fried, greasy, or spicy foods.  High-fiber grains, breads, and cereals.  Raw fruits and vegetables.  Eat foods that are rich in probiotics. These foods include dairy products such as yogurt and fermented milk products. They help increase healthy bacteria in the stomach and intestines (gastrointestinal tract, or GI tract).  If you have lactose intolerance, avoid dairy products. These may make your diarrhea worse.  Take medicine to help stop diarrhea (antidiarrheal medicine) only as told by your health care provider. Replacing nutrients   Eat small meals or snacks every 3-4 hours.  Eat bland foods, such as white rice, toast, or baked potato, until your diarrhea starts to get better. Gradually reintroduce nutrient-rich foods as tolerated or as told by your health care provider. This includes:  Well-cooked protein foods.  Peeled, seeded, and soft-cooked fruits and vegetables.  Low-fat dairy products.  Take vitamin and mineral supplements as told by your health care provider. Preventing dehydration    Start by sipping water or a  special solution to prevent dehydration (oral rehydration solution, ORS). Urine that is clear or pale yellow means that you are getting enough fluid.  Try to drink at least 8-10 cups of fluid each day to help replace lost fluids.  You may add other liquids in addition to water, such as clear juice or decaffeinated sports drinks, as tolerated or as told by your health care provider.  Avoid drinks with caffeine, such as coffee, tea, or soft drinks.  Avoid alcohol. What foods are recommended? The items listed may not be a complete list. Talk with your health care provider about what dietary choices are best for you. Grains  White rice. White, [place], or pita breads (fresh or toasted), including plain rolls, buns, or bagels. White pasta. Saltine, soda, or graham crackers. Pretzels. Low-fiber cereal. Cooked cereals made with water (such as cornmeal, farina, or cream cereals). Plain muffins. Matzo. Melba toast. Zwieback. Vegetables  Potatoes (without the skin). Most well-cooked and canned vegetables without skins or seeds. Tender lettuce. Fruits  Apple sauce. Fruits canned in juice. Cooked apricots, cherries, grapefruit, peaches, pears, or plums. Fresh bananas and cantaloupe. Meats and other protein foods  Baked or boiled chicken. Eggs. Tofu. Fish. Seafood. Smooth nut butters. Ground or well-cooked tender beef, ham, veal, lamb, pork, or poultry. Dairy  Plain yogurt, kefir, and unsweetened liquid yogurt. Lactose-free milk, buttermilk, skim milk, or soy milk. Low-fat or nonfat hard cheese. Beverages  Water. Low-calorie sports drinks. Fruit juices without pulp. Strained tomato and vegetable juices. Decaffeinated teas. Sugar-free beverages not sweetened with sugar alcohols. Oral rehydration solutions, if  approved by your health care provider. Seasoning and other foods  Bouillon, broth, or soups made from recommended foods. What foods are not recommended? The items listed may not be a complete list.  Talk with your health care provider about what dietary choices are best for you. Grains  Whole grain, whole wheat, bran, or rye breads, rolls, pastas, and crackers. Wild or brown rice. Whole grain or bran cereals. Barley. Oats and oatmeal. Corn tortillas or taco shells. Granola. Popcorn. Vegetables  Raw vegetables. Fried vegetables. Cabbage, broccoli, Brussels sprouts, artichokes, baked beans, beet greens, corn, kale, legumes, peas, sweet potatoes, and yams. Potato skins. Cooked spinach and cabbage. Fruits  Dried fruit, including raisins and dates. Raw fruits. Stewed or dried prunes. Canned fruits with syrup. Meat and other protein foods  Fried or fatty meats. Deli meats. Chunky nut butters. Nuts and seeds. Beans and lentils. Berniece Salines. Hot dogs. Sausage. Dairy  High-fat cheeses. Whole milk, chocolate milk, and beverages made with milk, such as milk shakes. Half-and-half. Cream. sour cream. Ice cream. Beverages  Caffeinated beverages (such as coffee, tea, soda, or energy drinks). Alcoholic beverages. Fruit juices with pulp. Prune juice. Soft drinks sweetened with high-fructose corn syrup or sugar alcohols. High-calorie sports drinks. Fats and oils  Butter. Cream sauces. Margarine. Salad oils. Plain salad dressings. Olives. Avocados. Mayonnaise. Sweets and desserts  Sweet rolls, doughnuts, and sweet breads. Sugar-free desserts sweetened with sugar alcohols such as xylitol and sorbitol. Seasoning and other foods  Honey. Hot sauce. Chili powder. Gravy. Cream-based or milk-based soups. Pancakes and waffles. Summary  When you have diarrhea, the foods you eat and your eating habits are very important.  Make sure you get at least 8-10 cups of fluid each day, or enough to keep your urine clear or pale yellow.  Eat bland foods and gradually reintroduce healthy, nutrient-rich foods as tolerated, or as told by your health care provider.  Avoid high-fiber, fried, greasy, or spicy foods. This information  is not intended to replace advice given to you by your health care provider. Make sure you discuss any questions you have with your health care provider. Document Released: 10/17/2003 Document Revised: 07/24/2016 Document Reviewed: 07/24/2016 Elsevier Interactive Patient Education  2017 [employer].    IF you received an x-ray today, you will receive an invoice from [facility]. Please contact [facility] at [phone] with questions or concerns regarding your invoice.   IF you received labwork today, you will receive an invoice from [facility]. Please contact [facility] at [phone] with questions or concerns regarding your invoice.   Our billing staff will not be able to assist you with questions regarding bills from these companies.  You will be contacted with the lab results as soon as they are available. The fastest way to get your results is to activate your My Chart account. Instructions are located on the last page of this paperwork. If you have not heard from [place] regarding the results in 2 weeks, please contact this office.

## 2017-02-16 ENCOUNTER — Encounter: Payer: Self-pay | Admitting: Family Medicine

## 2017-02-16 ENCOUNTER — Ambulatory Visit (INDEPENDENT_AMBULATORY_CARE_PROVIDER_SITE_OTHER): Payer: BLUE CROSS/BLUE SHIELD | Admitting: Family Medicine

## 2017-02-16 DIAGNOSIS — E119 Type 2 diabetes mellitus without complications: Secondary | ICD-10-CM

## 2017-02-16 DIAGNOSIS — E785 Hyperlipidemia, unspecified: Secondary | ICD-10-CM

## 2017-02-16 DIAGNOSIS — I10 Essential (primary) hypertension: Secondary | ICD-10-CM

## 2017-02-16 MED ORDER — ATORVASTATIN CALCIUM 10 MG PO TABS: 10.0000 mg | ORAL_TABLET | Freq: Every day | ORAL | 1 refills | Status: DC

## 2017-02-16 MED ORDER — LISINOPRIL 10 MG PO TABS: 10.0000 mg | ORAL_TABLET | Freq: Every day | ORAL | 0 refills | Status: DC

## 2017-02-16 MED ORDER — SITAGLIPTIN PHOS-METFORMIN HCL 50-1000 MG PO TABS: 1.0000 | ORAL_TABLET | Freq: Two times a day (BID) | ORAL | 2 refills | Status: DC

## 2017-02-16 MED ORDER — DAPAGLIFLOZIN PROPANEDIOL 5 MG PO TABS: 5.0000 mg | ORAL_TABLET | Freq: Every day | ORAL | 5 refills | Status: DC

## 2017-02-16 NOTE — Progress Notes (Signed)
By signing my name below, I, Mesha Guinyard, attest that this documentation has been prepared under the direction and in the presence of [name], MD.  Electronically Signed: Verlee Monte, Medical Scribe. 02/16/17. 1:45 PM.  Subjective:    Patient ID: Logan [name], male    DOB: 1970-08-14, 46 y.o.   MRN: [id number]  HPI Chief Complaint  Patient presents with  . Medication Refill    HPI Comments: Logan [name] is a 46 y.o. male who presents to Primary Care at [facility] for follow-up. Pt is not fasting today. He suspects hernia on abdomen.  DM: At last visit he had been out of medication, except for metformin. Planned on recheck on medications. His invokana had been changed to [place] for cost reasons and continued on janumet with discount card given last visit. Recommended 6 week follow-up with I saw him in March. Pt is complaint with farxiga, and janumet. Pt suspects farxiga works fine, but he's slipped on his diet consuming more carbs and sugar while his family was in town. Pt's blood sugar has been in the lower 100, not lower than 50s. Pt saw his ophthalmologist Oct 2017, no diabetic changes found. Pt is followed by a dentist and no diabetic changes found.  Lab Results  Component Value Date   HGBA1C 10.0 (H) 11/03/2016   Lab Results  Component Value Date   MICROALBUR 1.7 07/17/2015   Wt Readings from Last 3 Encounters:  02/16/17 180 lb 3.2 oz (81.7 kg)  01/01/17 182 lb 12.8 oz (82.9 kg)  12/08/16 182 lb (82.6 kg)  Body mass index is 31.92 kg/m.  HTN: Takes lisinopril 10 mg QD. Pt is complaint with lisinopril, bp measures in the "optimal range" on the box, 130/80. Denies chest pain,lgiht-headedness, HA, vision disturbance, dizziness, or other a acute side effects. Lab Results  Component Value Date   CREATININE 0.86 06/23/2016   HLD: He was continued on lipitor 10 mg Qd, that was started on Nov. Pt is compliant with lipitor. Pt reports contracting 3-4 day virus  that caused abdominal pain, and myalgias. This has since resolved and he hasn't experienced any acute side effects with lipitor. Lab Results  Component Value Date   CHOL 210 (H) 06/23/2016   HDL 40 (L) 06/23/2016   LDLCALC 146 (H) 06/23/2016   TRIG 121 06/23/2016   CHOLHDL 5.3 (H) 06/23/2016   Lab Results  Component Value Date   ALT 53 (H) 06/23/2016   AST 27 06/23/2016   ALKPHOS 92 06/23/2016   BILITOT 0.4 06/23/2016    Patient Active Problem List   Diagnosis Date Noted  . Allergic rhinitis with a nonallergic component 12/08/2016  . Psoriasis 12/08/2016  . Diabetes ([place]) 05/31/2012  . Hypertension 05/31/2012   Past Medical History:  Diagnosis Date  . Diabetes mellitus    takes Janumet and Invokana daily  . Hernia, umbilical   . Hypertension    takes Lisinopril daily  . Plaque psoriasis   . Seasonal allergies    takes Claritin daily and uses Flonase daily   Past Surgical History:  Procedure Laterality Date  . INSERTION OF MESH N/A 05/07/2016   Procedure: INSERTION OF MESH;  Surgeon: Erroll Luna, MD;  Location: [place];  Service: General;  Laterality: N/A;  . TYMPANOSTOMY TUBE PLACEMENT    . VENTRAL HERNIA REPAIR N/A 05/07/2016   Procedure: REPAIR VENTRAL HERNIA;  Surgeon: Erroll Luna, MD;  Location: [place];  Service: General;  Laterality: N/A;   Allergies  Allergen  Reactions  . Penicillins Hives and Rash    Has patient had a PCN reaction causing immediate rash, facial/tongue/throat swelling, SOB or lightheadedness with hypotension: Yes Has patient had a PCN reaction causing severe rash involving mucus membranes or skin necrosis: No Has patient had a PCN reaction that required hospitalization No Has patient had a PCN reaction occurring within the last 10 years: No If all of the above answers are "NO", then may proceed with Cephalosporin use.    Prior to Admission medications   Medication Sig Start Date End Date Taking? Authorizing Provider  aspirin EC 81 MG  tablet Take 81 mg by mouth daily.   Yes [provider]  atorvastatin (LIPITOR) 10 MG tablet Take 1 tablet (10 mg total) by mouth daily. 11/03/16  Yes [name], MD  B Complex-C (B-COMPLEX WITH VITAMIN C) tablet Take 1 tablet by mouth daily.   Yes [provider]  Carbinoxamine Maleate (RYVENT) 6 MG TABS Take 1 tablet by mouth 2 (two) times daily. 12/08/16  Yes Bobbitt, Sed[place] Muta, MD  cyclobenzaprine (FLEXERIL) 5 MG tablet 1 pill by mouth up to every 8 hours as needed. Start with one pill by mouth each bedtime as needed due to sedation 05/04/16  Yes [name], MD  dapagliflozin propanediol (FARXIGA) 5 MG TABS tablet Take 5 mg by mouth daily. 11/03/16  Yes [name], MD  lisinopril (PRINIVIL,ZESTRIL) 10 MG tablet Take 1 tablet (10 mg total) by mouth daily. 11/03/16  Yes [name], MD  Multiple Vitamin (MULTIVITAMIN WITH MINERALS) TABS tablet Take 1 tablet by mouth daily.   Yes [provider]  sitaGLIPtin-metformin (JANUMET) 50-1000 MG tablet Take 1 tablet by mouth 2 (two) times daily with a meal. 11/03/16  Yes [name], MD  vitamin B-12 (CYANOCOBALAMIN) 1000 MCG tablet Take 1,000 mcg by mouth daily.   Yes [provider]  vitamin C (ASCORBIC ACID) 500 MG tablet Take 500 mg by mouth daily.   Yes [provider]  [name] 93 MCG/ACT EXHU Place 2 sprays into the nose 2 (two) times daily. 12/08/16  Yes Bobbitt, [place] Muta, MD  loratadine (CLARITIN) 10 MG tablet Take 10 mg by mouth daily.    [provider]   Social History   Social History  . Marital status: Single    Spouse name: N/A  . Number of children: N/A  . Years of education: N/A   Occupational History  . Not on file.   Social History Main Topics  . Smoking status: Never Smoker  . Smokeless tobacco: Never Used  . Alcohol use Yes     Comment: occ glass of wine.2-3 per month  . Drug use: No  . Sexual activity: No   Other Topics Concern  . Not  on file   Social History Narrative  . No narrative on file   Review of Systems  Constitutional: Negative for fatigue and unexpected weight change.  Eyes: Negative for visual disturbance.  Respiratory: Negative for cough, chest tightness and shortness of breath.   Cardiovascular: Negative for chest pain, palpitations and leg swelling.  Gastrointestinal: Negative for abdominal pain and blood in stool.  Neurological: Negative for dizziness, light-headedness and headaches.   Objective:  Physical Exam  Constitutional: He is oriented to person, place, and time. He appears well-developed and well-nourished.  HENT:  Head: Normocephalic and atraumatic.  Eyes: EOM are normal. Pupils are equal, round, and reactive to light.  Neck: No JVD present. Carotid bruit  is not present.  Cardiovascular: Normal rate, regular rhythm and normal heart sounds.  Exam reveals no gallop and no friction rub.   No murmur heard. Pulmonary/Chest: Effort normal and breath sounds normal. No respiratory distress. He has no wheezes. He has no rales.  Musculoskeletal: He exhibits no edema.  Neurological: He is alert and oriented to person, place, and time.  Skin: Skin is warm and dry.  Well healed scar at the umbilicus  Psychiatric: He has a normal mood and affect.  Vitals reviewed.  Rectus diastasis midline abdomen without apparent focal hernia Vitals:   02/16/17 1337  BP: 121/76  Pulse: (!) 103  Resp: 16  Temp: 98.3 F (36.8 C)  TempSrc: Oral  SpO2: 95%  Weight: 180 lb 3.2 oz (81.7 kg)  Height: 5\' 3"  (1.6 m)   Body mass index is 31.92 kg/m. Assessment & Plan:   Logan [name] is a 46 y.o. male Type 2 diabetes mellitus without complication, without long-term current use of insulin ([place]) - Plan: Hemoglobin A1c, sitaGLIPtin-metformin (JANUMET) 50-1000 MG tablet, dapagliflozin propanediol (FARXIGA) 5 MG TABS tablet, Microalbumin, urine  - Previously uncontrolled off of medication, now back on meds past 3  months. Does admit to decline and diet control, so if A1c is close may try to remain on same medications.  -return for fasting labs in the next few days as not fasting today.  Essential hypertension - Plan: Comprehensive metabolic panel, lisinopril (PRINIVIL,ZESTRIL) 10 MG tablet  - Controlled, tolerating current medication, refilled lisinopril, labs pending on fasting lab visit  Hyperlipidemia, unspecified hyperlipidemia type - Plan: Comprehensive metabolic panel, Lipid panel, atorvastatin (LIPITOR) 10 MG tablet  - Tolerating Lipitor, repeat CMP, lipid panel on medication. Plans on returning for fasting visit.  Meds ordered this encounter  Medications  . sitaGLIPtin-metformin (JANUMET) 50-1000 MG tablet    Sig: Take 1 tablet by mouth 2 (two) times daily with a meal.    Dispense:  60 tablet    Refill:  2  . lisinopril (PRINIVIL,ZESTRIL) 10 MG tablet    Sig: Take 1 tablet (10 mg total) by mouth daily.    Dispense:  90 tablet    Refill:  0  . atorvastatin (LIPITOR) 10 MG tablet    Sig: Take 1 tablet (10 mg total) by mouth daily.    Dispense:  90 tablet    Refill:  1  . dapagliflozin propanediol (FARXIGA) 5 MG TABS tablet    Sig: Take 5 mg by mouth daily.    Dispense:  30 tablet    Refill:  5   Patient Instructions   Work on diet, but I will check A1c on blood work tomorrow morning or next day - let them know you are here for fasting lab work only.  If A1c is close, can continue same medications for now. You should receive results about your blood work within the next 2 weeks.  Follow-up in 3 months.  IF you received an x-ray today, you will receive an invoice from [facility]. Please contact [facility] at [phone] with questions or concerns regarding your invoice.   IF you received labwork today, you will receive an invoice from [place]. Please contact [facility] at [phone] with questions or concerns regarding your invoice.   Our billing staff will not  be able to assist you with questions regarding bills from these companies.  You will be contacted with the lab results as soon as they are available. The fastest way to get your results is  to activate your My Chart account. Instructions are located on the last page of this paperwork. If you have not heard from [place] regarding the results in 2 weeks, please contact this office.       I personally performed the services described in this documentation, which was scribed in my presence. The recorded information has been reviewed and considered for accuracy and completeness, addended by me as needed, and agree with information above.  Signed,   [name], MD Primary [facility] at [place] [facility].  02/16/17 2:14 PM

## 2017-02-16 NOTE — Patient Instructions (Addendum)
Work on diet, but I will check A1c on blood work tomorrow morning or next day - let them know you are here for fasting lab work only.  If A1c is close, can continue same medications for now. You should receive results about your blood work within the next 2 weeks.  Follow-up in 3 months.  IF you received an x-ray today, you will receive an invoice from [facility]. Please contact [facility] at [phone] with questions or concerns regarding your invoice.   IF you received labwork today, you will receive an invoice from [place]. Please contact [facility] at [phone] with questions or concerns regarding your invoice.   Our billing staff will not be able to assist you with questions regarding bills from these companies.  You will be contacted with the lab results as soon as they are available. The fastest way to get your results is to activate your My Chart account. Instructions are located on the last page of this paperwork. If you have not heard from [place] regarding the results in 2 weeks, please contact this office.

## 2017-02-17 LAB — MICROALBUMIN, URINE: Microalbumin, Urine: 4.6 ug/mL

## 2017-02-18 ENCOUNTER — Other Ambulatory Visit: Payer: BLUE CROSS/BLUE SHIELD | Admitting: Emergency Medicine

## 2017-02-18 DIAGNOSIS — E119 Type 2 diabetes mellitus without complications: Secondary | ICD-10-CM

## 2017-02-18 DIAGNOSIS — I10 Essential (primary) hypertension: Secondary | ICD-10-CM

## 2017-02-18 DIAGNOSIS — E785 Hyperlipidemia, unspecified: Secondary | ICD-10-CM

## 2017-02-19 LAB — COMPREHENSIVE METABOLIC PANEL
ALT: 52 IU/L — ABNORMAL HIGH (ref 0–44)
AST: 33 IU/L (ref 0–40)
Albumin/Globulin Ratio: 1.7 (ref 1.2–2.2)
Albumin: 4.9 g/dL (ref 3.5–5.5)
Alkaline Phosphatase: 93 IU/L (ref 39–117)
BUN/Creatinine Ratio: 17 (ref 9–20)
BUN: 16 mg/dL (ref 6–24)
Bilirubin Total: 0.4 mg/dL (ref 0.0–1.2)
CO2: 18 mmol/L — ABNORMAL LOW (ref 20–29)
Calcium: 9.8 mg/dL (ref 8.7–10.2)
Chloride: 105 mmol/L (ref 96–106)
Creatinine, Ser: 0.92 mg/dL (ref 0.76–1.27)
GFR calc Af Amer: 113 mL/min/{1.73_m2} (ref >59)
GFR calc non Af Amer: 98 mL/min/{1.73_m2} (ref >59)
Globulin, Total: 2.9 g/dL (ref 1.5–4.5)
Glucose: 116 mg/dL — ABNORMAL HIGH (ref 65–99)
Potassium: 5.6 mmol/L — ABNORMAL HIGH (ref 3.5–5.2)
Sodium: 140 mmol/L (ref 134–144)
Total Protein: 7.8 g/dL (ref 6.0–8.5)

## 2017-02-19 LAB — LIPID PANEL
Chol/HDL Ratio: 4.1 ratio (ref 0.0–5.0)
Cholesterol, Total: 163 mg/dL (ref 100–199)
HDL: 40 mg/dL (ref >39)
LDL Calculated: 107 mg/dL — ABNORMAL HIGH (ref 0–99)
Triglycerides: 78 mg/dL (ref 0–149)
VLDL Cholesterol Cal: 16 mg/dL (ref 5–40)

## 2017-02-19 LAB — HEMOGLOBIN A1C
Est. average glucose Bld gHb Est-mCnc: 203 mg/dL
Hgb A1c MFr Bld: 8.7 % — ABNORMAL HIGH (ref 4.8–5.6)

## 2017-02-24 ENCOUNTER — Other Ambulatory Visit: Payer: Self-pay | Admitting: Family Medicine

## 2017-02-24 ENCOUNTER — Encounter: Payer: Self-pay | Admitting: Radiology

## 2017-02-24 DIAGNOSIS — E875 Hyperkalemia: Secondary | ICD-10-CM

## 2017-02-24 DIAGNOSIS — E1165 Type 2 diabetes mellitus with hyperglycemia: Secondary | ICD-10-CM

## 2017-02-24 DIAGNOSIS — E119 Type 2 diabetes mellitus without complications: Secondary | ICD-10-CM

## 2017-02-24 MED ORDER — DAPAGLIFLOZIN PROPANEDIOL 10 MG PO TABS: 10.0000 mg | ORAL_TABLET | Freq: Every day | ORAL | 1 refills | Status: DC

## 2017-02-24 NOTE — Progress Notes (Signed)
See lab notes

## 2017-03-02 ENCOUNTER — Other Ambulatory Visit: Payer: Self-pay

## 2017-03-02 DIAGNOSIS — J3089 Other allergic rhinitis: Secondary | ICD-10-CM

## 2017-03-02 MED ORDER — XHANCE 93 MCG/ACT NA EXHU: 2.0000 | INHALANT_SUSPENSION | Freq: Two times a day (BID) | NASAL | 1 refills | Status: DC

## 2017-05-16 ENCOUNTER — Other Ambulatory Visit: Payer: Self-pay | Admitting: Family Medicine

## 2017-05-16 DIAGNOSIS — I10 Essential (primary) hypertension: Secondary | ICD-10-CM

## 2017-06-07 ENCOUNTER — Encounter: Payer: Self-pay | Admitting: Family Medicine

## 2017-06-07 ENCOUNTER — Telehealth: Payer: Self-pay | Admitting: Family Medicine

## 2017-06-07 ENCOUNTER — Ambulatory Visit (INDEPENDENT_AMBULATORY_CARE_PROVIDER_SITE_OTHER): Payer: BLUE CROSS/BLUE SHIELD | Admitting: Family Medicine

## 2017-06-07 VITALS — BP 128/72 | HR 116 | Temp 97.9°F | Resp 18 | Ht 63.0 in | Wt 179.6 lb

## 2017-06-07 DIAGNOSIS — M545 Low back pain, unspecified: Secondary | ICD-10-CM

## 2017-06-07 DIAGNOSIS — E119 Type 2 diabetes mellitus without complications: Secondary | ICD-10-CM

## 2017-06-07 DIAGNOSIS — Z23 Encounter for immunization: Secondary | ICD-10-CM | POA: Diagnosis not present

## 2017-06-07 DIAGNOSIS — E875 Hyperkalemia: Secondary | ICD-10-CM

## 2017-06-07 DIAGNOSIS — E785 Hyperlipidemia, unspecified: Secondary | ICD-10-CM | POA: Diagnosis not present

## 2017-06-07 DIAGNOSIS — R000 Tachycardia, unspecified: Secondary | ICD-10-CM

## 2017-06-07 DIAGNOSIS — I10 Essential (primary) hypertension: Secondary | ICD-10-CM | POA: Diagnosis not present

## 2017-06-07 MED ORDER — SITAGLIPTIN PHOS-METFORMIN HCL 50-1000 MG PO TABS: 1.0000 | ORAL_TABLET | Freq: Two times a day (BID) | ORAL | 1 refills | Status: DC

## 2017-06-07 MED ORDER — LISINOPRIL 10 MG PO TABS: 10.0000 mg | ORAL_TABLET | Freq: Every day | ORAL | 1 refills | Status: DC

## 2017-06-07 MED ORDER — ATORVASTATIN CALCIUM 10 MG PO TABS: 10.0000 mg | ORAL_TABLET | Freq: Every day | ORAL | 1 refills | Status: DC

## 2017-06-07 MED ORDER — DAPAGLIFLOZIN PROPANEDIOL 10 MG PO TABS: 10.0000 mg | ORAL_TABLET | Freq: Every day | ORAL | 1 refills | Status: DC

## 2017-06-07 NOTE — Patient Instructions (Addendum)
No change in medications for now. Walking or low intensity exercise most to all days per week can continue to help weight loss as well as diabetes control.  Tylenol is ok for episodic back pain, but please follow-up in the next few weeks to discuss back pain further along with possible x-rays at that time. We can also discuss your hand symptoms further and decide whether or not you need to meet back with orthopedics as well. Return to the clinic or go to the nearest emergency room if any of your symptoms worsen or new symptoms occur.  IF you received an x-ray today, you will receive an invoice from [facility]. Please contact [facility] at [phone] with questions or concerns regarding your invoice.   IF you received labwork today, you will receive an invoice from [place]. Please contact [facility] at [phone] with questions or concerns regarding your invoice.   Our billing staff will not be able to assist you with questions regarding bills from these companies.  You will be contacted with the lab results as soon as they are available. The fastest way to get your results is to activate your My Chart account. Instructions are located on the last page of this paperwork. If you have not heard from [place] regarding the results in 2 weeks, please contact this office.

## 2017-06-07 NOTE — Telephone Encounter (Signed)
Pt dropped off ins form to be completed by Dr Carlota Raspberry  Pt will pick up form  Best phone for pt is [phone]

## 2017-06-07 NOTE — Progress Notes (Signed)
Subjective:  By signing my name below, I, Logan [name], attest that this documentation has been prepared under the direction and in the presence of Logan Agreste, MD Electronically Signed: [name], ED Scribe 06/07/2017 at 9:24 AM.   Patient ID: Logan [name], male    DOB: 1971-03-15, 46 y.o.   MRN: [id number]  Chief Complaint  Patient presents with  . Medication Refill    all meds   . blood work   HPI Logan [name] is a 46 y.o. male who presents to Primary Care at [facility] for follow-up. Pt is fasting at this visit.  DM Lab Results  Component Value Date   HGBA1C 8.7 (H) 02/18/2017   Lab Results  Component Value Date   MICROALBUR 1.7 07/17/2015  Followed by dentist and optho with visit in October 2018. We increased Farxiga to 10 mg each day. Continued Janumet 50-1000 mg bid. Also planned on improving diet and exercise. Pt has noticed some urinary frequency since increasing the dose however he is getting used to it. He has been walking a few days/week for exercise and has been thinking about joining a gym. Pt reports morning fasting sugars of 125-130, mid day of ~150 and 250+ right after eating.  Wt Readings from Last 3 Encounters:  06/07/17 179 lb 9.6 oz (81.5 kg)  02/16/17 180 lb 3.2 oz (81.7 kg)  01/01/17 182 lb 12.8 oz (82.9 kg)   Hyperlipidemia Lab Results  Component Value Date   CHOL 163 02/18/2017   HDL 40 02/18/2017   LDLCALC 107 (H) 02/18/2017   TRIG 78 02/18/2017   CHOLHDL 4.1 02/18/2017   Lab Results  Component Value Date   ALT 52 (H) 02/18/2017   AST 33 02/18/2017   ALKPHOS 93 02/18/2017   BILITOT 0.4 02/18/2017  Continued Lipitor 10 mg qd with some improvement in lipids last visit. Denies abdominal pain, blood in stools, any side-effects.   HTN On Lisinopril 10 mg qd. Pt does not check his BP outside of the office. Suspects his HR is elevated today since he was rushing trying to find screening paperwork PTA for work. Denies cp, heart  palpitations, sob, light-headedness, dizziness.  Lab Results  Component Value Date   CREATININE 0.92 02/18/2017   Hyperkalemia Potassium was 5.6 at 7/12 visit. BMP was entered for lab only visit but did not return to gave this drawn.   Back Pain Pt reports a h/o episodic low back pain towards the end of visit.  States pain is exacerbated with jogging. Also mentions h/o numbness/tingling in hands that he describes as "electricity". Denies weakness in LE, radiating pain in LE, saddle paraesthesia. States he has been briefly evaluated tingling in hands in the past and was told this was carpal tunnel. Pt has been given braces that he uses at times. Plans to return for further evaluation of symptoms.   Patient Active Problem List   Diagnosis Date Noted  . Allergic rhinitis with a nonallergic component 12/08/2016  . Psoriasis 12/08/2016  . Diabetes ([place]) 05/31/2012  . Hypertension 05/31/2012   Past Medical History:  Diagnosis Date  . Diabetes mellitus    takes Janumet and Invokana daily  . Hernia, umbilical   . Hypertension    takes Lisinopril daily  . Plaque psoriasis   . Seasonal allergies    takes Claritin daily and uses Flonase daily   Past Surgical History:  Procedure Laterality Date  . INSERTION OF MESH N/A 05/07/2016   Procedure: INSERTION OF MESH;  Surgeon: Erroll Luna, MD;  Location: [facility];  Service: General;  Laterality: N/A;  . TYMPANOSTOMY TUBE PLACEMENT    . VENTRAL HERNIA REPAIR N/A 05/07/2016   Procedure: REPAIR VENTRAL HERNIA;  Surgeon: Erroll Luna, MD;  Location: [facility];  Service: General;  Laterality: N/A;   Allergies  Allergen Reactions  . Penicillins Hives and Rash    Has patient had a PCN reaction causing immediate rash, facial/tongue/throat swelling, SOB or lightheadedness with hypotension: Yes Has patient had a PCN reaction causing severe rash involving mucus membranes or skin necrosis: No Has patient had a PCN reaction that required hospitalization  No Has patient had a PCN reaction occurring within the last 10 years: No If all of the above answers are "NO", then may proceed with Cephalosporin use.    Prior to Admission medications   Medication Sig Start Date End Date Taking? Authorizing Provider  aspirin EC 81 MG tablet Take 81 mg by mouth daily.    [provider]  atorvastatin (LIPITOR) 10 MG tablet Take 1 tablet (10 mg total) by mouth daily. 02/16/17   Logan Agreste, MD  B Complex-C (B-COMPLEX WITH VITAMIN C) tablet Take 1 tablet by mouth daily.    [provider]  Carbinoxamine Maleate (RYVENT) 6 MG TABS Take 1 tablet by mouth 2 (two) times daily. 12/08/16   Bobbitt, Sedalia Muta, MD  cyclobenzaprine (FLEXERIL) 5 MG tablet 1 pill by mouth up to every 8 hours as needed. Start with one pill by mouth each bedtime as needed due to sedation 05/04/16   Logan Agreste, MD  dapagliflozin propanediol (FARXIGA) 10 MG TABS tablet Take 10 mg by mouth daily. 02/24/17   Logan Agreste, MD  lisinopril (PRINIVIL,ZESTRIL) 10 MG tablet TAKE 1 TABLET BY MOUTH EVERY DAY 05/17/17   Logan Agreste, MD  loratadine (CLARITIN) 10 MG tablet Take 10 mg by mouth daily.    [provider]  Multiple Vitamin (MULTIVITAMIN WITH MINERALS) TABS tablet Take 1 tablet by mouth daily.    [provider]  sitaGLIPtin-metformin (JANUMET) 50-1000 MG tablet Take 1 tablet by mouth 2 (two) times daily with a meal. 02/16/17   Logan Agreste, MD  vitamin B-12 (CYANOCOBALAMIN) 1000 MCG tablet Take 1,000 mcg by mouth daily.    [provider]  vitamin C (ASCORBIC ACID) 500 MG tablet Take 500 mg by mouth daily.    [provider]  [name] 93 MCG/ACT EXHU Place 2 sprays into the nose 2 (two) times daily. 03/02/17   Bobbitt, Sedalia Muta, MD   Social History   Social History  . Marital status: Single    Spouse name: N/A  . Number of children: N/A  . Years of education: N/A   Occupational History  . Not on file.    Social History Main Topics  . Smoking status: Never Smoker  . Smokeless tobacco: Never Used  . Alcohol use Yes     Comment: occ glass of wine.2-3 per month  . Drug use: No  . Sexual activity: No   Other Topics Concern  . Not on file   Social History Narrative  . No narrative on file   Review of Systems  Constitutional: Negative for fatigue and unexpected weight change.  Eyes: Negative for visual disturbance.  Respiratory: Negative for cough, chest tightness and shortness of breath.   Cardiovascular: Negative for chest pain, palpitations and leg swelling.  Gastrointestinal: Negative for abdominal pain and blood in stool.  Musculoskeletal: Positive for  back pain.  Neurological: Negative for dizziness, weakness, light-headedness and headaches.      Objective:   Physical Exam  Constitutional: He is oriented to person, place, and time. He appears well-developed and well-nourished.  HENT:  Head: Normocephalic and atraumatic.  Eyes: Pupils are equal, round, and reactive to light. EOM are normal.  Neck: No JVD present. Carotid bruit is not present.  Cardiovascular: Normal rate, regular rhythm and normal heart sounds.   No murmur heard. Mildly tachycardic  Pulmonary/Chest: Effort normal and breath sounds normal. He has no rales.  Musculoskeletal: He exhibits no edema.  Neurological: He is alert and oriented to person, place, and time.  Skin: Skin is warm and dry.  Psychiatric: He has a normal mood and affect.  Vitals reviewed.  Vitals:   06/07/17 0911  BP: 128/72  Pulse: (!) 116  Resp: 18  Temp: 97.9 F (36.6 C)  TempSrc: Oral  SpO2: 96%  Weight: 179 lb 9.6 oz (81.5 kg)  Height: 5\' 3"  (1.6 m)   Diabetic Foot Exam - Simple   Simple Foot Form Visual Inspection No deformities, no ulcerations, no other skin breakdown bilaterally:  Yes Sensation Testing Intact to touch and monofilament testing bilaterally:  Yes Pulse Check Posterior Tibialis and Dorsalis pulse intact  bilaterally:  Yes Comments       Assessment & Plan:   TYLLER BOWLBY is a 46 y.o. male Type 2 diabetes mellitus without complication, without long-term current use of insulin ([place]) - Plan: Hemoglobin A1c  - Check A1c, continue same dose of Janumet, Farxiga for now.   - Increase activity/walking for exercise.  Need for influenza vaccination - Plan: Flu Vaccine QUAD 36+ mos IM  Essential hypertension - Plan: Comprehensive metabolic panel, lisinopril (PRINIVIL,ZESTRIL) 10 MG tablet  - Stable with goal less than 130/80, Check CMP, continue lisinopril 10 mg daily.  Hyperlipidemia, unspecified hyperlipidemia type - Plan: Lipid panel, Comprehensive metabolic panel, atorvastatin (LIPITOR) 10 MG tablet  -Tolerating Lipitor, check labs, continue same dose  Hyperkalemia - Plan: Comprehensive metabolic panel  -Check CMP  Tachycardia - Plan: TSH, CBC  -Asymptomatic, check labs, consider EKG if persistent, RTC precautions if symptomatic  Low back pain without sciatica, unspecified back pain laterality, unspecified chronicity  -Long-standing low back pain by report, with possible degenerative changes previously. Avoid heavy lifting, and plans to follow-up in the next few weeks to discuss further with possible imaging at that time. Can also discuss his carpal tunnel symptoms further at that time  Meds ordered this encounter  Medications  . fluticasone (FLONASE) 50 MCG/ACT nasal spray    Sig: INHALE 2 SPRAYS INTO BOTH NOSTRILS DAILY    Refill:  5  . atorvastatin (LIPITOR) 10 MG tablet    Sig: Take 1 tablet (10 mg total) by mouth daily.    Dispense:  90 tablet    Refill:  1  . dapagliflozin propanediol (FARXIGA) 10 MG TABS tablet    Sig: Take 10 mg by mouth daily.    Dispense:  90 tablet    Refill:  1  . lisinopril (PRINIVIL,ZESTRIL) 10 MG tablet    Sig: Take 1 tablet (10 mg total) by mouth daily.    Dispense:  90 tablet    Refill:  1   Patient Instructions   No change in medications  for now. Walking or low intensity exercise most to all days per week can continue to help weight loss as well as diabetes control.  Tylenol is ok for episodic back  pain, but please follow-up in the next few weeks to discuss back pain further along with possible x-rays at that time. We can also discuss your hand symptoms further and decide whether or not you need to meet back with orthopedics as well. Return to the clinic or go to the nearest emergency room if any of your symptoms worsen or new symptoms occur.  IF you received an x-ray today, you will receive an invoice from [facility]. Please contact [facility] Rad[facility] at [phone] with questions or concerns regarding your invoice.   IF you received labwork today, you will receive an invoice from [place]. Please contact [facility] at [phone] with questions or concerns regarding your invoice.   Our billing staff will not be able to assist you with questions regarding bills from these companies.  You will be contacted with the lab results as soon as they are available. The fastest way to get your results is to activate your My Chart account. Instructions are located on the last page of this paperwork. If you have not heard from [place] regarding the results in 2 weeks, please contact this office.      I personally performed the services described in this documentation, which was scribed in my presence. The recorded information has been reviewed and considered for accuracy and completeness, addended by me as needed, and agree with information above.  Signed,   [name], MD Primary Care at [place].  06/07/17 9:51 AM

## 2017-06-08 LAB — COMPREHENSIVE METABOLIC PANEL
ALT: 56 IU/L — ABNORMAL HIGH (ref 0–44)
AST: 33 IU/L (ref 0–40)
Albumin/Globulin Ratio: 1.8 (ref 1.2–2.2)
Albumin: 4.9 g/dL (ref 3.5–5.5)
Alkaline Phosphatase: 104 IU/L (ref 39–117)
BUN/Creatinine Ratio: 18 (ref 9–20)
BUN: 16 mg/dL (ref 6–24)
Bilirubin Total: 0.6 mg/dL (ref 0.0–1.2)
CO2: 18 mmol/L — ABNORMAL LOW (ref 20–29)
Calcium: 9.4 mg/dL (ref 8.7–10.2)
Chloride: 99 mmol/L (ref 96–106)
Creatinine, Ser: 0.9 mg/dL (ref 0.76–1.27)
GFR calc Af Amer: 116 mL/min/{1.73_m2} (ref >59)
GFR calc non Af Amer: 101 mL/min/{1.73_m2} (ref >59)
Globulin, Total: 2.8 g/dL (ref 1.5–4.5)
Glucose: 126 mg/dL — ABNORMAL HIGH (ref 65–99)
Potassium: 5.1 mmol/L (ref 3.5–5.2)
Sodium: 138 mmol/L (ref 134–144)
Total Protein: 7.7 g/dL (ref 6.0–8.5)

## 2017-06-08 LAB — TSH: TSH: 1.52 u[IU]/mL (ref 0.450–4.500)

## 2017-06-08 LAB — LIPID PANEL
Chol/HDL Ratio: 5.3 ratio — ABNORMAL HIGH (ref 0.0–5.0)
Cholesterol, Total: 187 mg/dL (ref 100–199)
HDL: 35 mg/dL — ABNORMAL LOW (ref >39)
LDL Calculated: 134 mg/dL — ABNORMAL HIGH (ref 0–99)
Triglycerides: 88 mg/dL (ref 0–149)
VLDL Cholesterol Cal: 18 mg/dL (ref 5–40)

## 2017-06-08 LAB — CBC
Hematocrit: 52.1 % — ABNORMAL HIGH (ref 37.5–51.0)
Hemoglobin: 17.7 g/dL (ref 13.0–17.7)
MCH: 29.1 pg (ref 26.6–33.0)
MCHC: 34 g/dL (ref 31.5–35.7)
MCV: 86 fL (ref 79–97)
Platelets: 467 10*3/uL — ABNORMAL HIGH (ref 150–379)
RBC: 6.08 x10E6/uL — ABNORMAL HIGH (ref 4.14–5.80)
RDW: 14.3 % (ref 12.3–15.4)
WBC: 10.1 10*3/uL (ref 3.4–10.8)

## 2017-06-08 LAB — HEMOGLOBIN A1C
Est. average glucose Bld gHb Est-mCnc: 217 mg/dL
Hgb A1c MFr Bld: 9.2 % — ABNORMAL HIGH (ref 4.8–5.6)

## 2017-06-09 NOTE — Telephone Encounter (Signed)
Pt is checking on status of this message. He would like [place] to fax this form to the # given and a CB to him when this is complete. CB#[phone]

## 2017-06-10 NOTE — Telephone Encounter (Signed)
Called pt and advised that we have faxed form and that copy would be scanned into patient chart.

## 2017-06-10 NOTE — Telephone Encounter (Signed)
Done. Up front at TL desk.

## 2017-06-10 NOTE — Telephone Encounter (Signed)
Do you know the status of this

## 2017-06-13 ENCOUNTER — Other Ambulatory Visit: Payer: Self-pay | Admitting: Family Medicine

## 2017-06-13 DIAGNOSIS — E785 Hyperlipidemia, unspecified: Secondary | ICD-10-CM

## 2017-06-13 MED ORDER — ATORVASTATIN CALCIUM 20 MG PO TABS: 20.0000 mg | ORAL_TABLET | Freq: Every day | ORAL | 1 refills | Status: DC

## 2017-06-13 NOTE — Progress Notes (Signed)
See lab notes

## 2017-06-14 ENCOUNTER — Encounter: Payer: Self-pay | Admitting: *Deleted

## 2017-06-14 NOTE — Progress Notes (Signed)
Letter sent.

## 2017-06-19 ENCOUNTER — Other Ambulatory Visit: Payer: Self-pay | Admitting: Allergy and Immunology

## 2017-06-19 DIAGNOSIS — J3089 Other allergic rhinitis: Secondary | ICD-10-CM

## 2017-06-28 ENCOUNTER — Telehealth: Payer: Self-pay | Admitting: *Deleted

## 2017-06-28 DIAGNOSIS — J3089 Other allergic rhinitis: Secondary | ICD-10-CM

## 2017-06-28 NOTE — Telephone Encounter (Signed)
[facility] faxed stating they cannot get Ryvent ordered. Need to get new pharmacy to send rx in.

## 2017-06-28 NOTE — Telephone Encounter (Signed)
Left voicemail to call us back

## 2017-06-29 MED ORDER — CARBINOXAMINE MALEATE 6 MG PO TABS: 1.0000 | ORAL_TABLET | Freq: Two times a day (BID) | ORAL | 0 refills | Status: DC

## 2017-06-29 NOTE — Telephone Encounter (Signed)
I spoke with patient today. He states that he is open to other suggestions if we can not get the Ryvent. I did explain that we just needed to use a different pharmacy, but he was adamant that I send you a message for an alternative. Please advise.

## 2017-06-29 NOTE — Telephone Encounter (Signed)
Patient has been informed of need for different pharmacy and I have sent it in. I did also advise him of the need for an OV, which he was supposed to schedule today. I have also left a voicemail for the Ryvent rep with the pharmacy info included.

## 2017-06-29 NOTE — Telephone Encounter (Signed)
If that pharmacy will not supply Ryvent, call in generic carbinoxamine 4 mg every 8 hours as needed. He may have to pay a lot more for it though (Ryvent will be $10/month). Call the Ryvent rep to get her on that pharmacy. Thanks.

## 2017-07-05 ENCOUNTER — Ambulatory Visit (INDEPENDENT_AMBULATORY_CARE_PROVIDER_SITE_OTHER): Payer: BLUE CROSS/BLUE SHIELD

## 2017-07-05 ENCOUNTER — Ambulatory Visit (INDEPENDENT_AMBULATORY_CARE_PROVIDER_SITE_OTHER): Payer: BLUE CROSS/BLUE SHIELD | Admitting: Family Medicine

## 2017-07-05 ENCOUNTER — Encounter: Payer: Self-pay | Admitting: Family Medicine

## 2017-07-05 DIAGNOSIS — R05 Cough: Secondary | ICD-10-CM

## 2017-07-05 DIAGNOSIS — J019 Acute sinusitis, unspecified: Secondary | ICD-10-CM | POA: Diagnosis not present

## 2017-07-05 DIAGNOSIS — R059 Cough, unspecified: Secondary | ICD-10-CM

## 2017-07-05 DIAGNOSIS — R0989 Other specified symptoms and signs involving the circulatory and respiratory systems: Secondary | ICD-10-CM

## 2017-07-05 MED ORDER — FLUTICASONE PROPIONATE 50 MCG/ACT NA SUSP: 2.0000 | Freq: Every day | NASAL | 6 refills | Status: DC

## 2017-07-05 MED ORDER — ALBUTEROL SULFATE HFA 108 (90 BASE) MCG/ACT IN AERS: 2.0000 | INHALATION_SPRAY | RESPIRATORY_TRACT | 1 refills | Status: DC | PRN

## 2017-07-05 MED ORDER — ALBUTEROL SULFATE (2.5 MG/3ML) 0.083% IN NEBU
2.5000 mg | INHALATION_SOLUTION | Freq: Once | RESPIRATORY_TRACT | Status: AC
  Administered 2017-07-05: 2.5 mg via RESPIRATORY_TRACT

## 2017-07-05 MED ORDER — BENZONATATE 100 MG PO CAPS: 100.0000 mg | ORAL_CAPSULE | Freq: Three times a day (TID) | ORAL | 0 refills | Status: DC | PRN

## 2017-07-05 MED ORDER — CEFDINIR 300 MG PO CAPS: 600.0000 mg | ORAL_CAPSULE | Freq: Every day | ORAL | 0 refills | Status: DC

## 2017-07-05 NOTE — Progress Notes (Signed)
Patient ID: Logan [name], male    DOB: [birth year]  Age: 46 y.o. MRN: [id number]  Chief Complaint  Patient presents with  . URI     dry cough, 1 week, light green phlegm , sinus drainage light green bloody mucus, sob for 3-4 days     Subjective:   A 2 week hx of upper resp congestion, purulent at times.  Was short of breath with tight lungs.  That may be a little better but coughing a lot Current allergies, medications, problem list, past/family and social histories reviewed.  Objective:  BP 112/80   Pulse 95   Temp 98.6 F (37 C)   Resp 16   Ht 5\' 3"  (1.6 m)   Wt 182 lb 9.6 oz (82.8 kg)   SpO2 97%   BMI 32.35 kg/m   Head congested.  Rhonchi.  Soft wheeze.    Assessment & Plan:   Assessment: 1. Cough   2. Rhonchi   3. Acute rhinosinusitis       Plan: Much clearer after inhaler   Orders Placed This Encounter  Procedures  . DG Chest 2 View    Standing Status:   Future    Number of Occurrences:   1    Standing Expiration Date:   07/05/2018    Order Specific Question:   Reason for Exam (SYMPTOM  OR DIAGNOSIS REQUIRED)    Answer:   rhonchi lll    Order Specific Question:   Preferred imaging location?    Answer:   External    Meds ordered this encounter  Medications  . albuterol (PROVENTIL) (2.5 MG/3ML) 0.083% nebulizer solution 2.5 mg  . albuterol (PROVENTIL HFA;VENTOLIN HFA) 108 (90 Base) MCG/ACT inhaler    Sig: Inhale 2 puffs into the lungs every 4 (four) hours as needed for wheezing or shortness of breath (cough, shortness of breath or wheezing.).    Dispense:  1 Inhaler    Refill:  1  . cefdinir (OMNICEF) 300 MG capsule    Sig: Take 2 capsules (600 mg total) by mouth daily.    Dispense:  20 capsule    Refill:  0  . fluticasone (FLONASE) 50 MCG/ACT nasal spray    Sig: Place 2 sprays into both nostrils daily.    Dispense:  16 g    Refill:  6         Patient Instructions    Use albuterol inhaler 2 puffs every 4-6 hours for cough  Take  omnicef (cefdinir) one twice daily for sinus infection  Fluticasone nose spray 2 sprays each nostril twice daily for 3 days then once daily  Take over-the-counter Claritin D for head congestion  Return if worse or not improving   IF you received an x-ray today, you will receive an invoice from [facility]iology. Please contact [facility] Radiology at [phone] with questions or concerns regarding your invoice.   IF you received labwork today, you will receive an invoice from [facility]. Please contact [facility] at [phone] with questions or concerns regarding your invoice.   Our billing staff will not be able to assist you with questions regarding bills from these companies.  You will be contacted with the lab results as soon as they are available. The fastest way to get your results is to activate your My Chart account. Instructions are located on the last page of this paperwork. If you have not heard from [place] regarding the results in 2 weeks, please contact this office.  No Follow-up on file.   [name],DAVID, MD 07/05/2017

## 2017-07-05 NOTE — Patient Instructions (Addendum)
  Use albuterol inhaler 2 puffs every 4-6 hours for cough  Take omnicef (cefdinir) one twice daily for sinus infection  Fluticasone nose spray 2 sprays each nostril twice daily for 3 days then once daily  Benzonatate cough pills 1-2 pills  3 times daily as needed  Take over-the-counter Claritin D for head congestion  Return if worse or not improving   IF you received an x-ray today, you will receive an invoice from [facility] Radiology. Please contact [facility] Radiology at [phone] with questions or concerns regarding your invoice.   IF you received labwork today, you will receive an invoice from [place]. Please contact [facility] at [phone] with questions or concerns regarding your invoice.   Our billing staff will not be able to assist you with questions regarding bills from these companies.  You will be contacted with the lab results as soon as they are available. The fastest way to get your results is to activate your My Chart account. Instructions are located on the last page of this paperwork. If you have not heard from [place] regarding the results in 2 weeks, please contact this office.

## 2017-07-06 ENCOUNTER — Ambulatory Visit (INDEPENDENT_AMBULATORY_CARE_PROVIDER_SITE_OTHER): Payer: BLUE CROSS/BLUE SHIELD | Admitting: Allergy and Immunology

## 2017-07-06 ENCOUNTER — Encounter: Payer: Self-pay | Admitting: Allergy and Immunology

## 2017-07-06 VITALS — BP 118/78 | HR 87 | Temp 98.1°F | Resp 20

## 2017-07-06 DIAGNOSIS — J0100 Acute maxillary sinusitis, unspecified: Secondary | ICD-10-CM

## 2017-07-06 DIAGNOSIS — J3089 Other allergic rhinitis: Secondary | ICD-10-CM

## 2017-07-06 DIAGNOSIS — J019 Acute sinusitis, unspecified: Secondary | ICD-10-CM

## 2017-07-06 DIAGNOSIS — J4531 Mild persistent asthma with (acute) exacerbation: Secondary | ICD-10-CM

## 2017-07-06 DIAGNOSIS — J45909 Unspecified asthma, uncomplicated: Secondary | ICD-10-CM | POA: Insufficient documentation

## 2017-07-06 MED ORDER — FLUTICASONE PROPIONATE HFA 110 MCG/ACT IN AERO: 2.0000 | INHALATION_SPRAY | Freq: Two times a day (BID) | RESPIRATORY_TRACT | 5 refills | Status: DC

## 2017-07-06 MED ORDER — CARBINOXAMINE MALEATE 6 MG PO TABS: 1.0000 | ORAL_TABLET | Freq: Two times a day (BID) | ORAL | 0 refills | Status: DC

## 2017-07-06 NOTE — Assessment & Plan Note (Addendum)
   Continue/complete course of Ceftinir as prescribed.  Additional samples as well as a new prescription have been provided for Ryvent 6 mg every 8 hours if needed.  If insurance will not pay for right event, then for thick post nasal drainage, add guaifenesin 1200 mg (Mucinex Maximum Strength)  twice daily as needed with adequate hydration as discussed.  Continue fluticasone nasal spray, 2 sprays per nostril daily as needed.  Nasal saline lavage (NeilMed) has been recommended as needed and prior to medicated nasal sprays along with instructions for proper administration.

## 2017-07-06 NOTE — Assessment & Plan Note (Addendum)
Todays spirometry results, assessed while asymptomatic, suggest under-perception of bronchoconstriction.  Continue albuterol HFA, 1-2 inhalations every 4-6 hours as needed.  For now, and during respiratory tract infections or asthma flares, add Flovent 110g 2 inhalations 2 times per day until symptoms have returned to baseline.  A prescription has been provided.  To maximize pulmonary deposition, a spacer has been provided along with instructions for its proper administration with an HFA inhaler.  The patient has been asked to contact me if his symptoms persist or progress. Otherwise, he may return for follow up in 4 months.

## 2017-07-06 NOTE — Progress Notes (Signed)
Follow-up Note  RE: Logan [name] MRN: [id number] DOB: [birth year] Date of Office Visit: 07/06/2017  Primary care provider: [name], MD Referring provider: [name], MD  History of present illness: Logan [name] is a 46 y.o. male with mixed rhinitis, psoriasis, and hypertension presenting today for sick visit.  He was previously seen in this clinic for his initial evaluation on Dec 08, 2016.  He reports that since November 15 he has been experiencing nasal congestion, sinus pressure, and postnasal drainage.  He reports that these symptoms have progressed and he has been expectorating discolored mucus.  He was started on a 10-day course of Ceftinir at his primary care physician's office yesterday.  In addition, he has been experiencing a nonproductive cough, worse in the evening time, and on a few occasions experienced dyspnea and labored breathing.  As a result, he was prescribed Proventil.  He had a chest x-ray yesterday which was negative.  He reports that [name] was too expensive so he is using fluticasone nasal spray.  He reports that the samples of Ryvent were beneficial, however he did not get this script filled because the pharmacy did not stock this medication.  He denies heartburn.   Assessment and plan: Acute sinusitis  Continue/complete course of Ceftinir as prescribed.  Additional samples as well as a new prescription have been provided for Ryvent 6 mg every 8 hours if needed.  If insurance will not pay for right event, then for thick post nasal drainage, add guaifenesin 1200 mg (Mucinex Maximum Strength)  twice daily as needed with adequate hydration as discussed.  Continue fluticasone nasal spray, 2 sprays per nostril daily as needed.  Nasal saline lavage (NeilMed) has been recommended as needed and prior to medicated nasal sprays along with instructions for proper administration.  Asthmatic bronchitis Todays spirometry results, assessed while  asymptomatic, suggest under-perception of bronchoconstriction.  Continue albuterol HFA, 1-2 inhalations every 4-6 hours as needed.  For now, and during respiratory tract infections or asthma flares, add Flovent 110g 2 inhalations 2 times per day until symptoms have returned to baseline.  A prescription has been provided.  To maximize pulmonary deposition, a spacer has been provided along with instructions for its proper administration with an HFA inhaler.  The patient has been asked to contact me if his symptoms persist or progress. Otherwise, he may return for follow up in 4 months.  Allergic rhinitis with a nonallergic component  Continue appropriate allergen avoidance measures.  Treatment plan as outlined above for acute sinusitis.   Meds ordered this encounter  Medications  . Carbinoxamine Maleate (RYVENT) 6 MG TABS    Sig: Take 1 tablet by mouth 2 (two) times daily.    Dispense:  30 tablet    Refill:  0    BIN [id number]   RX PCN [id number]   Group [id number]   Cardholder ID    [id number] CASH PAY ONLY  . fluticasone (FLOVENT HFA) 110 MCG/ACT inhaler    Sig: Inhale 2 puffs into the lungs 2 (two) times daily.    Dispense:  1 Inhaler    Refill:  5  . DISCONTD: fluticasone (FLOVENT HFA) 110 MCG/ACT inhaler    Sig: Inhale 2 puffs into the lungs 2 (two) times daily.    Dispense:  1 Inhaler    Refill:  5    Diagnostics: Spirometry reveals an FVC of 3.12 L and an FEV1 of 2.66 L (85% predicted) with 250 mL postbronchodilator improvement.  Please  see scanned spirometry results for details.    Physical examination: Blood pressure 118/78, pulse 87, temperature 98.1 F (36.7 C), temperature source Oral, resp. rate 20, SpO2 94 %.  General: Alert, interactive, in no acute distress. HEENT: TMs pearly gray, turbinates moderately edematous with thick discharge, post-pharynx erythematous. Neck: Supple without lymphadenopathy. Lungs: Clear to auscultation without wheezing, rhonchi or  rales. CV: Normal S1, S2 without murmurs. Skin: Warm and dry, without lesions or rashes.  The following portions of the patient's history were reviewed and updated as appropriate: allergies, current medications, past family history, past medical history, past social history, past surgical history and problem list.  Allergies as of 07/06/2017      Reactions   Penicillins Hives, Rash   Has patient had a PCN reaction causing immediate rash, facial/tongue/throat swelling, SOB or lightheadedness with hypotension: Yes Has patient had a PCN reaction causing severe rash involving mucus membranes or skin necrosis: No Has patient had a PCN reaction that required hospitalization No Has patient had a PCN reaction occurring within the last 10 years: No If all of the above answers are "NO", then may proceed with Cephalosporin use.      Medication List        Accurate as of 07/06/17  6:48 PM. Always use your most recent med list.          albuterol 108 (90 Base) MCG/ACT inhaler Commonly known as:  PROVENTIL HFA;VENTOLIN HFA Inhale 2 puffs into the lungs every 4 (four) hours as needed for wheezing or shortness of breath (cough, shortness of breath or wheezing.).   aspirin EC 81 MG tablet Take 81 mg by mouth daily.   atorvastatin 20 MG tablet Commonly known as:  LIPITOR Take 1 tablet (20 mg total) daily at 6 PM by mouth.   B-complex with vitamin C tablet Take 1 tablet by mouth daily.   benzonatate 100 MG capsule Commonly known as:  TESSALON Take 1-2 capsules (100-200 mg total) by mouth 3 (three) times daily as needed for cough.   Carbinoxamine Maleate 6 MG Tabs Commonly known as:  RYVENT Take 1 tablet by mouth 2 (two) times daily.   cefdinir 300 MG capsule Commonly known as:  OMNICEF Take 2 capsules (600 mg total) by mouth daily.   cyclobenzaprine 5 MG tablet Commonly known as:  FLEXERIL 1 pill by mouth up to every 8 hours as needed. Start with one pill by mouth each bedtime as  needed due to sedation   dapagliflozin propanediol 10 MG Tabs tablet Commonly known as:  FARXIGA Take 10 mg by mouth daily.   fluticasone 110 MCG/ACT inhaler Commonly known as:  FLOVENT HFA Inhale 2 puffs into the lungs 2 (two) times daily.   XHANCE 93 MCG/ACT Exhu Generic drug:  Fluticasone Propionate   fluticasone 50 MCG/ACT nasal spray Commonly known as:  FLONASE Place 2 sprays into both nostrils daily.   lisinopril 10 MG tablet Commonly known as:  PRINIVIL,ZESTRIL Take 1 tablet (10 mg total) by mouth daily.   loratadine 10 MG tablet Commonly known as:  CLARITIN Take 10 mg by mouth daily.   multivitamin with minerals Tabs tablet Take 1 tablet by mouth daily.   sitaGLIPtin-metformin 50-1000 MG tablet Commonly known as:  JANUMET Take 1 tablet by mouth 2 (two) times daily with a meal.   vitamin B-12 1000 MCG tablet Commonly known as:  CYANOCOBALAMIN Take 1,000 mcg by mouth daily.   vitamin C 500 MG tablet Commonly known as:  ASCORBIC ACID  Take 500 mg by mouth daily.       Allergies  Allergen Reactions  . Penicillins Hives and Rash    Has patient had a PCN reaction causing immediate rash, facial/tongue/throat swelling, SOB or lightheadedness with hypotension: Yes Has patient had a PCN reaction causing severe rash involving mucus membranes or skin necrosis: No Has patient had a PCN reaction that required hospitalization No Has patient had a PCN reaction occurring within the last 10 years: No If all of the above answers are "NO", then may proceed with Cephalosporin use.    Review of systems: Review of systems negative except as noted in HPI / PMHx or noted below: Constitutional: Negative.  HENT: Negative.   Eyes: Negative.  Respiratory: Negative.   Cardiovascular: Negative.  Gastrointestinal: Negative.  Genitourinary: Negative.  Musculoskeletal: Negative.  Neurological: Negative.  Endo/Heme/Allergies: Negative.  Cutaneous: Negative.  Past Medical  History:  Diagnosis Date  . Asthmatic bronchitis 07/06/2017  . Diabetes mellitus    takes Janumet and Invokana daily  . Hernia, umbilical   . Hypertension    takes Lisinopril daily  . Plaque psoriasis   . Seasonal allergies    takes Claritin daily and uses Flonase daily    Family History  Problem Relation Age of Onset  . Hypertension Mother   . Diverticulosis Mother   . Osteoporosis Mother   . Diabetes Father   . Alcohol abuse Father   . Osteoporosis Sister   . Hypertension Sister   . Diabetes Brother   . Hypertension Brother   . Hypertension Maternal Grandmother   . Heart disease Maternal Grandfather   . Diabetes Paternal Grandfather   . Diabetes Brother   . Hypertension Brother   . Heart attack Brother   . Osteoporosis Sister     Social History   Socioeconomic History  . Marital status: Single    Spouse name: Not on file  . Number of children: Not on file  . Years of education: Not on file  . Highest education level: Not on file  Social Needs  . Financial resource strain: Not on file  . Food insecurity - worry: Not on file  . Food insecurity - inability: Not on file  . Transportation needs - medical: Not on file  . Transportation needs - non-medical: Not on file  Occupational History  . Not on file  Tobacco Use  . Smoking status: Never Smoker  . Smokeless tobacco: Never Used  Substance and Sexual Activity  . Alcohol use: Yes    Comment: occ glass of wine.2-3 per month  . Drug use: No  . Sexual activity: No  Other Topics Concern  . Not on file  Social History Narrative  . Not on file    I appreciate the opportunity to take part in [place]'s care. Please do not hesitate to contact me with questions.  Sincerely,   R. Edgar Frisk, MD

## 2017-07-06 NOTE — Assessment & Plan Note (Signed)
   Continue appropriate allergen avoidance measures.  Treatment plan as outlined above for acute sinusitis. 

## 2017-07-06 NOTE — Patient Instructions (Addendum)
Acute sinusitis  Continue/complete course of Ceftinir as prescribed.  Additional samples as well as a new prescription have been provided for Ryvent 6 mg every 8 hours if needed.  If insurance will not pay for right event, then for thick post nasal drainage, add guaifenesin 1200 mg (Mucinex Maximum Strength)  twice daily as needed with adequate hydration as discussed.  Continue fluticasone nasal spray, 2 sprays per nostril daily as needed.  Nasal saline lavage (NeilMed) has been recommended as needed and prior to medicated nasal sprays along with instructions for proper administration.  Asthmatic bronchitis Todays spirometry results, assessed while asymptomatic, suggest under-perception of bronchoconstriction.  Continue albuterol HFA, 1-2 inhalations every 4-6 hours as needed.  For now, and during respiratory tract infections or asthma flares, add Flovent 110g 2 inhalations 2 times per day until symptoms have returned to baseline.  A prescription has been provided.  To maximize pulmonary deposition, a spacer has been provided along with instructions for its proper administration with an HFA inhaler.  The patient has been asked to contact me if his symptoms persist or progress. Otherwise, he may return for follow up in 4 months.  Allergic rhinitis with a nonallergic component  Continue appropriate allergen avoidance measures.  Treatment plan as outlined above for acute sinusitis.   Return in about 4 months (around 11/03/2017), or if symptoms worsen or fail to improve.

## 2017-07-13 ENCOUNTER — Other Ambulatory Visit: Payer: Self-pay | Admitting: Allergy and Immunology

## 2017-07-13 DIAGNOSIS — J3089 Other allergic rhinitis: Secondary | ICD-10-CM

## 2017-07-13 DIAGNOSIS — J0100 Acute maxillary sinusitis, unspecified: Secondary | ICD-10-CM

## 2017-07-13 NOTE — Telephone Encounter (Signed)
Patient is requesting an alternative to the inhaler that was sent in for him. He said it is about $185 and he needs a less expensive one. He said Dr. Starling Manns talked about a few different ones with him and the pharmacist told him that any of the others would be less expensive. He would also like to know if a prescription could be sent for Musinex 12 hour.

## 2017-07-13 NOTE — Telephone Encounter (Signed)
Flovent 110 mcg was sent in at last OV. Please advise and thank you.

## 2017-07-13 NOTE — Telephone Encounter (Signed)
Which ones are covered by his insurance at a lower cost? Also, please send in a script for Mucinex 1200 mg bid prn. Thanks.

## 2017-07-14 MED ORDER — CARBINOXAMINE MALEATE 6 MG PO TABS: 1.0000 | ORAL_TABLET | Freq: Two times a day (BID) | ORAL | 5 refills | Status: DC

## 2017-07-14 MED ORDER — GUAIFENESIN ER 600 MG PO TB12: 1200.0000 mg | ORAL_TABLET | Freq: Two times a day (BID) | ORAL | 2 refills | Status: DC | PRN

## 2017-07-14 NOTE — Addendum Note (Signed)
Addended by: Lucrezia Starch I on: 07/14/2017 10:47 AM   Modules accepted: Orders

## 2017-07-14 NOTE — Telephone Encounter (Signed)
Left detailed message for patient advising of samples up front. Will await input from Dr. Verlin Fester on inhaler.

## 2017-07-14 NOTE — Addendum Note (Signed)
Addended by: Lucrezia Starch I on: 07/14/2017 02:14 PM   Modules accepted: Orders

## 2017-07-14 NOTE — Telephone Encounter (Signed)
I have spoken with patient and explained this information to him. He states that he understands. I also sent the Ryvent in again with extra refills. He will come by and pick up the sample at his earliest convenience.

## 2017-07-14 NOTE — Telephone Encounter (Signed)
If all the inhalers are all tier 3, we may as well as stay with Flovent.  Please explain this to the patient and provide samples. Thanks.

## 2017-07-14 NOTE — Telephone Encounter (Signed)
With this insurance all inhalers are considered tier 3 , so they will all have a higher copay. I do have a sample of the flovent 110 and some mucinex I will leave for him.

## 2017-10-12 ENCOUNTER — Telehealth: Payer: Self-pay | Admitting: Family Medicine

## 2017-10-12 NOTE — Telephone Encounter (Signed)
10/12/2017 - PATIENT DROPPED OFF A FORM AT [place] TO FILL OUT. PATIENT SAID DR. GREENE FILLED THIS SAME KIND OF FORM OUT FOR HIM A YEAR AGO. PLEASE CALL HIM WHEN IT CAN BE PICKED UP. HE ALSO WANTS TO ASK DR. GREENE WHEN HE NEEDS TO RETURN FOR AN OFFICE VISIT? THE PATIENT'S FORM HAS BEEN PLACED IN DR. Vonna Kotyk [address] AT [place] [address] [place] [address] PHONE [phone] (CELL)

## 2017-10-18 ENCOUNTER — Telehealth: Payer: Self-pay | Admitting: Family Medicine

## 2017-10-18 ENCOUNTER — Other Ambulatory Visit: Payer: Self-pay | Admitting: Family Medicine

## 2017-10-18 DIAGNOSIS — I10 Essential (primary) hypertension: Secondary | ICD-10-CM

## 2017-10-18 NOTE — Telephone Encounter (Signed)
Copied from [place] [id number]. Topic: Quick Communication - See Telephone Encounter >> Oct 18, 2017  5:20 PM Percell Belt A wrote: CRM for notification. See Telephone encounter for:  pt called in said that he is checking the status of the form.  Pt has been without meds for a while now and said that sugar is going up.  He would like a solution?  Does he need to change meds?  Please advise.  This med is sitting at pharmacy for $300.00 10/18/17.

## 2017-10-21 NOTE — Telephone Encounter (Signed)
Called patient.  Due for diabetes follow up. Off Janumet for past approx 2 months due to cost. Has [employer] HSA, but price of Janumet too high.  Has continued to take [place] and other meds are ok costwise. Home blood sugars in 180 up to 200.   Will rx metformin 1000mg  BID for now, fille out med assistance form to see if Janumet will be covered. Continue farxiga, and plans to follow up in next few weeks.

## 2017-10-22 ENCOUNTER — Other Ambulatory Visit: Payer: Self-pay

## 2017-10-22 ENCOUNTER — Telehealth: Payer: Self-pay

## 2017-10-22 DIAGNOSIS — I10 Essential (primary) hypertension: Secondary | ICD-10-CM

## 2017-10-22 MED ORDER — METFORMIN HCL 1000 MG PO TABS: 1000.0000 mg | ORAL_TABLET | Freq: Two times a day (BID) | ORAL | 0 refills | Status: DC

## 2017-10-22 MED ORDER — LISINOPRIL 10 MG PO TABS: 10.0000 mg | ORAL_TABLET | Freq: Every day | ORAL | 0 refills | Status: DC

## 2017-10-22 NOTE — Telephone Encounter (Addendum)
Relation to pt: self  Call back number: [phone] [id number] [place], [place] [phone] (Phone) [phone] (Fax)     Reason for call:  Patient checking on the status of metformin and would [facility].

## 2017-10-22 NOTE — Telephone Encounter (Signed)
10/22/2017 - I LEFT A MESSAGE ON  THIS PATIENT'S VOICE MAIL THAT THE [facility] PATIENT ASSISTANCE FORMS HE DROPPED OFF FOR DR. Carlota Raspberry IS READY FOR HIM TO PICK UP. I INFORMED HIM TO COME TO [address]. [place]

## 2017-10-25 NOTE — Telephone Encounter (Signed)
[facility] to [facility] [address], spoke with Verdis Frederickson. She states patient picked up his Metformin on 3/17. Closing note.

## 2017-11-01 ENCOUNTER — Other Ambulatory Visit: Payer: Self-pay | Admitting: Family Medicine

## 2017-11-08 ENCOUNTER — Ambulatory Visit: Payer: BLUE CROSS/BLUE SHIELD | Admitting: Allergy and Immunology

## 2017-11-08 ENCOUNTER — Encounter: Payer: Self-pay | Admitting: Physician Assistant

## 2017-11-10 NOTE — Telephone Encounter (Signed)
Encounter created in error

## 2017-11-15 ENCOUNTER — Other Ambulatory Visit: Payer: Self-pay | Admitting: Family Medicine

## 2017-11-15 ENCOUNTER — Ambulatory Visit (INDEPENDENT_AMBULATORY_CARE_PROVIDER_SITE_OTHER): Payer: BLUE CROSS/BLUE SHIELD | Admitting: Family Medicine

## 2017-11-15 ENCOUNTER — Ambulatory Visit: Payer: BLUE CROSS/BLUE SHIELD

## 2017-11-15 ENCOUNTER — Other Ambulatory Visit: Payer: Self-pay

## 2017-11-15 ENCOUNTER — Encounter: Payer: Self-pay | Admitting: Family Medicine

## 2017-11-15 VITALS — BP 110/70 | HR 90 | Temp 98.0°F | Ht 62.5 in | Wt 177.0 lb

## 2017-11-15 DIAGNOSIS — E1165 Type 2 diabetes mellitus with hyperglycemia: Secondary | ICD-10-CM

## 2017-11-15 DIAGNOSIS — I10 Essential (primary) hypertension: Secondary | ICD-10-CM

## 2017-11-15 DIAGNOSIS — E785 Hyperlipidemia, unspecified: Secondary | ICD-10-CM | POA: Diagnosis not present

## 2017-11-15 LAB — COMPREHENSIVE METABOLIC PANEL
ALT: 46 IU/L — ABNORMAL HIGH (ref 0–44)
AST: 27 IU/L (ref 0–40)
Albumin/Globulin Ratio: 1.9 (ref 1.2–2.2)
Albumin: 5.1 g/dL (ref 3.5–5.5)
Alkaline Phosphatase: 114 IU/L (ref 39–117)
BUN/Creatinine Ratio: 17 (ref 9–20)
BUN: 14 mg/dL (ref 6–24)
Bilirubin Total: 0.7 mg/dL (ref 0.0–1.2)
CO2: 17 mmol/L — ABNORMAL LOW (ref 20–29)
Calcium: 9.6 mg/dL (ref 8.7–10.2)
Chloride: 97 mmol/L (ref 96–106)
Creatinine, Ser: 0.83 mg/dL (ref 0.76–1.27)
GFR calc Af Amer: 120 mL/min/{1.73_m2} (ref >59)
GFR calc non Af Amer: 104 mL/min/{1.73_m2} (ref >59)
Globulin, Total: 2.7 g/dL (ref 1.5–4.5)
Glucose: 313 mg/dL — ABNORMAL HIGH (ref 65–99)
Potassium: 4.6 mmol/L (ref 3.5–5.2)
Sodium: 134 mmol/L (ref 134–144)
Total Protein: 7.8 g/dL (ref 6.0–8.5)

## 2017-11-15 LAB — LIPID PANEL
Chol/HDL Ratio: 4.2 ratio (ref 0.0–5.0)
Cholesterol, Total: 169 mg/dL (ref 100–199)
HDL: 40 mg/dL (ref >39)
LDL Calculated: 98 mg/dL (ref 0–99)
Triglycerides: 156 mg/dL — ABNORMAL HIGH (ref 0–149)
VLDL Cholesterol Cal: 31 mg/dL (ref 5–40)

## 2017-11-15 LAB — POCT GLYCOSYLATED HEMOGLOBIN (HGB A1C): Hemoglobin A1C: 12.2

## 2017-11-15 LAB — GLUCOSE, POCT (MANUAL RESULT ENTRY): POC Glucose: 325 mg/dl — AB (ref 70–99)

## 2017-11-15 MED ORDER — INSULIN PEN NEEDLE 31G X 5 MM MISC
1.0000 | Freq: Every day | 1 refills | Status: DC
Start: 2017-11-15 — End: 2018-02-09

## 2017-11-15 MED ORDER — ATORVASTATIN CALCIUM 20 MG PO TABS: 20.0000 mg | ORAL_TABLET | Freq: Every day | ORAL | 1 refills | Status: DC

## 2017-11-15 MED ORDER — INSULIN GLARGINE 100 UNIT/ML SOLOSTAR PEN
10.0000 [IU] | PEN_INJECTOR | Freq: Every day | SUBCUTANEOUS | 1 refills | Status: DC
Start: 2017-11-15 — End: 2017-12-27

## 2017-11-15 MED ORDER — BLOOD GLUCOSE METER KIT: PACK | 0 refills | Status: AC

## 2017-11-15 MED ORDER — LISINOPRIL 10 MG PO TABS: 10.0000 mg | ORAL_TABLET | Freq: Every day | ORAL | 1 refills | Status: DC

## 2017-11-15 NOTE — Patient Instructions (Addendum)
I will check cholesterol, but if elevated may be due to lower dose of Lipitor. Restart lipitor 20mg  per day.   Blood pressure looks ok. No change in meds today.   Due to level of diabetes, will need to start insulin.  Start with 10 units once per day.  Increase by 2 units every 3 days, until you blood sugars remain below 200. Then stay at that dose once per day.  Check blood sugars 3 times per day.  See precautions on low blood sugar, but I do not expect that to occur. Continue metformin twice per day.,   Recheck in 2 weeks with blood sugar readings to determine other changes.     Insulin Injection Instructions, Using Insulin Pens, Adult A subcutaneous injection is a shot of medicine that is injected into the layer of fat between skin and muscle. People with type 1 diabetes must take insulin because their bodies do not make it. People with type 2 diabetes may need to take insulin. There are many different types of insulin. The type of insulin that you take may determine how many injections you give yourself and when you need to take the injections. Choosing a site for injection Insulin absorption varies from site to site. It is best to inject insulin within the same body area, using a different spot in that area for each injection. Do not inject the insulin in the same spot for each injection. There are five main areas that can be used for injecting. These areas include:  Abdomen. This is the preferred area.  Front of thigh.  Upper, outer side of thigh.  Back of upper arm.  Buttocks.  Using an insulin pen First, follow the steps for Getting Ready, then continue with the steps for Injecting the Insulin. Getting Ready 1. Wash your hands with soap and water. If soap and water are not available, use hand sanitizer. 2. Check the expiration date and type of insulin in the pen. 3. If you are using CLEAR insulin, check to see that it is clear and free of clumps. 4. If you are using CLOUDY  insulin, gently roll the pen between your palms several times, or tip the pen up and down several times to mix up the medicine. Do not shake the pen. 5. Remove the cap from the insulin pen. 6. Use an alcohol wipe to clean the rubber stopper of the pen cartridge. 7. Remove the protective paper tab from the disposable needle. Do not let the needle touch anything. 8. Screw the needle onto the pen. 9. Remove the outer and inner plastic covers from the needle. Do not throw away the outer plastic cover yet. 10. Prime the insulin pen by turning the button (dial) to 2 units. Hold the pen with the needle pointing up, and push the button on the opposite end of the pen until a drop of insulin appears at the needle tip. If no insulin appears, repeat this step. 11. Dial the number of units of insulin that you will be injecting. Injecting the Insulin  1. Use an alcohol wipe to clean the site where you will be injecting the needle. Let the site air-dry. 2. Hold the pen in the palm of your writing hand with your thumb on the top. 3. If directed by your health care provider, use your other hand to pinch and hold about an inch of skin at the injection site. Do not directly touch the cleaned part of the skin. 4. Gently but quickly,  put the needle straight into the skin. The needle should be at a 90-degree angle (perpendicular) to the skin, as if to form the letter "L." ? For example, if you are giving an injection in the abdomen, the abdomen forms one "leg" of the "L" and the needle forms the other "leg" of the "L." 5. For adults who have a small amount of body fat, the needle may need to be injected at a 45-degree angle instead. Your health care provider will tell you if this is necessary. ? A 45-degree angle looks like the letter "V." 6. When the needle is completely inserted into the skin, use the thumb of your writing hand to push the top button of the pen down all the way to inject the insulin. 7. Let go of the  skin that you are pinching. Continue to hold the pen in place with your writing hand. 8. Wait five seconds, then pull the needle straight out of the skin. 9. Carefully put the larger (outer) plastic cover of the needle back over the needle, then unscrew the capped needle and discard it in a sharps container, such as an empty plastic bottle with a cover. 10. Put the plastic cap back on the insulin pen. Throwing away supplies  Discard all used needles in a puncture-proof sharps disposal container. You can ask your local pharmacy about where you can get this kind of disposal container, or you can use an empty liquid laundry detergent bottle that has a cover.  Follow the disposal regulations for the area where you live. Do not use any needle more than one time.  Throw away empty disposable pens in the regular trash. What questions should I ask my health care provider?  How often should I be taking insulin?  How often should I check my blood glucose?  What amount of insulin should I be taking at each time?  What are the side effects?  What should I do if my blood glucose is too high?  What should I do if my blood glucose is too low?  What should I do if I forget to take my insulin?  What number should I call if I have questions? Where can I get more information?  American Diabetes Association (ADA): www.diabetes.org  American Association of Diabetes Educators (AADE) Patient Resources: https://www.diabeteseducator.org/patient-resources This information is not intended to replace advice given to you by your health care provider. Make sure you discuss any questions you have with your health care provider. Document Released: 08/30/2015 Document Revised: 01/02/2016 Document Reviewed: 08/30/2015 Elsevier Interactive Patient Education  2018 [employer].   IF you received an x-ray today, you will receive an invoice from [facility]. Please contact [facility] at  [phone] with questions or concerns regarding your invoice.   IF you received labwork today, you will receive an invoice from [facility]. Please contact [facility] at [phone] with questions or concerns regarding your invoice.   Our billing staff will not be able to assist you with questions regarding bills from these companies.  You will be contacted with the lab results as soon as they are available. The fastest way to get your results is to activate your My Chart account. Instructions are located on the last page of this paperwork. If you have not heard from [place] regarding the results in 2 weeks, please contact this office.

## 2017-11-15 NOTE — Progress Notes (Signed)
Patient returns to clinic with medication and meter for new insulin teaching. Patient is able to demonstrate to this RN proper technique for taking blood sugar and self-insulin administration. Reviewed AVS from today's visit with Dr. Carlota Raspberry with patient, he verbalizes understanding of results and is able to teach back information to RN. Patient will need pen needles for insulin- [facility] [address]. Also requests metformin refill be sent in if he is to continue dose.

## 2017-11-15 NOTE — Progress Notes (Signed)
Patient returned for insulin teaching. During visit, patient mentioned continuing on dose of Metformin twice per day. Medication pended for provider signature.

## 2017-11-15 NOTE — Progress Notes (Addendum)
Subjective:  By signing my name below, I, [name], attest that this documentation has been prepared under the direction and in the presence of [name], MD Electronically Signed: [name], ED Scribe 11/15/2017 at 11:39 AM.   Patient ID: Logan [name], male    DOB: [birth year], 47 y.o.   MRN: [id number]  Chief Complaint  Patient presents with  . med check    go over medication (wants a full panel bloodwork) out of alot of medication   HPI Logan [name] is a 47 y.o. male who presents to Primary Care at [facility] for f/u of DM. Last seen by me 10/29. Normal urine micro albumin July 2018. Continued in Oct on Janumet 50-1000 mg bid and Farxiga 10 mg qd. See telephone note 3/11. Had been off Janumet for 2 months due to cost. Was taking [place]. Home blood sugars 180-200. Started on metformin 1000 mg bid in the interim but medication assistance form completed for Janumet. Optho 05/11/17. Foot exam performed today. Had a pneumovax 11/2014. Did ask him to f/u in 3-4 wks when seen in Oct due to elevated A1C.  He reports he has not been exercising or adhering to diet as well as he should.   Lab Results  Component Value Date   HGBA1C 9.2 (H) 06/07/2017   Diabetic Foot Exam - Simple   Simple Foot Form Diabetic Foot exam was performed with the following findings:  Yes 11/15/2017 11:33 AM  Visual Inspection No deformities, no ulcerations, no other skin breakdown bilaterally:  Yes Sensation Testing Intact to touch and monofilament testing bilaterally:  Yes Pulse Check Posterior Tibialis and Dorsalis pulse intact bilaterally:  Yes Comments   Pt has not been taking [place] either due to cost. Has been off of meds for 3-4 wks. He has been taking metformin 1000 mg bid. Checking blood glucose with readings of 250s-280s. He has noticed some intermittent fatigue and dry mouth but denies nausea, vomiting, abdominal pain, lightheadedness, dizziness, fatigue today.  HTN Lab Results    Component Value Date   CREATININE 0.90 06/07/2017   BP Readings from Last 3 Encounters:  11/15/17 110/70  07/06/17 118/78  07/05/17 112/80  Lisinopril 10 mg qd. Pt has been compliant with BP meds.  Hyperlipidemia Lab Results  Component Value Date   CHOL 187 06/07/2017   HDL 35 (L) 06/07/2017   LDLCALC 134 (H) 06/07/2017   TRIG 88 06/07/2017   CHOLHDL 5.3 (H) 06/07/2017   Lab Results  Component Value Date   ALT 56 (H) 06/07/2017   AST 33 06/07/2017   ALKPHOS 104 06/07/2017   BILITOT 0.6 06/07/2017  Lipitor was increased to 20 mg qd in Nov with goal LDL less than 70. Pt states that he ran out of Lipitor 20 mg since it was on hold at [facility] along with his DM meds. He has been taking previously prescribed 10 mg tabs for a few wks. Denies side-effects.  Patient Active Problem List   Diagnosis Date Noted  . Acute sinusitis 07/06/2017  . Asthmatic bronchitis 07/06/2017  . Allergic rhinitis with a nonallergic component 12/08/2016  . Psoriasis 12/08/2016  . Diabetes ([place]) 05/31/2012  . Hypertension 05/31/2012   Past Medical History:  Diagnosis Date  . Asthmatic bronchitis 07/06/2017  . Diabetes mellitus    takes Janumet and Invokana daily  . Hernia, umbilical   . Hypertension    takes Lisinopril daily  . Plaque psoriasis   . Seasonal allergies    takes  Claritin daily and uses Flonase daily   Past Surgical History:  Procedure Laterality Date  . INSERTION OF MESH N/A 05/07/2016   Procedure: INSERTION OF MESH;  Surgeon: Erroll Luna, MD;  Location: [place];  Service: General;  Laterality: N/A;  . TYMPANOSTOMY TUBE PLACEMENT    . VENTRAL HERNIA REPAIR N/A 05/07/2016   Procedure: REPAIR VENTRAL HERNIA;  Surgeon: Erroll Luna, MD;  Location: [place];  Service: General;  Laterality: N/A;   Allergies  Allergen Reactions  . Penicillins Hives and Rash    Has patient had a PCN reaction causing immediate rash, facial/tongue/throat swelling, SOB or lightheadedness with  hypotension: Yes Has patient had a PCN reaction causing severe rash involving mucus membranes or skin necrosis: No Has patient had a PCN reaction that required hospitalization No Has patient had a PCN reaction occurring within the last 10 years: No If all of the above answers are "NO", then may proceed with Cephalosporin use.    Prior to Admission medications   Medication Sig Start Date End Date Taking? Authorizing Provider  albuterol (PROVENTIL HFA;VENTOLIN HFA) 108 (90 Base) MCG/ACT inhaler Inhale 2 puffs into the lungs every 4 (four) hours as needed for wheezing or shortness of breath (cough, shortness of breath or wheezing.). 07/05/17   Posey Boyer, MD  aspirin EC 81 MG tablet Take 81 mg by mouth daily.    [provider]  atorvastatin (LIPITOR) 20 MG tablet Take 1 tablet (20 mg total) daily at 6 PM by mouth. 06/13/17   [name], MD  B Complex-C (B-COMPLEX WITH VITAMIN C) tablet Take 1 tablet by mouth daily.    [provider]  benzonatate (TESSALON) 100 MG capsule Take 1-2 capsules (100-200 mg total) by mouth 3 (three) times daily as needed for cough. 07/05/17   Posey Boyer, MD  Carbinoxamine Maleate (RYVENT) 6 MG TABS Take 1 tablet by mouth 2 (two) times daily. 07/14/17   Bobbitt, Sedalia Muta, MD  cefdinir (OMNICEF) 300 MG capsule Take 2 capsules (600 mg total) by mouth daily. 07/05/17   Posey Boyer, MD  cyclobenzaprine (FLEXERIL) 5 MG tablet 1 pill by mouth up to every 8 hours as needed. Start with one pill by mouth each bedtime as needed due to sedation Patient not taking: Reported on 07/06/2017 05/04/16   [name], MD  FARXIGA 10 MG TABS tablet TAKE 1 TABLET BY MOUTH EVERY DAY 10/19/17   [name], MD  fluticasone [facility]) 50 MCG/ACT nasal spray Place 2 sprays into both nostrils daily. 07/05/17   Posey Boyer, MD  fluticasone (FLOVENT HFA) 110 MCG/ACT inhaler Inhale 2 puffs into the lungs 2 (two) times daily. 07/06/17   Bobbitt,  Sedalia Muta, MD  guaiFENesin (MUCINEX) 600 MG 12 hr tablet Take 2 tablets (1,200 mg total) by mouth 2 (two) times daily as needed. 07/14/17   Bobbitt, Sedalia Muta, MD  lisinopril (PRINIVIL,ZESTRIL) 10 MG tablet Take 1 tablet (10 mg total) by mouth daily. 10/22/17   [name], MD  loratadine (CLARITIN) 10 MG tablet Take 10 mg by mouth daily.    [provider]  metFORMIN (GLUCOPHAGE) 1000 MG tablet Take 1 tablet (1,000 mg total) by mouth 2 (two) times daily with a meal. 10/22/17   [name], MD  Multiple Vitamin (MULTIVITAMIN WITH MINERALS) TABS tablet Take 1 tablet by mouth daily.    [provider]  sitaGLIPtin-metformin (JANUMET) 50-1000 MG tablet Take 1 tablet by mouth 2 (two) times  daily with a meal. 06/07/17   [name], MD  vitamin B-12 (CYANOCOBALAMIN) 1000 MCG tablet Take 1,000 mcg by mouth daily.    [provider]  vitamin C (ASCORBIC ACID) 500 MG tablet Take 500 mg by mouth daily.    [provider]  [name] 31 MCG/ACT Laurann Montana  03/30/17   [provider]   Social History   Socioeconomic History  . Marital status: Single    Spouse name: Not on file  . Number of children: Not on file  . Years of education: Not on file  . Highest education level: Not on file  Occupational History  . Not on file  Social Needs  . Financial resource strain: Not on file  . Food insecurity:    Worry: Not on file    Inability: Not on file  . Transportation needs:    Medical: Not on file    Non-medical: Not on file  Tobacco Use  . Smoking status: Never Smoker  . Smokeless tobacco: Never Used  Substance and Sexual Activity  . Alcohol use: Yes    Comment: occ glass of wine.2-3 per month  . Drug use: No  . Sexual activity: Never  Lifestyle  . Physical activity:    Days per week: Not on file    Minutes per session: Not on file  . Stress: Not on file  Relationships  . Social connections:    Talks on phone: Not on file    Gets  together: Not on file    Attends religious service: Not on file    Active member of club or organization: Not on file    Attends meetings of clubs or organizations: Not on file    Relationship status: Not on file  . Intimate partner violence:    Fear of current or ex partner: Not on file    Emotionally abused: Not on file    Physically abused: Not on file    Forced sexual activity: Not on file  Other Topics Concern  . Not on file  Social History Narrative  . Not on file   Review of Systems  Constitutional: Positive for fatigue (intermittent). Negative for unexpected weight change.  Eyes: Negative for visual disturbance.  Respiratory: Negative for cough, chest tightness and shortness of breath.   Cardiovascular: Negative for chest pain, palpitations and leg swelling.  Gastrointestinal: Negative for abdominal pain, blood in stool, nausea and vomiting.  Endocrine: Positive for polydipsia (intermittent).  Neurological: Negative for dizziness, light-headedness and headaches.      Objective:   Physical Exam  Constitutional: He is oriented to person, place, and time. He appears well-developed and well-nourished.  HENT:  Head: Normocephalic and atraumatic.  Eyes: Pupils are equal, round, and reactive to light. EOM are normal.  Neck: No JVD present. Carotid bruit is not present.  Cardiovascular: Normal rate, regular rhythm and normal heart sounds.  No murmur heard. Pulmonary/Chest: Effort normal and breath sounds normal. He has no rales.  Musculoskeletal: He exhibits no edema.  Neurological: He is alert and oriented to person, place, and time.  Skin: Skin is warm and dry.  Psychiatric: He has a normal mood and affect.  Vitals reviewed.  Vitals:   11/15/17 1126  BP: 110/70  Pulse: 90  Temp: 98 F (36.7 C)  TempSrc: Oral  SpO2: 98%  Weight: 177 lb (80.3 kg)  Height: 5' 2.5" (1.588 m)   Results for orders placed or performed in visit on 11/15/17  POCT glycosylated  hemoglobin  (Hb A1C)  Result Value Ref Range   Hemoglobin A1C 12.2   POCT glucose (manual entry)  Result Value Ref Range   POC Glucose 325 (A) 70 - 99 mg/dl      Assessment & Plan:    Logan [name] is a 47 y.o. male Type 2 diabetes mellitus with hyperglycemia, without long-term current use of insulin ([place]) - Plan: POCT glycosylated hemoglobin (Hb A1C), POCT glucose (manual entry), blood glucose meter kit and supplies, Insulin Glargine (LANTUS SOLOSTAR) 100 UNIT/ML Solostar Pen  -Uncontrolled, and has remained uncontrolled even with previous treatment.  Discussed concerns based on his age and level of control, and risk for complications from uncontrolled diabetes.  Discussed that current level of A1c I do not expect significant improvement with multiple oral medications.   - Options discussed, ultimately decided on starting Lantus, initially 10 units/day then increase by 2 units every 3 days until readings remain under 200.  Continue metformin.  Hypoglycemic precautions were reviewed.  -Check in 2 weeks with readings to determine next step  Essential hypertension - Plan: Comprehensive metabolic panel, lisinopril (PRINIVIL,ZESTRIL) 10 MG tablet  -Stable, tolerating lisinopril, continue same dose  Hyperlipidemia, unspecified hyperlipidemia type - Plan: Lipid panel, atorvastatin (LIPITOR) 20 MG tablet  -Restart 20 mg lipitor dosing as previously discussed.  Levels pending, but likely will be elevated at lower dose.  Can recheck in the next 6-8 weeks on 20 mg dosing.  Meds ordered this encounter  Medications  . lisinopril (PRINIVIL,ZESTRIL) 10 MG tablet    Sig: Take 1 tablet (10 mg total) by mouth daily.    Dispense:  90 tablet    Refill:  1  . atorvastatin (LIPITOR) 20 MG tablet    Sig: Take 1 tablet (20 mg total) by mouth daily at 6 PM.    Dispense:  90 tablet    Refill:  1  . blood glucose meter kit and supplies    Sig: Dispense based on patient and insurance preference. Use up to 3 times  daily, uncontrolled diabetes.  Contour Next meter with test strips and lancets.    Dispense:  1 each    Refill:  0    Order Specific Question:   Number of strips    Answer:   31    Order Specific Question:   Number of lancets    Answer:   90  . Insulin Glargine (LANTUS SOLOSTAR) 100 UNIT/ML Solostar Pen    Sig: Inject 10 Units into the skin daily. Increase as discussed.    Dispense:  5 pen    Refill:  1   Patient Instructions    I will check cholesterol, but if elevated may be due to lower dose of Lipitor. Restart lipitor '20mg'$  per day.   Blood pressure looks ok. No change in meds today.   Due to level of diabetes, will need to start insulin.  Start with 10 units once per day.  Increase by 2 units every 3 days, until you blood sugars remain below 200. Then stay at that dose once per day.  Check blood sugars 3 times per day.  See precautions on low blood sugar, but I do not expect that to occur. Continue metformin twice per day.,   Recheck in 2 weeks with blood sugar readings to determine other changes.     Insulin Injection Instructions, Using Insulin Pens, Adult A subcutaneous injection is a shot of medicine that is injected into the layer of fat between skin and  muscle. People with type 1 diabetes must take insulin because their bodies do not make it. People with type 2 diabetes may need to take insulin. There are many different types of insulin. The type of insulin that you take may determine how many injections you give yourself and when you need to take the injections. Choosing a site for injection Insulin absorption varies from site to site. It is best to inject insulin within the same body area, using a different spot in that area for each injection. Do not inject the insulin in the same spot for each injection. There are five main areas that can be used for injecting. These areas include:  Abdomen. This is the preferred area.  Front of thigh.  Upper, outer side of  thigh.  Back of upper arm.  Buttocks.  Using an insulin pen First, follow the steps for Getting Ready, then continue with the steps for Injecting the Insulin. Getting Ready 1. Wash your hands with soap and water. If soap and water are not available, use hand sanitizer. 2. Check the expiration date and type of insulin in the pen. 3. If you are using CLEAR insulin, check to see that it is clear and free of clumps. 4. If you are using CLOUDY insulin, gently roll the pen between your palms several times, or tip the pen up and down several times to mix up the medicine. Do not shake the pen. 5. Remove the cap from the insulin pen. 6. Use an alcohol wipe to clean the rubber stopper of the pen cartridge. 7. Remove the protective paper tab from the disposable needle. Do not let the needle touch anything. 8. Screw the needle onto the pen. 9. Remove the outer and inner plastic covers from the needle. Do not throw away the outer plastic cover yet. 10. Prime the insulin pen by turning the button (dial) to 2 units. Hold the pen with the needle pointing up, and push the button on the opposite end of the pen until a drop of insulin appears at the needle tip. If no insulin appears, repeat this step. 11. Dial the number of units of insulin that you will be injecting. Injecting the Insulin  1. Use an alcohol wipe to clean the site where you will be injecting the needle. Let the site air-dry. 2. Hold the pen in the palm of your writing hand with your thumb on the top. 3. If directed by your health care provider, use your other hand to pinch and hold about an inch of skin at the injection site. Do not directly touch the cleaned part of the skin. 4. Gently but quickly, put the needle straight into the skin. The needle should be at a 90-degree angle (perpendicular) to the skin, as if to form the letter "L." ? For example, if you are giving an injection in the abdomen, the abdomen forms one "leg" of the "L" and  the needle forms the other "leg" of the "L." 5. For adults who have a small amount of body fat, the needle may need to be injected at a 45-degree angle instead. Your health care provider will tell you if this is necessary. ? A 45-degree angle looks like the letter "V." 6. When the needle is completely inserted into the skin, use the thumb of your writing hand to push the top button of the pen down all the way to inject the insulin. 7. Let go of the skin that you are pinching. Continue to hold  the pen in place with your writing hand. 8. Wait five seconds, then pull the needle straight out of the skin. 9. Carefully put the larger (outer) plastic cover of the needle back over the needle, then unscrew the capped needle and discard it in a sharps container, such as an empty plastic bottle with a cover. 10. Put the plastic cap back on the insulin pen. Throwing away supplies  Discard all used needles in a puncture-proof sharps disposal container. You can ask your local pharmacy about where you can get this kind of disposal container, or you can use an empty liquid laundry detergent bottle that has a cover.  Follow the disposal regulations for the area where you live. Do not use any needle more than one time.  Throw away empty disposable pens in the regular trash. What questions should I ask my health care provider?  How often should I be taking insulin?  How often should I check my blood glucose?  What amount of insulin should I be taking at each time?  What are the side effects?  What should I do if my blood glucose is too high?  What should I do if my blood glucose is too low?  What should I do if I forget to take my insulin?  What number should I call if I have questions? Where can I get more information?  American Diabetes Association (ADA): www.diabetes.org  American Association of Diabetes Educators (AADE) Patient Resources: https://www.diabeteseducator.org/patient-resources This  information is not intended to replace advice given to you by your health care provider. Make sure you discuss any questions you have with your health care provider. Document Released: 08/30/2015 Document Revised: 01/02/2016 Document Reviewed: 08/30/2015 Elsevier Interactive Patient Education  2018 [employer].   IF you received an x-ray today, you will receive an invoice from [facility]. Please contact [facility] at [phone] with questions or concerns regarding your invoice.   IF you received labwork today, you will receive an invoice from [facility]. Please contact [facility] at [phone] with questions or concerns regarding your invoice.   Our billing staff will not be able to assist you with questions regarding bills from these companies.  You will be contacted with the lab results as soon as they are available. The fastest way to get your results is to activate your My Chart account. Instructions are located on the last page of this paperwork. If you have not heard from [place] regarding the results in 2 weeks, please contact this office.        I personally performed the services described in this documentation, which was scribed in my presence. The recorded information has been reviewed and considered for accuracy and completeness, addended by me as needed, and agree with information above.  Signed,   [name], MD [facility].  11/15/17 12:47 PM

## 2017-11-17 ENCOUNTER — Encounter: Payer: Self-pay | Admitting: Family Medicine

## 2017-11-17 MED ORDER — METFORMIN HCL 1000 MG PO TABS: 1000.0000 mg | ORAL_TABLET | Freq: Two times a day (BID) | ORAL | 1 refills | Status: DC

## 2017-11-22 ENCOUNTER — Encounter: Payer: Self-pay | Admitting: *Deleted

## 2017-11-24 ENCOUNTER — Other Ambulatory Visit: Payer: Self-pay | Admitting: Family Medicine

## 2017-11-30 ENCOUNTER — Encounter: Payer: Self-pay | Admitting: Family Medicine

## 2017-11-30 ENCOUNTER — Ambulatory Visit (INDEPENDENT_AMBULATORY_CARE_PROVIDER_SITE_OTHER): Payer: BLUE CROSS/BLUE SHIELD | Admitting: Family Medicine

## 2017-11-30 VITALS — BP 130/80 | HR 81 | Temp 98.1°F | Resp 17 | Ht 62.5 in | Wt 181.0 lb

## 2017-11-30 DIAGNOSIS — Z794 Long term (current) use of insulin: Secondary | ICD-10-CM

## 2017-11-30 DIAGNOSIS — E1165 Type 2 diabetes mellitus with hyperglycemia: Secondary | ICD-10-CM | POA: Diagnosis not present

## 2017-11-30 NOTE — Patient Instructions (Addendum)
Continue to increase insulin by 2 units every 2 to 3 days until your readings remain below 200.  Follow-up in the next 4 weeks, sooner if needed.  See information below on diabetes self-care including diet recommendations, continue activity/low intensity exercise most days per week.  Watch for low blood sugar symptoms as your insulin dose increases.  I included some information below.  Thank you for coming in today.   Hypoglycemia Hypoglycemia occurs when the level of sugar (glucose) in the blood is too low. Glucose is a type of sugar that provides the body's main source of energy. Certain hormones (insulin and glucagon) control the level of glucose in the blood. Insulin lowers blood glucose, and glucagon increases blood glucose. Hypoglycemia can result from having too much insulin in the bloodstream, or from not eating enough food that contains glucose. Hypoglycemia can happen in people who do or do not have diabetes. It can develop quickly, and it can be a medical emergency. What are the causes? Hypoglycemia occurs most often in people who have diabetes. If you have diabetes, hypoglycemia may be caused by:  Diabetes medicine.  Not eating enough, or not eating often enough.  Increased physical activity.  Drinking alcohol, especially when you have not eaten recently.  If you do not have diabetes, hypoglycemia may be caused by:  A tumor in the pancreas. The pancreas is the organ that makes insulin.  Not eating enough, or not eating for long periods at a time (fasting).  Severe infection or illness that affects the liver, heart, or kidneys.  Certain medicines.  You may also have reactive hypoglycemia. This condition causes hypoglycemia within 4 hours of eating a meal. This may occur after having stomach surgery. Sometimes, the cause of reactive hypoglycemia is not known. What increases the risk? Hypoglycemia is more likely to develop in:  People who have diabetes and take medicines to  lower blood glucose.  People who abuse alcohol.  People who have a severe illness.  What are the signs or symptoms? Hypoglycemia may not cause any symptoms. If you have symptoms, they may include:  Hunger.  Anxiety.  Sweating and feeling clammy.  Confusion.  Dizziness or feeling light-headed.  Sleepiness.  Nausea.  Increased heart rate.  Headache.  Blurry vision.  Seizure.  Nightmares.  Tingling or numbness around the mouth, lips, or tongue.  A change in speech.  Decreased ability to concentrate.  A change in coordination.  Restless sleep.  Tremors or shakes.  Fainting.  Irritability.  How is this diagnosed? Hypoglycemia is diagnosed with a blood test to measure your blood glucose level. This blood test is done while you are having symptoms. Your health care provider may also do a physical exam and review your medical history. If you do not have diabetes, other tests may be done to find the cause of your hypoglycemia. How is this treated? This condition can often be treated by immediately eating or drinking something that contains glucose, such as:  3-4 sugar tablets (glucose pills).  Glucose gel, 15-gram tube.  Fruit juice, 4 oz (120 mL).  Regular soda (not diet soda), 4 oz (120 mL).  Low-fat milk, 4 oz (120 mL).  Several pieces of hard candy.  Sugar or honey, 1 Tbsp.  Treating Hypoglycemia If You Have Diabetes  If you are alert and able to swallow safely, follow the 15:15 rule:  Take 15 grams of a rapid-acting carbohydrate. Rapid-acting options include: ? 1 tube of glucose gel. ? 3 glucose pills. ?  6-8 pieces of hard candy. ? 4 oz (120 mL) of fruit juice. ? 4 oz (120 ml) of regular (not diet) soda.  Check your blood glucose 15 minutes after you take the carbohydrate.  If the repeat blood glucose level is still at or below 70 mg/dL (3.9 mmol/L), take 15 grams of a carbohydrate again.  If your blood glucose level does not increase  above 70 mg/dL (3.9 mmol/L) after 3 tries, seek emergency medical care.  After your blood glucose level returns to normal, eat a meal or a snack within 1 hour.  Treating Severe Hypoglycemia Severe hypoglycemia is when your blood glucose level is at or below 54 mg/dL (3 mmol/L). Severe hypoglycemia is an emergency. Do not wait to see if the symptoms will go away. Get medical help right away. Call your local emergency services (911 in the [place] Do not drive yourself to the hospital. If you have severe hypoglycemia and you cannot eat or drink, you may need an injection of glucagon. A family member or close friend should learn how to check your blood glucose and how to give you a glucagon injection. Ask your health care provider if you need to have an emergency glucagon injection kit available. Severe hypoglycemia may need to be treated in a hospital. The treatment may include getting glucose through an IV tube. You may also need treatment for the cause of your hypoglycemia. Follow these instructions at home: General instructions  Avoid any diets that cause you to not eat enough food. Talk with your health care provider before you start any new diet.  Take over-the-counter and prescription medicines only as told by your health care provider.  Limit alcohol intake to no more than 1 drink per day for nonpregnant women and 2 drinks per day for men. One drink equals 12 oz of beer, 5 oz of wine, or 1 oz of hard liquor.  Keep all follow-up visits as told by your health care provider. This is important. If You Have Diabetes:   Make sure you know the symptoms of hypoglycemia.  Always have a rapid-acting carbohydrate snack with you to treat low blood sugar.  Follow your diabetes management plan, as told by your health care provider. Make sure you: ? Take your medicines as directed. ? Follow your exercise plan. ? Follow your meal plan. Eat on time, and do not skip meals. ? Check your blood glucose  as often as directed. Make sure to check your blood glucose before and after exercise. If you exercise longer or in a different way than usual, check your blood glucose more often. ? Follow your sick day plan whenever you cannot eat or drink normally. Make this plan in advance with your health care provider.  Share your diabetes management plan with people in your workplace, school, and household.  Check your urine for ketones when you are ill and as told by your health care provider.  Carry a medical alert card or wear medical alert jewelry. If You Have Reactive Hypoglycemia or Low Blood Sugar From Other Causes:  Monitor your blood glucose as told by your health care provider.  Follow instructions from your health care provider about eating or drinking restrictions. Contact a health care provider if:  You have problems keeping your blood glucose in your target range.  You have frequent episodes of hypoglycemia. Get help right away if:  You continue to have hypoglycemia symptoms after eating or drinking something containing glucose.  Your blood glucose is at  or below 54 mg/dL (3 mmol/L).  You have a seizure.  You faint. These symptoms may represent a serious problem that is an emergency. Do not wait to see if the symptoms will go away. Get medical help right away. Call your local emergency services (911 in the [place] Do not drive yourself to the hospital. This information is not intended to replace advice given to you by your health care provider. Make sure you discuss any questions you have with your health care provider. Document Released: 07/27/2005 Document Revised: 01/08/2016 Document Reviewed: 08/30/2015 Elsevier Interactive Patient Education  2018 [employer].   Type 2 Diabetes Mellitus, Self Care, Adult When you have type 2 diabetes (type 2 diabetes mellitus), you must keep your blood sugar (glucose) under control. You can do this  with:  Nutrition.  Exercise.  Lifestyle changes.  Medicines or insulin, if needed.  Support from your doctors and others.  How do I manage my blood sugar?  Check your blood sugar level every day, as often as told.  Call your doctor if your blood sugar is above your goal numbers for 2 tests in a row.  Have your A1c (hemoglobin A1c) level checked at least two times a year. Have it checked more often if your doctor tells you to. Your doctor will set treatment goals for you. Generally, you should have these blood sugar levels:  Before meals (preprandial): 80-130 mg/dL (4.4-7.2 mmol/L).  After meals (postprandial): lower than 180 mg/dL (10 mmol/L).  A1c level: less than 7%.  What do I need to know about high blood sugar? High blood sugar is called hyperglycemia. Know the signs of high blood sugar. Signs may include:  Feeling: ? Thirsty. ? Hungry. ? Very tired.  Needing to pee (urinate) more than usual.  Blurry vision.  What do I need to know about low blood sugar? Low blood sugar is called hypoglycemia. This is when blood sugar is at or below 70 mg/dL (3.9 mmol/L). Symptoms may include:  Feeling: ? Hungry. ? Worried or nervous (anxious). ? Sweaty and clammy. ? Confused. ? Dizzy. ? Sleepy. ? Sick to your stomach (nauseous).  Having: ? A fast heartbeat (palpitations). ? A headache. ? A change in your vision. ? Jerky movements that you cannot control (seizure). ? Nightmares. ? Tingling or no feeling (numbness) around the mouth, lips, or tongue.  Having trouble with: ? Talking. ? Paying attention (concentrating). ? Moving (coordination). ? Sleeping.  Shaking.  Passing out (fainting).  Getting upset easily (irritability).  Treating low blood sugar  To treat low blood sugar, eat or drink something sugary right away. If you can think clearly and swallow safely, follow the 15:15 rule:  Take 15 grams of a fast-acting carb (carbohydrate). Some fast-acting  carbs are: ? 1 tube of glucose gel. ? 3 sugar tablets (glucose pills). ? 6-8 pieces of hard candy. ? 4 oz (120 mL) of fruit juice. ? 4 oz (120 mL) regular (not diet) soda.  Check your blood sugar 15 minutes after you take the carb.  If your blood sugar is still at or below 70 mg/dL (3.9 mmol/L), take 15 grams of a carb again.  If your blood sugar does not go above 70 mg/dL (3.9 mmol/L) after 3 tries, get help right away.  After your blood sugar goes back to normal, eat a meal or a snack within 1 hour.  Treating very low blood sugar If your blood sugar is at or below 54 mg/dL (3 mmol/L), you have  very low blood sugar (severe hypoglycemia). This is an emergency. Do not wait to see if the symptoms will go away. Get medical help right away. Call your local emergency services (911 in the [place] Do not drive yourself to the hospital. If you have very low blood sugar and you cannot eat or drink, you may need a glucagon shot (injection). A family member or friend should learn how to check your blood sugar and how to give you a glucagon shot. Ask your doctor if you need to have a glucagon shot kit at home. What else is important to manage my diabetes? Medicine Follow these instructions about insulin and diabetes medicines:  Take them as told by your doctor.  Adjust them as told by your doctor.  Do not run out of them.  Having diabetes can raise your risk for other long-term conditions. These include heart or kidney disease. Your doctor may prescribe medicines to help prevent problems from diabetes. Food   Make healthy food choices. These include: ? Chicken, fish, egg whites, and beans. ? Oats, whole wheat, bulgur, brown rice, quinoa, and millet. ? Fresh fruits and vegetables. ? Low-fat dairy products. ? Nuts, avocado, olive oil, and canola oil.  Make a food plan with a specialist (dietitian).  Follow instructions from your doctor about what you cannot eat or drink.  Drink enough  fluid to keep your pee (urine) clear or pale yellow.  Eat healthy snacks between healthy meals.  Keep track of carbs that you eat. Read food labels. Learn food serving sizes.  Follow your sick day plan when you cannot eat or drink normally. Make this plan with your doctor so it is ready to use. Activity  Exercise at least 3 times a week.  Do not go more than 2 days without exercising.  Talk with your doctor before you start a new exercise. Your doctor may need to adjust your insulin, medicines, or food. Lifestyle   Do not use any tobacco products. These include cigarettes, chewing tobacco, and e-cigarettes.If you need help quitting, ask your doctor.  Ask your doctor how much alcohol is safe for you.  Learn to deal with stress. If you need help with this, ask your doctor. Body care  Stay up to date with your shots (immunizations).  Have your eyes and feet checked by a doctor as often as told.  Check your skin and feet every day. Check for cuts, bruises, redness, blisters, or sores.  Brush your teeth and gums two times a day.  Floss at least one time a day.  Go to the dentist least one time every 6 months.  Stay at a healthy weight. General instructions   Take over-the-counter and prescription medicines only as told by your doctor.  Share your diabetes care plan with: ? Your work or school. ? People you live with.  Check your pee (urine) for ketones: ? When you are sick. ? As told by your doctor.  Carry a card or wear jewelry that says that you have diabetes.  Ask your doctor: ? Do I need to meet with a diabetes educator? ? Where can I find a support group for people with diabetes?  Keep all follow-up visits as told by your doctor. This is important. Where to find more information: To learn more about diabetes, visit:  American Diabetes Association: www.diabetes.org  American Association of Diabetes Educators:  www.diabeteseducator.org/patient-resources  This information is not intended to replace advice given to you by your health care provider.  Make sure you discuss any questions you have with your health care provider. Document Released: 11/18/2015 Document Revised: 01/02/2016 Document Reviewed: 08/30/2015 Elsevier Interactive Patient Education  2018 [employer].    IF you received an x-ray today, you will receive an invoice from [facility]. Please contact [facility] at [phone] with questions or concerns regarding your invoice.   IF you received labwork today, you will receive an invoice from [facility]. Please contact [facility] at [phone] with questions or concerns regarding your invoice.   Our billing staff will not be able to assist you with questions regarding bills from these companies.  You will be contacted with the lab results as soon as they are available. The fastest way to get your results is to activate your My Chart account. Instructions are located on the last page of this paperwork. If you have not heard from [place] regarding the results in 2 weeks, please contact this office.

## 2017-11-30 NOTE — Progress Notes (Signed)
Subjective:  By signing my name below, I, Logan [name], attest that this documentation has been prepared under the direction and in the presence of [name], MD Electronically Signed: Ladene [name], ED Scribe 11/30/2017 at 8:50 AM.   Patient ID: Logan [name], male    DOB: Dec 31, 1970, 47 y.o.   MRN: [id number]  Chief Complaint  Patient presents with  . Diabetes   HPI Logan [name] is a 47 y.o. male who presents to Primary Care at [facility] for f/u on DM. Last seen 4/8. Started on lantus solostar for uncontrolled DM. Initially10 units/day with plan of increasing by 2 units every few days until readings under 200. Continued metformin. BP stable, continued lisinopril. Lipitor was increased back to 20 mg at that visit. Here for f/u of DM and to adjust meds.  Pt reports readings in the high 200s-300s. Reading of 294 this morning but states he ate a large meal around 9/9:30 PM last night since he got home late. Pt has been walking in parks and hiking for exercise a few days/wk. Reports some intermittent L abdominal pain that self-resolves and is not reproducible with palpation. Denies hypoglycemic lows, nausea, vomiting.  Patient Active Problem List   Diagnosis Date Noted  . Acute sinusitis 07/06/2017  . Asthmatic bronchitis 07/06/2017  . Allergic rhinitis with a nonallergic component 12/08/2016  . Psoriasis 12/08/2016  . Diabetes ([place]) 05/31/2012  . Hypertension 05/31/2012   Past Medical History:  Diagnosis Date  . Asthmatic bronchitis 07/06/2017  . Diabetes mellitus    takes Janumet and Invokana daily  . Hernia, umbilical   . Hypertension    takes Lisinopril daily  . Plaque psoriasis   . Seasonal allergies    takes Claritin daily and uses Flonase daily   Past Surgical History:  Procedure Laterality Date  . INSERTION OF MESH N/A 05/07/2016   Procedure: INSERTION OF MESH;  Surgeon: Erroll Luna, MD;  Location: [facility];  Service: General;  Laterality: N/A;  . TYMPANOSTOMY  TUBE PLACEMENT    . VENTRAL HERNIA REPAIR N/A 05/07/2016   Procedure: REPAIR VENTRAL HERNIA;  Surgeon: Erroll Luna, MD;  Location: [facility];  Service: General;  Laterality: N/A;   Allergies  Allergen Reactions  . Penicillins Hives and Rash    Has patient had a PCN reaction causing immediate rash, facial/tongue/throat swelling, SOB or lightheadedness with hypotension: Yes Has patient had a PCN reaction causing severe rash involving mucus membranes or skin necrosis: No Has patient had a PCN reaction that required hospitalization No Has patient had a PCN reaction occurring within the last 10 years: No If all of the above answers are "NO", then may proceed with Cephalosporin use.    Prior to Admission medications   Medication Sig Start Date End Date Taking? Authorizing Provider  albuterol (PROVENTIL HFA;VENTOLIN HFA) 108 (90 Base) MCG/ACT inhaler Inhale 2 puffs into the lungs every 4 (four) hours as needed for wheezing or shortness of breath (cough, shortness of breath or wheezing.). 07/05/17   Posey Boyer, MD  aspirin EC 81 MG tablet Take 81 mg by mouth daily.    [provider]  atorvastatin (LIPITOR) 20 MG tablet Take 1 tablet (20 mg total) by mouth daily at 6 PM. 11/15/17   [name], MD  B Complex-C (B-COMPLEX WITH VITAMIN C) tablet Take 1 tablet by mouth daily.    [provider]  blood glucose meter kit and supplies Dispense based on patient and insurance preference. Use up  to 3 times daily, uncontrolled diabetes.  Contour Next meter with test strips and lancets. 11/15/17   [name], MD  Carbinoxamine Maleate (RYVENT) 6 MG TABS Take 1 tablet by mouth 2 (two) times daily. Patient not taking: Reported on 11/15/2017 07/14/17   Bobbitt, [place] Muta, MD  CONTOUR NEXT TEST test strip TEST UP TO 3 TIMES EVERY DAY 11/25/17   [name], MD  cyclobenzaprine (FLEXERIL) 5 MG tablet 1 pill by mouth up to every 8 hours as needed. Start with one pill by mouth each  bedtime as needed due to sedation 05/04/16   [name], MD  FARXIGA 10 MG TABS tablet TAKE 1 TABLET BY MOUTH EVERY DAY 10/19/17   [name], MD  fluticasone [facility]) 50 MCG/ACT nasal spray Place 2 sprays into both nostrils daily. 07/05/17   Posey Boyer, MD  Insulin Glargine (LANTUS SOLOSTAR) 100 UNIT/ML Solostar Pen Inject 10 Units into the skin daily. Increase as discussed. 11/15/17   [name], MD  Insulin Pen Needle 31G X 5 MM MISC 1 each by Does not apply route daily. 11/15/17   [name], MD  lisinopril (PRINIVIL,ZESTRIL) 10 MG tablet Take 1 tablet (10 mg total) by mouth daily. 11/15/17   [name], MD  loratadine (CLARITIN) 10 MG tablet Take 10 mg by mouth daily.    [provider]  metFORMIN (GLUCOPHAGE) 1000 MG tablet Take 1 tablet (1,000 mg total) by mouth 2 (two) times daily with a meal. 11/17/17   [name], MD  Multiple Vitamin (MULTIVITAMIN WITH MINERALS) TABS tablet Take 1 tablet by mouth daily.    [provider]  vitamin B-12 (CYANOCOBALAMIN) 1000 MCG tablet Take 1,000 mcg by mouth daily.    [provider]  vitamin C (ASCORBIC ACID) 500 MG tablet Take 500 mg by mouth daily.    [provider]   Social History   Socioeconomic History  . Marital status: Single    Spouse name: Not on file  . Number of children: Not on file  . Years of education: Not on file  . Highest education level: Not on file  Occupational History  . Not on file  Social Needs  . Financial resource strain: Not on file  . Food insecurity:    Worry: Not on file    Inability: Not on file  . Transportation needs:    Medical: Not on file    Non-medical: Not on file  Tobacco Use  . Smoking status: Never Smoker  . Smokeless tobacco: Never Used  Substance and Sexual Activity  . Alcohol use: Yes    Comment: occ glass of wine.2-3 per month  . Drug use: No  . Sexual activity: Never  Lifestyle  . Physical activity:    Days per  week: Not on file    Minutes per session: Not on file  . Stress: Not on file  Relationships  . Social connections:    Talks on phone: Not on file    Gets together: Not on file    Attends religious service: Not on file    Active member of club or organization: Not on file    Attends meetings of clubs or organizations: Not on file    Relationship status: Not on file  . Intimate partner violence:    Fear of current or ex partner: Not on file    Emotionally abused: Not on file    Physically abused: Not on file  Forced sexual activity: Not on file  Other Topics Concern  . Not on file  Social History Narrative  . Not on file   Review of Systems  Gastrointestinal: Positive for abdominal pain (intermittent). Negative for nausea and vomiting.      Objective:   Physical Exam  Constitutional: He is oriented to person, place, and time. He appears well-developed and well-nourished. No distress.  HENT:  Head: Normocephalic and atraumatic.  Eyes: Conjunctivae and EOM are normal.  Neck: Neck supple. No tracheal deviation present.  Cardiovascular: Normal rate and regular rhythm.  Pulmonary/Chest: Effort normal and breath sounds normal. No respiratory distress.  Musculoskeletal: Normal range of motion.  Neurological: He is alert and oriented to person, place, and time.  Skin: Skin is warm and dry.  Psychiatric: He has a normal mood and affect. His behavior is normal.  Nursing note and vitals reviewed.  Vitals:   11/30/17 0825  BP: 130/80  Pulse: 81  Resp: 17  Temp: 98.1 F (36.7 C)  TempSrc: Oral  SpO2: 98%  Weight: 181 lb (82.1 kg)  Height: 5' 2.5" (1.588 m)      Assessment & Plan:   Logan [name] is a 47 y.o. male Type 2 diabetes mellitus with hyperglycemia, with long-term current use of insulin ([place])  -Still with elevated readings, but improving.  Continue to increase insulin by 2 units every 2 to 3 days until readings 100.  Follow-up in the next 1 month, can further  titrate dosing at that time.  Continue metformin same dose.  -Regular diet, avoidance of late night meals discussed along with physical activity, low intensity exercise to help manage blood sugar as well as minimize weight gain with insulin.  No orders of the defined types were placed in this encounter.  Patient Instructions   Continue to increase insulin by 2 units every 2 to 3 days until your readings remain below 200.  Follow-up in the next 4 weeks, sooner if needed.  See information below on diabetes self-care including diet recommendations, continue activity/low intensity exercise most days per week.  Watch for low blood sugar symptoms as your insulin dose increases.  I included some information below.  Thank you for coming in today.   Hypoglycemia Hypoglycemia occurs when the level of sugar (glucose) in the blood is too low. Glucose is a type of sugar that provides the body's main source of energy. Certain hormones (insulin and glucagon) control the level of glucose in the blood. Insulin lowers blood glucose, and glucagon increases blood glucose. Hypoglycemia can result from having too much insulin in the bloodstream, or from not eating enough food that contains glucose. Hypoglycemia can happen in people who do or do not have diabetes. It can develop quickly, and it can be a medical emergency. What are the causes? Hypoglycemia occurs most often in people who have diabetes. If you have diabetes, hypoglycemia may be caused by:  Diabetes medicine.  Not eating enough, or not eating often enough.  Increased physical activity.  Drinking alcohol, especially when you have not eaten recently.  If you do not have diabetes, hypoglycemia may be caused by:  A tumor in the pancreas. The pancreas is the organ that makes insulin.  Not eating enough, or not eating for long periods at a time (fasting).  Severe infection or illness that affects the liver, heart, or kidneys.  Certain  medicines.  You may also have reactive hypoglycemia. This condition causes hypoglycemia within 4 hours of eating a meal.  This may occur after having stomach surgery. Sometimes, the cause of reactive hypoglycemia is not known. What increases the risk? Hypoglycemia is more likely to develop in:  People who have diabetes and take medicines to lower blood glucose.  People who abuse alcohol.  People who have a severe illness.  What are the signs or symptoms? Hypoglycemia may not cause any symptoms. If you have symptoms, they may include:  Hunger.  Anxiety.  Sweating and feeling clammy.  Confusion.  Dizziness or feeling light-headed.  Sleepiness.  Nausea.  Increased heart rate.  Headache.  Blurry vision.  Seizure.  Nightmares.  Tingling or numbness around the mouth, lips, or tongue.  A change in speech.  Decreased ability to concentrate.  A change in coordination.  Restless sleep.  Tremors or shakes.  Fainting.  Irritability.  How is this diagnosed? Hypoglycemia is diagnosed with a blood test to measure your blood glucose level. This blood test is done while you are having symptoms. Your health care provider may also do a physical exam and review your medical history. If you do not have diabetes, other tests may be done to find the cause of your hypoglycemia. How is this treated? This condition can often be treated by immediately eating or drinking something that contains glucose, such as:  3-4 sugar tablets (glucose pills).  Glucose gel, 15-gram tube.  Fruit juice, 4 oz (120 mL).  Regular soda (not diet soda), 4 oz (120 mL).  Low-fat milk, 4 oz (120 mL).  Several pieces of hard candy.  Sugar or honey, 1 Tbsp.  Treating Hypoglycemia If You Have Diabetes  If you are alert and able to swallow safely, follow the 15:15 rule:  Take 15 grams of a rapid-acting carbohydrate. Rapid-acting options include: ? 1 tube of glucose gel. ? 3 glucose  pills. ? 6-8 pieces of hard candy. ? 4 oz (120 mL) of fruit juice. ? 4 oz (120 ml) of regular (not diet) soda.  Check your blood glucose 15 minutes after you take the carbohydrate.  If the repeat blood glucose level is still at or below 70 mg/dL (3.9 mmol/L), take 15 grams of a carbohydrate again.  If your blood glucose level does not increase above 70 mg/dL (3.9 mmol/L) after 3 tries, seek emergency medical care.  After your blood glucose level returns to normal, eat a meal or a snack within 1 hour.  Treating Severe Hypoglycemia Severe hypoglycemia is when your blood glucose level is at or below 54 mg/dL (3 mmol/L). Severe hypoglycemia is an emergency. Do not wait to see if the symptoms will go away. Get medical help right away. Call your local emergency services (911 in the [place] Do not drive yourself to the hospital. If you have severe hypoglycemia and you cannot eat or drink, you may need an injection of glucagon. A family member or close friend should learn how to check your blood glucose and how to give you a glucagon injection. Ask your health care provider if you need to have an emergency glucagon injection kit available. Severe hypoglycemia may need to be treated in a hospital. The treatment may include getting glucose through an IV tube. You may also need treatment for the cause of your hypoglycemia. Follow these instructions at home: General instructions  Avoid any diets that cause you to not eat enough food. Talk with your health care provider before you start any new diet.  Take over-the-counter and prescription medicines only as told by your health care provider.  Limit alcohol intake to no more than 1 drink per day for nonpregnant women and 2 drinks per day for men. One drink equals 12 oz of beer, 5 oz of wine, or 1 oz of hard liquor.  Keep all follow-up visits as told by your health care provider. This is important. If You Have Diabetes:   Make sure you know the  symptoms of hypoglycemia.  Always have a rapid-acting carbohydrate snack with you to treat low blood sugar.  Follow your diabetes management plan, as told by your health care provider. Make sure you: ? Take your medicines as directed. ? Follow your exercise plan. ? Follow your meal plan. Eat on time, and do not skip meals. ? Check your blood glucose as often as directed. Make sure to check your blood glucose before and after exercise. If you exercise longer or in a different way than usual, check your blood glucose more often. ? Follow your sick day plan whenever you cannot eat or drink normally. Make this plan in advance with your health care provider.  Share your diabetes management plan with people in your workplace, school, and household.  Check your urine for ketones when you are ill and as told by your health care provider.  Carry a medical alert card or wear medical alert jewelry. If You Have Reactive Hypoglycemia or Low Blood Sugar From Other Causes:  Monitor your blood glucose as told by your health care provider.  Follow instructions from your health care provider about eating or drinking restrictions. Contact a health care provider if:  You have problems keeping your blood glucose in your target range.  You have frequent episodes of hypoglycemia. Get help right away if:  You continue to have hypoglycemia symptoms after eating or drinking something containing glucose.  Your blood glucose is at or below 54 mg/dL (3 mmol/L).  You have a seizure.  You faint. These symptoms may represent a serious problem that is an emergency. Do not wait to see if the symptoms will go away. Get medical help right away. Call your local emergency services (911 in the [place] Do not drive yourself to the hospital. This information is not intended to replace advice given to you by your health care provider. Make sure you discuss any questions you have with your health care provider. Document  Released: 07/27/2005 Document Revised: 01/08/2016 Document Reviewed: 08/30/2015 Elsevier Interactive Patient Education  2018 [employer].   Type 2 Diabetes Mellitus, Self Care, Adult When you have type 2 diabetes (type 2 diabetes mellitus), you must keep your blood sugar (glucose) under control. You can do this with:  Nutrition.  Exercise.  Lifestyle changes.  Medicines or insulin, if needed.  Support from your doctors and others.  How do I manage my blood sugar?  Check your blood sugar level every day, as often as told.  Call your doctor if your blood sugar is above your goal numbers for 2 tests in a row.  Have your A1c (hemoglobin A1c) level checked at least two times a year. Have it checked more often if your doctor tells you to. Your doctor will set treatment goals for you. Generally, you should have these blood sugar levels:  Before meals (preprandial): 80-130 mg/dL (4.4-7.2 mmol/L).  After meals (postprandial): lower than 180 mg/dL (10 mmol/L).  A1c level: less than 7%.  What do I need to know about high blood sugar? High blood sugar is called hyperglycemia. Know the signs of high blood sugar. Signs may include:  Feeling: ? Thirsty. ? Hungry. ? Very tired.  Needing to pee (urinate) more than usual.  Blurry vision.  What do I need to know about low blood sugar? Low blood sugar is called hypoglycemia. This is when blood sugar is at or below 70 mg/dL (3.9 mmol/L). Symptoms may include:  Feeling: ? Hungry. ? Worried or nervous (anxious). ? Sweaty and clammy. ? Confused. ? Dizzy. ? Sleepy. ? Sick to your stomach (nauseous).  Having: ? A fast heartbeat (palpitations). ? A headache. ? A change in your vision. ? Jerky movements that you cannot control (seizure). ? Nightmares. ? Tingling or no feeling (numbness) around the mouth, lips, or tongue.  Having trouble with: ? Talking. ? Paying attention (concentrating). ? Moving  (coordination). ? Sleeping.  Shaking.  Passing out (fainting).  Getting upset easily (irritability).  Treating low blood sugar  To treat low blood sugar, eat or drink something sugary right away. If you can think clearly and swallow safely, follow the 15:15 rule:  Take 15 grams of a fast-acting carb (carbohydrate). Some fast-acting carbs are: ? 1 tube of glucose gel. ? 3 sugar tablets (glucose pills). ? 6-8 pieces of hard candy. ? 4 oz (120 mL) of fruit juice. ? 4 oz (120 mL) regular (not diet) soda.  Check your blood sugar 15 minutes after you take the carb.  If your blood sugar is still at or below 70 mg/dL (3.9 mmol/L), take 15 grams of a carb again.  If your blood sugar does not go above 70 mg/dL (3.9 mmol/L) after 3 tries, get help right away.  After your blood sugar goes back to normal, eat a meal or a snack within 1 hour.  Treating very low blood sugar If your blood sugar is at or below 54 mg/dL (3 mmol/L), you have very low blood sugar (severe hypoglycemia). This is an emergency. Do not wait to see if the symptoms will go away. Get medical help right away. Call your local emergency services (911 in the [place] Do not drive yourself to the hospital. If you have very low blood sugar and you cannot eat or drink, you may need a glucagon shot (injection). A family member or friend should learn how to check your blood sugar and how to give you a glucagon shot. Ask your doctor if you need to have a glucagon shot kit at home. What else is important to manage my diabetes? Medicine Follow these instructions about insulin and diabetes medicines:  Take them as told by your doctor.  Adjust them as told by your doctor.  Do not run out of them.  Having diabetes can raise your risk for other long-term conditions. These include heart or kidney disease. Your doctor may prescribe medicines to help prevent problems from diabetes. Food   Make healthy food choices. These  include: ? Chicken, fish, egg whites, and beans. ? Oats, whole wheat, bulgur, brown rice, quinoa, and millet. ? Fresh fruits and vegetables. ? Low-fat dairy products. ? Nuts, avocado, olive oil, and canola oil.  Make a food plan with a specialist (dietitian).  Follow instructions from your doctor about what you cannot eat or drink.  Drink enough fluid to keep your pee (urine) clear or pale yellow.  Eat healthy snacks between healthy meals.  Keep track of carbs that you eat. Read food labels. Learn food serving sizes.  Follow your sick day plan when you cannot eat or drink normally. Make this plan with your doctor so it is ready  to use. Activity  Exercise at least 3 times a week.  Do not go more than 2 days without exercising.  Talk with your doctor before you start a new exercise. Your doctor may need to adjust your insulin, medicines, or food. Lifestyle   Do not use any tobacco products. These include cigarettes, chewing tobacco, and e-cigarettes.If you need help quitting, ask your doctor.  Ask your doctor how much alcohol is safe for you.  Learn to deal with stress. If you need help with this, ask your doctor. Body care  Stay up to date with your shots (immunizations).  Have your eyes and feet checked by a doctor as often as told.  Check your skin and feet every day. Check for cuts, bruises, redness, blisters, or sores.  Brush your teeth and gums two times a day.  Floss at least one time a day.  Go to the dentist least one time every 6 months.  Stay at a healthy weight. General instructions   Take over-the-counter and prescription medicines only as told by your doctor.  Share your diabetes care plan with: ? Your work or school. ? People you live with.  Check your pee (urine) for ketones: ? When you are sick. ? As told by your doctor.  Carry a card or wear jewelry that says that you have diabetes.  Ask your doctor: ? Do I need to meet with a diabetes  educator? ? Where can I find a support group for people with diabetes?  Keep all follow-up visits as told by your doctor. This is important. Where to find more information: To learn more about diabetes, visit:  American Diabetes Association: www.diabetes.org  American Association of Diabetes Educators: www.diabeteseducator.org/patient-resources  This information is not intended to replace advice given to you by your health care provider. Make sure you discuss any questions you have with your health care provider. Document Released: 11/18/2015 Document Revised: 01/02/2016 Document Reviewed: 08/30/2015 Elsevier Interactive Patient Education  2018 [employer].    IF you received an x-ray today, you will receive an invoice from [facility]. Please contact [facility] at [phone] with questions or concerns regarding your invoice.   IF you received labwork today, you will receive an invoice from [facility]. Please contact [facility] at [phone] with questions or concerns regarding your invoice.   Our billing staff will not be able to assist you with questions regarding bills from these companies.  You will be contacted with the lab results as soon as they are available. The fastest way to get your results is to activate your My Chart account. Instructions are located on the last page of this paperwork. If you have not heard from [place] regarding the results in 2 weeks, please contact this office.       I personally performed the services described in this documentation, which was scribed in my presence. The recorded information has been reviewed and considered for accuracy and completeness, addended by me as needed, and agree with information above.  Signed,   [name], MD [facility].  11/30/17 9:01 AM

## 2017-12-27 ENCOUNTER — Encounter: Payer: Self-pay | Admitting: Family Medicine

## 2017-12-27 ENCOUNTER — Ambulatory Visit (INDEPENDENT_AMBULATORY_CARE_PROVIDER_SITE_OTHER): Payer: BLUE CROSS/BLUE SHIELD | Admitting: Family Medicine

## 2017-12-27 ENCOUNTER — Other Ambulatory Visit: Payer: Self-pay

## 2017-12-27 DIAGNOSIS — E1165 Type 2 diabetes mellitus with hyperglycemia: Secondary | ICD-10-CM | POA: Diagnosis not present

## 2017-12-27 MED ORDER — GLUCOSE BLOOD VI STRP: ORAL_STRIP | 1 refills | Status: DC

## 2017-12-27 MED ORDER — INSULIN GLARGINE 100 UNIT/ML SOLOSTAR PEN: 10.0000 [IU] | PEN_INJECTOR | Freq: Every day | SUBCUTANEOUS | 1 refills | Status: DC

## 2017-12-27 NOTE — Progress Notes (Signed)
Subjective:  By signing my name below, I, Logan [name], attest that this documentation has been prepared under the direction and in the presence of Logan Ray, MD. Electronically Signed: Moises [name], [name]. 12/27/2017 , 12:00 PM .  Patient was seen in Room 10 .   Patient ID: Logan [name], male    DOB: [birth year], 47 y.o.   MRN: [id number] Chief Complaint  Patient presents with  . Diabetes    follow up  and Lab result review    HPI [name] is a 47 y.o. male Here for follow up.   DM Unfortunately, uncontrolled previously, and was started on Lantus on April 8th. He is continued on metformin. At his visit on April 23rd, still having readings in the 200s-300s. Discussed regular diet, and avoid late night meals, as well as activity with low intense exercise. Instructed increasing Lantus dose by 2 units every few days until readings under 200.   Patient states his sugar has been improving, currently at Lantus 34 units. When he eats right, he sees sugars below 200. He reports lowest sugar at 119, before dinner, but primarily in the higher 100s, around 180s. He denies hypoglycemia. He is still taking metformin BID, once in the morning and once in evening.   Lab Results  Component Value Date   HGBA1C 12.2 11/15/2017   Wt Readings from Last 3 Encounters:  12/27/17 179 lb (81.2 kg)  11/30/17 181 lb (82.1 kg)  11/15/17 177 lb (80.3 kg)    Pneumovax: 11/20/14 Foot exam: 11/15/17 Optho: 05/11/17  Hyperlipidemia Lab Results  Component Value Date   CHOL 169 11/15/2017   HDL 40 11/15/2017   LDLCALC 98 11/15/2017   TRIG 156 (H) 11/15/2017   CHOLHDL 4.2 11/15/2017   Lab Results  Component Value Date   ALT 46 (H) 11/15/2017   AST 27 11/15/2017   ALKPHOS 114 11/15/2017   BILITOT 0.7 11/15/2017   Patient was on a lower dose of Lipitor, but now back on Lipitor 27m QD.   Patient Active Problem List   Diagnosis Date Noted  . Acute sinusitis 07/06/2017  . Asthmatic  bronchitis 07/06/2017  . Allergic rhinitis with a nonallergic component 12/08/2016  . Psoriasis 12/08/2016  . Diabetes ([facility] 05/31/2012  . Hypertension 05/31/2012   Past Medical History:  Diagnosis Date  . Asthmatic bronchitis 07/06/2017  . Diabetes mellitus    takes Janumet and Invokana daily  . Hernia, umbilical   . Hypertension    takes Lisinopril daily  . Plaque psoriasis   . Seasonal allergies    takes Claritin daily and uses Flonase daily   Past Surgical History:  Procedure Laterality Date  . INSERTION OF MESH N/A 05/07/2016   Procedure: INSERTION OF MESH;  Surgeon: TErroll Luna MD;  [facility]  Service: General;  Laterality: N/A;  . TYMPANOSTOMY TUBE PLACEMENT    . VENTRAL HERNIA REPAIR N/A 05/07/2016   Procedure: REPAIR VENTRAL HERNIA;  Surgeon: TErroll Luna MD;  [facility]  Service: General;  Laterality: N/A;   Allergies  Allergen Reactions  . Penicillins Hives and Rash    Has patient had a PCN reaction causing immediate rash, facial/tongue/throat swelling, SOB or lightheadedness with hypotension: Yes Has patient had a PCN reaction causing severe rash involving mucus membranes or skin necrosis: No Has patient had a PCN reaction that required hospitalization No Has patient had a PCN reaction occurring within the last 10 years: No If all of the above answers are "NO",  then may proceed with Cephalosporin use.    Prior to Admission medications   Medication Sig Start Date End Date Taking? Authorizing Provider  aspirin EC 81 MG tablet Take 81 mg by mouth daily.   Yes [provider]  atorvastatin (LIPITOR) 20 MG tablet Take 1 tablet (20 mg total) by mouth daily at 6 PM. 11/15/17  Yes [name], MD  B Complex-C (B-COMPLEX WITH VITAMIN C) tablet Take 1 tablet by mouth daily.   Yes [provider]  [name] glucose meter kit and supplies Dispense based on patient and insurance preference. Use up to 3 times daily, uncontrolled diabetes.    Contour Next meter with test strips and lancets. 11/15/17  Yes [name], MD  CONTOUR NEXT TEST test strip TEST UP TO 3 TIMES EVERY DAY 11/25/17  Yes [name], MD  cyclobenzaprine (FLEXERIL) 5 MG tablet 1 pill by mouth up to every 8 hours as needed. Start with one pill by mouth each bedtime as needed due to sedation 05/04/16  Yes [name], MD  fluticasone [facility]) 50 MCG/ACT nasal spray Place 2 sprays into both nostrils daily. 07/05/17  Yes Posey Boyer, MD  Insulin Glargine (LANTUS SOLOSTAR) 100 UNIT/ML Solostar Pen Inject 10 Units into the skin daily. Increase as discussed. 11/15/17  Yes [name], MD  Insulin Pen Needle 31G X 5 MM MISC 1 each by Does not apply route daily. 11/15/17  Yes [name], MD  lisinopril (PRINIVIL,ZESTRIL) 10 MG tablet Take 1 tablet (10 mg total) by mouth daily. 11/15/17  Yes [name], MD  loratadine (CLARITIN) 10 MG tablet Take 10 mg by mouth daily.   Yes [provider]  metFORMIN (GLUCOPHAGE) 1000 MG tablet Take 1 tablet (1,000 mg total) by mouth 2 (two) times daily with a meal. 11/17/17  Yes [name], MD  Multiple Vitamin (MULTIVITAMIN WITH MINERALS) TABS tablet Take 1 tablet by mouth daily.   Yes [provider]  vitamin B-12 (CYANOCOBALAMIN) 1000 MCG tablet Take 1,000 mcg by mouth daily.   Yes [provider]  vitamin C (ASCORBIC ACID) 500 MG tablet Take 500 mg by mouth daily.   Yes [provider]   Social History   Socioeconomic History  . Marital status: Single    Spouse name: Not on file  . Number of children: Not on file  . Years of education: Not on file  . Highest education level: Not on file  Occupational History  . Not on file  Social Needs  . Financial resource strain: Not on file  . Food insecurity:    Worry: Not on file    Inability: Not on file  . Transportation needs:    Medical: Not on file    Non-medical: Not on file  Tobacco Use  . Smoking  status: Never Smoker  . Smokeless tobacco: Never Used  Substance and Sexual Activity  . Alcohol use: Yes    Comment: occ glass of wine.2-3 per month  . Drug use: No  . Sexual activity: Never  Lifestyle  . Physical activity:    Days per week: Not on file    Minutes per session: Not on file  . Stress: Not on file  Relationships  . Social connections:    Talks on phone: Not on file    Gets together: Not on file    Attends religious service: Not on file    Active member of club or organization: Not  on file    Attends meetings of clubs or organizations: Not on file    Relationship status: Not on file  . Intimate partner violence:    Fear of current or ex partner: Not on file    Emotionally abused: Not on file    Physically abused: Not on file    Forced sexual activity: Not on file  Other Topics Concern  . Not on file  Social History Narrative  . Not on file   Review of Systems  Constitutional: Negative for fatigue and unexpected weight change.  Eyes: Negative for visual disturbance.  Respiratory: Negative for cough, chest tightness and shortness of breath.   Cardiovascular: Negative for chest pain, palpitations and leg swelling.  Gastrointestinal: Negative for abdominal pain, [name] in stool, diarrhea, nausea and vomiting.  Neurological: Negative for dizziness, light-headedness and headaches.       Objective:   Physical Exam  Constitutional: He is oriented to person, place, and time. He appears well-developed and well-nourished.  HENT:  Head: Normocephalic and atraumatic.  Eyes: Pupils are equal, round, and reactive to light. EOM are normal.  Neck: No JVD present. Carotid bruit is not present.  Cardiovascular: Normal rate, regular rhythm and normal heart sounds.  No murmur heard. Pulmonary/Chest: Effort normal and breath sounds normal. He has no rales.  Musculoskeletal: He exhibits no edema.  Neurological: He is alert and oriented to person, place, and time.  Skin: Skin  is warm and dry.  Psychiatric: He has a normal mood and affect.  Vitals reviewed.   Vitals:   12/27/17 1120  BP: 118/78  Pulse: 91  Temp: 98.2 F (36.8 C)  TempSrc: Oral  SpO2: 97%  Weight: 179 lb (81.2 kg)  Height: _0  (1.626 m)       Assessment & Plan:    CARLA WHILDEN is a 47 y.o. male Type 2 diabetes mellitus with hyperglycemia, without long-term current use of insulin ([place]) - Plan: Insulin Glargine (LANTUS SOLOSTAR) 100 UNIT/ML Solostar Pen, glucose [name] (CONTOUR NEXT TEST) test strip  -Improving home readings.  Tolerating Lantus.  Denies symptomatic hypoglycemia.   - As he is still working on diet will continue same dose of Lantus and metformin for now with repeat testing in 6 weeks.  Handout given on diabetes and nutrition as well as tips for eating away from home with diabetes.  Meds ordered this encounter  Medications  . Insulin Glargine (LANTUS SOLOSTAR) 100 UNIT/ML Solostar Pen    Sig: Inject 10 Units into the skin daily. Increase as discussed.    Dispense:  15 pen    Refill:  1  . glucose [name] (CONTOUR NEXT TEST) test strip    Sig: TEST UP TO 3 TIMES EVERY DAY    Dispense:  100 each    Refill:  1   Patient Instructions    [name] sugars sound like they are hading the right direction.  Continue lantus at 34 units, and metformin twice per day. Continue to work on diet and we can recheck your A1c and discuss [name] sugar readings.  Here are some [name] sugar guidelines:  Generally, you should have these [name] sugar levels:  Before meals (preprandial): 80-130 mg/dL (4.4-7.2 mmol/L).  After meals (postprandial): lower than 180 mg/dL (10 mmol/L).  A1c level: less than 7%.   Tips for Eating Away From Home If You Have Diabetes Controlling your level of [name] glucose, also known as [name] sugar, can be challenging. It can be even more difficult when  you do not prepare your own meals. The following tips can help you manage your diabetes when you eat away from  home. Planning ahead Plan ahead if you know you will be eating away from home:  Ask your health care provider how to time meals and medicine if you are taking insulin.  Make a list of restaurants near you that offer healthy choices. If they have a carry-out menu, take it home and plan what you will order ahead of time.  Look up the restaurant you want to eat at online. Many chain and fast-food restaurants list nutritional information online. Use this information to choose the healthiest options and to calculate how many carbohydrates will be in your meal.  Use a carbohydrate-counting book or mobile app to look up the carbohydrate content and serving size of the foods you want to eat.  Become familiar with serving sizes and learn to recognize how many servings are in a portion. This will allow you to estimate how many carbohydrates you can eat.  Free foods A "free food" is any food or drink that has less than 5 g of carbohydrates per serving. Free foods include:  Many vegetables.  Hard boiled eggs.  Nuts or seeds.  Olives.  Cheeses.  Meats.  These types of foods make good appetizer choices and are often available at salad bars. Lemon juice, vinegar, or a low-calorie salad dressing of fewer than 20 calories per serving can be used as a "free" salad dressing. Choices to reduce carbohydrates  Substitute nonfat sweetened yogurt with a sugar-free yogurt. Yogurt made from soy milk may also be used, but you will still want a sugar-free or plain option to choose a lower carbohydrate amount.  Ask your server to take away the bread basket or chips from your table.  Order fresh fruit. A salad bar often offers fresh fruit choices. Avoid canned fruit because it is usually packed in sugar or syrup.  Order a salad, and eat it without dressing. Or, create a "free" salad dressing.  Ask for substitutions. For example, instead of [place] fries, request an order of a vegetable such as salad, green  beans, or broccoli. Other tips  If you take insulin, take the insulin once your food arrives to your table. This will ensure your insulin and food are timed correctly.  Ask your server about the portion size before your order, and ask for a take-out box if the portion has more servings than you should have. When your food comes, leave the amount you should have on the plate, and put the rest in the take-out box.  Consider splitting an entree with someone and ordering a side salad. This information is not intended to replace advice given to you by your health care provider. Make sure you discuss any questions you have with your health care provider. Document Released: 07/27/2005 Document Revised: 01/02/2016 Document Reviewed: 10/24/2013 Elsevier Interactive Patient Education  2018 [employer].   Diabetes Mellitus and Nutrition When you have diabetes (diabetes mellitus), it is very important to have healthy eating habits because your [name] sugar (glucose) levels are greatly affected by what you eat and drink. Eating healthy foods in the appropriate amounts, at about the same times every day, can help you:  Control your [name] glucose.  Lower your risk of heart disease.  Improve your [name] pressure.  Reach or maintain a healthy weight.  Every person with diabetes is different, and each person has different needs for a meal plan. Your  health care provider may recommend that you work with a diet and nutrition specialist (dietitian) to make a meal plan that is best for you. Your meal plan may vary depending on factors such as:  The calories you need.  The medicines you take.  Your weight.  Your [name] glucose, [name] pressure, and cholesterol levels.  Your activity level.  Other health conditions you have, such as heart or kidney disease.  How do carbohydrates affect me? Carbohydrates affect your [name] glucose level more than any other type of food. Eating carbohydrates naturally  increases the amount of glucose in your [name]. Carbohydrate counting is a method for keeping track of how many carbohydrates you eat. Counting carbohydrates is important to keep your [name] glucose at a healthy level, especially if you use insulin or take certain oral diabetes medicines. It is important to know how many carbohydrates you can safely have in each meal. This is different for every person. Your dietitian can help you calculate how many carbohydrates you should have at each meal and for snack. Foods that contain carbohydrates include:  Bread, cereal, rice, pasta, and crackers.  Potatoes and corn.  Peas, beans, and lentils.  Milk and yogurt.  Fruit and juice.  Desserts, such as cakes, cookies, ice cream, and candy.  How does alcohol affect me? Alcohol can cause a sudden decrease in [name] glucose (hypoglycemia), especially if you use insulin or take certain oral diabetes medicines. Hypoglycemia can be a life-threatening condition. Symptoms of hypoglycemia (sleepiness, dizziness, and confusion) are similar to symptoms of having too much alcohol. If your health care provider says that alcohol is safe for you, follow these guidelines:  Limit alcohol intake to no more than 1 drink per day for nonpregnant women and 2 drinks per day for men. One drink equals 12 oz of beer, 5 oz of wine, or 1 oz of hard liquor.  Do not drink on an empty stomach.  Keep yourself hydrated with water, diet soda, or unsweetened iced tea.  Keep in mind that regular soda, juice, and other mixers may contain a lot of sugar and must be counted as carbohydrates.  What are tips for following this plan? Reading food labels  Start by checking the serving size on the label. The amount of calories, carbohydrates, fats, and other nutrients listed on the label are based on one serving of the food. Many foods contain more than one serving per package.  Check the total grams (g) of carbohydrates in one serving. You  can calculate the number of servings of carbohydrates in one serving by dividing the total carbohydrates by 15. For example, if a food has 30 g of total carbohydrates, it would be equal to 2 servings of carbohydrates.  Check the number of grams (g) of saturated and trans fats in one serving. Choose foods that have low or no amount of these fats.  Check the number of milligrams (mg) of sodium in one serving. Most people should limit total sodium intake to less than 2,300 mg per day.  Always check the nutrition information of foods labeled as "low-fat" or "nonfat". These foods may be higher in added sugar or refined carbohydrates and should be avoided.  Talk to your dietitian to identify your daily goals for nutrients listed on the label. Shopping  Avoid buying canned, premade, or processed foods. These foods tend to be high in fat, sodium, and added sugar.  Shop around the outside edge of the grocery store. This includes fresh fruits and  vegetables, bulk grains, fresh meats, and fresh dairy. Cooking  Use low-heat cooking methods, such as baking, instead of high-heat cooking methods like deep frying.  Cook using healthy oils, such as olive, canola, or sunflower oil.  Avoid cooking with butter, cream, or high-fat meats. Meal planning  Eat meals and snacks regularly, preferably at the same times every day. Avoid going long periods of time without eating.  Eat foods high in fiber, such as fresh fruits, vegetables, beans, and whole grains. Talk to your dietitian about how many servings of carbohydrates you can eat at each meal.  Eat 4-6 ounces of lean protein each day, such as lean meat, chicken, fish, eggs, or tofu. 1 ounce is equal to 1 ounce of meat, chicken, or fish, 1 egg, or 1/4 cup of tofu.  Eat some foods each day that contain healthy fats, such as avocado, nuts, seeds, and fish. Lifestyle   Check your [name] glucose regularly.  Exercise at least 30 minutes 5 or more days each  week, or as told by your health care provider.  Take medicines as told by your health care provider.  Do not use any products that contain nicotine or tobacco, such as cigarettes and e-cigarettes. If you need help quitting, ask your health care provider.  Work with a Social worker or diabetes educator to identify strategies to manage stress and any emotional and social challenges. What are some questions to ask my health care provider?  Do I need to meet with a diabetes educator?  Do I need to meet with a dietitian?  What number can I call if I have questions?  When are the best times to check my [name] glucose? Where to find more information:  American Diabetes Association: diabetes.org/food-and-fitness/food  Academy of Nutrition and Dietetics: PokerClues.dk  [employer] of Diabetes and Digestive and Kidney Diseases (NIH): ContactWire.be Summary  A healthy meal plan will help you control your [name] glucose and maintain a healthy lifestyle.  Working with a diet and nutrition specialist (dietitian) can help you make a meal plan that is best for you.  Keep in mind that carbohydrates and alcohol have immediate effects on your [name] glucose levels. It is important to count carbohydrates and to use alcohol carefully. This information is not intended to replace advice given to you by your health care provider. Make sure you discuss any questions you have with your health care provider. Document Released: 04/23/2005 Document Revised: 08/31/2016 Document Reviewed: 08/31/2016 Elsevier Interactive Patient Education  2018 [employer].      IF you received an x-[name] today, you will receive an invoice from [facility]. Please contact [facility] at [phone] with questions or concerns regarding your invoice.   IF you received labwork today, you  will receive an invoice from [facility] [facility]. Please contact [facility] at [phone] with questions or concerns regarding your invoice.   Our billing staff will not be able to assist you with questions regarding bills from these companies.  You will be contacted with the lab results as soon as they are available. The fastest way to get your results is to activate your My Chart account. Instructions are located on the last page of this paperwork. If you have not heard from [place] regarding the results in 2 weeks, please contact this office.       I personally performed the services described in this documentation, which was scribed in my presence. The recorded information has been reviewed and considered for accuracy and completeness, addended by me  as needed, and agree with information above.  Signed,   Logan Ray, MD [facility] [place].  12/27/17 2:04 PM

## 2017-12-27 NOTE — Patient Instructions (Addendum)
Blood sugars sound like they are hading the right direction.  Continue lantus at 34 units, and metformin twice per day. Continue to work on diet and we can recheck your A1c and discuss blood sugar readings.  Here are some blood sugar guidelines:  Generally, you should have these blood sugar levels:  Before meals (preprandial): 80-130 mg/dL (4.4-7.2 mmol/L).  After meals (postprandial): lower than 180 mg/dL (10 mmol/L).  A1c level: less than 7%.   Tips for Eating Away From Home If You Have Diabetes Controlling your level of blood glucose, also known as blood sugar, can be challenging. It can be even more difficult when you do not prepare your own meals. The following tips can help you manage your diabetes when you eat away from home. Planning ahead Plan ahead if you know you will be eating away from home:  Ask your health care provider how to time meals and medicine if you are taking insulin.  Make a list of restaurants near you that offer healthy choices. If they have a carry-out menu, take it home and plan what you will order ahead of time.  Look up the restaurant you want to eat at online. Many chain and fast-food restaurants list nutritional information online. Use this information to choose the healthiest options and to calculate how many carbohydrates will be in your meal.  Use a carbohydrate-counting book or mobile app to look up the carbohydrate content and serving size of the foods you want to eat.  Become familiar with serving sizes and learn to recognize how many servings are in a portion. This will allow you to estimate how many carbohydrates you can eat.  Free foods A "free food" is any food or drink that has less than 5 g of carbohydrates per serving. Free foods include:  Many vegetables.  Hard boiled eggs.  Nuts or seeds.  Olives.  Cheeses.  Meats.  These types of foods make good appetizer choices and are often available at salad bars. Lemon juice, vinegar, or  a low-calorie salad dressing of fewer than 20 calories per serving can be used as a "free" salad dressing. Choices to reduce carbohydrates  Substitute nonfat sweetened yogurt with a sugar-free yogurt. Yogurt made from soy milk may also be used, but you will still want a sugar-free or plain option to choose a lower carbohydrate amount.  Ask your server to take away the bread basket or chips from your table.  Order fresh fruit. A salad bar often offers fresh fruit choices. Avoid canned fruit because it is usually packed in sugar or syrup.  Order a salad, and eat it without dressing. Or, create a "free" salad dressing.  Ask for substitutions. For example, instead of [place] fries, request an order of a vegetable such as salad, green beans, or broccoli. Other tips  If you take insulin, take the insulin once your food arrives to your table. This will ensure your insulin and food are timed correctly.  Ask your server about the portion size before your order, and ask for a take-out box if the portion has more servings than you should have. When your food comes, leave the amount you should have on the plate, and put the rest in the take-out box.  Consider splitting an entree with someone and ordering a side salad. This information is not intended to replace advice given to you by your health care provider. Make sure you discuss any questions you have with your health care provider. Document Released: 07/27/2005  Document Revised: 01/02/2016 Document Reviewed: 10/24/2013 Elsevier Interactive Patient Education  2018 [employer].   Diabetes Mellitus and Nutrition When you have diabetes (diabetes mellitus), it is very important to have healthy eating habits because your blood sugar (glucose) levels are greatly affected by what you eat and drink. Eating healthy foods in the appropriate amounts, at about the same times every day, can help you:  Control your blood glucose.  Lower your risk of heart  disease.  Improve your blood pressure.  Reach or maintain a healthy weight.  Every person with diabetes is different, and each person has different needs for a meal plan. Your health care provider may recommend that you work with a diet and nutrition specialist (dietitian) to make a meal plan that is best for you. Your meal plan may vary depending on factors such as:  The calories you need.  The medicines you take.  Your weight.  Your blood glucose, blood pressure, and cholesterol levels.  Your activity level.  Other health conditions you have, such as heart or kidney disease.  How do carbohydrates affect me? Carbohydrates affect your blood glucose level more than any other type of food. Eating carbohydrates naturally increases the amount of glucose in your blood. Carbohydrate counting is a method for keeping track of how many carbohydrates you eat. Counting carbohydrates is important to keep your blood glucose at a healthy level, especially if you use insulin or take certain oral diabetes medicines. It is important to know how many carbohydrates you can safely have in each meal. This is different for every person. Your dietitian can help you calculate how many carbohydrates you should have at each meal and for snack. Foods that contain carbohydrates include:  Bread, cereal, rice, pasta, and crackers.  Potatoes and corn.  Peas, beans, and lentils.  Milk and yogurt.  Fruit and juice.  Desserts, such as cakes, cookies, ice cream, and candy.  How does alcohol affect me? Alcohol can cause a sudden decrease in blood glucose (hypoglycemia), especially if you use insulin or take certain oral diabetes medicines. Hypoglycemia can be a life-threatening condition. Symptoms of hypoglycemia (sleepiness, dizziness, and confusion) are similar to symptoms of having too much alcohol. If your health care provider says that alcohol is safe for you, follow these guidelines:  Limit alcohol intake  to no more than 1 drink per day for nonpregnant women and 2 drinks per day for men. One drink equals 12 oz of beer, 5 oz of wine, or 1 oz of hard liquor.  Do not drink on an empty stomach.  Keep yourself hydrated with water, diet soda, or unsweetened iced tea.  Keep in mind that regular soda, juice, and other mixers may contain a lot of sugar and must be counted as carbohydrates.  What are tips for following this plan? Reading food labels  Start by checking the serving size on the label. The amount of calories, carbohydrates, fats, and other nutrients listed on the label are based on one serving of the food. Many foods contain more than one serving per package.  Check the total grams (g) of carbohydrates in one serving. You can calculate the number of servings of carbohydrates in one serving by dividing the total carbohydrates by 15. For example, if a food has 30 g of total carbohydrates, it would be equal to 2 servings of carbohydrates.  Check the number of grams (g) of saturated and trans fats in one serving. Choose foods that have low or no amount  of these fats.  Check the number of milligrams (mg) of sodium in one serving. Most people should limit total sodium intake to less than 2,300 mg per day.  Always check the nutrition information of foods labeled as "low-fat" or "nonfat". These foods may be higher in added sugar or refined carbohydrates and should be avoided.  Talk to your dietitian to identify your daily goals for nutrients listed on the label. Shopping  Avoid buying canned, premade, or processed foods. These foods tend to be high in fat, sodium, and added sugar.  Shop around the outside edge of the grocery store. This includes fresh fruits and vegetables, bulk grains, fresh meats, and fresh dairy. Cooking  Use low-heat cooking methods, such as baking, instead of high-heat cooking methods like deep frying.  Cook using healthy oils, such as olive, canola, or sunflower  oil.  Avoid cooking with butter, cream, or high-fat meats. Meal planning  Eat meals and snacks regularly, preferably at the same times every day. Avoid going long periods of time without eating.  Eat foods high in fiber, such as fresh fruits, vegetables, beans, and whole grains. Talk to your dietitian about how many servings of carbohydrates you can eat at each meal.  Eat 4-6 ounces of lean protein each day, such as lean meat, chicken, fish, eggs, or tofu. 1 ounce is equal to 1 ounce of meat, chicken, or fish, 1 egg, or 1/4 cup of tofu.  Eat some foods each day that contain healthy fats, such as avocado, nuts, seeds, and fish. Lifestyle   Check your blood glucose regularly.  Exercise at least 30 minutes 5 or more days each week, or as told by your health care provider.  Take medicines as told by your health care provider.  Do not use any products that contain nicotine or tobacco, such as cigarettes and e-cigarettes. If you need help quitting, ask your health care provider.  Work with a Social worker or diabetes educator to identify strategies to manage stress and any emotional and social challenges. What are some questions to ask my health care provider?  Do I need to meet with a diabetes educator?  Do I need to meet with a dietitian?  What number can I call if I have questions?  When are the best times to check my blood glucose? Where to find more information:  American Diabetes Association: diabetes.org/food-and-fitness/food  Academy of Nutrition and Dietetics: PokerClues.dk  [employer] of Diabetes and Digestive and Kidney Diseases (NIH): ContactWire.be Summary  A healthy meal plan will help you control your blood glucose and maintain a healthy lifestyle.  Working with a diet and nutrition specialist (dietitian) can help you make a meal plan that  is best for you.  Keep in mind that carbohydrates and alcohol have immediate effects on your blood glucose levels. It is important to count carbohydrates and to use alcohol carefully. This information is not intended to replace advice given to you by your health care provider. Make sure you discuss any questions you have with your health care provider. Document Released: 04/23/2005 Document Revised: 08/31/2016 Document Reviewed: 08/31/2016 Elsevier Interactive Patient Education  2018 [employer].      IF you received an x-ray today, you will receive an invoice from [facility]. Please contact [facility] at [phone] with questions or concerns regarding your invoice.   IF you received labwork today, you will receive an invoice from [place]. Please contact [facility] at [phone] with questions or concerns regarding your invoice.  Our billing staff will not be able to assist you with questions regarding bills from these companies.  You will be contacted with the lab results as soon as they are available. The fastest way to get your results is to activate your My Chart account. Instructions are located on the last page of this paperwork. If you have not heard from [place] regarding the results in 2 weeks, please contact this office.

## 2018-01-10 ENCOUNTER — Other Ambulatory Visit: Payer: Self-pay | Admitting: Family Medicine

## 2018-02-07 ENCOUNTER — Telehealth: Payer: Self-pay | Admitting: Family Medicine

## 2018-02-07 DIAGNOSIS — E1165 Type 2 diabetes mellitus with hyperglycemia: Secondary | ICD-10-CM

## 2018-02-07 MED ORDER — METFORMIN HCL 1000 MG PO TABS: 1000.0000 mg | ORAL_TABLET | Freq: Two times a day (BID) | ORAL | 1 refills | Status: DC

## 2018-02-07 MED ORDER — INSULIN GLARGINE 100 UNIT/ML SOLOSTAR PEN: 34.0000 [IU] | PEN_INJECTOR | Freq: Every day | SUBCUTANEOUS | 1 refills | Status: DC

## 2018-02-07 NOTE — Telephone Encounter (Signed)
TC from patient requesting his number of Lantus insulin units be corrected with his pharmacy. 10 units phoned in but needs to read 34 units. According to [name]'s note to patient on 12/27/17, patient should be using 34 units of Lantus glargine daily. Patient also requesting refill on metformin 1000 MG tab taken 2 times daily.  LOV 12/27/17 Metformin last ordered 11/17/17 # 90 tabs 1 refill.  At [facility]

## 2018-02-07 NOTE — Telephone Encounter (Signed)
Copied from [place] [id number]. Topic: Quick Communication - See Telephone Encounter >> Feb 07, 2018 10:27 AM Antonieta Iba C wrote: CRM for notification. See Telephone encounter for: 02/07/18.  Pt says per he and providers discussion the incorrect Rx for Insulin Glargine (LANTUS SOLOSTAR) 100 UNIT/ML Solostar Pen  was sent in. Pt would like to have this updated with pharmacy.    Pharmacy: [id number] [name], [place] (Phone) [phone]

## 2018-02-09 ENCOUNTER — Other Ambulatory Visit: Payer: Self-pay | Admitting: Family Medicine

## 2018-02-09 DIAGNOSIS — E1165 Type 2 diabetes mellitus with hyperglycemia: Secondary | ICD-10-CM

## 2018-02-21 ENCOUNTER — Ambulatory Visit: Payer: BLUE CROSS/BLUE SHIELD | Admitting: Family Medicine

## 2018-03-07 ENCOUNTER — Other Ambulatory Visit: Payer: Self-pay | Admitting: Family Medicine

## 2018-03-09 ENCOUNTER — Other Ambulatory Visit: Payer: Self-pay | Admitting: Family Medicine

## 2018-03-09 DIAGNOSIS — E785 Hyperlipidemia, unspecified: Secondary | ICD-10-CM

## 2018-03-10 NOTE — Telephone Encounter (Signed)
Lipitor 20 mg refill request  Has appt coming up on 03/14/18 at 9:40 with Dr. Carlota Raspberry.   Refill during OV in case of changes?  [facility] [place], [place]

## 2018-03-14 ENCOUNTER — Ambulatory Visit (INDEPENDENT_AMBULATORY_CARE_PROVIDER_SITE_OTHER): Payer: BLUE CROSS/BLUE SHIELD | Admitting: Family Medicine

## 2018-03-14 ENCOUNTER — Other Ambulatory Visit: Payer: Self-pay

## 2018-03-14 ENCOUNTER — Encounter: Payer: Self-pay | Admitting: Family Medicine

## 2018-03-14 VITALS — BP 122/82 | HR 86 | Temp 98.4°F | Ht 64.0 in | Wt 181.2 lb

## 2018-03-14 DIAGNOSIS — I10 Essential (primary) hypertension: Secondary | ICD-10-CM | POA: Diagnosis not present

## 2018-03-14 DIAGNOSIS — E785 Hyperlipidemia, unspecified: Secondary | ICD-10-CM | POA: Diagnosis not present

## 2018-03-14 DIAGNOSIS — E1165 Type 2 diabetes mellitus with hyperglycemia: Secondary | ICD-10-CM | POA: Diagnosis not present

## 2018-03-14 LAB — GLUCOSE, POCT (MANUAL RESULT ENTRY): POC Glucose: 219 mg/dl — AB (ref 70–99)

## 2018-03-14 MED ORDER — INSULIN GLARGINE 100 UNIT/ML SOLOSTAR PEN: 36.0000 [IU] | PEN_INJECTOR | Freq: Every day | SUBCUTANEOUS | 1 refills | Status: DC

## 2018-03-14 MED ORDER — ATORVASTATIN CALCIUM 20 MG PO TABS: 20.0000 mg | ORAL_TABLET | Freq: Every day | ORAL | 1 refills | Status: DC

## 2018-03-14 MED ORDER — METFORMIN HCL 1000 MG PO TABS: 1000.0000 mg | ORAL_TABLET | Freq: Two times a day (BID) | ORAL | 1 refills | Status: DC

## 2018-03-14 MED ORDER — LISINOPRIL 10 MG PO TABS: 10.0000 mg | ORAL_TABLET | Freq: Every day | ORAL | 1 refills | Status: DC

## 2018-03-14 NOTE — Progress Notes (Signed)
Subjective:    Patient ID: Logan [name], male    DOB: [birth year], 47 y.o.   MRN: [id number]  HPI [name] is a 47 y.o. male Presents today for: Chief Complaint  Patient presents with  . [place] Conditions    3 m f/u  (Bp, diabetes,& cholesterol)  . Medication Refill    all meds (including blood sugar meter supplies)  Here for follow-up of diabetes and other concerns.:  Diabetes: Lab Results  Component Value Date   HGBA1C 12.2 11/15/2017   Wt Readings from Last 3 Encounters:  03/14/18 181 lb 3.2 oz (82.2 kg)  12/27/17 179 lb (81.2 kg)  11/30/17 181 lb (82.1 kg)  Continue metformin, started Lantus in April.  He is up to 34 units at bedtime.  Still having some readings in the 200s with occasional 300s, but has had low readings of 138 and 158 on occasion.  Feels like a late night eating may be affecting his blood sugars as well as dietary indiscretion.  No new side effects, no symptomatic lows.  Does note some occasional tingling or sensation in different areas of his body with slight soreness that comes and goes.  Notes this is in fingers and toes at times as well.  Not persistent.  Urine microalbumin: 02/16/2017, 22.1 Optho: 05/11/17 Foot exam: 11/15/2017 Statin -takes Lipitor Ace or ARB -takes lisinopril  Hypertension: BP Readings from Last 3 Encounters:  03/14/18 122/82  12/27/17 118/78  11/30/17 130/80   Lab Results  Component Value Date   CREATININE 0.83 11/15/2017  Takes lisinopril 10 mg daily.  Denies cough or any new side effects  Hyperlipidemia:  Lab Results  Component Value Date   CHOL 169 11/15/2017   HDL 40 11/15/2017   LDLCALC 98 11/15/2017   TRIG 156 (H) 11/15/2017   CHOLHDL 4.2 11/15/2017   Lab Results  Component Value Date   ALT 46 (H) 11/15/2017   AST 27 11/15/2017   ALKPHOS 114 11/15/2017   BILITOT 0.7 11/15/2017  Takes Lipitor, denies any myalgias.  He is fasting today.  Mole on leg.: Has had a mole on his lower left calf for many  years, feels like it may be darker recently but does not report any increase in size.  Has seen [facility] dermatology in the past but has not been evaluated for this area.  Pulling sensation in abdomen.  Denies pain but does have a pulling sensation that radiates to the groin at times.  History of ventral hernia repair in 2017, Dr. Brantley Stage no n/v diarrhea. Not painful, just pull sensation  When bending over.   Patient Active Problem List   Diagnosis Date Noted  . Acute sinusitis 07/06/2017  . Asthmatic bronchitis 07/06/2017  . Allergic rhinitis with a nonallergic component 12/08/2016  . Psoriasis 12/08/2016  . Diabetes ([place]) 05/31/2012  . Hypertension 05/31/2012   Past Medical History:  Diagnosis Date  . Asthmatic bronchitis 07/06/2017  . Diabetes mellitus    takes Janumet and Invokana daily  . Hernia, umbilical   . Hypertension    takes Lisinopril daily  . Plaque psoriasis   . Seasonal allergies    takes Claritin daily and uses Flonase daily   Past Surgical History:  Procedure Laterality Date  . INSERTION OF MESH N/A 05/07/2016   Procedure: INSERTION OF MESH;  Surgeon: Erroll Luna, MD;  Location: [place];  Service: General;  Laterality: N/A;  . TYMPANOSTOMY TUBE PLACEMENT    . VENTRAL HERNIA REPAIR N/A 05/07/2016  Procedure: REPAIR VENTRAL HERNIA;  Surgeon: Erroll Luna, MD;  Location: [facility];  Service: General;  Laterality: N/A;   Allergies  Allergen Reactions  . Penicillins Hives and Rash    Has patient had a PCN reaction causing immediate rash, facial/tongue/throat swelling, SOB or lightheadedness with hypotension: Yes Has patient had a PCN reaction causing severe rash involving mucus membranes or skin necrosis: No Has patient had a PCN reaction that required hospitalization No Has patient had a PCN reaction occurring within the last 10 years: No If all of the above answers are "NO", then may proceed with Cephalosporin use.    Prior to Admission medications   Medication  Sig Start Date End Date Taking? Authorizing Provider  aspirin EC 81 MG tablet Take 81 mg by mouth daily.   Yes [provider]  atorvastatin (LIPITOR) 20 MG tablet Take 1 tablet (20 mg total) by mouth daily at 6 PM. 11/15/17  Yes [name], MD  B Complex-C (B-COMPLEX WITH VITAMIN C) tablet Take 1 tablet by mouth daily.   Yes [provider]  B-D UF III MINI PEN NEEDLES 31G X 5 MM MISC USE AS DIRECTED DAILY 02/09/18  Yes [name], MD  BAYER MICROLET LANCETS lancets TEST UP TO 3 TIMES A DAY 01/11/18  Yes [name], MD  blood glucose meter kit and supplies Dispense based on patient and insurance preference. Use up to 3 times daily, uncontrolled diabetes.  Contour Next meter with test strips and lancets. 11/15/17  Yes [name], MD  CONTOUR NEXT TEST test strip TEST UP TO 3 TIMES EVERY DAY 03/09/18  Yes [name], MD  cyclobenzaprine (FLEXERIL) 5 MG tablet 1 pill by mouth up to every 8 hours as needed. Start with one pill by mouth each bedtime as needed due to sedation 05/04/16  Yes [name], MD  fluticasone [facility]) 50 MCG/ACT nasal spray Place 2 sprays into both nostrils daily. 07/05/17  Yes Posey Boyer, MD  Insulin Glargine (LANTUS SOLOSTAR) 100 UNIT/ML Solostar Pen Inject 34 Units into the skin daily. 34 units daily. 02/07/18  Yes [name], MD  lisinopril (PRINIVIL,ZESTRIL) 10 MG tablet Take 1 tablet (10 mg total) by mouth daily. 11/15/17  Yes [name], MD  loratadine (CLARITIN) 10 MG tablet Take 10 mg by mouth daily.   Yes [provider]  metFORMIN (GLUCOPHAGE) 1000 MG tablet Take 1 tablet (1,000 mg total) by mouth 2 (two) times daily with a meal. 02/07/18  Yes [name], MD  Multiple Vitamin (MULTIVITAMIN WITH MINERALS) TABS tablet Take 1 tablet by mouth daily.   Yes [provider]  vitamin B-12 (CYANOCOBALAMIN) 1000 MCG tablet Take 1,000 mcg by mouth daily.   Yes [provider]    vitamin C (ASCORBIC ACID) 500 MG tablet Take 500 mg by mouth daily.   Yes [provider]   Social History   Socioeconomic History  . Marital status: Single    Spouse name: Not on file  . Number of children: Not on file  . Years of education: Not on file  . Highest education level: Not on file  Occupational History  . Not on file  Social Needs  . Financial resource strain: Not on file  . Food insecurity:    Worry: Not on file    Inability: Not on file  . Transportation needs:    Medical: Not on file    Non-medical: Not on file  Tobacco Use  . Smoking status: Never Smoker  . Smokeless tobacco: Never Used  Substance and Sexual Activity  . Alcohol use: Yes    Comment: occ glass of wine.2-3 per month  . Drug use: No  . Sexual activity: Never  Lifestyle  . Physical activity:    Days per week: Not on file    Minutes per session: Not on file  . Stress: Not on file  Relationships  . Social connections:    Talks on phone: Not on file    Gets together: Not on file    Attends religious service: Not on file    Active member of club or organization: Not on file    Attends meetings of clubs or organizations: Not on file    Relationship status: Not on file  . Intimate partner violence:    Fear of current or ex partner: Not on file    Emotionally abused: Not on file    Physically abused: Not on file    Forced sexual activity: Not on file  Other Topics Concern  . Not on file  Social History Narrative  . Not on file     Review of Systems  Constitutional: Negative for fatigue and unexpected weight change.  Eyes: Negative for visual disturbance.  Respiratory: Negative for cough, chest tightness and shortness of breath.   Cardiovascular: Negative for chest pain, palpitations and leg swelling.  Gastrointestinal: Negative for abdominal pain (Episodic pulling sensation), blood in stool, nausea and vomiting.  Neurological: Negative for dizziness, light-headedness and  headaches.       Occasional dysesthesias, discomfort in multiple areas as noted in HPI       Objective:   Physical Exam  Constitutional: He is oriented to person, place, and time. He appears well-developed and well-nourished.  HENT:  Head: Normocephalic and atraumatic.  Eyes: Pupils are equal, round, and reactive to light. EOM are normal.  Neck: No JVD present. Carotid bruit is not present.  Cardiovascular: Normal rate, regular rhythm and normal heart sounds.  No murmur heard. Pulmonary/Chest: Effort normal and breath sounds normal. He has no rales.  Musculoskeletal: He exhibits no edema.  Neurological: He is alert and oriented to person, place, and time.  Skin: Skin is warm and dry.     Psychiatric: He has a normal mood and affect.  Vitals reviewed.   Vitals:   03/14/18 1000  BP: 122/82  Pulse: 86  Temp: 98.4 F (36.9 C)  TempSrc: Oral  SpO2: 98%  Weight: 181 lb 3.2 oz (82.2 kg)  Height: '5\' 4"'$  (1.626 m)       Assessment & Plan:    ERICBERTO PADGET is a 47 y.o. male Type 2 diabetes mellitus with hyperglycemia, without long-term current use of insulin ([place]) - Plan: Comprehensive metabolic panel, POCT glucose (manual entry), Hemoglobin A1c, metFORMIN (GLUCOPHAGE) 1000 MG tablet, Insulin Glargine (LANTUS SOLOSTAR) 100 UNIT/ML Solostar Pen  -Recommended increase Lantus to 36 units, then increase by 2 units every 3 days until remaining under 200. continue same dose metformin.    -Dietary discretion likely responsible for variable readings.  Recommended smaller meals throughout the day, avoid late night meals/snacking, handout given on nutrition with diabetes.  Hyperlipidemia, unspecified hyperlipidemia type - Plan: Lipid panel, atorvastatin (LIPITOR) 20 MG tablet  -Tolerating Lipitor, continue same dose  Essential hypertension - Plan: lisinopril (PRINIVIL,ZESTRIL) 10 MG tablet  - Stable, tolerating current regimen. Medications refilled. Labs pending as above.   Advised to  contact surgeon regarding pulling  sensation on the abdomen.  No concerning findings on exam.  Possible scar vs nevus on calf - eval at derm for biopsy or eval.   Meds ordered this encounter  Medications  . metFORMIN (GLUCOPHAGE) 1000 MG tablet    Sig: Take 1 tablet (1,000 mg total) by mouth 2 (two) times daily with a meal.    Dispense:  90 tablet    Refill:  1  . lisinopril (PRINIVIL,ZESTRIL) 10 MG tablet    Sig: Take 1 tablet (10 mg total) by mouth daily.    Dispense:  90 tablet    Refill:  1  . Insulin Glargine (LANTUS SOLOSTAR) 100 UNIT/ML Solostar Pen    Sig: Inject 36 Units into the skin daily.    Dispense:  15 pen    Refill:  1  . atorvastatin (LIPITOR) 20 MG tablet    Sig: Take 1 tablet (20 mg total) by mouth daily at 6 PM.    Dispense:  90 tablet    Refill:  1   Patient Instructions    Pulling sensation in abdomen may be related to previous surgery.  Would recommend follow-up with your general surgeon to discuss the symptoms further.Return to the clinic or go to the nearest emergency room if any of your symptoms worsen or new symptoms occur.  Please call your dermatologist to schedule appointment to evaluate the area on your calf.  Does not look concerning at this time, but would like them to evaluate it for possible biopsy/removal.   For diabetes try to avoid late night meals, or if you are eating late at night, make sure that is not a large meal.  Try to have your large meal earlier in the day, and frequent smaller meals may also be better for your blood sugar readings.  See other information on diet with diabetes below.  For now increase to 36 units of insulin per day, and if readings are remaining in the 200s, increase an additional 2 units every 3 days until those are remaining in the 100s.  Recheck in 6 weeks.  Return to the clinic or go to the nearest emergency room if any of your symptoms worsen or new symptoms occur.   Type 1 Diabetes Mellitus, Self Care,  Adult When you have type 1 diabetes (type 1 diabetes mellitus), you must keep your blood sugar (glucose) under control. You can do this with:  Insulin.  Nutrition.  Exercise.  Lifestyle changes.  Other medicines, if needed.  Support from your doctors and others.  How do I manage my blood sugar?  Check your blood sugar every day, as often as told.  Call your doctor if your blood sugar is above your goal numbers for 2 tests in a row.  Have your A1c (hemoglobin A1c) level checked at least twice a year. Have it checked more often if your doctor tells you to. Your doctor will set treatment goals for you. Generally, you should have these blood sugar levels:  Before meals (preprandial): 80-130 mg/dL (4.4-7.2 mmol/L).  After meals (postprandial): below 180 mg/dL (10 mmol/L).  A1c level: less than 7%.  What do I need to know about high blood sugar? High blood sugar is called hyperglycemia. Know the signs of high blood sugar. Signs may include:  Feeling: ? Thirsty. ? Hungry. ? Very tired.  Needing to pee (urinate) more than usual.  Blurry vision.  What do I need to know about low blood sugar? Low blood sugar is called hypoglycemia. This is  when blood sugar is at or below 70 mg/dL (3.9 mmol/L). Symptoms may include:  Feeling: ? Hungry. ? Worried or nervous (anxious). ? Sweaty and clammy. ? Confused. ? Dizzy. ? Sleepy. ? Sick to your stomach (nauseous).  Having: ? A fast heartbeat. ? A headache. ? A change in your vision. ? Jerky movements that you cannot control (seizure). ? Nightmares. ? Tingling or no feeling (numbness) around the mouth, lips, or tongue.  Having trouble with: ? Talking. ? Paying attention (concentrating). ? Moving (coordination). ? Sleeping.  Shaking.  Passing out (fainting).  Getting upset easily (irritability).  Treating low blood sugar  To treat low blood sugar, eat or drink something sugary right away. If you can think clearly  and swallow safely, follow the 15:15 rule:  Take 15 grams of a fast-acting carb (carbohydrate). Some fast-acting carbs are: ? 1 tube of glucose gel. ? 3 sugar tablets (glucose pills). ? 6-8 pieces of hard candy. ? 4 oz (120 mL) of fruit juice. ? 4 oz (120 mL) regular (not diet) soda.  Check your blood sugar 15 minutes after you take the carb.  If your blood sugar is still at or below 70 mg/dL (3.9 mmol/L), take 15 grams of a carb again.  If your blood sugar does not go above 70 mg/dL (3.9 mmol/L) after 3 tries, get help right away.  After your blood sugar goes back to normal, eat a meal or a snack within 1 hour.  Treating very low blood sugar If your blood sugar is at or below 54 mg/dL (3 mmol/L), you have very low blood sugar (severe hypoglycemia). This is an emergency. Do not wait to see if the symptoms will go away. Get medical help right away. Call your local emergency services (911 in the [place] Do not drive yourself to the hospital. If you have very low blood sugar and you cannot eat or drink, you may need a glucagon shot (injection). A family member or friend should learn how to check your blood sugar and how to give you a glucagon shot. Ask your doctor if you need to have a glucagon shot kit at home. What else is important to manage my diabetes? Medicine  Take insulin and diabetes medicines as told.  Adjust your insulin and medicines as told.  Do not run out of insulin or medicines. Having diabetes can put you at risk for other long-term (chronic) conditions. These may include heart disease and kidney disease. Your doctor may prescribe medicines to help prevent problems from diabetes. Food  Make healthy food choices. These include: ? Chicken, fish, egg whites, and beans. ? Oats, whole wheat, bulgur, brown rice, quinoa, and millet. ? Fresh fruits and vegetables. ? Low-fat dairy products. ? Nuts, avocado, olive oil, and canola oil.  Meet with a food specialist (registered  dietitian). He or she can help you make an eating plan that is right for you.  Follow instructions from your doctor about what you cannot eat or drink.  Drink enough fluid to keep your pee (urine) clear or pale yellow.  Eat healthy snacks between healthy meals.  Keep track of carbs that you eat. Do this by reading food labels and learning food serving sizes.  Follow your sick day plan when you cannot eat or drink normally. Make this plan with your doctor so it is ready to use. Activity   Exercise at least 3 times a week.  Do not go more than 2 days without exercising.  Talk with  your doctor before you start a new exercise. Your doctor may need to adjust your insulin, medicines, or food. Lifestyle   Do not use any tobacco products. These include cigarettes, chewing tobacco, and e-cigarettes. If you need help quitting, ask your doctor.  Ask your doctor how much alcohol is safe for you.  Learn to deal with stress. If you need help with this, ask your doctor. Body care  Stay up to date with your shots (immunizations).  Have your eyes and feet checked by a doctor as often as told.  Check your skin and feet every day. Check for cuts, bruises, redness, blisters, or sores.  Brush your teeth and gums two times a day, and floss at least one time a day.  Go to the dentist least one time every 6 months.  Stay at a healthy weight. General instructions   Take over-the-counter and prescription medicines only as told by your doctor.  Share your diabetes care plan with: ? Your work or school. ? People you live with.  Check your pee (urine) for ketones: ? When you are sick. ? As told by your doctor.  Carry a card or wear jewelry that says that you have diabetes.  Ask your doctor: ? Do I need to meet with a diabetes educator? ? Where can I find a support group for people with diabetes?  Keep all follow-up visits as told by your doctor. This is important. Where to find more  information: To learn more about diabetes, visit:  American Diabetes Association: www.diabetes.org  American Association of Diabetes Educators: www.diabeteseducator.org/patient-resources  This information is not intended to replace advice given to you by your health care provider. Make sure you discuss any questions you have with your health care provider. Document Released: 11/18/2015 Document Revised: 01/02/2016 Document Reviewed: 08/30/2015 Elsevier Interactive Patient Education  2018 [employer].   Diabetes Mellitus and Nutrition When you have diabetes (diabetes mellitus), it is very important to have healthy eating habits because your blood sugar (glucose) levels are greatly affected by what you eat and drink. Eating healthy foods in the appropriate amounts, at about the same times every day, can help you:  Control your blood glucose.  Lower your risk of heart disease.  Improve your blood pressure.  Reach or maintain a healthy weight.  Every person with diabetes is different, and each person has different needs for a meal plan. Your health care provider may recommend that you work with a diet and nutrition specialist (dietitian) to make a meal plan that is best for you. Your meal plan may vary depending on factors such as:  The calories you need.  The medicines you take.  Your weight.  Your blood glucose, blood pressure, and cholesterol levels.  Your activity level.  Other health conditions you have, such as heart or kidney disease.  How do carbohydrates affect me? Carbohydrates affect your blood glucose level more than any other type of food. Eating carbohydrates naturally increases the amount of glucose in your blood. Carbohydrate counting is a method for keeping track of how many carbohydrates you eat. Counting carbohydrates is important to keep your blood glucose at a healthy level, especially if you use insulin or take certain oral diabetes medicines. It is  important to know how many carbohydrates you can safely have in each meal. This is different for every person. Your dietitian can help you calculate how many carbohydrates you should have at each meal and for snack. Foods that contain carbohydrates include:  Bread, cereal, rice, pasta, and crackers.  Potatoes and corn.  Peas, beans, and lentils.  Milk and yogurt.  Fruit and juice.  Desserts, such as cakes, cookies, ice cream, and candy.  How does alcohol affect me? Alcohol can cause a sudden decrease in blood glucose (hypoglycemia), especially if you use insulin or take certain oral diabetes medicines. Hypoglycemia can be a life-threatening condition. Symptoms of hypoglycemia (sleepiness, dizziness, and confusion) are similar to symptoms of having too much alcohol. If your health care provider says that alcohol is safe for you, follow these guidelines:  Limit alcohol intake to no more than 1 drink per day for nonpregnant women and 2 drinks per day for men. One drink equals 12 oz of beer, 5 oz of wine, or 1 oz of hard liquor.  Do not drink on an empty stomach.  Keep yourself hydrated with water, diet soda, or unsweetened iced tea.  Keep in mind that regular soda, juice, and other mixers may contain a lot of sugar and must be counted as carbohydrates.  What are tips for following this plan? Reading food labels  Start by checking the serving size on the label. The amount of calories, carbohydrates, fats, and other nutrients listed on the label are based on one serving of the food. Many foods contain more than one serving per package.  Check the total grams (g) of carbohydrates in one serving. You can calculate the number of servings of carbohydrates in one serving by dividing the total carbohydrates by 15. For example, if a food has 30 g of total carbohydrates, it would be equal to 2 servings of carbohydrates.  Check the number of grams (g) of saturated and trans fats in one serving.  Choose foods that have low or no amount of these fats.  Check the number of milligrams (mg) of sodium in one serving. Most people should limit total sodium intake to less than 2,300 mg per day.  Always check the nutrition information of foods labeled as "low-fat" or "nonfat". These foods may be higher in added sugar or refined carbohydrates and should be avoided.  Talk to your dietitian to identify your daily goals for nutrients listed on the label. Shopping  Avoid buying canned, premade, or processed foods. These foods tend to be high in fat, sodium, and added sugar.  Shop around the outside edge of the grocery store. This includes fresh fruits and vegetables, bulk grains, fresh meats, and fresh dairy. Cooking  Use low-heat cooking methods, such as baking, instead of high-heat cooking methods like deep frying.  Cook using healthy oils, such as olive, canola, or sunflower oil.  Avoid cooking with butter, cream, or high-fat meats. Meal planning  Eat meals and snacks regularly, preferably at the same times every day. Avoid going long periods of time without eating.  Eat foods high in fiber, such as fresh fruits, vegetables, beans, and whole grains. Talk to your dietitian about how many servings of carbohydrates you can eat at each meal.  Eat 4-6 ounces of lean protein each day, such as lean meat, chicken, fish, eggs, or tofu. 1 ounce is equal to 1 ounce of meat, chicken, or fish, 1 egg, or 1/4 cup of tofu.  Eat some foods each day that contain healthy fats, such as avocado, nuts, seeds, and fish. Lifestyle   Check your blood glucose regularly.  Exercise at least 30 minutes 5 or more days each week, or as told by your health care provider.  Take medicines as told  by your health care provider.  Do not use any products that contain nicotine or tobacco, such as cigarettes and e-cigarettes. If you need help quitting, ask your health care provider.  Work with a Social worker or diabetes  educator to identify strategies to manage stress and any emotional and social challenges. What are some questions to ask my health care provider?  Do I need to meet with a diabetes educator?  Do I need to meet with a dietitian?  What number can I call if I have questions?  When are the best times to check my blood glucose? Where to find more information:  American Diabetes Association: diabetes.org/food-and-fitness/food  Academy of Nutrition and Dietetics: PokerClues.dk  [employer] of Diabetes and Digestive and Kidney Diseases (NIH): ContactWire.be Summary  A healthy meal plan will help you control your blood glucose and maintain a healthy lifestyle.  Working with a diet and nutrition specialist (dietitian) can help you make a meal plan that is best for you.  Keep in mind that carbohydrates and alcohol have immediate effects on your blood glucose levels. It is important to count carbohydrates and to use alcohol carefully. This information is not intended to replace advice given to you by your health care provider. Make sure you discuss any questions you have with your health care provider. Document Released: 04/23/2005 Document Revised: 08/31/2016 Document Reviewed: 08/31/2016 Elsevier Interactive Patient Education  2018 [employer].   IF you received an x-ray today, you will receive an invoice from [facility]. Please contact [facility] at [phone] with questions or concerns regarding your invoice.   IF you received labwork today, you will receive an invoice from [place]. Please contact [facility] at [phone] with questions or concerns regarding your invoice.   Our billing staff will not be able to assist you with questions regarding bills from these companies.  You will be contacted with the lab results as soon as they are  available. The fastest way to get your results is to activate your My Chart account. Instructions are located on the last page of this paperwork. If you have not heard from [place] regarding the results in 2 weeks, please contact this office.    ]   Signed,   [name], MD [facility] [place].  03/19/18 9:54 AM

## 2018-03-14 NOTE — Patient Instructions (Addendum)
Pulling sensation in abdomen may be related to previous surgery.  Would recommend follow-up with your general surgeon to discuss the symptoms further.Return to the clinic or go to the nearest emergency room if any of your symptoms worsen or new symptoms occur.  Please call your dermatologist to schedule appointment to evaluate the area on your calf.  Does not look concerning at this time, but would like them to evaluate it for possible biopsy/removal.   For diabetes try to avoid late night meals, or if you are eating late at night, make sure that is not a large meal.  Try to have your large meal earlier in the day, and frequent smaller meals may also be better for your blood sugar readings.  See other information on diet with diabetes below.  For now increase to 36 units of insulin per day, and if readings are remaining in the 200s, increase an additional 2 units every 3 days until those are remaining in the 100s.  Recheck in 6 weeks.  Return to the clinic or go to the nearest emergency room if any of your symptoms worsen or new symptoms occur.   Type 1 Diabetes Mellitus, Self Care, Adult When you have type 1 diabetes (type 1 diabetes mellitus), you must keep your blood sugar (glucose) under control. You can do this with:  Insulin.  Nutrition.  Exercise.  Lifestyle changes.  Other medicines, if needed.  Support from your doctors and others.  How do I manage my blood sugar?  Check your blood sugar every day, as often as told.  Call your doctor if your blood sugar is above your goal numbers for 2 tests in a row.  Have your A1c (hemoglobin A1c) level checked at least twice a year. Have it checked more often if your doctor tells you to. Your doctor will set treatment goals for you. Generally, you should have these blood sugar levels:  Before meals (preprandial): 80-130 mg/dL (4.4-7.2 mmol/L).  After meals (postprandial): below 180 mg/dL (10 mmol/L).  A1c level: less than  7%.  What do I need to know about high blood sugar? High blood sugar is called hyperglycemia. Know the signs of high blood sugar. Signs may include:  Feeling: ? Thirsty. ? Hungry. ? Very tired.  Needing to pee (urinate) more than usual.  Blurry vision.  What do I need to know about low blood sugar? Low blood sugar is called hypoglycemia. This is when blood sugar is at or below 70 mg/dL (3.9 mmol/L). Symptoms may include:  Feeling: ? Hungry. ? Worried or nervous (anxious). ? Sweaty and clammy. ? Confused. ? Dizzy. ? Sleepy. ? Sick to your stomach (nauseous).  Having: ? A fast heartbeat. ? A headache. ? A change in your vision. ? Jerky movements that you cannot control (seizure). ? Nightmares. ? Tingling or no feeling (numbness) around the mouth, lips, or tongue.  Having trouble with: ? Talking. ? Paying attention (concentrating). ? Moving (coordination). ? Sleeping.  Shaking.  Passing out (fainting).  Getting upset easily (irritability).  Treating low blood sugar  To treat low blood sugar, eat or drink something sugary right away. If you can think clearly and swallow safely, follow the 15:15 rule:  Take 15 grams of a fast-acting carb (carbohydrate). Some fast-acting carbs are: ? 1 tube of glucose gel. ? 3 sugar tablets (glucose pills). ? 6-8 pieces of hard candy. ? 4 oz (120 mL) of fruit juice. ? 4 oz (120 mL) regular (not diet) soda.  Check  your blood sugar 15 minutes after you take the carb.  If your blood sugar is still at or below 70 mg/dL (3.9 mmol/L), take 15 grams of a carb again.  If your blood sugar does not go above 70 mg/dL (3.9 mmol/L) after 3 tries, get help right away.  After your blood sugar goes back to normal, eat a meal or a snack within 1 hour.  Treating very low blood sugar If your blood sugar is at or below 54 mg/dL (3 mmol/L), you have very low blood sugar (severe hypoglycemia). This is an emergency. Do not wait to see if the  symptoms will go away. Get medical help right away. Call your local emergency services (911 in the [place] Do not drive yourself to the hospital. If you have very low blood sugar and you cannot eat or drink, you may need a glucagon shot (injection). A family member or friend should learn how to check your blood sugar and how to give you a glucagon shot. Ask your doctor if you need to have a glucagon shot kit at home. What else is important to manage my diabetes? Medicine  Take insulin and diabetes medicines as told.  Adjust your insulin and medicines as told.  Do not run out of insulin or medicines. Having diabetes can put you at risk for other long-term (chronic) conditions. These may include heart disease and kidney disease. Your doctor may prescribe medicines to help prevent problems from diabetes. Food  Make healthy food choices. These include: ? Chicken, fish, egg whites, and beans. ? Oats, whole wheat, bulgur, brown rice, quinoa, and millet. ? Fresh fruits and vegetables. ? Low-fat dairy products. ? Nuts, avocado, olive oil, and canola oil.  Meet with a food specialist (registered dietitian). He or she can help you make an eating plan that is right for you.  Follow instructions from your doctor about what you cannot eat or drink.  Drink enough fluid to keep your pee (urine) clear or pale yellow.  Eat healthy snacks between healthy meals.  Keep track of carbs that you eat. Do this by reading food labels and learning food serving sizes.  Follow your sick day plan when you cannot eat or drink normally. Make this plan with your doctor so it is ready to use. Activity   Exercise at least 3 times a week.  Do not go more than 2 days without exercising.  Talk with your doctor before you start a new exercise. Your doctor may need to adjust your insulin, medicines, or food. Lifestyle   Do not use any tobacco products. These include cigarettes, chewing tobacco, and e-cigarettes. If  you need help quitting, ask your doctor.  Ask your doctor how much alcohol is safe for you.  Learn to deal with stress. If you need help with this, ask your doctor. Body care  Stay up to date with your shots (immunizations).  Have your eyes and feet checked by a doctor as often as told.  Check your skin and feet every day. Check for cuts, bruises, redness, blisters, or sores.  Brush your teeth and gums two times a day, and floss at least one time a day.  Go to the dentist least one time every 6 months.  Stay at a healthy weight. General instructions   Take over-the-counter and prescription medicines only as told by your doctor.  Share your diabetes care plan with: ? Your work or school. ? People you live with.  Check your pee (urine)  for ketones: ? When you are sick. ? As told by your doctor.  Carry a card or wear jewelry that says that you have diabetes.  Ask your doctor: ? Do I need to meet with a diabetes educator? ? Where can I find a support group for people with diabetes?  Keep all follow-up visits as told by your doctor. This is important. Where to find more information: To learn more about diabetes, visit:  American Diabetes Association: www.diabetes.org  American Association of Diabetes Educators: www.diabeteseducator.org/patient-resources  This information is not intended to replace advice given to you by your health care provider. Make sure you discuss any questions you have with your health care provider. Document Released: 11/18/2015 Document Revised: 01/02/2016 Document Reviewed: 08/30/2015 Elsevier Interactive Patient Education  2018 [employer].   Diabetes Mellitus and Nutrition When you have diabetes (diabetes mellitus), it is very important to have healthy eating habits because your blood sugar (glucose) levels are greatly affected by what you eat and drink. Eating healthy foods in the appropriate amounts, at about the same times every day, can  help you:  Control your blood glucose.  Lower your risk of heart disease.  Improve your blood pressure.  Reach or maintain a healthy weight.  Every person with diabetes is different, and each person has different needs for a meal plan. Your health care provider may recommend that you work with a diet and nutrition specialist (dietitian) to make a meal plan that is best for you. Your meal plan may vary depending on factors such as:  The calories you need.  The medicines you take.  Your weight.  Your blood glucose, blood pressure, and cholesterol levels.  Your activity level.  Other health conditions you have, such as heart or kidney disease.  How do carbohydrates affect me? Carbohydrates affect your blood glucose level more than any other type of food. Eating carbohydrates naturally increases the amount of glucose in your blood. Carbohydrate counting is a method for keeping track of how many carbohydrates you eat. Counting carbohydrates is important to keep your blood glucose at a healthy level, especially if you use insulin or take certain oral diabetes medicines. It is important to know how many carbohydrates you can safely have in each meal. This is different for every person. Your dietitian can help you calculate how many carbohydrates you should have at each meal and for snack. Foods that contain carbohydrates include:  Bread, cereal, rice, pasta, and crackers.  Potatoes and corn.  Peas, beans, and lentils.  Milk and yogurt.  Fruit and juice.  Desserts, such as cakes, cookies, ice cream, and candy.  How does alcohol affect me? Alcohol can cause a sudden decrease in blood glucose (hypoglycemia), especially if you use insulin or take certain oral diabetes medicines. Hypoglycemia can be a life-threatening condition. Symptoms of hypoglycemia (sleepiness, dizziness, and confusion) are similar to symptoms of having too much alcohol. If your health care provider says that  alcohol is safe for you, follow these guidelines:  Limit alcohol intake to no more than 1 drink per day for nonpregnant women and 2 drinks per day for men. One drink equals 12 oz of beer, 5 oz of wine, or 1 oz of hard liquor.  Do not drink on an empty stomach.  Keep yourself hydrated with water, diet soda, or unsweetened iced tea.  Keep in mind that regular soda, juice, and other mixers may contain a lot of sugar and must be counted as carbohydrates.  What are  tips for following this plan? Reading food labels  Start by checking the serving size on the label. The amount of calories, carbohydrates, fats, and other nutrients listed on the label are based on one serving of the food. Many foods contain more than one serving per package.  Check the total grams (g) of carbohydrates in one serving. You can calculate the number of servings of carbohydrates in one serving by dividing the total carbohydrates by 15. For example, if a food has 30 g of total carbohydrates, it would be equal to 2 servings of carbohydrates.  Check the number of grams (g) of saturated and trans fats in one serving. Choose foods that have low or no amount of these fats.  Check the number of milligrams (mg) of sodium in one serving. Most people should limit total sodium intake to less than 2,300 mg per day.  Always check the nutrition information of foods labeled as "low-fat" or "nonfat". These foods may be higher in added sugar or refined carbohydrates and should be avoided.  Talk to your dietitian to identify your daily goals for nutrients listed on the label. Shopping  Avoid buying canned, premade, or processed foods. These foods tend to be high in fat, sodium, and added sugar.  Shop around the outside edge of the grocery store. This includes fresh fruits and vegetables, bulk grains, fresh meats, and fresh dairy. Cooking  Use low-heat cooking methods, such as baking, instead of high-heat cooking methods like deep  frying.  Cook using healthy oils, such as olive, canola, or sunflower oil.  Avoid cooking with butter, cream, or high-fat meats. Meal planning  Eat meals and snacks regularly, preferably at the same times every day. Avoid going long periods of time without eating.  Eat foods high in fiber, such as fresh fruits, vegetables, beans, and whole grains. Talk to your dietitian about how many servings of carbohydrates you can eat at each meal.  Eat 4-6 ounces of lean protein each day, such as lean meat, chicken, fish, eggs, or tofu. 1 ounce is equal to 1 ounce of meat, chicken, or fish, 1 egg, or 1/4 cup of tofu.  Eat some foods each day that contain healthy fats, such as avocado, nuts, seeds, and fish. Lifestyle   Check your blood glucose regularly.  Exercise at least 30 minutes 5 or more days each week, or as told by your health care provider.  Take medicines as told by your health care provider.  Do not use any products that contain nicotine or tobacco, such as cigarettes and e-cigarettes. If you need help quitting, ask your health care provider.  Work with a Social worker or diabetes educator to identify strategies to manage stress and any emotional and social challenges. What are some questions to ask my health care provider?  Do I need to meet with a diabetes educator?  Do I need to meet with a dietitian?  What number can I call if I have questions?  When are the best times to check my blood glucose? Where to find more information:  American Diabetes Association: diabetes.org/food-and-fitness/food  Academy of Nutrition and Dietetics: PokerClues.dk  [employer] of Diabetes and Digestive and Kidney Diseases (NIH): ContactWire.be Summary  A healthy meal plan will help you control your blood glucose and maintain a healthy lifestyle.  Working with a diet  and nutrition specialist (dietitian) can help you make a meal plan that is best for you.  Keep in mind that carbohydrates and alcohol have immediate effects on  your blood glucose levels. It is important to count carbohydrates and to use alcohol carefully. This information is not intended to replace advice given to you by your health care provider. Make sure you discuss any questions you have with your health care provider. Document Released: 04/23/2005 Document Revised: 08/31/2016 Document Reviewed: 08/31/2016 Elsevier Interactive Patient Education  2018 [employer].   IF you received an x-ray today, you will receive an invoice from [facility]. Please contact [facility] at [phone] with questions or concerns regarding your invoice.   IF you received labwork today, you will receive an invoice from [place]. Please contact [facility] at [phone] with questions or concerns regarding your invoice.   Our billing staff will not be able to assist you with questions regarding bills from these companies.  You will be contacted with the lab results as soon as they are available. The fastest way to get your results is to activate your My Chart account. Instructions are located on the last page of this paperwork. If you have not heard from [place] regarding the results in 2 weeks, please contact this office.    ]

## 2018-03-15 LAB — COMPREHENSIVE METABOLIC PANEL
ALT: 40 IU/L (ref 0–44)
AST: 24 IU/L (ref 0–40)
Albumin/Globulin Ratio: 1.8 (ref 1.2–2.2)
Albumin: 4.6 g/dL (ref 3.5–5.5)
Alkaline Phosphatase: 109 IU/L (ref 39–117)
BUN/Creatinine Ratio: 18 (ref 9–20)
BUN: 15 mg/dL (ref 6–24)
Bilirubin Total: 0.5 mg/dL (ref 0.0–1.2)
CO2: 20 mmol/L (ref 20–29)
Calcium: 9.4 mg/dL (ref 8.7–10.2)
Chloride: 101 mmol/L (ref 96–106)
Creatinine, Ser: 0.83 mg/dL (ref 0.76–1.27)
GFR calc Af Amer: 119 mL/min/{1.73_m2} (ref >59)
GFR calc non Af Amer: 103 mL/min/{1.73_m2} (ref >59)
Globulin, Total: 2.5 g/dL (ref 1.5–4.5)
Glucose: 204 mg/dL — ABNORMAL HIGH (ref 65–99)
Potassium: 4.8 mmol/L (ref 3.5–5.2)
Sodium: 137 mmol/L (ref 134–144)
Total Protein: 7.1 g/dL (ref 6.0–8.5)

## 2018-03-15 LAB — HEMOGLOBIN A1C
Est. average glucose Bld gHb Est-mCnc: 301 mg/dL
Hgb A1c MFr Bld: 12.1 % — ABNORMAL HIGH (ref 4.8–5.6)

## 2018-03-15 LAB — LIPID PANEL
Chol/HDL Ratio: 3.9 ratio (ref 0.0–5.0)
Cholesterol, Total: 143 mg/dL (ref 100–199)
HDL: 37 mg/dL — ABNORMAL LOW (ref >39)
LDL Calculated: 92 mg/dL (ref 0–99)
Triglycerides: 70 mg/dL (ref 0–149)
VLDL Cholesterol Cal: 14 mg/dL (ref 5–40)

## 2018-03-19 ENCOUNTER — Encounter: Payer: Self-pay | Admitting: Family Medicine

## 2018-04-25 ENCOUNTER — Encounter: Payer: Self-pay | Admitting: Family Medicine

## 2018-04-25 ENCOUNTER — Other Ambulatory Visit: Payer: Self-pay

## 2018-04-25 ENCOUNTER — Ambulatory Visit (INDEPENDENT_AMBULATORY_CARE_PROVIDER_SITE_OTHER): Payer: BLUE CROSS/BLUE SHIELD | Admitting: Family Medicine

## 2018-04-25 VITALS — BP 120/79 | HR 86 | Temp 97.9°F | Resp 16 | Ht 64.0 in | Wt 183.0 lb

## 2018-04-25 DIAGNOSIS — Z23 Encounter for immunization: Secondary | ICD-10-CM | POA: Diagnosis not present

## 2018-04-25 DIAGNOSIS — Z794 Long term (current) use of insulin: Secondary | ICD-10-CM | POA: Diagnosis not present

## 2018-04-25 DIAGNOSIS — E1165 Type 2 diabetes mellitus with hyperglycemia: Secondary | ICD-10-CM

## 2018-04-25 LAB — GLUCOSE, POCT (MANUAL RESULT ENTRY): POC Glucose: 220 mg/dl — AB (ref 70–99)

## 2018-04-25 NOTE — Patient Instructions (Addendum)
   Continue to watch diet as that appears to be a factor in the variability and high readings.  Pack cooler with healthier food to help with food choices when driving, and meal planning on Sunday may help.   44 units for now and continue metformin same dose for now. If running over 200's - can increase lantus another 2 units. Let me knwo if there are questions. Follow up in 6 weeks for another A1C.  Bring a record of blood sugars (2 hours after meals, or fasting) at your next visit.   If you have lab work done today you will be contacted with your lab results within the next 2 weeks.  If you have not heard from [place] then please contact us. The fastest way to get your results is to register for My Chart.   IF you received an x-ray today, you will receive an invoice from [facility]. Please contact [facility] at [phone] with questions or concerns regarding your invoice.   IF you received labwork today, you will receive an invoice from [place]. Please contact [facility] at [phone] with questions or concerns regarding your invoice.   Our billing staff will not be able to assist you with questions regarding bills from these companies.  You will be contacted with the lab results as soon as they are available. The fastest way to get your results is to activate your My Chart account. Instructions are located on the last page of this paperwork. If you have not heard from [place] regarding the results in 2 weeks, please contact this office.

## 2018-04-25 NOTE — Progress Notes (Addendum)
Subjective:  By signing my name below, I, Logan [name], attest that this documentation has been prepared under the direction and in the presence of Logan Agreste, MD Electronically Signed: [name], ED Scribe 04/25/2018 at 9:21 AM.   Patient ID: Logan [name], male    DOB: [birth year], 47 y.o.   MRN: [id number]  Chief Complaint  Patient presents with  . Diabetes    6 week follow-up from medication change/ insulin   HPI Logan [name] is a 47 y.o. male who presents to Primary Care at [facility] for f/u. Last seen 8/5. DM with insulin complicated by hyperglycemia. Statin with Lipitor, ACE with Lipitor. Optho 05/11/17. Foot: 11/15/17. Urine micro: today. Started insulin in April due to persistent hyperglycemia. When increased to 36 units 8/5 with plan to increase until readings under 200. Continued same dose metformin 1000 mg bid and diet discussed. - Pt reports blood glucose in 180s a few days ago. He had been using 40-42 units, recently increased to 44 units 2 days ago due to consistent 200 readings, readings in 180s since increase. Pt has noticed higher 100 readings when his diet is controlled. Reports that he is still inconsistent with the times that he eats but striving for 7 PM cut-off and packing his lunch. Reports readings ~250 when he eats late. Denies symptomatic readings. Pt works M-F driving trucks.  Lab Results  Component Value Date   HGBA1C 12.1 (H) 03/14/2018  Minimally changed from 12.2 in April.  Patient Active Problem List   Diagnosis Date Noted  . Acute sinusitis 07/06/2017  . Asthmatic bronchitis 07/06/2017  . Allergic rhinitis with a nonallergic component 12/08/2016  . Psoriasis 12/08/2016  . Diabetes ([place]) 05/31/2012  . Hypertension 05/31/2012   Past Medical History:  Diagnosis Date  . Asthmatic bronchitis 07/06/2017  . Diabetes mellitus    takes Janumet and Invokana daily  . Hernia, umbilical   . Hypertension    takes Lisinopril daily  . Plaque psoriasis    . Seasonal allergies    takes Claritin daily and uses Flonase daily   Past Surgical History:  Procedure Laterality Date  . INSERTION OF MESH N/A 05/07/2016   Procedure: INSERTION OF MESH;  Surgeon: Erroll Luna, MD;  Location: [place];  Service: General;  Laterality: N/A;  . TYMPANOSTOMY TUBE PLACEMENT    . VENTRAL HERNIA REPAIR N/A 05/07/2016   Procedure: REPAIR VENTRAL HERNIA;  Surgeon: Erroll Luna, MD;  Location: [place];  Service: General;  Laterality: N/A;   Allergies  Allergen Reactions  . Penicillins Hives and Rash    Has patient had a PCN reaction causing immediate rash, facial/tongue/throat swelling, SOB or lightheadedness with hypotension: Yes Has patient had a PCN reaction causing severe rash involving mucus membranes or skin necrosis: No Has patient had a PCN reaction that required hospitalization No Has patient had a PCN reaction occurring within the last 10 years: No If all of the above answers are "NO", then may proceed with Cephalosporin use.    Prior to Admission medications   Medication Sig Start Date End Date Taking? Authorizing Provider  aspirin EC 81 MG tablet Take 81 mg by mouth daily.    [provider]  atorvastatin (LIPITOR) 20 MG tablet Take 1 tablet (20 mg total) by mouth daily at 6 PM. 03/14/18   Logan Agreste, MD  B Complex-C (B-COMPLEX WITH VITAMIN C) tablet Take 1 tablet by mouth daily.    [provider]  B-D UF III MINI  PEN NEEDLES 31G X 5 MM MISC USE AS DIRECTED DAILY 02/09/18   Logan Agreste, MD  BAYER MICROLET LANCETS lancets TEST UP TO 3 TIMES A DAY 01/11/18   Logan Agreste, MD  blood glucose meter kit and supplies Dispense based on patient and insurance preference. Use up to 3 times daily, uncontrolled diabetes.  Contour Next meter with test strips and lancets. 11/15/17   Logan Agreste, MD  CONTOUR NEXT TEST test strip TEST UP TO 3 TIMES EVERY DAY 03/09/18   Logan Agreste, MD  cyclobenzaprine (FLEXERIL) 5 MG tablet 1  pill by mouth up to every 8 hours as needed. Start with one pill by mouth each bedtime as needed due to sedation 05/04/16   Logan Agreste, MD  fluticasone [facility]) 50 MCG/ACT nasal spray Place 2 sprays into both nostrils daily. 07/05/17   Posey Boyer, MD  Insulin Glargine (LANTUS SOLOSTAR) 100 UNIT/ML Solostar Pen Inject 36 Units into the skin daily. 03/14/18   Logan Agreste, MD  lisinopril (PRINIVIL,ZESTRIL) 10 MG tablet Take 1 tablet (10 mg total) by mouth daily. 03/14/18   Logan Agreste, MD  loratadine (CLARITIN) 10 MG tablet Take 10 mg by mouth daily.    [provider]  metFORMIN (GLUCOPHAGE) 1000 MG tablet Take 1 tablet (1,000 mg total) by mouth 2 (two) times daily with a meal. 03/14/18   Logan Agreste, MD  Multiple Vitamin (MULTIVITAMIN WITH MINERALS) TABS tablet Take 1 tablet by mouth daily.    [provider]  vitamin B-12 (CYANOCOBALAMIN) 1000 MCG tablet Take 1,000 mcg by mouth daily.    [provider]  vitamin C (ASCORBIC ACID) 500 MG tablet Take 500 mg by mouth daily.    [provider]   Social History   Socioeconomic History  . Marital status: Single    Spouse name: Not on file  . Number of children: Not on file  . Years of education: Not on file  . Highest education level: Not on file  Occupational History  . Not on file  Social Needs  . Financial resource strain: Not on file  . Food insecurity:    Worry: Not on file    Inability: Not on file  . Transportation needs:    Medical: Not on file    Non-medical: Not on file  Tobacco Use  . Smoking status: Never Smoker  . Smokeless tobacco: Never Used  Substance and Sexual Activity  . Alcohol use: Yes    Comment: occ glass of wine.2-3 per month  . Drug use: No  . Sexual activity: Never  Lifestyle  . Physical activity:    Days per week: Not on file    Minutes per session: Not on file  . Stress: Not on file  Relationships  . Social connections:    Talks on phone: Not  on file    Gets together: Not on file    Attends religious service: Not on file    Active member of club or organization: Not on file    Attends meetings of clubs or organizations: Not on file    Relationship status: Not on file  . Intimate partner violence:    Fear of current or ex partner: Not on file    Emotionally abused: Not on file    Physically abused: Not on file    Forced sexual activity: Not on file  Other Topics Concern  . Not on file  Social History Narrative  .  Not on file   Review of Systems  Constitutional: Negative for fatigue and unexpected weight change.  Eyes: Negative for visual disturbance.  Respiratory: Negative for cough, chest tightness and shortness of breath.   Cardiovascular: Negative for chest pain, palpitations and leg swelling.  Gastrointestinal: Negative for abdominal pain and blood in stool.  Neurological: Negative for dizziness, light-headedness and headaches.      Objective:   Physical Exam  Constitutional: He is oriented to person, place, and time. He appears well-developed and well-nourished. No distress.  HENT:  Head: Normocephalic and atraumatic.  Eyes: Pupils are equal, round, and reactive to light. Conjunctivae and EOM are normal.  Neck: Neck supple. No JVD present. Carotid bruit is not present. No tracheal deviation present.  Cardiovascular: Normal rate, regular rhythm and normal heart sounds.  No murmur heard. Pulmonary/Chest: Effort normal and breath sounds normal. No respiratory distress. He has no rales.  Musculoskeletal: Normal range of motion. He exhibits no edema.  Neurological: He is alert and oriented to person, place, and time.  Skin: Skin is warm and dry.  Psychiatric: He has a normal mood and affect. His behavior is normal.  Nursing note and vitals reviewed.  Vitals:   04/25/18 0912  BP: 120/79  Pulse: 86  Resp: 16  Temp: 97.9 F (36.6 C)  TempSrc: Oral  SpO2: 96%  Weight: 183 lb (83 kg)  Height: '5\' 4"'$  (1.626 m)        Results for orders placed or performed in visit on 04/25/18  POCT glucose (manual entry)  Result Value Ref Range   POC Glucose 220 (A) 70 - 99 mg/dl    Assessment & Plan:  GARRETTE CAINE is a 47 y.o. male Type 2 diabetes mellitus with hyperglycemia, with long-term current use of insulin ([place]) - Plan: POCT glucose (manual entry), Microalbumin/Creatinine Ratio, Urine  -Increase Lantus to 44 units based on persistent elevated readings.  Continue to watch diet, and meal planning discussed.  Minimize fast food  -Continue metformin same dose.  -Recheck 6 weeks, sooner if needed.  -Urine microalbumin/creatinine obtained  Needs flu shot - Plan: Flu Vaccine QUAD 36+ mos IM given   No orders of the defined types were placed in this encounter.  Patient Instructions     Continue to watch diet as that appears to be a factor in the variability and high readings.  Pack cooler with healthier food to help with food choices when driving, and meal planning on Sunday may help.   44 units for now and continue metformin same dose for now. If running over 200's - can increase lantus another 2 units. Let me knwo if there are questions. Follow up in 6 weeks for another A1C.  Bring a record of blood sugars (2 hours after meals, or fasting) at your next visit.   If you have lab work done today you will be contacted with your lab results within the next 2 weeks.  If you have not heard from [place] then please contact us. The fastest way to get your results is to register for My Chart.   IF you received an x-ray today, you will receive an invoice from [facility]. Please contact [facility] at [phone] with questions or concerns regarding your invoice.   IF you received labwork today, you will receive an invoice from [place]. Please contact [facility] at [phone] with questions or concerns regarding your invoice.   Our billing staff will not be able to assist you with questions  regarding bills from these companies.  You will be contacted with the lab results as soon as they are available. The fastest way to get your results is to activate your My Chart account. Instructions are located on the last page of this paperwork. If you have not heard from [place] regarding the results in 2 weeks, please contact this office.       I personally performed the services described in this documentation, which was scribed in my presence. The recorded information has been reviewed and considered for accuracy and completeness, addended by me as needed, and agree with information above.  Signed,   [name], MD Primary Care at [place].  04/25/18 9:34 AM

## 2018-04-26 LAB — MICROALBUMIN / CREATININE URINE RATIO
Creatinine, Urine: 124 mg/dL
Microalb/Creat Ratio: 32.7 mg/g creat — ABNORMAL HIGH (ref 0.0–30.0)
Microalbumin, Urine: 40.5 ug/mL

## 2018-05-31 ENCOUNTER — Other Ambulatory Visit: Payer: Self-pay | Admitting: Family Medicine

## 2018-05-31 DIAGNOSIS — E1165 Type 2 diabetes mellitus with hyperglycemia: Secondary | ICD-10-CM

## 2018-06-06 ENCOUNTER — Ambulatory Visit: Payer: BLUE CROSS/BLUE SHIELD | Admitting: Family Medicine

## 2018-06-10 ENCOUNTER — Other Ambulatory Visit: Payer: Self-pay | Admitting: Family Medicine

## 2018-06-10 DIAGNOSIS — E1165 Type 2 diabetes mellitus with hyperglycemia: Secondary | ICD-10-CM

## 2018-06-13 NOTE — Telephone Encounter (Signed)
Courtesy refill  

## 2018-07-14 ENCOUNTER — Other Ambulatory Visit: Payer: Self-pay

## 2018-07-14 ENCOUNTER — Ambulatory Visit (INDEPENDENT_AMBULATORY_CARE_PROVIDER_SITE_OTHER): Payer: BLUE CROSS/BLUE SHIELD | Admitting: Family Medicine

## 2018-07-14 ENCOUNTER — Encounter: Payer: Self-pay | Admitting: Family Medicine

## 2018-07-14 DIAGNOSIS — I10 Essential (primary) hypertension: Secondary | ICD-10-CM | POA: Diagnosis not present

## 2018-07-14 DIAGNOSIS — Z794 Long term (current) use of insulin: Secondary | ICD-10-CM | POA: Diagnosis not present

## 2018-07-14 DIAGNOSIS — E1165 Type 2 diabetes mellitus with hyperglycemia: Secondary | ICD-10-CM | POA: Diagnosis not present

## 2018-07-14 DIAGNOSIS — E785 Hyperlipidemia, unspecified: Secondary | ICD-10-CM | POA: Diagnosis not present

## 2018-07-14 LAB — HEMOGLOBIN A1C
Est. average glucose Bld gHb Est-mCnc: 286 mg/dL
Hgb A1c MFr Bld: 11.6 % — ABNORMAL HIGH (ref 4.8–5.6)

## 2018-07-14 MED ORDER — LISINOPRIL 10 MG PO TABS: 10.0000 mg | ORAL_TABLET | Freq: Every day | ORAL | 1 refills | Status: DC

## 2018-07-14 MED ORDER — ATORVASTATIN CALCIUM 20 MG PO TABS: 20.0000 mg | ORAL_TABLET | Freq: Every day | ORAL | 1 refills | Status: DC

## 2018-07-14 MED ORDER — INSULIN GLARGINE 100 UNIT/ML SOLOSTAR PEN: 50.0000 [IU] | PEN_INJECTOR | Freq: Every day | SUBCUTANEOUS | 2 refills | Status: DC

## 2018-07-14 MED ORDER — METFORMIN HCL 1000 MG PO TABS: 1000.0000 mg | ORAL_TABLET | Freq: Two times a day (BID) | ORAL | 1 refills | Status: DC

## 2018-07-14 NOTE — Progress Notes (Signed)
Subjective:    Patient ID: Logan [name], male    DOB: Dec 27, 1970, 47 y.o.   MRN: [id number]  HPI Logan [name] is a 47 y.o. male Presents today for: Chief Complaint  Patient presents with  . Medication Refill    all meds   . Diabetes   Diabetes: Last office visit September 16.  Insulin started in April due to persistent hyperglycemia.  Last A1c 12.1 in August.  He had been on 44 units of insulin at last visit, continued on metformin 1000 mg twice daily.  Diet adherence and late night meals discussed last visit.  Planned on continuing Lantus at 44 units with diet changes, continued metformin same dose.  Elevated microalbumin/creatinine ratio of 32.7 on 04/25/2018. Ophthalmology 05/11/17 - has not scheduled yet.  Foot exam 11/15/2017. Pneumovax 11/20/2014. He is on ACE inhibitor and statin  Has made some diet changes, but still eating late and fast food at times. Still having readings in higher 200's.  Still having difficulty making good diet decisions. When making good diet choices, can get readings in low 100's. On lantus 50 units per day for past month-6 weeks. Still taking metformin twice per day.  No symptomatic lows.   Hypertension: BP Readings from Last 3 Encounters:  07/14/18 136/83  04/25/18 120/79  03/14/18 122/82   Lab Results  Component Value Date   CREATININE 0.83 03/14/2018  Takes lisinopril 10 mg daily.  No new side effects  Hyperlipidemia:  Lab Results  Component Value Date   CHOL 143 03/14/2018   HDL 37 (L) 03/14/2018   LDLCALC 92 03/14/2018   TRIG 70 03/14/2018   CHOLHDL 3.9 03/14/2018   Lab Results  Component Value Date   ALT 40 03/14/2018   AST 24 03/14/2018   ALKPHOS 109 03/14/2018   BILITOT 0.5 03/14/2018  Lipitor 20 mg daily.  No new side effects.   Patient Active Problem List   Diagnosis Date Noted  . Acute sinusitis 07/06/2017  . Asthmatic bronchitis 07/06/2017  . Allergic rhinitis with a nonallergic component 12/08/2016  . Psoriasis  12/08/2016  . Diabetes ([place]) 05/31/2012  . Hypertension 05/31/2012   Past Medical History:  Diagnosis Date  . Asthmatic bronchitis 07/06/2017  . Diabetes mellitus    takes Janumet and Invokana daily  . Hernia, umbilical   . Hypertension    takes Lisinopril daily  . Plaque psoriasis   . Seasonal allergies    takes Claritin daily and uses Flonase daily   Past Surgical History:  Procedure Laterality Date  . INSERTION OF MESH N/A 05/07/2016   Procedure: INSERTION OF MESH;  Surgeon: Erroll Luna, MD;  Location: [place];  Service: General;  Laterality: N/A;  . TYMPANOSTOMY TUBE PLACEMENT    . VENTRAL HERNIA REPAIR N/A 05/07/2016   Procedure: REPAIR VENTRAL HERNIA;  Surgeon: Erroll Luna, MD;  Location: [place];  Service: General;  Laterality: N/A;   Allergies  Allergen Reactions  . Penicillins Hives and Rash    Has patient had a PCN reaction causing immediate rash, facial/tongue/throat swelling, SOB or lightheadedness with hypotension: Yes Has patient had a PCN reaction causing severe rash involving mucus membranes or skin necrosis: No Has patient had a PCN reaction that required hospitalization No Has patient had a PCN reaction occurring within the last 10 years: No If all of the above answers are "NO", then may proceed with Cephalosporin use.    Prior to Admission medications   Medication Sig Start Date End Date Taking? Authorizing  Provider  aspirin EC 81 MG tablet Take 81 mg by mouth daily.   Yes [provider]  atorvastatin (LIPITOR) 20 MG tablet Take 1 tablet (20 mg total) by mouth daily at 6 PM. 03/14/18  Yes [name], MD  B Complex-C (B-COMPLEX WITH VITAMIN C) tablet Take 1 tablet by mouth daily.   Yes [provider]  B-D UF III MINI PEN NEEDLES 31G X 5 MM MISC USE AS DIRECTED DAILY 02/09/18  Yes [name], MD  BAYER MICROLET LANCETS lancets TEST UP TO 3 TIMES A DAY 01/11/18  Yes [name], MD  blood glucose meter kit and supplies  Dispense based on patient and insurance preference. Use up to 3 times daily, uncontrolled diabetes.  Contour Next meter with test strips and lancets. 11/15/17  Yes [name], MD  CONTOUR NEXT TEST test strip TEST UP TO 3 TIMES EVERY DAY 03/09/18  Yes [name], MD  cyclobenzaprine (FLEXERIL) 5 MG tablet 1 pill by mouth up to every 8 hours as needed. Start with one pill by mouth each bedtime as needed due to sedation 05/04/16  Yes [name], MD  fluticasone [facility]) 50 MCG/ACT nasal spray Place 2 sprays into both nostrils daily. 07/05/17  Yes Posey Boyer, MD  LANTUS SOLOSTAR 100 UNIT/ML Solostar Pen INJECT 36 UNITS INTO THE SKIN DAILY 05/31/18  Yes [name], MD  lisinopril (PRINIVIL,ZESTRIL) 10 MG tablet Take 1 tablet (10 mg total) by mouth daily. 03/14/18  Yes [name], MD  loratadine (CLARITIN) 10 MG tablet Take 10 mg by mouth daily.   Yes [provider]  metFORMIN (GLUCOPHAGE) 1000 MG tablet TAKE 1 TABLET BY MOUTH TWICE A DAY WITH A MEAL 06/13/18  Yes [name], MD  Multiple Vitamin (MULTIVITAMIN WITH MINERALS) TABS tablet Take 1 tablet by mouth daily.   Yes [provider]  vitamin B-12 (CYANOCOBALAMIN) 1000 MCG tablet Take 1,000 mcg by mouth daily.   Yes [provider]  vitamin C (ASCORBIC ACID) 500 MG tablet Take 500 mg by mouth daily.   Yes [provider]   Social History   Socioeconomic History  . Marital status: Single    Spouse name: Not on file  . Number of children: Not on file  . Years of education: Not on file  . Highest education level: Not on file  Occupational History  . Not on file  Social Needs  . Financial resource strain: Not on file  . Food insecurity:    Worry: Not on file    Inability: Not on file  . Transportation needs:    Medical: Not on file    Non-medical: Not on file  Tobacco Use  . Smoking status: Never Smoker  . Smokeless tobacco: Never Used  Substance and Sexual  Activity  . Alcohol use: Yes    Comment: occ glass of wine.2-3 per month  . Drug use: No  . Sexual activity: Never  Lifestyle  . Physical activity:    Days per week: Not on file    Minutes per session: Not on file  . Stress: Not on file  Relationships  . Social connections:    Talks on phone: Not on file    Gets together: Not on file    Attends religious service: Not on file    Active member of club or organization: Not on file    Attends meetings of clubs or organizations: Not on file  Relationship status: Not on file  . Intimate partner violence:    Fear of current or ex partner: Not on file    Emotionally abused: Not on file    Physically abused: Not on file    Forced sexual activity: Not on file  Other Topics Concern  . Not on file  Social History Narrative  . Not on file    Review of Systems Per HPI    Objective:   Physical Exam  Constitutional: He is oriented to person, place, and time. He appears well-developed and well-nourished.  HENT:  Head: Normocephalic and atraumatic.  Eyes: Pupils are equal, round, and reactive to light. EOM are normal.  Neck: No JVD present. Carotid bruit is not present.  Cardiovascular: Normal rate, regular rhythm and normal heart sounds.  No murmur heard. Pulmonary/Chest: Effort normal and breath sounds normal. He has no rales.  Musculoskeletal: He exhibits no edema.  Neurological: He is alert and oriented to person, place, and time.  Skin: Skin is warm and dry.  Psychiatric: He has a normal mood and affect.  Vitals reviewed.  Vitals:   07/14/18 0933  BP: 136/83  Pulse: 85  Temp: 97.8 F (36.6 C)  TempSrc: Oral  SpO2: 97%  Weight: 182 lb 4.8 oz (82.7 kg)  Height: _0  (1.6 m)      Assessment & Plan:   Logan [name] is a 47 y.o. male Type 2 diabetes mellitus with hyperglycemia, with long-term current use of insulin ([place]) - Plan: Amb ref to Medical Nutrition Therapy-MNT, Insulin Glargine (LANTUS SOLOSTAR) 100 UNIT/ML  Solostar Pen, metFORMIN (GLUCOPHAGE) 1000 MG tablet, Hemoglobin A1c  -Diet appears to be main issue.  Will refer to nutritionist, continue Lantus same dose for now, metformin same dose for now and recheck in 6 weeks.  Potential need for mealtime insulin coverage.  Essential hypertension - Plan: lisinopril (PRINIVIL,ZESTRIL) 10 MG tablet  -Stable.  Continue lisinopril same dose.  Hyperlipidemia, unspecified hyperlipidemia type - Plan: atorvastatin (LIPITOR) 20 MG tablet  -Tolerating Lipitor, could consider higher dose for goal LDL less than 70.  Can discuss further in 6 weeks  Meds ordered this encounter  Medications  . Insulin Glargine (LANTUS SOLOSTAR) 100 UNIT/ML Solostar Pen    Sig: Inject 50 Units into the skin daily.    Dispense:  15 pen    Refill:  2  . lisinopril (PRINIVIL,ZESTRIL) 10 MG tablet    Sig: Take 1 tablet (10 mg total) by mouth daily.    Dispense:  90 tablet    Refill:  1  . atorvastatin (LIPITOR) 20 MG tablet    Sig: Take 1 tablet (20 mg total) by mouth daily at 6 PM.    Dispense:  90 tablet    Refill:  1  . metFORMIN (GLUCOPHAGE) 1000 MG tablet    Sig: Take 1 tablet (1,000 mg total) by mouth 2 (two) times daily with a meal.    Dispense:  180 tablet    Refill:  1   Patient Instructions    Continue same dose of insulin at 50 units/day for now.  Diet may be the biggest area of improvement.  I will refer you to a nutritionist to see if they can help with some recommendations.  If persistent elevated readings we may need to add mealtime insulin or change your daily dose.  I will let you know.  Continue metformin same dose for now and recheck with me in 6 weeks.   If you have lab  work done today you will be contacted with your lab results within the next 2 weeks.  If you have not heard from [place] then please contact us. The fastest way to get your results is to register for My Chart.   IF you received an x-ray today, you will receive an invoice from [facility]  Rad[facility]. Please contact [facility] at [phone] with questions or concerns regarding your invoice.   IF you received labwork today, you will receive an invoice from [place]. Please contact [facility] at [phone] with questions or concerns regarding your invoice.   Our billing staff will not be able to assist you with questions regarding bills from these companies.  You will be contacted with the lab results as soon as they are available. The fastest way to get your results is to activate your My Chart account. Instructions are located on the last page of this paperwork. If you have not heard from [place] regarding the results in 2 weeks, please contact this office.       Signed,   [name], MD Primary Care at [place].  07/17/18 11:25 AM

## 2018-07-14 NOTE — Patient Instructions (Addendum)
  Continue same dose of insulin at 50 units/day for now.  Diet may be the biggest area of improvement.  I will refer you to a nutritionist to see if they can help with some recommendations.  If persistent elevated readings we may need to add mealtime insulin or change your daily dose.  I will let you know.  Continue metformin same dose for now and recheck with me in 6 weeks.   If you have lab work done today you will be contacted with your lab results within the next 2 weeks.  If you have not heard from [place] then please contact us. The fastest way to get your results is to register for My Chart.   IF you received an x-ray today, you will receive an invoice from [facility]. Please contact [facility] Radiology at [phone] with questions or concerns regarding your invoice.   IF you received labwork today, you will receive an invoice from [place]. Please contact [facility] at [phone] with questions or concerns regarding your invoice.   Our billing staff will not be able to assist you with questions regarding bills from these companies.  You will be contacted with the lab results as soon as they are available. The fastest way to get your results is to activate your My Chart account. Instructions are located on the last page of this paperwork. If you have not heard from [place] regarding the results in 2 weeks, please contact this office.

## 2018-07-17 ENCOUNTER — Encounter: Payer: Self-pay | Admitting: Family Medicine

## 2018-07-28 ENCOUNTER — Ambulatory Visit: Payer: BLUE CROSS/BLUE SHIELD | Admitting: Family Medicine

## 2018-08-26 ENCOUNTER — Ambulatory Visit: Payer: BLUE CROSS/BLUE SHIELD | Admitting: Family Medicine

## 2018-09-02 ENCOUNTER — Encounter: Payer: BLUE CROSS/BLUE SHIELD | Attending: Family Medicine | Admitting: *Deleted

## 2018-09-02 DIAGNOSIS — E1165 Type 2 diabetes mellitus with hyperglycemia: Secondary | ICD-10-CM | POA: Insufficient documentation

## 2018-09-02 DIAGNOSIS — Z794 Long term (current) use of insulin: Secondary | ICD-10-CM | POA: Insufficient documentation

## 2018-09-02 NOTE — Patient Instructions (Signed)
Plan:  Aim for 4 Carb Choices per meal (60 grams) +/- 1 either way  Aim for 0-2 Carbs per snack if hungry  Include protein in moderation with your meals and snacks Consider reading food labels for Total Carbohydrate of foods Consider  increasing your activity level for 30 minutes daily as tolerated Consider checking BG at alternate times per day including after some meals and after exercise   Consider taking medication Lantus at a more consistent time such as 10 PM

## 2018-09-08 NOTE — Progress Notes (Signed)
Diabetes Self-Management Education  Visit Type: Follow-up  Appt. Start Time: 0815 Appt. End Time: 3762  09/08/2018  [name], identified by name and date of birth, is a 48 y.o. male with a diagnosis of Diabetes:  . 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 He states he understands how to give his insuln, but demonstration of his technique reveals he is not rotating his sites properly. Please see Epic notes for this date for more details of this visit.   ASSESSMENT  There were no vitals taken for this visit. There is no height or weight on file to calculate BMI.  Diabetes Self-Management Education - 09/08/18 0800      Visit Information   Visit Type  Follow-up      Dietary Intake   Lunch  fast food because drives all day - 12" sub OR cafeteria type restaurant to get more vegetables or salad with ham    Beverage(s)  coffee, seltzer water, occasionally diet [place]       Individualized Plan for Diabetes Self-Management Training:   Learning Objective:  Patient will have a greater understanding of diabetes self-management. Patient education plan is to attend individual and/or group sessions per assessed needs and concerns.   Plan:   Patient Instructions  Plan:  Aim for 4 Carb Choices per meal (60 grams) +/- 1 either way  Aim for 0-2 Carbs per snack if hungry  Include protein in moderation with your meals and snacks Consider reading food labels for Total Carbohydrate of foods Consider  increasing your activity level for 30 minutes daily as tolerated Consider checking BG at alternate times per day including after some meals and after exercise   Consider taking medication Lantus at a more consistent time such as 10 PM  Expected Outcomes:  Demonstrated interest in learning. Expect positive outcomes  Education material provided: Food label handouts,  A1C conversion sheet, Meal plan card and Carbohydrate counting sheet, Insulin Action Handout, Diabetes Medication Handout, Insulin Injection Sites and Rotation Guide.  If problems or questions, patient to contact team via:  Phone  Future DSME appointment: PRN

## 2018-09-23 ENCOUNTER — Ambulatory Visit: Payer: BLUE CROSS/BLUE SHIELD | Admitting: Family Medicine

## 2018-10-06 ENCOUNTER — Ambulatory Visit (INDEPENDENT_AMBULATORY_CARE_PROVIDER_SITE_OTHER): Payer: BLUE CROSS/BLUE SHIELD | Admitting: Family Medicine

## 2018-10-06 ENCOUNTER — Encounter: Payer: Self-pay | Admitting: Family Medicine

## 2018-10-06 VITALS — BP 121/80 | HR 93 | Temp 98.5°F | Resp 16 | Ht 64.0 in | Wt 184.0 lb

## 2018-10-06 DIAGNOSIS — E1165 Type 2 diabetes mellitus with hyperglycemia: Secondary | ICD-10-CM | POA: Diagnosis not present

## 2018-10-06 DIAGNOSIS — E785 Hyperlipidemia, unspecified: Secondary | ICD-10-CM

## 2018-10-06 DIAGNOSIS — Z794 Long term (current) use of insulin: Secondary | ICD-10-CM | POA: Diagnosis not present

## 2018-10-06 DIAGNOSIS — I10 Essential (primary) hypertension: Secondary | ICD-10-CM | POA: Diagnosis not present

## 2018-10-06 LAB — GLUCOSE, POCT (MANUAL RESULT ENTRY): POC Glucose: 313 mg/dl — AB (ref 70–99)

## 2018-10-06 MED ORDER — METFORMIN HCL 1000 MG PO TABS: 1000.0000 mg | ORAL_TABLET | Freq: Two times a day (BID) | ORAL | 1 refills | Status: DC

## 2018-10-06 MED ORDER — INSULIN GLARGINE 100 UNIT/ML SOLOSTAR PEN: 52.0000 [IU] | PEN_INJECTOR | Freq: Every day | SUBCUTANEOUS | 2 refills | Status: DC

## 2018-10-06 MED ORDER — ATORVASTATIN CALCIUM 20 MG PO TABS: 20.0000 mg | ORAL_TABLET | Freq: Every day | ORAL | 1 refills | Status: DC

## 2018-10-06 MED ORDER — LISINOPRIL 10 MG PO TABS: 10.0000 mg | ORAL_TABLET | Freq: Every day | ORAL | 1 refills | Status: DC

## 2018-10-06 MED ORDER — INSULIN ASPART 100 UNIT/ML CARTRIDGE (PENFILL): 5.0000 [IU] | Freq: Every day | SUBCUTANEOUS | 1 refills | Status: DC

## 2018-10-06 MED ORDER — LISINOPRIL 10 MG PO TABS: 10.0000 mg | ORAL_TABLET | Freq: Every day | ORAL | 1 refills | Status: AC

## 2018-10-06 NOTE — Patient Instructions (Addendum)
I will check A1c, but based on reading today and averages, will need more medicine. Increase lantus to 52 units for now, then if remaining in 200's in next 3 days increase to 54 units.  Start 5 units of Novolog insulin with largest meal.  Watch for low blood sugars, although unlikely. See below.  Recheck in next 2-3 weeks. Bring record of fasting and 2 hour blood sugars after breakfast, lunch or dinner (vary time per day and check 1-2 readings per day)  Return to the clinic or go to the nearest emergency room if any of your symptoms worsen or new symptoms occur.   Hypoglycemia Hypoglycemia occurs when the level of sugar (glucose) in the blood is too low. Hypoglycemia can happen in people who do or do not have diabetes. It can develop quickly, and it can be a medical emergency. For most people with diabetes, a blood glucose level below 70 mg/dL (3.9 mmol/L) is considered hypoglycemia. Glucose is a type of sugar that provides the body's main source of energy. Certain hormones (insulin and glucagon) control the level of glucose in the blood. Insulin lowers blood glucose, and glucagon raises blood glucose. Hypoglycemia can result from having too much insulin in the bloodstream, or from not eating enough food that contains glucose. You may also have reactive hypoglycemia, which happens within 4 hours after eating a meal. What are the causes? Hypoglycemia occurs most often in people who have diabetes and may be caused by:  Diabetes medicine.  Not eating enough, or not eating often enough.  Increased physical activity.  Drinking alcohol on an empty stomach. If you do not have diabetes, hypoglycemia may be caused by:  A tumor in the pancreas.  Not eating enough, or not eating for long periods at a time (fasting).  A severe infection or illness.  Certain medicines. What increases the risk? Hypoglycemia is more likely to develop in:  People who have diabetes and take medicines to lower blood  glucose.  People who abuse alcohol.  People who have a severe illness. What are the signs or symptoms? Mild symptoms Mild hypoglycemia may not cause any symptoms. If you do have symptoms, they may include:  Hunger.  Anxiety.  Sweating and feeling clammy.  Dizziness or feeling light-headed.  Sleepiness.  Nausea.  Increased heart rate.  Headache.  Blurry vision.  Irritability.  Tingling or numbness around the mouth, lips, or tongue.  A change in coordination.  Restless sleep. Moderate symptoms Moderate hypoglycemia can cause:  Mental confusion and poor judgment.  Behavior changes.  Weakness.  Irregular heartbeat. Severe symptoms Severe hypoglycemia is a medical emergency. It can cause:  Fainting.  Seizures.  Loss of consciousness (coma).  Death. How is this diagnosed? Hypoglycemia is diagnosed with a blood test to measure your blood glucose level. This blood test is done while you are having symptoms. Your health care provider may also do a physical exam and review your medical history. How is this treated? This condition can often be treated by immediately eating or drinking something that contains sugar, such as:  Fruit juice, 4-6 oz (120-150 mL).  Regular soda (not diet soda), 4-6 oz (120-150 mL).  Low-fat milk, 4 oz (120 mL).  Several pieces of hard candy.  Sugar or honey, 1 Tbsp (15 mL). Treating hypoglycemia if you have diabetes If you are alert and able to swallow safely, follow the 15:15 rule:  Take 15 grams of a rapid-acting carbohydrate. Talk with your health care provider about how  much you should take.  Rapid-acting options include: ? Glucose pills (take 15 grams). ? 6-8 pieces of hard candy. ? 4-6 oz (120-150 mL) of fruit juice. ? 4-6 oz (120-150 mL) of regular (not diet) soda. ? 1 Tbsp (15 mL) honey or sugar.  Check your blood glucose 15 minutes after you take the carbohydrate.  If the repeat blood glucose level is still at  or below 70 mg/dL (3.9 mmol/L), take 15 grams of a carbohydrate again.  If your blood glucose level does not increase above 70 mg/dL (3.9 mmol/L) after 3 tries, seek emergency medical care.  After your blood glucose level returns to normal, eat a meal or a snack within 1 hour.  Treating severe hypoglycemia Severe hypoglycemia is when your blood glucose level is at or below 54 mg/dL (3 mmol/L). Severe hypoglycemia is a medical emergency. Get medical help right away. If you have severe hypoglycemia and you cannot eat or drink, you may need an injection of glucagon. A family member or close friend should learn how to check your blood glucose and how to give you a glucagon injection. Ask your health care provider if you need to have an emergency glucagon injection kit available. Severe hypoglycemia may need to be treated in a hospital. The treatment may include getting glucose through an IV. You may also need treatment for the cause of your hypoglycemia. Follow these instructions at home:  General instructions  Take over-the-counter and prescription medicines only as told by your health care provider.  Monitor your blood glucose as told by your health care provider.  Limit alcohol intake to no more than 1 drink a day for nonpregnant women and 2 drinks a day for men. One drink equals 12 oz of beer (355 mL), 5 oz of wine (148 mL), or 1 oz of hard liquor (44 mL).  Keep all follow-up visits as told by your health care provider. This is important. If you have diabetes:  Always have a rapid-acting carbohydrate snack with you to treat low blood glucose.  Follow your diabetes management plan as directed. Make sure you: ? Know the symptoms of hypoglycemia. It is important to treat it right away to prevent it from becoming severe. ? Take your medicines as directed. ? Follow your exercise plan. ? Follow your meal plan. Eat on time, and do not skip meals. ? Check your blood glucose as often as  directed. Always check before and after exercise. ? Follow your sick day plan whenever you cannot eat or drink normally. Make this plan in advance with your health care provider.  Share your diabetes management plan with people in your workplace, school, and household.  Check your urine for ketones when you are ill and as told by your health care provider.  Carry a medical alert card or wear medical alert jewelry. Contact a health care provider if:  You have problems keeping your blood glucose in your target range.  You have frequent episodes of hypoglycemia. Get help right away if:  You continue to have hypoglycemia symptoms after eating or drinking something containing glucose.  Your blood glucose is at or below 54 mg/dL (3 mmol/L).  You have a seizure.  You faint. These symptoms may represent a serious problem that is an emergency. Do not wait to see if the symptoms will go away. Get medical help right away. Call your local emergency services (911 in the [place] Summary  Hypoglycemia occurs when the level of sugar (glucose) in the blood  is too low.  Hypoglycemia can happen in people who do or do not have diabetes. It can develop quickly, and it can be a medical emergency.  Make sure you know the symptoms of hypoglycemia and how to treat it.  Always have a rapid-acting carbohydrate snack with you to treat low blood sugar. This information is not intended to replace advice given to you by your health care provider. Make sure you discuss any questions you have with your health care provider. Document Released: 07/27/2005 Document Revised: 01/18/2018 Document Reviewed: 08/30/2015 Elsevier Interactive Patient Education  [employer].   If you have lab work done today you will be contacted with your lab results within the next 2 weeks.  If you have not heard from [place] then please contact us. The fastest way to get your results is to register for My Chart.   IF you received an  x-ray today, you will receive an invoice from [facility]. Please contact [facility] at [phone] with questions or concerns regarding your invoice.   IF you received labwork today, you will receive an invoice from [place]. Please contact [facility] at [phone] with questions or concerns regarding your invoice.   Our billing staff will not be able to assist you with questions regarding bills from these companies.  You will be contacted with the lab results as soon as they are available. The fastest way to get your results is to activate your My Chart account. Instructions are located on the last page of this paperwork. If you have not heard from [place] regarding the results in 2 weeks, please contact this office.

## 2018-10-06 NOTE — Addendum Note (Signed)
Addended by: Norton Blizzard R on: 0/23/3435 09:40 AM   Modules accepted: Orders

## 2018-10-06 NOTE — Progress Notes (Signed)
Subjective:    Patient ID: Logan [name], male    DOB: 26-Oct-1970, 48 y.o.   MRN: [id number]  HPI Logan [name] is a 48 y.o. male Presents today for: Chief Complaint  Patient presents with  . Diabetes    last seen here 12/519 was told to see a nutritionist. Last glucose was 225. Bs runs normal when i do not eat late at night last reading of 225 was when i had a late meal   Diabetes:  Metformin '1000mg'$  BID, continued on lantus 50 units last visit. Diet thought to be large contributor - referred to  Nutritionist. Appt 09/02/18 - diet changes advised.  Microalbumin:04/25/18 Optho, foot exam, pneumovax: up to date.  lipitor for statin, lisinopril for ACE.  Home readings - 150 range when eating well, lowest 125. High in upper 200's, thinks when eating late. Largest meal in evening.  Ate donut and 2 cookies last night.  Past 14 day average - 220.  No hypoglycemia.  Fasting today.   Lab Results  Component Value Date   HGBA1C 11.6 (H) 07/14/2018   HGBA1C 12.1 (H) 03/14/2018   HGBA1C 12.2 11/15/2017   Lab Results  Component Value Date   MICROALBUR 1.7 07/17/2015   LDLCALC 92 03/14/2018   CREATININE 0.83 03/14/2018   Hyperlipidemia:  Lab Results  Component Value Date   CHOL 143 03/14/2018   HDL 37 (L) 03/14/2018   LDLCALC 92 03/14/2018   TRIG 70 03/14/2018   CHOLHDL 3.9 03/14/2018   Lab Results  Component Value Date   ALT 40 03/14/2018   AST 24 03/14/2018   ALKPHOS 109 03/14/2018   BILITOT 0.5 03/14/2018  lipitor '10mg'$  qd  Hypertension: BP Readings from Last 3 Encounters:  10/06/18 121/80  07/14/18 136/83  04/25/18 120/79   Lab Results  Component Value Date   CREATININE 0.83 03/14/2018  lisinopril '10mg'$  qd.    Patient Active Problem List   Diagnosis Date Noted  . Acute sinusitis 07/06/2017  . Asthmatic bronchitis 07/06/2017  . Allergic rhinitis with a nonallergic component 12/08/2016  . Psoriasis 12/08/2016  . Diabetes ([place]) 05/31/2012  . Hypertension  05/31/2012   Past Medical History:  Diagnosis Date  . Asthmatic bronchitis 07/06/2017  . Diabetes mellitus    takes Janumet and Invokana daily  . Hernia, umbilical   . Hypertension    takes Lisinopril daily  . Plaque psoriasis   . Seasonal allergies    takes Claritin daily and uses Flonase daily   Past Surgical History:  Procedure Laterality Date  . INSERTION OF MESH N/A 05/07/2016   Procedure: INSERTION OF MESH;  Surgeon: Erroll Luna, MD;  Location: [facility];  Service: General;  Laterality: N/A;  . TYMPANOSTOMY TUBE PLACEMENT    . VENTRAL HERNIA REPAIR N/A 05/07/2016   Procedure: REPAIR VENTRAL HERNIA;  Surgeon: Erroll Luna, MD;  Location: [facility];  Service: General;  Laterality: N/A;   Allergies  Allergen Reactions  . Penicillins Hives and Rash    Has patient had a PCN reaction causing immediate rash, facial/tongue/throat swelling, SOB or lightheadedness with hypotension: Yes Has patient had a PCN reaction causing severe rash involving mucus membranes or skin necrosis: No Has patient had a PCN reaction that required hospitalization No Has patient had a PCN reaction occurring within the last 10 years: No If all of the above answers are "NO", then may proceed with Cephalosporin use.    Prior to Admission medications   Medication Sig Start Date End Date Taking?  Authorizing Provider  aspirin EC 81 MG tablet Take 81 mg by mouth daily.   Yes [provider]  atorvastatin (LIPITOR) 20 MG tablet Take 1 tablet (20 mg total) by mouth daily at 6 PM. 07/14/18  Yes [name], MD  B Complex-C (B-COMPLEX WITH VITAMIN C) tablet Take 1 tablet by mouth daily.   Yes [provider]  B-D UF III MINI PEN NEEDLES 31G X 5 MM MISC USE AS DIRECTED DAILY 02/09/18  Yes [name], MD  BAYER MICROLET LANCETS lancets TEST UP TO 3 TIMES A DAY 01/11/18  Yes [name], MD  blood glucose meter kit and supplies Dispense based on patient and insurance preference. Use up to 3  times daily, uncontrolled diabetes.  Contour Next meter with test strips and lancets. 11/15/17  Yes [name], MD  CONTOUR NEXT TEST test strip TEST UP TO 3 TIMES EVERY DAY 03/09/18  Yes [name], MD  fluticasone [facility]) 50 MCG/ACT nasal spray Place 2 sprays into both nostrils daily. 07/05/17  Yes Posey Boyer, MD  Insulin Glargine (LANTUS SOLOSTAR) 100 UNIT/ML Solostar Pen Inject 50 Units into the skin daily. 07/14/18  Yes [name], MD  lisinopril (PRINIVIL,ZESTRIL) 10 MG tablet Take 1 tablet (10 mg total) by mouth daily. 07/14/18  Yes [name], MD  loratadine (CLARITIN) 10 MG tablet Take 10 mg by mouth daily.   Yes [provider]  metFORMIN (GLUCOPHAGE) 1000 MG tablet Take 1 tablet (1,000 mg total) by mouth 2 (two) times daily with a meal. 07/14/18  Yes [name], MD  Multiple Vitamin (MULTIVITAMIN WITH MINERALS) TABS tablet Take 1 tablet by mouth daily.   Yes [provider]  vitamin B-12 (CYANOCOBALAMIN) 1000 MCG tablet Take 1,000 mcg by mouth daily.   Yes [provider]  vitamin C (ASCORBIC ACID) 500 MG tablet Take 500 mg by mouth daily.   Yes [provider]   Social History   Socioeconomic History  . Marital status: Single    Spouse name: Not on file  . Number of children: Not on file  . Years of education: Not on file  . Highest education level: Not on file  Occupational History  . Not on file  Social Needs  . Financial resource strain: Not on file  . Food insecurity:    Worry: Not on file    Inability: Not on file  . Transportation needs:    Medical: Not on file    Non-medical: Not on file  Tobacco Use  . Smoking status: Never Smoker  . Smokeless tobacco: Never Used  Substance and Sexual Activity  . Alcohol use: Yes    Comment: occ glass of wine.2-3 per month  . Drug use: No  . Sexual activity: Never  Lifestyle  . Physical activity:    Days per week: Not on file    Minutes per session:  Not on file  . Stress: Not on file  Relationships  . Social connections:    Talks on phone: Not on file    Gets together: Not on file    Attends religious service: Not on file    Active member of club or organization: Not on file    Attends meetings of clubs or organizations: Not on file    Relationship status: Not on file  . Intimate partner violence:    Fear of current or ex partner: Not on file    Emotionally abused: Not  on file    Physically abused: Not on file    Forced sexual activity: Not on file  Other Topics Concern  . Not on file  Social History Narrative  . Not on file    Review of Systems Per HPI.     Objective:   Physical Exam Vitals:   10/06/18 0830  BP: 121/80  Pulse: 93  Resp: 16  Temp: 98.5 F (36.9 C)  TempSrc: Oral  SpO2: 97%  Weight: 184 lb (83.5 kg)  Height: '5\' 4"'$  (1.626 m)    Results for orders placed or performed in visit on 10/06/18  POCT glucose (manual entry)  Result Value Ref Range   POC Glucose 313 (A) 70 - 99 mg/dl       Assessment & Plan:   Logan [name] is a 48 y.o. male Essential hypertension - Plan: Comprehensive metabolic panel, lisinopril (PRINIVIL,ZESTRIL) 10 MG tablet  Type 2 diabetes mellitus with hyperglycemia, with long-term current use of insulin ([place]) - Plan: POCT glucose (manual entry), Hemoglobin A1c, Insulin Glargine (LANTUS SOLOSTAR) 100 UNIT/ML Solostar Pen, metFORMIN (GLUCOPHAGE) 1000 MG tablet, insulin aspart (NOVOLOG PENFILL) cartridge  Hyperlipidemia, unspecified hyperlipidemia type - Plan: Lipid Panel, atorvastatin (LIPITOR) 20 MG tablet  Uncontrolled DM. Some diet component, but stil needs change in meds.   -increase lantus to 52 units, then 54 in few days if persistent elevation.   -Cont metformin same dose, stain and ace - labs pending.   -add novolog 5 with dinner. hypoglycemic precautions.   - recheck 3 weeks with readings to adjust further.   - risks of uncontrolled DM and importance of diet  adherence reviewed.   Meds ordered this encounter  Medications  . lisinopril (PRINIVIL,ZESTRIL) 10 MG tablet    Sig: Take 1 tablet (10 mg total) by mouth daily.    Dispense:  90 tablet    Refill:  1  . Insulin Glargine (LANTUS SOLOSTAR) 100 UNIT/ML Solostar Pen    Sig: Inject 52 Units into the skin daily.    Dispense:  15 pen    Refill:  2  . atorvastatin (LIPITOR) 20 MG tablet    Sig: Take 1 tablet (20 mg total) by mouth daily at 6 PM.    Dispense:  90 tablet    Refill:  1  . metFORMIN (GLUCOPHAGE) 1000 MG tablet    Sig: Take 1 tablet (1,000 mg total) by mouth 2 (two) times daily with a meal.    Dispense:  180 tablet    Refill:  1  . insulin aspart (NOVOLOG PENFILL) cartridge    Sig: Inject 5 Units into the skin daily before supper.    Dispense:  15 mL    Refill:  1   Patient Instructions    I will check A1c, but based on reading today and averages, will need more medicine. Increase lantus to 52 units for now, then if remaining in 200's in next 3 days increase to 54 units.  Start 5 units of Novolog insulin with largest meal.  Watch for low blood sugars, although unlikely. See below.  Recheck in next 2-3 weeks. Bring record of fasting and 2 hour blood sugars after breakfast, lunch or dinner (vary time per day and check 1-2 readings per day)  Return to the clinic or go to the nearest emergency room if any of your symptoms worsen or new symptoms occur.    If you have lab work done today you will be contacted with your lab results within the  next 2 weeks.  If you have not heard from [place] then please contact us. The fastest way to get your results is to register for My Chart.   IF you received an x-ray today, you will receive an invoice from [facility]. Please contact [facility] Radiology at [phone] with questions or concerns regarding your invoice.   IF you received labwork today, you will receive an invoice from [place]. Please contact [facility] at [phone]  with questions or concerns regarding your invoice.   Our billing staff will not be able to assist you with questions regarding bills from these companies.  You will be contacted with the lab results as soon as they are available. The fastest way to get your results is to activate your My Chart account. Instructions are located on the last page of this paperwork. If you have not heard from [place] regarding the results in 2 weeks, please contact this office.       Signed,   [name], MD [facility].  10/06/18 9:19 AM

## 2018-10-07 LAB — LIPID PANEL
Chol/HDL Ratio: 4 ratio (ref 0.0–5.0)
Cholesterol, Total: 131 mg/dL (ref 100–199)
HDL: 33 mg/dL — ABNORMAL LOW (ref >39)
LDL Calculated: 70 mg/dL (ref 0–99)
Triglycerides: 139 mg/dL (ref 0–149)
VLDL Cholesterol Cal: 28 mg/dL (ref 5–40)

## 2018-10-07 LAB — HEMOGLOBIN A1C
Est. average glucose Bld gHb Est-mCnc: 272 mg/dL
Hgb A1c MFr Bld: 11.1 % — ABNORMAL HIGH (ref 4.8–5.6)

## 2018-10-07 LAB — COMPREHENSIVE METABOLIC PANEL
ALT: 38 IU/L (ref 0–44)
AST: 19 IU/L (ref 0–40)
Albumin/Globulin Ratio: 1.7 (ref 1.2–2.2)
Albumin: 4.5 g/dL (ref 4.0–5.0)
Alkaline Phosphatase: 145 IU/L — ABNORMAL HIGH (ref 39–117)
BUN/Creatinine Ratio: 17 (ref 9–20)
BUN: 15 mg/dL (ref 6–24)
Bilirubin Total: 0.2 mg/dL (ref 0.0–1.2)
CO2: 20 mmol/L (ref 20–29)
Calcium: 9.2 mg/dL (ref 8.7–10.2)
Chloride: 100 mmol/L (ref 96–106)
Creatinine, Ser: 0.87 mg/dL (ref 0.76–1.27)
GFR calc Af Amer: 117 mL/min/{1.73_m2} (ref >59)
GFR calc non Af Amer: 101 mL/min/{1.73_m2} (ref >59)
Globulin, Total: 2.7 g/dL (ref 1.5–4.5)
Glucose: 285 mg/dL — ABNORMAL HIGH (ref 65–99)
Potassium: 4.8 mmol/L (ref 3.5–5.2)
Sodium: 135 mmol/L (ref 134–144)
Total Protein: 7.2 g/dL (ref 6.0–8.5)

## 2018-10-10 ENCOUNTER — Telehealth: Payer: Self-pay | Admitting: Family Medicine

## 2018-10-10 NOTE — Telephone Encounter (Signed)
Copied from [place] [id number]. Topic: Quick Communication - Rx Refill/Question >> Oct 10, 2018  3:12 PM Scherrie Gerlach wrote: Medication: insulin aspart (NOVOLOG PENFILL) cartridge  Pt states this was supposed to be novolog pen, and a cartridge was sent in. Notes state Start 5 units of Novolog insulin with largest meal.  Pt does not have anything to put into this cartridge.  Pt needs asap. Would like to pick up today  [id number] [address], [place] - [place] [phone] (Phone) [phone] (Fax)

## 2018-10-10 NOTE — Telephone Encounter (Signed)
Contacted [facility] who states the prescription for the Novolog cartridge was put on hold because the pt states he was using the Novolog Flex pen and not the cartridge. Prescription for Novolog flex pen is not on the pt's current med list.

## 2018-10-11 ENCOUNTER — Other Ambulatory Visit: Payer: Self-pay

## 2018-10-11 DIAGNOSIS — Z794 Long term (current) use of insulin: Principal | ICD-10-CM

## 2018-10-11 DIAGNOSIS — E1165 Type 2 diabetes mellitus with hyperglycemia: Secondary | ICD-10-CM

## 2018-10-11 MED ORDER — INSULIN ASPART 100 UNIT/ML FLEXPEN: 5.0000 [IU] | PEN_INJECTOR | Freq: Every day | SUBCUTANEOUS | 11 refills | Status: DC

## 2018-10-11 NOTE — Telephone Encounter (Signed)
Please advise, is there something you would like for me to sent to the pharmacy for this pt. The pharmacy was confused about the last RX.

## 2018-10-11 NOTE — Telephone Encounter (Signed)
Rx for Novolog flex-pen has been sent to pharmacy.

## 2018-10-11 NOTE — Telephone Encounter (Signed)
See last office visit.  I planned on him starting NovoLog with meal.  Please check with pharmacy to see how they have NovoLog to dispense.  If they  have a NovoLog pen that is what I would like to have him use for ease of use.  Thanks.

## 2018-10-13 ENCOUNTER — Encounter: Payer: Self-pay | Admitting: Family Medicine

## 2018-11-01 ENCOUNTER — Other Ambulatory Visit: Payer: Self-pay | Admitting: Family Medicine

## 2018-11-01 DIAGNOSIS — E1165 Type 2 diabetes mellitus with hyperglycemia: Secondary | ICD-10-CM

## 2018-11-01 MED ORDER — INSULIN PEN NEEDLE 31G X 5 MM MISC: 1 refills | Status: DC

## 2018-11-03 ENCOUNTER — Telehealth (INDEPENDENT_AMBULATORY_CARE_PROVIDER_SITE_OTHER): Payer: BLUE CROSS/BLUE SHIELD | Admitting: Family Medicine

## 2018-11-03 ENCOUNTER — Other Ambulatory Visit: Payer: Self-pay

## 2018-11-03 DIAGNOSIS — E1165 Type 2 diabetes mellitus with hyperglycemia: Secondary | ICD-10-CM

## 2018-11-03 DIAGNOSIS — Z794 Long term (current) use of insulin: Secondary | ICD-10-CM

## 2018-11-03 DIAGNOSIS — J019 Acute sinusitis, unspecified: Secondary | ICD-10-CM | POA: Diagnosis not present

## 2018-11-03 DIAGNOSIS — R748 Abnormal levels of other serum enzymes: Secondary | ICD-10-CM

## 2018-11-03 DIAGNOSIS — R0981 Nasal congestion: Secondary | ICD-10-CM

## 2018-11-03 MED ORDER — FLUTICASONE PROPIONATE 50 MCG/ACT NA SUSP: 1.0000 | Freq: Every day | NASAL | 6 refills | Status: DC

## 2018-11-03 MED ORDER — INSULIN ASPART 100 UNIT/ML FLEXPEN: 5.0000 [IU] | PEN_INJECTOR | Freq: Three times a day (TID) | SUBCUTANEOUS | 11 refills | Status: DC

## 2018-11-03 NOTE — Progress Notes (Signed)
Patient was last seen here 10/06/18. Labs at that time was abnormal A1c was 11.6. The only other concern the patient has at this time is scabbing of the right nostril which also make it hard to breath out of right nostril at times. Patient did say he have seen ENT specialist years ago. May need to need a referral to go to see ENT again for nasal issues. Referral has been pend for your finial approval. Patient also would like to talk to you to clarify if  Flonase and claritin do the same thing and do he need to continue doing both. He stated he is not doing both at this time until he speak with you. Patient also have an appt tomorrow and was wondering do he still need to come into the office? I did tell him to keep that appt tomorrow cause he need to come in for labs only.

## 2018-11-03 NOTE — Progress Notes (Signed)
Virtual Visit via Telephone Note  I connected with Logan [name] on 11/03/18 at  8:20 AM EDT by telephone and verified that I am speaking with the correct person using two identifiers.   I discussed the limitations, risks, security and privacy concerns of performing an evaluation and management service by telephone and the availability of in person appointments. I also discussed with the patient that there may be a patient responsible charge related to this service. The patient expressed understanding and agreed to proceed. Consent obtained.   CC: Diabetes, crusting of the nose, allergies  History of Present Illness:  Diabetes:  Uncontrolled, with persistent decreased control at last visit. Lantus was increased to 52 units, then up to 54 units in few days of persistent elevation, was continued on metformin, statin and ACE inhibitor.  Added NovoLog 5 units with dinner with hypoglycemic precautions given.   On 5 novolog 5 units with dinner. 52-54 units of long acting Fasting today 150.  Eating some later meals, but readings in 200's if eating past 10pm, but fasting levels around 130-150 if eating earlier.  Daytime readings in high 200's to 300's after eating.  Lowest 125, no symptomatic lows.   Elevated alk phosphatase - plans on recheck in 4-6 weeks at follow up visit. No abd pain, n/v.   Lab Results  Component Value Date   HGBA1C 11.1 (H) 10/06/2018   HGBA1C 11.6 (H) 07/14/2018   HGBA1C 12.1 (H) 03/14/2018   Lab Results  Component Value Date   MICROALBUR 1.7 07/17/2015   LDLCALC 70 10/06/2018   CREATININE 0.87 10/06/2018   Nasal crusting: Ongoing issue for years, R inside nostril. Scabs up in evening and blocks airway. Has to clean it nightly to prevent blockage. No pain. No bleeding.  Seen by ENT years ago - had normal biopsy, cauterized.  Did do well for awhile, then returned after a year - drainage.  Brother had nostril operation for similar problem.  Some nasal  congestion, flonase recommended prior by ENT. Needs updated prescription. flonase helped. Would like to still see ent for area.       Patient Active Problem List   Diagnosis Date Noted  . Acute sinusitis 07/06/2017  . Asthmatic bronchitis 07/06/2017  . Allergic rhinitis with a nonallergic component 12/08/2016  . Psoriasis 12/08/2016  . Diabetes ([place]) 05/31/2012  . Hypertension 05/31/2012   Past Medical History:  Diagnosis Date  . Asthmatic bronchitis 07/06/2017  . Diabetes mellitus    takes Janumet and Invokana daily  . Hernia, umbilical   . Hypertension    takes Lisinopril daily  . Plaque psoriasis   . Seasonal allergies    takes Claritin daily and uses Flonase daily   Past Surgical History:  Procedure Laterality Date  . INSERTION OF MESH N/A 05/07/2016   Procedure: INSERTION OF MESH;  Surgeon: Erroll Luna, MD;  Location: [place];  Service: General;  Laterality: N/A;  . TYMPANOSTOMY TUBE PLACEMENT    . VENTRAL HERNIA REPAIR N/A 05/07/2016   Procedure: REPAIR VENTRAL HERNIA;  Surgeon: Erroll Luna, MD;  Location: [place];  Service: General;  Laterality: N/A;   Allergies  Allergen Reactions  . Penicillins Hives and Rash    Has patient had a PCN reaction causing immediate rash, facial/tongue/throat swelling, SOB or lightheadedness with hypotension: Yes Has patient had a PCN reaction causing severe rash involving mucus membranes or skin necrosis: No Has patient had a PCN reaction that required hospitalization No Has patient had a PCN reaction  occurring within the last 10 years: No If all of the above answers are "NO", then may proceed with Cephalosporin use.    Prior to Admission medications   Medication Sig Start Date End Date Taking? Authorizing Provider  aspirin EC 81 MG tablet Take 81 mg by mouth daily.   Yes [provider]  atorvastatin (LIPITOR) 20 MG tablet Take 1 tablet (20 mg total) by mouth daily at 6 PM. 10/06/18  Yes [name], MD  B Complex-C  (B-COMPLEX WITH VITAMIN C) tablet Take 1 tablet by mouth daily.   Yes [provider]  BAYER MICROLET LANCETS lancets TEST UP TO 3 TIMES A DAY 01/11/18  Yes [name], MD  blood glucose meter kit and supplies Dispense based on patient and insurance preference. Use up to 3 times daily, uncontrolled diabetes.  Contour Next meter with test strips and lancets. 11/15/17  Yes [name], MD  CONTOUR NEXT TEST test strip TEST UP TO 3 TIMES EVERY DAY 03/09/18  Yes [name], MD  fluticasone [facility]) 50 MCG/ACT nasal spray Place 2 sprays into both nostrils daily. 07/05/17  Yes Posey Boyer, MD  insulin aspart (NOVOLOG FLEXPEN) 100 UNIT/ML FlexPen Inject 5 Units into the skin daily. 10/11/18  Yes [name], MD  Insulin Glargine (LANTUS SOLOSTAR) 100 UNIT/ML Solostar Pen Inject 52 Units into the skin daily. 10/06/18  Yes [name], MD  Insulin Pen Needle (B-D UF III MINI PEN NEEDLES) 31G X 5 MM MISC USE AS DIRECTED DAILY 11/01/18  Yes [name], MD  lisinopril (PRINIVIL,ZESTRIL) 10 MG tablet Take 1 tablet (10 mg total) by mouth daily. 10/06/18  Yes [name], MD  metFORMIN (GLUCOPHAGE) 1000 MG tablet Take 1 tablet (1,000 mg total) by mouth 2 (two) times daily with a meal. 10/06/18  Yes [name], MD  Multiple Vitamin (MULTIVITAMIN WITH MINERALS) TABS tablet Take 1 tablet by mouth daily.   Yes [provider]  vitamin B-12 (CYANOCOBALAMIN) 1000 MCG tablet Take 1,000 mcg by mouth daily.   Yes [provider]  vitamin C (ASCORBIC ACID) 500 MG tablet Take 500 mg by mouth daily.   Yes [provider]  loratadine (CLARITIN) 10 MG tablet Take 10 mg by mouth daily.    [provider]   Social History   Socioeconomic History  . Marital status: Single    Spouse name: Not on file  . Number of children: Not on file  . Years of education: Not on file  . Highest education level: Not on file  Occupational History  .  Not on file  Social Needs  . Financial resource strain: Not on file  . Food insecurity:    Worry: Not on file    Inability: Not on file  . Transportation needs:    Medical: Not on file    Non-medical: Not on file  Tobacco Use  . Smoking status: Never Smoker  . Smokeless tobacco: Never Used  Substance and Sexual Activity  . Alcohol use: Yes    Comment: occ glass of wine.2-3 per month  . Drug use: No  . Sexual activity: Never  Lifestyle  . Physical activity:    Days per week: Not on file    Minutes per session: Not on file  . Stress: Not on file  Relationships  . Social connections:    Talks on phone: Not on file    Gets together: Not on file  Attends religious service: Not on file    Active member of club or organization: Not on file    Attends meetings of clubs or organizations: Not on file    Relationship status: Not on file  . Intimate partner violence:    Fear of current or ex partner: Not on file    Emotionally abused: Not on file    Physically abused: Not on file    Forced sexual activity: Not on file  Other Topics Concern  . Not on file  Social History Narrative  . Not on file     Observations/Objective:   Assessment and Plan: Type 2 diabetes mellitus with hyperglycemia, with long-term current use of insulin ([place]) -Improving but still decreased control.  Denies hypoglycemia.  Change NovoLog to with meals not just dinner, continue same dose of Lantus.  Hypoglycemic precautions discussed, home readings by MyChart in the next 2 to 3 weeks, office visit in 4 weeks.  Acute rhinosinusitis - Plan: fluticasone (FLONASE) 50 MCG/ACT nasal spray, Ambulatory referral to ENT Nasal congestion - Plan: fluticasone (FLONASE) 50 MCG/ACT nasal spray, Ambulatory referral to ENT  -Possible allergic component, but prior procedure by ENT with cauterization, may have some regrowth of tissue.  Denies acute changes.  Will refer to ENT, restart Flonase nasal spray, RTC precautions if  acute changes or signs of infection   Elevated alkaline phosphatase level  -Asymptomatic, slight increased at last visit.  Plan for repeat testing at his upcoming visit in 1 month, RTC precautions if symptoms prior  Follow Up Instructions:    I discussed the assessment and treatment plan with the patient. The patient was provided an opportunity to ask questions and all were answered. The patient agreed with the plan and demonstrated an understanding of the instructions.   The patient was advised to call back or seek an in-person evaluation if the symptoms worsen or if the condition fails to improve as anticipated.  I provided 20 minutes of non-face-to-face time during this encounter.  Signed,   [name], MD [facility] [place].  11/03/18 9:12 AM

## 2018-11-03 NOTE — Addendum Note (Signed)
Addended by: [name] R on: 11/03/2018 10:31 AM   Modules accepted: Orders

## 2018-11-03 NOTE — Patient Instructions (Addendum)
  Use novolog 5 units with meals, continue lantus 54 units per day. Continue to check readings - fasting or 2 hours after meal and send me some numbers by Mychart in next 2-3 weeks.  Office visit in 4 weeks.  Can recheck alkaline phosphatase level at that time.  If any new symptoms let me know sooner.  I referred you to ear nose and throat specialist.  However for now try the Flonase nasal spray 1 to 2 sprays in each nostril to see if that helps with congestion, saline nasal spray over-the-counter can also be used if it does appear to be more dry or crusting.  If any swelling, redness, or pus/discharge, should be evaluated sooner for possible secondary infection.    If you have lab work done today you will be contacted with your lab results within the next 2 weeks.  If you have not heard from [place] then please contact us. The fastest way to get your results is to register for My Chart.   IF you received an x-ray today, you will receive an invoice from [facility]. Please contact [facility] at [phone] with questions or concerns regarding your invoice.   IF you received labwork today, you will receive an invoice from [place]. Please contact [facility] at [phone] with questions or concerns regarding your invoice.   Our billing staff will not be able to assist you with questions regarding bills from these companies.  You will be contacted with the lab results as soon as they are available. The fastest way to get your results is to activate your My Chart account. Instructions are located on the last page of this paperwork. If you have not heard from [place] regarding the results in 2 weeks, please contact this office.

## 2018-11-04 ENCOUNTER — Encounter: Payer: BLUE CROSS/BLUE SHIELD | Admitting: Family Medicine

## 2018-11-05 ENCOUNTER — Other Ambulatory Visit: Payer: Self-pay | Admitting: Family Medicine

## 2018-12-25 ENCOUNTER — Other Ambulatory Visit: Payer: Self-pay | Admitting: Family Medicine

## 2018-12-25 DIAGNOSIS — E1165 Type 2 diabetes mellitus with hyperglycemia: Secondary | ICD-10-CM

## 2018-12-28 ENCOUNTER — Other Ambulatory Visit: Payer: Self-pay | Admitting: Family Medicine

## 2019-01-16 ENCOUNTER — Other Ambulatory Visit: Payer: Self-pay | Admitting: Family Medicine

## 2019-01-16 DIAGNOSIS — Z794 Long term (current) use of insulin: Secondary | ICD-10-CM

## 2019-01-16 DIAGNOSIS — E1165 Type 2 diabetes mellitus with hyperglycemia: Secondary | ICD-10-CM

## 2019-02-08 ENCOUNTER — Other Ambulatory Visit: Payer: Self-pay | Admitting: Family Medicine

## 2019-02-08 DIAGNOSIS — Z794 Long term (current) use of insulin: Secondary | ICD-10-CM

## 2019-02-08 DIAGNOSIS — E1165 Type 2 diabetes mellitus with hyperglycemia: Secondary | ICD-10-CM

## 2019-02-23 ENCOUNTER — Encounter: Payer: Self-pay | Admitting: Family Medicine

## 2019-02-23 ENCOUNTER — Other Ambulatory Visit: Payer: Self-pay

## 2019-02-23 ENCOUNTER — Ambulatory Visit (INDEPENDENT_AMBULATORY_CARE_PROVIDER_SITE_OTHER): Payer: BC Managed Care – PPO | Admitting: Family Medicine

## 2019-02-23 VITALS — BP 117/77 | HR 61 | Temp 98.4°F | Resp 14 | Wt 189.8 lb

## 2019-02-23 DIAGNOSIS — I10 Essential (primary) hypertension: Secondary | ICD-10-CM | POA: Diagnosis not present

## 2019-02-23 DIAGNOSIS — E1165 Type 2 diabetes mellitus with hyperglycemia: Secondary | ICD-10-CM | POA: Diagnosis not present

## 2019-02-23 DIAGNOSIS — E785 Hyperlipidemia, unspecified: Secondary | ICD-10-CM

## 2019-02-23 DIAGNOSIS — Z1211 Encounter for screening for malignant neoplasm of colon: Secondary | ICD-10-CM

## 2019-02-23 DIAGNOSIS — M25539 Pain in unspecified wrist: Secondary | ICD-10-CM

## 2019-02-23 DIAGNOSIS — Z794 Long term (current) use of insulin: Secondary | ICD-10-CM

## 2019-02-23 MED ORDER — GABAPENTIN 300 MG PO CAPS: 300.0000 mg | ORAL_CAPSULE | Freq: Every day | ORAL | 1 refills | Status: DC

## 2019-02-23 NOTE — Progress Notes (Signed)
Subjective:    Patient ID: Logan [name], male    DOB: [birth year], 48 y.o.   MRN: [id number]  HPI Logan [name] is a 48 y.o. male Presents today for: Chief Complaint  Patient presents with  . chronic medical condition    diabetes-Patient states he eat late and his blood sugar was 200 this am. Blood presure has been running fine. Patient have issus with back and carpal tunnel but was gonna make an seperate appt for that at check out    Diabetes: Uncontrolled with hyperglycemia, microalbuminuria last visit telemedicine March 26.  Previously added NovoLog 5 units with dinner, Lantus 54 units.  Readings at that time fasting around 150, some elevated readings in the 200s but mostly in the mid 100s fasting, postprandial 2-3 100s.  Lowest 125. Continue same dose of Lantus to 54 units, recommended NovoLog with both meals at 5 units.  Continue metformin 1000 g twice daily.  Using novolog 5 units with meals - usually 2 meals per day.  Using 55-58 units lantus (if sugar high that night uses a few extra units).  Some late meals still due to work.  Home readings in 200's if eating after 10pm, not consistently using novolog after 10pm.  Home readings as above. 2 hour postprandial 225-275, occasional 300 if eating carbs.  Lowest reading 115 fasting. No symptomatic lows.   Last met with nutritionist 6 months ago, would like to meet again.  Less exercise with pandemic.  Wt Readings from Last 3 Encounters:  02/23/19 189 lb 12.8 oz (86.1 kg)  10/06/18 184 lb (83.5 kg)  07/14/18 182 lb 4.8 oz (82.7 kg)  a  Microalbumin: 32.7 on September 2019 Optho, foot exam, pneumovax: Up-to-date. On lisinopril for ACE inhibitor, Lipitor for statin, aspirin 81 mg daily.  Diabetic Foot Exam - Simple   No data filed      Lab Results  Component Value Date   HGBA1C 11.1 (H) 10/06/2018   HGBA1C 11.6 (H) 07/14/2018   HGBA1C 12.1 (H) 03/14/2018   Lab Results  Component Value Date   MICROALBUR 1.7  07/17/2015   LDLCALC 70 10/06/2018   CREATININE 0.87 10/06/2018   Fasting today.   HM: referal placed for colonoscopy.  brbpr after wiping at times.   History of carpal tunnel syndrome, eval by hand specialist in past, bracing and home PT recommended. Worse over past 3 months. Pain and tingling in forearms at times - more at night.    Patient Active Problem List   Diagnosis Date Noted  . Acute sinusitis 07/06/2017  . Asthmatic bronchitis 07/06/2017  . Allergic rhinitis with a nonallergic component 12/08/2016  . Psoriasis 12/08/2016  . Diabetes ([place]) 05/31/2012  . Hypertension 05/31/2012   Past Medical History:  Diagnosis Date  . Asthmatic bronchitis 07/06/2017  . Diabetes mellitus    takes Janumet and Invokana daily  . Hernia, umbilical   . Hypertension    takes Lisinopril daily  . Plaque psoriasis   . Seasonal allergies    takes Claritin daily and uses Flonase daily   Past Surgical History:  Procedure Laterality Date  . INSERTION OF MESH N/A 05/07/2016   Procedure: INSERTION OF MESH;  Surgeon: Erroll Luna, MD;  Location: [place];  Service: General;  Laterality: N/A;  . TYMPANOSTOMY TUBE PLACEMENT    . VENTRAL HERNIA REPAIR N/A 05/07/2016   Procedure: REPAIR VENTRAL HERNIA;  Surgeon: Erroll Luna, MD;  Location: [place];  Service: General;  Laterality: N/A;   Allergies  Allergen Reactions  . Penicillins Hives and Rash    Has patient had a PCN reaction causing immediate rash, facial/tongue/throat swelling, SOB or lightheadedness with hypotension: Yes Has patient had a PCN reaction causing severe rash involving mucus membranes or skin necrosis: No Has patient had a PCN reaction that required hospitalization No Has patient had a PCN reaction occurring within the last 10 years: No If all of the above answers are "NO", then may proceed with Cephalosporin use.    Prior to Admission medications   Medication Sig Start Date End Date Taking? Authorizing Provider  aspirin EC  81 MG tablet Take 81 mg by mouth daily.   Yes [provider]  atorvastatin (LIPITOR) 20 MG tablet Take 1 tablet (20 mg total) by mouth daily at 6 PM. 10/06/18  Yes [name], MD  B Complex-C (B-COMPLEX WITH VITAMIN C) tablet Take 1 tablet by mouth daily.   Yes [provider]  BAYER MICROLET LANCETS lancets TEST UP TO 3 TIMES A DAY 01/11/18  Yes [name], MD  blood glucose meter kit and supplies Dispense based on patient and insurance preference. Use up to 3 times daily, uncontrolled diabetes.  Contour Next meter with test strips and lancets. 11/15/17  Yes [name], MD  CONTOUR NEXT TEST test strip TEST UP TO 3 TIMES A DAY 12/28/18  Yes [name], MD  fluticasone [facility]) 50 MCG/ACT nasal spray Place 1-2 sprays into both nostrils daily. 11/03/18  Yes [name], MD  insulin aspart (NOVOLOG FLEXPEN) 100 UNIT/ML FlexPen Inject 5 Units into the skin 3 (three) times daily with meals. 11/03/18  Yes [name], MD  Insulin Pen Needle (B-D UF III MINI PEN NEEDLES) 31G X 5 MM MISC USE AS DIRECTED DAILY 11/01/18  Yes [name], MD  LANTUS SOLOSTAR 100 UNIT/ML Solostar Pen INJECT 52 UNITS INTO THE SKIN DAILY. 02/08/19  Yes [name], MD  lisinopril (PRINIVIL,ZESTRIL) 10 MG tablet Take 1 tablet (10 mg total) by mouth daily. 10/06/18  Yes [name], MD  loratadine (CLARITIN) 10 MG tablet Take 10 mg by mouth daily.   Yes [provider]  metFORMIN (GLUCOPHAGE) 1000 MG tablet Take 1 tablet (1,000 mg total) by mouth 2 (two) times daily with a meal. 10/06/18  Yes [name], MD  Multiple Vitamin (MULTIVITAMIN WITH MINERALS) TABS tablet Take 1 tablet by mouth daily.   Yes [provider]  vitamin B-12 (CYANOCOBALAMIN) 1000 MCG tablet Take 1,000 mcg by mouth daily.   Yes [provider]  vitamin C (ASCORBIC ACID) 500 MG tablet Take 500 mg by mouth daily.   Yes [provider]   Social History    Socioeconomic History  . Marital status: Single    Spouse name: Not on file  . Number of children: Not on file  . Years of education: Not on file  . Highest education level: Not on file  Occupational History  . Not on file  Social Needs  . Financial resource strain: Not on file  . Food insecurity    Worry: Not on file    Inability: Not on file  . Transportation needs    Medical: Not on file    Non-medical: Not on file  Tobacco Use  . Smoking status: Never Smoker  . Smokeless tobacco: Never Used  Substance and Sexual Activity  . Alcohol use: Yes    Comment: occ glass of wine.2-3 per month  .  Drug use: No  . Sexual activity: Never  Lifestyle  . Physical activity    Days per week: Not on file    Minutes per session: Not on file  . Stress: Not on file  Relationships  . Social Herbalist on phone: Not on file    Gets together: Not on file    Attends religious service: Not on file    Active member of club or organization: Not on file    Attends meetings of clubs or organizations: Not on file    Relationship status: Not on file  . Intimate partner violence    Fear of current or ex partner: Not on file    Emotionally abused: Not on file    Physically abused: Not on file    Forced sexual activity: Not on file  Other Topics Concern  . Not on file  Social History Narrative  . Not on file    Review of Systems  Constitutional: Negative for fatigue and unexpected weight change.  Eyes: Negative for visual disturbance.  Respiratory: Negative for cough, chest tightness and shortness of breath.   Cardiovascular: Negative for chest pain, palpitations and leg swelling.  Gastrointestinal: Negative for abdominal pain.  Musculoskeletal: Positive for arthralgias (forearms, with tingling at times, otehr pains in other areas at times that resolve. ).  Neurological: Negative for dizziness, light-headedness and headaches.       Objective:   Physical Exam Vitals signs  reviewed.  Constitutional:      Appearance: He is well-developed.  HENT:     Head: Normocephalic and atraumatic.  Eyes:     Pupils: Pupils are equal, round, and reactive to light.  Neck:     Vascular: No carotid bruit or JVD.  Cardiovascular:     Rate and Rhythm: Normal rate and regular rhythm.     Heart sounds: Normal heart sounds. No murmur.  Pulmonary:     Effort: Pulmonary effort is normal.     Breath sounds: Normal breath sounds. No rales.  Musculoskeletal:       Arms:  Skin:    General: Skin is warm and dry.  Neurological:     Mental Status: He is alert and oriented to person, place, and time.    Vitals:   02/23/19 0941  BP: 117/77  Pulse: 61  Resp: 14  Temp: 98.4 F (36.9 C)  TempSrc: Oral  SpO2: 98%  Weight: 189 lb 12.8 oz (86.1 kg)       Assessment & Plan:   Logan [name] is a 48 y.o. male Type 2 diabetes mellitus with hyperglycemia, with long-term current use of insulin ([place]) - Plan: HM Diabetes Foot Exam, Hemoglobin A1c, Referral to Nutrition and Diabetes Services, Ambulatory referral to Endocrinology  -Uncontrolled based on home readings.  Variations in timing of meals may be a component.  -Referred back to nutritionist for further guidance.  Increase Lantus to 60 units/day for now with continued NovoLog 5 units with meals for now until A1c results  -Refer to endocrinology for further guidance on medication adjustment.  Continue metformin same dose for now.  Essential hypertension - Plan: Comprehensive metabolic panel  -Stable, continue lisinopril  Hyperlipidemia, unspecified hyperlipidemia type - Plan: Lipid Panel  -Check labs, continue Lipitor same dose.  Special screening for malignant neoplasms, colon - Plan: Ambulatory referral to Gastroenterology  -Refer for screening colonoscopy  Arthralgia of forearm, unspecified laterality - Plan: gabapentin (NEURONTIN) 300 MG capsule  -Possible previous carpal tunnel syndrome,  but T[place] and [place] testing  reassuring.  Potential neuropathy related to uncontrolled diabetes.  Okay to continue wrist braces at home if needed, trial of gabapentin with potential side effects and risk discussed, and recheck in next few weeks.  Meds ordered this encounter  Medications  . gabapentin (NEURONTIN) 300 MG capsule    Sig: Take 1 capsule (300 mg total) by mouth at bedtime.    Dispense:  30 capsule    Refill:  1   Patient Instructions    Increase lantus to 60 units per day, continue novolog at current dose of 5 units with meals and metformin for now. I will refer you to endocrinologist and nutritionist to help with treatment of your diabetes. I will let you know if other changes needed once labs reviewed.  Recheck in next few weeks to discuss other concerns if needed. Return to the clinic or go to the nearest emergency room if any of your symptoms worsen or new symptoms occur.   Follow up with hand specialist if you are having more issues with carpal tunnel symptoms, but testing today reassuring. Ok to use wrist braces for now if needed.  Can also be related to nerve issue form uncontrolled diabetes. Try gabapentin once at night for now. recheck in next few weeks.  Stop gabapentin if new side effects. Recheck in few weeks.    If you have lab work done today you will be contacted with your lab results within the next 2 weeks.  If you have not heard from [place] then please contact us. The fastest way to get your results is to register for My Chart.   IF you received an x-ray today, you will receive an invoice from [facility]. Please contact [facility]iology at [phone] with questions or concerns regarding your invoice.   IF you received labwork today, you will receive an invoice from [place]. Please contact [facility] at [phone] with questions or concerns regarding your invoice.   Our billing staff will not be able to assist you with questions regarding bills from these companies.  You will  be contacted with the lab results as soon as they are available. The fastest way to get your results is to activate your My Chart account. Instructions are located on the last page of this paperwork. If you have not heard from [place] regarding the results in 2 weeks, please contact this office.       Signed,   [name], MD [facility] at [place].  02/23/19 9:30 PM

## 2019-02-23 NOTE — Patient Instructions (Addendum)
  Increase lantus to 60 units per day, continue novolog at current dose of 5 units with meals and metformin for now. I will refer you to endocrinologist and nutritionist to help with treatment of your diabetes. I will let you know if other changes needed once labs reviewed.  Recheck in next few weeks to discuss other concerns if needed. Return to the clinic or go to the nearest emergency room if any of your symptoms worsen or new symptoms occur.   Follow up with hand specialist if you are having more issues with carpal tunnel symptoms, but testing today reassuring. Ok to use wrist braces for now if needed.  Can also be related to nerve issue form uncontrolled diabetes. Try gabapentin once at night for now. recheck in next few weeks.  Stop gabapentin if new side effects. Recheck in few weeks.    If you have lab work done today you will be contacted with your lab results within the next 2 weeks.  If you have not heard from [place] then please contact us. The fastest way to get your results is to register for My Chart.   IF you received an x-ray today, you will receive an invoice from [facility]. Please contact [facility]iology at [phone] with questions or concerns regarding your invoice.   IF you received labwork today, you will receive an invoice from [place]. Please contact [facility] at [phone] with questions or concerns regarding your invoice.   Our billing staff will not be able to assist you with questions regarding bills from these companies.  You will be contacted with the lab results as soon as they are available. The fastest way to get your results is to activate your My Chart account. Instructions are located on the last page of this paperwork. If you have not heard from [place] regarding the results in 2 weeks, please contact this office.

## 2019-02-24 LAB — COMPREHENSIVE METABOLIC PANEL
ALT: 49 IU/L — ABNORMAL HIGH (ref 0–44)
AST: 29 IU/L (ref 0–40)
Albumin/Globulin Ratio: 1.8 (ref 1.2–2.2)
Albumin: 4.7 g/dL (ref 4.0–5.0)
Alkaline Phosphatase: 119 IU/L — ABNORMAL HIGH (ref 39–117)
BUN/Creatinine Ratio: 14 (ref 9–20)
BUN: 13 mg/dL (ref 6–24)
Bilirubin Total: 0.2 mg/dL (ref 0.0–1.2)
CO2: 20 mmol/L (ref 20–29)
Calcium: 9.6 mg/dL (ref 8.7–10.2)
Chloride: 102 mmol/L (ref 96–106)
Creatinine, Ser: 0.91 mg/dL (ref 0.76–1.27)
GFR calc Af Amer: 113 mL/min/{1.73_m2} (ref >59)
GFR calc non Af Amer: 98 mL/min/{1.73_m2} (ref >59)
Globulin, Total: 2.6 g/dL (ref 1.5–4.5)
Glucose: 180 mg/dL — ABNORMAL HIGH (ref 65–99)
Potassium: 5.2 mmol/L (ref 3.5–5.2)
Sodium: 139 mmol/L (ref 134–144)
Total Protein: 7.3 g/dL (ref 6.0–8.5)

## 2019-02-24 LAB — HEMOGLOBIN A1C
Est. average glucose Bld gHb Est-mCnc: 249 mg/dL
Hgb A1c MFr Bld: 10.3 % — ABNORMAL HIGH (ref 4.8–5.6)

## 2019-02-24 LAB — LIPID PANEL
Chol/HDL Ratio: 3.8 ratio (ref 0.0–5.0)
Cholesterol, Total: 140 mg/dL (ref 100–199)
HDL: 37 mg/dL — ABNORMAL LOW (ref >39)
LDL Calculated: 87 mg/dL (ref 0–99)
Triglycerides: 82 mg/dL (ref 0–149)
VLDL Cholesterol Cal: 16 mg/dL (ref 5–40)

## 2019-02-27 ENCOUNTER — Encounter: Payer: Self-pay | Admitting: Gastroenterology

## 2019-03-06 ENCOUNTER — Other Ambulatory Visit: Payer: Self-pay | Admitting: Family Medicine

## 2019-03-07 ENCOUNTER — Ambulatory Visit: Payer: BC Managed Care – PPO | Admitting: *Deleted

## 2019-03-07 ENCOUNTER — Other Ambulatory Visit: Payer: Self-pay | Admitting: Family Medicine

## 2019-03-07 ENCOUNTER — Other Ambulatory Visit: Payer: Self-pay

## 2019-03-07 VITALS — Ht 64.0 in | Wt 187.0 lb

## 2019-03-07 DIAGNOSIS — E1165 Type 2 diabetes mellitus with hyperglycemia: Secondary | ICD-10-CM

## 2019-03-07 DIAGNOSIS — Z1211 Encounter for screening for malignant neoplasm of colon: Secondary | ICD-10-CM

## 2019-03-07 MED ORDER — NA SULFATE-K SULFATE-MG SULF 17.5-3.13-1.6 GM/177ML PO SOLN: ORAL | 0 refills | Status: DC

## 2019-03-07 NOTE — Telephone Encounter (Signed)
Requested medication (s) are due for refill today: yes  Requested medication (s) are on the active medication list: yes  Last refill: 02/08/2019  Future visit scheduled: yes  Notes to clinic: dose changed per last OV    Requested Prescriptions  Pending Prescriptions Disp Refills   LANTUS SOLOSTAR 100 UNIT/ML Solostar Pen [Pharmacy Med Name: LANTUS SOLOSTAR 100 UNIT/ML] 15 mL 1    Sig: [place].     Endocrinology:  Diabetes - Insulins Failed - 03/07/2019 11:56 AM      Failed - HBA1C is between 0 and 7.9 and within 180 days    Hgb A1c MFr Bld  Date Value Ref Range Status  02/23/2019 10.3 (H) 4.8 - 5.6 % Final    Comment:             Prediabetes: 5.7 - 6.4          Diabetes: >6.4          Glycemic control for adults with diabetes: <7.0          Passed - Valid encounter within last 6 months    Recent Outpatient Visits          1 week ago Type 2 diabetes mellitus with hyperglycemia, with long-term current use of insulin [facility])   [facility] [place], Ranell Patrick, MD   4 months ago    [facility] [place], Ranell Patrick, MD   5 months ago Essential hypertension   [facility] [place], Ranell Patrick, MD   7 months ago Type 2 diabetes mellitus with hyperglycemia, with long-term current use of insulin [facility])   [facility] [place], Ranell Patrick, MD   10 months ago Type 2 diabetes mellitus with hyperglycemia, with long-term current use of insulin [facility])   [facility] [place], Ranell Patrick, MD      Future Appointments            In 1 week Carlota Raspberry Ranell Patrick, MD [facility] [place], [facility]   In 3 weeks [name], MD [facility] at [place], [facility]

## 2019-03-07 NOTE — Progress Notes (Signed)
Patient's pre-visit was done today over the phone with the patient due to COVID-19 pandemic. Name,DOB and address verified. Insurance verified. Packet of Prep instructions mailed to patient including copy of a consent form and pre-procedure patient acknowledgement form-pt is aware. Suprep $15 Coupon included. Patient understands to call us back with any questions or concerns.  Patient denies any allergies to eggs or soy. Patient denies any problems with anesthesia/sedation. Patient denies any oxygen use at home. Patient denies taking any diet/weight loss medications or blood thinners. EMMI education assisgned to patient on colonoscopy, this was explained and instructions given to patient. Pt is aware that care partner will wait in the car during procedure; if they feel like they will be too hot to wait in the car; they may wait in the lobby.  We want them to wear a mask (we do not have any that we can provide them), practice social distancing, and we will check their temperatures when they get here.  I did remind patient that their care partner needs to stay in the parking lot the entire time. Pt will wear mask into building.

## 2019-03-17 ENCOUNTER — Ambulatory Visit (INDEPENDENT_AMBULATORY_CARE_PROVIDER_SITE_OTHER): Payer: BC Managed Care – PPO | Admitting: Family Medicine

## 2019-03-17 ENCOUNTER — Other Ambulatory Visit (HOSPITAL_COMMUNITY)
Admission: RE | Admit: 2019-03-17 | Discharge: 2019-03-17 | Disposition: A | Discharge status: Home / Self Care | Payer: BC Managed Care – PPO | Source: Ambulatory Visit | Attending: Family Medicine | Admitting: Family Medicine

## 2019-03-17 ENCOUNTER — Ambulatory Visit (INDEPENDENT_AMBULATORY_CARE_PROVIDER_SITE_OTHER): Payer: BC Managed Care – PPO

## 2019-03-17 ENCOUNTER — Encounter: Payer: Self-pay | Admitting: Family Medicine

## 2019-03-17 ENCOUNTER — Other Ambulatory Visit: Payer: Self-pay

## 2019-03-17 VITALS — BP 117/77 | HR 94 | Temp 98.6°F | Resp 14 | Wt 192.6 lb

## 2019-03-17 DIAGNOSIS — E1165 Type 2 diabetes mellitus with hyperglycemia: Secondary | ICD-10-CM

## 2019-03-17 DIAGNOSIS — M25572 Pain in left ankle and joints of left foot: Secondary | ICD-10-CM

## 2019-03-17 DIAGNOSIS — Z0001 Encounter for general adult medical examination with abnormal findings: Secondary | ICD-10-CM | POA: Diagnosis not present

## 2019-03-17 DIAGNOSIS — Z113 Encounter for screening for infections with a predominantly sexual mode of transmission: Secondary | ICD-10-CM | POA: Diagnosis not present

## 2019-03-17 DIAGNOSIS — Z125 Encounter for screening for malignant neoplasm of prostate: Secondary | ICD-10-CM

## 2019-03-17 DIAGNOSIS — Z23 Encounter for immunization: Secondary | ICD-10-CM

## 2019-03-17 DIAGNOSIS — M25539 Pain in unspecified wrist: Secondary | ICD-10-CM

## 2019-03-17 DIAGNOSIS — Z0000 Encounter for general adult medical examination without abnormal findings: Secondary | ICD-10-CM

## 2019-03-17 DIAGNOSIS — Z794 Long term (current) use of insulin: Secondary | ICD-10-CM

## 2019-03-17 MED ORDER — SHINGRIX 50 MCG/0.5ML IM SUSR: 0.5000 mL | Freq: Once | INTRAMUSCULAR | 1 refills | Status: AC

## 2019-03-17 NOTE — Patient Instructions (Addendum)
Avoid late night eating, and see info below on diet with diabetes. Starches such as pasta will likely increase blood sugar significantly. Increase lantus to 62 units for now.    Low intensity exercise such as walking for now, with goal of 150 mins per week.   Exercises for ankle below, can recheck in few weeks. Will let you know if issues on xray.   Try gabpapentin nightly, but I will also refer you to hand/ortho for your forearm issues.   Keeping you healthy  Get these tests  Blood pressure- Have your blood pressure checked once a year by your healthcare provider.  Normal blood pressure is 120/80  Weight- Have your body mass index (BMI) calculated to screen for obesity.  BMI is a measure of body fat based on height and weight. You can also calculate your own BMI at [email or web address].  Cholesterol- Have your cholesterol checked every year.  Diabetes- Have your blood sugar checked regularly if you have high blood pressure, high cholesterol, have a family history of diabetes or if you are overweight.  Screening for Colon Cancer- Colonoscopy starting at age 14.  Screening may begin sooner depending on your family history and other health conditions. Follow up colonoscopy as directed by your Gastroenterologist.  Screening for Prostate Cancer- Both blood work (PSA) and a rectal exam help screen for Prostate Cancer.  Screening begins at age 64 with African-American men and at age 40 with Caucasian men.  Screening may begin sooner depending on your family history.  Take these medicines  Aspirin- One aspirin daily can help prevent Heart disease and Stroke.  Flu shot- Every fall.  Tetanus- Every 10 years.  Zostavax- Once after the age of 45 to prevent Shingles.  Pneumonia shot- Once after the age of 9; if you are younger than 56, ask your healthcare provider if you need a Pneumonia shot.  Take these steps  Don't smoke- If you do smoke, talk to your doctor about quitting.  For  tips on how to quit, go to www.smokefree.gov or call 1-800-QUIT-NOW.  Be physically active- Exercise 5 days a week for at least 30 minutes.  If you are not already physically active start slow and gradually work up to 30 minutes of moderate physical activity.  Examples of moderate activity include walking briskly, mowing the yard, dancing, swimming, bicycling, etc.  Eat a healthy diet- Eat a variety of healthy food such as fruits, vegetables, low fat milk, low fat cheese, yogurt, lean meant, poultry, fish, beans, tofu, etc. For more information go to www.thenutritionsource.org  Drink alcohol in moderation- Limit alcohol intake to less than two drinks a day. Never drink and drive.  Dentist- Brush and floss twice daily; visit your dentist twice a year.  Depression- Your emotional health is as important as your physical health. If you're feeling down, or losing interest in things you would normally enjoy please talk to your healthcare provider.  Eye exam- Visit your eye doctor every year.  Safe sex- If you may be exposed to a sexually transmitted infection, use a condom.  Seat belts- Seat belts can save your life; always wear one.  Smoke/Carbon Monoxide detectors- These detectors need to be installed on the appropriate level of your home.  Replace batteries at least once a year.  Skin cancer- When out in the sun, cover up and use sunscreen 15 SPF or higher.  Violence- If anyone is threatening you, please tell your healthcare provider.  Living Will/ Health care power of attorney- Speak  with your healthcare provider and family.  Ankle Exercises Ask your health care provider which exercises are safe for you. Do exercises exactly as told by your health care provider and adjust them as directed. It is normal to feel mild stretching, pulling, tightness, or mild discomfort as you do these exercises. Stop right away if you feel sudden pain or your pain gets worse. Do not begin these exercises until  told by your health care provider. Stretching and range-of-motion exercises These exercises warm up your muscles and joints and improve the movement and flexibility of your ankle. These exercises may also help to relieve pain. Dorsiflexion/plantar flexion  1. Sit with your __________ knee straight or bent. Do not rest your foot on anything. 2. Flex your __________ ankle to tilt the top of your foot toward your shin. This is called dorsiflexion. 3. Hold this position for __________ seconds. 4. Point your toes downward to tilt the top of your foot away from your shin. This is called plantar flexion. 5. Hold this position for __________ seconds. Repeat __________ times. Complete this exercise __________ times a day. Ankle alphabet  1. Sit with your __________ foot supported at your lower leg. ? Do not rest your foot on anything. ? Make sure your foot has room to move freely. 2. Think of your __________ foot as a paintbrush: ? Move your foot to trace each letter of the alphabet in the air. Keep your hip and knee still while you trace the letters. Trace every letter from A to Z. ? Make the letters as large as you can without causing or increasing any discomfort. Repeat __________ times. Complete this exercise __________ times a day. Passive ankle dorsiflexion This is an exercise in which something or someone moves your ankle for you. You do not move it yourself. 1. Sit on a chair that is placed on a non-carpeted surface. 2. Place your __________ foot on the floor, directly under your __________ knee. Extend your __________ leg for support. 3. Keeping your heel down, slide your __________ foot back toward the chair until you feel a stretch at your ankle or calf. If you do not feel a stretch, slide your buttocks forward to the edge of the chair while keeping your heel down. 4. Hold this stretch for __________ seconds. Repeat __________ times. Complete this exercise __________ times a  day. Strengthening exercises These exercises build strength and endurance in your ankle. Endurance is the ability to use your muscles for a long time, even after they get tired. Dorsiflexors These are muscles that lift your foot up. 1. Secure a rubber exercise band or tube to an object, such as a table leg, that will stay still when the band is pulled. Secure the other end around your __________ foot. 2. Sit on the floor, facing the object with your __________ leg extended. The band or tube should be slightly tense when your foot is relaxed. 3. Slowly flex your __________ ankle and toes to bring your foot toward your shin. 4. Hold this position for __________ seconds. 5. Slowly return your foot to the starting position, controlling the band as you do that. Repeat __________ times. Complete this exercise __________ times a day. Plantar flexors These are muscles that push your foot down. 1. Sit on the floor with your __________ leg extended. 2. Loop a rubber exercise band or tube around the ball of your __________ foot. The ball of your foot is on the walking surface, right under your toes. The band  or tube should be slightly tense when your foot is relaxed. 3. Slowly point your toes downward, pushing them away from you. 4. Hold this position for __________ seconds. 5. Slowly release the tension in the band or tube, controlling smoothly until your foot is back in the starting position. Repeat __________ times. Complete this exercise __________ times a day. Towel curls  1. Sit in a chair on a non-carpeted surface, and put your feet on the floor. 2. Place a towel in front of your feet. If told by your health care provider, add a __________ pound weight to the end of the towel. 3. Keeping your heel on the floor, put your __________ foot on the towel. 4. Pull the towel toward you by grabbing the towel with your toes and curling them under. Keep your heel on the floor. 5. Let your toes  relax. 6. Grab the towel again. Keep pulling the towel until it is completely underneath your foot. Repeat __________ times. Complete this exercise __________ times a day. Standing plantar flexion This is an exercise in which you use your toes to lift your body's weight while standing. 1. Stand with your feet shoulder-width apart. 2. Keep your weight spread evenly over the width of your feet while you rise up on your toes. Use a wall or table to steady yourself if needed, but try not to use it for support. 3. If this exercise is too easy, try these options: ? Shift your weight toward your __________ leg until you feel challenged. ? If told by your health care provider, lift your uninjured leg off the floor. 4. Hold this position for __________ seconds. Repeat __________ times. Complete this exercise __________ times a day. Tandem walking 1. Stand with one foot directly in front of the other. 2. Slowly raise your back foot up, lifting your heel before your toes, and place it directly in front of your other foot. 3. Continue to walk in this heel-to-toe way for __________ or for as long as told by your health care provider. Have a countertop or wall nearby to use if needed to keep your balance, but try not to hold onto anything for support. Repeat __________ times. Complete this exercise __________ times a day. This information is not intended to replace advice given to you by your health care provider. Make sure you discuss any questions you have with your health care provider. Document Released: 06/10/2005 Document Revised: 04/23/2018 Document Reviewed: 04/25/2018 Elsevier Patient Education  [place].   Diabetes Mellitus and Nutrition, Adult When you have diabetes (diabetes mellitus), it is very important to have healthy eating habits because your blood sugar (glucose) levels are greatly affected by what you eat and drink. Eating healthy foods in the appropriate amounts, at about the  same times every day, can help you:  Control your blood glucose.  Lower your risk of heart disease.  Improve your blood pressure.  Reach or maintain a healthy weight. Every person with diabetes is different, and each person has different needs for a meal plan. Your health care provider may recommend that you work with a diet and nutrition specialist (dietitian) to make a meal plan that is best for you. Your meal plan may vary depending on factors such as:  The calories you need.  The medicines you take.  Your weight.  Your blood glucose, blood pressure, and cholesterol levels.  Your activity level.  Other health conditions you have, such as heart or kidney disease. How do carbohydrates  affect me? Carbohydrates, also called carbs, affect your blood glucose level more than any other type of food. Eating carbs naturally raises the amount of glucose in your blood. Carb counting is a method for keeping track of how many carbs you eat. Counting carbs is important to keep your blood glucose at a healthy level, especially if you use insulin or take certain oral diabetes medicines. It is important to know how many carbs you can safely have in each meal. This is different for every person. Your dietitian can help you calculate how many carbs you should have at each meal and for each snack. Foods that contain carbs include:  Bread, cereal, rice, pasta, and crackers.  Potatoes and corn.  Peas, beans, and lentils.  Milk and yogurt.  Fruit and juice.  Desserts, such as cakes, cookies, ice cream, and candy. How does alcohol affect me? Alcohol can cause a sudden decrease in blood glucose (hypoglycemia), especially if you use insulin or take certain oral diabetes medicines. Hypoglycemia can be a life-threatening condition. Symptoms of hypoglycemia (sleepiness, dizziness, and confusion) are similar to symptoms of having too much alcohol. If your health care provider says that alcohol is safe  for you, follow these guidelines:  Limit alcohol intake to no more than 1 drink per day for nonpregnant women and 2 drinks per day for men. One drink equals 12 oz of beer, 5 oz of wine, or 1 oz of hard liquor.  Do not drink on an empty stomach.  Keep yourself hydrated with water, diet soda, or unsweetened iced tea.  Keep in mind that regular soda, juice, and other mixers may contain a lot of sugar and must be counted as carbs. What are tips for following this plan?  Reading food labels  Start by checking the serving size on the "Nutrition Facts" label of packaged foods and drinks. The amount of calories, carbs, fats, and other nutrients listed on the label is based on one serving of the item. Many items contain more than one serving per package.  Check the total grams (g) of carbs in one serving. You can calculate the number of servings of carbs in one serving by dividing the total carbs by 15. For example, if a food has 30 g of total carbs, it would be equal to 2 servings of carbs.  Check the number of grams (g) of saturated and trans fats in one serving. Choose foods that have low or no amount of these fats.  Check the number of milligrams (mg) of salt (sodium) in one serving. Most people should limit total sodium intake to less than 2,300 mg per day.  Always check the nutrition information of foods labeled as "low-fat" or "nonfat". These foods may be higher in added sugar or refined carbs and should be avoided.  Talk to your dietitian to identify your daily goals for nutrients listed on the label. Shopping  Avoid buying canned, premade, or processed foods. These foods tend to be high in fat, sodium, and added sugar.  Shop around the outside edge of the grocery store. This includes fresh fruits and vegetables, bulk grains, fresh meats, and fresh dairy. Cooking  Use low-heat cooking methods, such as baking, instead of high-heat cooking methods like deep frying.  Cook using healthy  oils, such as olive, canola, or sunflower oil.  Avoid cooking with butter, cream, or high-fat meats. Meal planning  Eat meals and snacks regularly, preferably at the same times every day. Avoid going long periods of  time without eating.  Eat foods high in fiber, such as fresh fruits, vegetables, beans, and whole grains. Talk to your dietitian about how many servings of carbs you can eat at each meal.  Eat 4-6 ounces (oz) of lean protein each day, such as lean meat, chicken, fish, eggs, or tofu. One oz of lean protein is equal to: ? 1 oz of meat, chicken, or fish. ? 1 egg. ?  cup of tofu.  Eat some foods each day that contain healthy fats, such as avocado, nuts, seeds, and fish. Lifestyle  Check your blood glucose regularly.  Exercise regularly as told by your health care provider. This may include: ? 150 minutes of moderate-intensity or vigorous-intensity exercise each week. This could be brisk walking, biking, or water aerobics. ? Stretching and doing strength exercises, such as yoga or weightlifting, at least 2 times a week.  Take medicines as told by your health care provider.  Do not use any products that contain nicotine or tobacco, such as cigarettes and e-cigarettes. If you need help quitting, ask your health care provider.  Work with a Social worker or diabetes educator to identify strategies to manage stress and any emotional and social challenges. Questions to ask a health care provider  Do I need to meet with a diabetes educator?  Do I need to meet with a dietitian?  What number can I call if I have questions?  When are the best times to check my blood glucose? Where to find more information:  American Diabetes Association: diabetes.org  Academy of Nutrition and Dietetics: www.eatright.[employer] of Diabetes and Digestive and Kidney Diseases (NIH): [employer] Summary  A healthy meal plan will help you control your blood glucose and maintain a  healthy lifestyle.  Working with a diet and nutrition specialist (dietitian) can help you make a meal plan that is best for you.  Keep in mind that carbohydrates (carbs) and alcohol have immediate effects on your blood glucose levels. It is important to count carbs and to use alcohol carefully. This information is not intended to replace advice given to you by your health care provider. Make sure you discuss any questions you have with your health care provider. Document Released: 04/23/2005 Document Revised: 07/09/2017 Document Reviewed: 08/31/2016 Elsevier Patient Education  [employer].   If you have lab work done today you will be contacted with your lab results within the next 2 weeks.  If you have not heard from [place] then please contact us. The fastest way to get your results is to register for My Chart.   IF you received an x-ray today, you will receive an invoice from [facility]. Please contact [facility] at [phone] with questions or concerns regarding your invoice.   IF you received labwork today, you will receive an invoice from [place] [facility]. Please contact [facility] at [phone] with questions or concerns regarding your invoice.   Our billing staff will not be able to assist you with questions regarding bills from these companies.  You will be contacted with the lab results as soon as they are available. The fastest way to get your results is to activate your My Chart account. Instructions are located on the last page of this paperwork. If you have not heard from [place] regarding the results in 2 weeks, please contact this office.

## 2019-03-17 NOTE — Progress Notes (Signed)
Subjective:    Patient ID: Logan [name], male    DOB: [birth year], 48 y.o.   MRN: [id number]  HPI Logan [name] is a 48 y.o. male Presents today for: Chief Complaint  Patient presents with   Annual Exam    Patient is doing well no othe issus. Patient stated his blood sugar this am was 250 last supper was at 11:45 the night before. PAtient have an upcoming appt with Endo    Presents for annual exam.  History of psoriasis, hypertension, allergic rhinitis, diabetes.  Most recently seen July 16 for his diabetes, which has been uncontrolled with variable readings and ultimately referred to endocrinology.  Diabetes: With hyperglycemia See last visit in July.  Uncontrolled, thought in part due to diet, and variation in timing of meals.  Lantus was increased to 60 units last visit, continued NovoLog 5 units with meals, metformin 1000 mg twice daily.  He is on ACE inhibitor and statin. Referred back to nutritionist and endocrinology.  Appointment with both on September 4. Gabapentin 300 mg nightly provided last visit for possible neuropathy versus carpal tunnel syndrome of arm.  Fasting 250 this morning, ate spaghetti at 11pm.  Fasting: 190 yesterday, 161 the day before.  Lowest reading 161 since last visit.  Still on lantus 60 units, 5 units novolog with breakfast, lunch and dinner.  Working 14 hr days at times, diet adherence difficult.   Arm symptoms have worsened. Still feeling tingling L greater than R forearm. Fingers lock up at times, not now. Stiff in am. Some improvement with gabapentin, but only taking once per week.    Soreness in L ankles at times, sharp pain with certain step - temporary, then feels like may fall but goes away. No persistent weakness. No recent injury. No foot tingling. Sore with wearing heavy shoes for awhile.   Has episodic back pain into hip - has separate appointment to discuss this in a few weeks. No recent changes or acute changes.    Lab Results    Component Value Date   HGBA1C 10.3 (H) 02/23/2019   HGBA1C 11.1 (H) 10/06/2018   HGBA1C 11.6 (H) 07/14/2018   Lab Results  Component Value Date   MICROALBUR 1.7 07/17/2015   LDLCALC 87 02/23/2019   CREATININE 0.91 02/23/2019   Cancer screening: Colon: Pre-visit July 28 with [place] endoscopy for planned colonoscopy. Prostate: no FH, no prior testing. Agrees to testing.   Has dermatologist for psoriasis. Plans to schedule appt.   Immunizations: Immunization History  Administered Date(s) Administered   Influenza,inj,Quad PF,6+ Mos 06/21/2013, 07/20/2014, 07/17/2015, 06/23/2016, 06/07/2017, 04/25/2018   Pneumococcal Polysaccharide-23 11/20/2014   Tdap 11/20/2014  agrees to shingrix.   STI testing:  Monogamous with GF, but requests testing.    Depression screen [facility] 2/9 03/17/2019 02/23/2019 11/03/2018 10/06/2018 09/02/2018  Decreased Interest 0 0 0 0 0  Down, Depressed, Hopeless 0 0 0 0 0  PHQ - 2 Score 0 0 0 0 0    Hearing Screening   _0  _1  _2  _3  _4  _5  _6  _7  _8   Right ear:           Left ear:             Visual Acuity Screening   Right eye Left eye Both eyes  Without correction:     With correction: _9  appt with optho next month.   Dental: every 6 months.   Exercise: no regular exercise recently - past 2 months.  Patient Active Problem List   Diagnosis Date Noted   Acute sinusitis 07/06/2017   Asthmatic bronchitis 07/06/2017   Allergic rhinitis with a nonallergic component 12/08/2016   Psoriasis 12/08/2016   Diabetes ([place]) 05/31/2012   Hypertension 05/31/2012   Past Medical History:  Diagnosis Date   Diabetes mellitus    takes Janumet and Invokana daily   Hernia, umbilical    Hypertension    takes Lisinopril daily   Plaque psoriasis    Seasonal allergies    takes Claritin daily and uses Flonase daily   Past Surgical History:  Procedure Laterality Date   INSERTION OF MESH N/A 05/07/2016    Procedure: INSERTION OF MESH;  Surgeon: Erroll Luna, MD;  Location: [place];  Service: General;  Laterality: N/A;   TYMPANOSTOMY TUBE PLACEMENT     VENTRAL HERNIA REPAIR N/A 05/07/2016   Procedure: REPAIR VENTRAL HERNIA;  Surgeon: Erroll Luna, MD;  Location: [place];  Service: General;  Laterality: N/A;   Allergies  Allergen Reactions   Penicillins Hives and Rash    Has patient had a PCN reaction causing immediate rash, facial/tongue/throat swelling, SOB or lightheadedness with hypotension: Yes Has patient had a PCN reaction causing severe rash involving mucus membranes or skin necrosis: No Has patient had a PCN reaction that required hospitalization No Has patient had a PCN reaction occurring within the last 10 years: No If all of the above answers are "NO", then may proceed with Cephalosporin use.    Prior to Admission medications   Medication Sig Start Date End Date Taking? Authorizing Provider  aspirin EC 81 MG tablet Take 81 mg by mouth daily.   Yes [provider]  atorvastatin (LIPITOR) 20 MG tablet Take 1 tablet (20 mg total) by mouth daily at 6 PM. 10/06/18  Yes [name], MD  B Complex-C (B-COMPLEX WITH VITAMIN C) tablet Take 1 tablet by mouth daily.   Yes [provider]  BAYER MICROLET LANCETS lancets TEST UP TO 3 TIMES A DAY 01/11/18  Yes [name], MD  blood glucose meter kit and supplies Dispense based on patient and insurance preference. Use up to 3 times daily, uncontrolled diabetes.  Contour Next meter with test strips and lancets. 11/15/17  Yes [name], MD  CINNAMON PO Take by mouth.   Yes [provider]  CONTOUR NEXT TEST test strip TEST UP TO 3 TIMES A DAY 03/06/19  Yes [name], MD  fluticasone [facility]) 50 MCG/ACT nasal spray Place 1-2 sprays into both nostrils daily. 11/03/18  Yes [name], MD  gabapentin (NEURONTIN) 300 MG capsule Take 1 capsule (300 mg total) by mouth at bedtime. 02/23/19  Yes  [name], MD  insulin aspart (NOVOLOG FLEXPEN) 100 UNIT/ML FlexPen Inject 5 Units into the skin 3 (three) times daily with meals. 11/03/18  Yes [name], MD  Insulin Pen Needle (B-D UF III MINI PEN NEEDLES) 31G X 5 MM MISC USE AS DIRECTED DAILY 11/01/18  Yes [name], MD  LANTUS SOLOSTAR 100 UNIT/ML Solostar Pen INJECT 52 UNITS INTO THE SKIN DAILY. 03/07/19  Yes [name], MD  lisinopril (PRINIVIL,ZESTRIL) 10 MG tablet Take 1 tablet (10 mg total) by mouth daily. 10/06/18  Yes [name], MD  loratadine (CLARITIN) 10 MG tablet Take 10 mg by mouth daily.   Yes [provider]  metFORMIN (GLUCOPHAGE) 1000 MG tablet Take 1 tablet (1,000 mg total) by mouth 2 (two) times daily with  a meal. 10/06/18  Yes [name], MD  Multiple Vitamin (MULTIVITAMIN WITH MINERALS) TABS tablet Take 1 tablet by mouth daily.   Yes [provider]  Na Sulfate-K Sulfate-Mg Sulf 17.5-3.13-1.6 GM/177ML SOLN Suprep (no substitutions)-TAKE AS DIRECTED. 03/07/19  Yes [place], MD  TURMERIC PO Take by mouth.   Yes [provider]  vitamin B-12 (CYANOCOBALAMIN) 1000 MCG tablet Take 1,000 mcg by mouth daily.   Yes [provider]  vitamin C (ASCORBIC ACID) 500 MG tablet Take 500 mg by mouth daily.   Yes [provider]   Social History   Socioeconomic History   Marital status: Single    Spouse name: Not on file   Number of children: Not on file   Years of education: Not on file   Highest education level: Not on file  Occupational History   Not on file  Social Needs   Financial resource strain: Not on file   Food insecurity    Worry: Not on file    Inability: Not on file   Transportation needs    Medical: Not on file    Non-medical: Not on file  Tobacco Use   Smoking status: Never Smoker   Smokeless tobacco: Never Used  Substance and Sexual Activity   Alcohol use: Yes    Comment: occ glass of wine.2-3 per month    Drug use: No   Sexual activity: Never  Lifestyle   Physical activity    Days per week: Not on file    Minutes per session: Not on file   Stress: Not on file  Relationships   Social connections    Talks on phone: Not on file    Gets together: Not on file    Attends religious service: Not on file    Active member of club or organization: Not on file    Attends meetings of clubs or organizations: Not on file    Relationship status: Not on file   Intimate partner violence    Fear of current or ex partner: Not on file    Emotionally abused: Not on file    Physically abused: Not on file    Forced sexual activity: Not on file  Other Topics Concern   Not on file  Social History Narrative   Not on file    Review of Systems 13 point review of systems per patient health survey noted.  Negative other than as indicated above or in HPI.      Objective:   Physical Exam Vitals signs reviewed.  Constitutional:      Appearance: He is well-developed.  HENT:     Head: Normocephalic and atraumatic.     Right Ear: External ear normal.     Left Ear: External ear normal.  Eyes:     Conjunctiva/sclera: Conjunctivae normal.     Pupils: Pupils are equal, round, and reactive to light.  Neck:     Musculoskeletal: Normal range of motion and neck supple.     Thyroid: No thyromegaly.  Cardiovascular:     Rate and Rhythm: Normal rate and regular rhythm.     Heart sounds: Normal heart sounds.  Pulmonary:     Effort: Pulmonary effort is normal. No respiratory distress.     Breath sounds: Normal breath sounds. No wheezing.  Abdominal:     General: There is no distension.     Palpations: Abdomen is soft.     Tenderness: There is no abdominal tenderness.  Hernia: There is no hernia in the left inguinal area or right inguinal area.  Genitourinary:    Prostate: Normal.  Musculoskeletal: Normal range of motion.        General: No tenderness.     Right wrist: He exhibits normal range  of motion, no tenderness (negative tinel, phalen. ) and no bony tenderness.     Left wrist: He exhibits normal range of motion, no tenderness (neg tinel/phalen. ) and no bony tenderness.     Left ankle: He exhibits normal range of motion, no swelling, no ecchymosis and no deformity. No tenderness. No lateral malleolus and no medial malleolus tenderness found.       Feet:  Lymphadenopathy:     Cervical: No cervical adenopathy.  Skin:    General: Skin is warm and dry.  Neurological:     Mental Status: He is alert and oriented to person, place, and time.     Deep Tendon Reflexes: Reflexes are normal and symmetric.  Psychiatric:        Behavior: Behavior normal.    Vitals:   03/17/19 0838  BP: 117/77  Pulse: 94  Resp: 14  Temp: 98.6 F (37 C)  TempSrc: Oral  SpO2: 96%  Weight: 192 lb 9.6 oz (87.4 kg)   Dg Ankle Complete Left  Result Date: 03/17/2019 CLINICAL DATA:  Left ankle pain. EXAM: LEFT ANKLE COMPLETE - 3+ VIEW COMPARISON:  No recent prior. FINDINGS: No acute bony or joint abnormality. No evidence of fracture or dislocation. IMPRESSION: No acute abnormality. Electronically Signed   By: [name]  Register   On: 03/17/2019 09:49       Assessment & Plan:    SULTAN PARGAS is a 48 y.o. male Annual physical exam  - -anticipatory guidance as below in AVS, screening labs above. Health maintenance items as above in HPI discussed/recommended as applicable.   Screening for prostate cancer - Plan: PSA  - We discussed pros and cons of prostate cancer screening, and after this discussion, he chose to have screening done. PSA obtained, and no concerning findings on DRE.   Need for shingles vaccine - Plan: Zoster Vaccine Adjuvanted [facility]) injection sent to pharmacy  Left ankle pain, unspecified chronicity - Plan: DG Ankle Complete Left  -Possible chronic ankle instability with prior sprain.  Home exercise program given on handout, x-ray without concerns.  Follow-up planned for back  pain and can evaluate ankle further at that time.  Arthralgia of forearm, unspecified laterality - Plan: Ambulatory referral to Hand Surgery  -Although atypical location, dysesthesias from uncontrolled diabetes were discussed versus other neuronal impingement, compressive neuropathy such as carpal tunnel but negative Tinel and Phalen testing.  Refer to hand specialist to evaluate further, continue gabapentin as needed for now  Type 2 diabetes mellitus with hyperglycemia, with long-term current use of insulin ([place])  -Uncontrolled.  Dietary indiscretion, late meals likely significant factor.  Again stressed importance of improved control and potential risks of uncontrolled diabetes in both short and long-term.  Meal planning discussed and handout given for options to help with food choices with diabetes.  Has endocrinology appointment pending as well as nutritionist next month.  - increase Lantus to 62 units/day, continue mealtime coverage with NovoLog, metformin same dose, monitor home readings   Routine screening for STI (sexually transmitted infection) - Plan: GC/Chlamydia probe amp ([place])not at [facility], HIV Antibody (routine testing w rflx), RPR   Meds ordered this encounter  Medications   Zoster Vaccine Adjuvanted [facility]) injection  Sig: Inject 0.5 mLs into the muscle once for 1 dose. Repeat in 2-6 months.    Dispense:  0.5 mL    Refill:  1   Patient Instructions    Avoid late night eating, and see info below on diet with diabetes. Starches such as pasta will likely increase blood sugar significantly. Increase lantus to 62 units for now.    Low intensity exercise such as walking for now, with goal of 150 mins per week.   Exercises for ankle below, can recheck in few weeks. Will let you know if issues on xray.   Try gabpapentin nightly, but I will also refer you to hand/ortho for your forearm issues.   Keeping you healthy  Get these tests  Blood pressure- Have your blood  pressure checked once a year by your healthcare provider.  Normal blood pressure is 120/80  Weight- Have your body mass index (BMI) calculated to screen for obesity.  BMI is a measure of body fat based on height and weight. You can also calculate your own BMI at [employer].si.  Cholesterol- Have your cholesterol checked every year.  Diabetes- Have your blood sugar checked regularly if you have high blood pressure, high cholesterol, have a family history of diabetes or if you are overweight.  Screening for Colon Cancer- Colonoscopy starting at age 108.  Screening may begin sooner depending on your family history and other health conditions. Follow up colonoscopy as directed by your Gastroenterologist.  Screening for Prostate Cancer- Both blood work (PSA) and a rectal exam help screen for Prostate Cancer.  Screening begins at age 41 with African-American men and at age 69 with Caucasian men.  Screening may begin sooner depending on your family history.  Take these medicines  Aspirin- One aspirin daily can help prevent Heart disease and Stroke.  Flu shot- Every fall.  Tetanus- Every 10 years.  Zostavax- Once after the age of 71 to prevent Shingles.  Pneumonia shot- Once after the age of 63; if you are younger than 91, ask your healthcare provider if you need a Pneumonia shot.  Take these steps  Don't smoke- If you do smoke, talk to your doctor about quitting.  For tips on how to quit, go to www.smokefree.gov or call 1-800-QUIT-NOW.  Be physically active- Exercise 5 days a week for at least 30 minutes.  If you are not already physically active start slow and gradually work up to 30 minutes of moderate physical activity.  Examples of moderate activity include walking briskly, mowing the yard, dancing, swimming, bicycling, etc.  Eat a healthy diet- Eat a variety of healthy food such as fruits, vegetables, low fat milk, low fat cheese, yogurt, lean meant, poultry, fish, beans, tofu,  etc. For more information go to www.thenutritionsource.org  Drink alcohol in moderation- Limit alcohol intake to less than two drinks a day. Never drink and drive.  Dentist- Brush and floss twice daily; visit your dentist twice a year.  Depression- Your emotional health is as important as your physical health. If you're feeling down, or losing interest in things you would normally enjoy please talk to your healthcare provider.  Eye exam- Visit your eye doctor every year.  Safe sex- If you may be exposed to a sexually transmitted infection, use a condom.  Seat belts- Seat belts can save your life; always wear one.  Smoke/Carbon Monoxide detectors- These detectors need to be installed on the appropriate level of your home.  Replace batteries at least once a year.  Skin  cancer- When out in the sun, cover up and use sunscreen 15 SPF or higher.  Violence- If anyone is threatening you, please tell your healthcare provider.  Living Will/ Health care power of attorney- Speak with your healthcare provider and family.  Ankle Exercises Ask your health care provider which exercises are safe for you. Do exercises exactly as told by your health care provider and adjust them as directed. It is normal to feel mild stretching, pulling, tightness, or mild discomfort as you do these exercises. Stop right away if you feel sudden pain or your pain gets worse. Do not begin these exercises until told by your health care provider. Stretching and range-of-motion exercises These exercises warm up your muscles and joints and improve the movement and flexibility of your ankle. These exercises may also help to relieve pain. Dorsiflexion/plantar flexion  1. Sit with your __________ knee straight or bent. Do not rest your foot on anything. 2. Flex your __________ ankle to tilt the top of your foot toward your shin. This is called dorsiflexion. 3. Hold this position for __________ seconds. 4. Point your toes downward  to tilt the top of your foot away from your shin. This is called plantar flexion. 5. Hold this position for __________ seconds. Repeat __________ times. Complete this exercise __________ times a day. Ankle alphabet  1. Sit with your __________ foot supported at your lower leg. ? Do not rest your foot on anything. ? Make sure your foot has room to move freely. 2. Think of your __________ foot as a paintbrush: ? Move your foot to trace each letter of the alphabet in the air. Keep your hip and knee still while you trace the letters. Trace every letter from A to Z. ? Make the letters as large as you can without causing or increasing any discomfort. Repeat __________ times. Complete this exercise __________ times a day. Passive ankle dorsiflexion This is an exercise in which something or someone moves your ankle for you. You do not move it yourself. 1. Sit on a chair that is placed on a non-carpeted surface. 2. Place your __________ foot on the floor, directly under your __________ knee. Extend your __________ leg for support. 3. Keeping your heel down, slide your __________ foot back toward the chair until you feel a stretch at your ankle or calf. If you do not feel a stretch, slide your buttocks forward to the edge of the chair while keeping your heel down. 4. Hold this stretch for __________ seconds. Repeat __________ times. Complete this exercise __________ times a day. Strengthening exercises These exercises build strength and endurance in your ankle. Endurance is the ability to use your muscles for a long time, even after they get tired. Dorsiflexors These are muscles that lift your foot up. 1. Secure a rubber exercise band or tube to an object, such as a table leg, that will stay still when the band is pulled. Secure the other end around your __________ foot. 2. Sit on the floor, facing the object with your __________ leg extended. The band or tube should be slightly tense when your foot is  relaxed. 3. Slowly flex your __________ ankle and toes to bring your foot toward your shin. 4. Hold this position for __________ seconds. 5. Slowly return your foot to the starting position, controlling the band as you do that. Repeat __________ times. Complete this exercise __________ times a day. Plantar flexors These are muscles that push your foot down. 1. Sit on the floor  with your __________ leg extended. 2. Loop a rubber exercise band or tube around the ball of your __________ foot. The ball of your foot is on the walking surface, right under your toes. The band or tube should be slightly tense when your foot is relaxed. 3. Slowly point your toes downward, pushing them away from you. 4. Hold this position for __________ seconds. 5. Slowly release the tension in the band or tube, controlling smoothly until your foot is back in the starting position. Repeat __________ times. Complete this exercise __________ times a day. Towel curls  1. Sit in a chair on a non-carpeted surface, and put your feet on the floor. 2. Place a towel in front of your feet. If told by your health care provider, add a __________ pound weight to the end of the towel. 3. Keeping your heel on the floor, put your __________ foot on the towel. 4. Pull the towel toward you by grabbing the towel with your toes and curling them under. Keep your heel on the floor. 5. Let your toes relax. 6. Grab the towel again. Keep pulling the towel until it is completely underneath your foot. Repeat __________ times. Complete this exercise __________ times a day. Standing plantar flexion This is an exercise in which you use your toes to lift your body's weight while standing. 1. Stand with your feet shoulder-width apart. 2. Keep your weight spread evenly over the width of your feet while you rise up on your toes. Use a wall or table to steady yourself if needed, but try not to use it for support. 3. If this exercise is too easy, try  these options: ? Shift your weight toward your __________ leg until you feel challenged. ? If told by your health care provider, lift your uninjured leg off the floor. 4. Hold this position for __________ seconds. Repeat __________ times. Complete this exercise __________ times a day. Tandem walking 1. Stand with one foot directly in front of the other. 2. Slowly raise your back foot up, lifting your heel before your toes, and place it directly in front of your other foot. 3. Continue to walk in this heel-to-toe way for __________ or for as long as told by your health care provider. Have a countertop or wall nearby to use if needed to keep your balance, but try not to hold onto anything for support. Repeat __________ times. Complete this exercise __________ times a day. This information is not intended to replace advice given to you by your health care provider. Make sure you discuss any questions you have with your health care provider. Document Released: 06/10/2005 Document Revised: 04/23/2018 Document Reviewed: 04/25/2018 Elsevier Patient Education  [place].   Diabetes Mellitus and Nutrition, Adult When you have diabetes (diabetes mellitus), it is very important to have healthy eating habits because your blood sugar (glucose) levels are greatly affected by what you eat and drink. Eating healthy foods in the appropriate amounts, at about the same times every day, can help you:  Control your blood glucose.  Lower your risk of heart disease.  Improve your blood pressure.  Reach or maintain a healthy weight. Every person with diabetes is different, and each person has different needs for a meal plan. Your health care provider may recommend that you work with a diet and nutrition specialist (dietitian) to make a meal plan that is best for you. Your meal plan may vary depending on factors such as:  The calories you need.  The medicines you take.  Your weight.  Your blood  glucose, blood pressure, and cholesterol levels.  Your activity level.  Other health conditions you have, such as heart or kidney disease. How do carbohydrates affect me? Carbohydrates, also called carbs, affect your blood glucose level more than any other type of food. Eating carbs naturally raises the amount of glucose in your blood. Carb counting is a method for keeping track of how many carbs you eat. Counting carbs is important to keep your blood glucose at a healthy level, especially if you use insulin or take certain oral diabetes medicines. It is important to know how many carbs you can safely have in each meal. This is different for every person. Your dietitian can help you calculate how many carbs you should have at each meal and for each snack. Foods that contain carbs include:  Bread, cereal, rice, pasta, and crackers.  Potatoes and corn.  Peas, beans, and lentils.  Milk and yogurt.  Fruit and juice.  Desserts, such as cakes, cookies, ice cream, and candy. How does alcohol affect me? Alcohol can cause a sudden decrease in blood glucose (hypoglycemia), especially if you use insulin or take certain oral diabetes medicines. Hypoglycemia can be a life-threatening condition. Symptoms of hypoglycemia (sleepiness, dizziness, and confusion) are similar to symptoms of having too much alcohol. If your health care provider says that alcohol is safe for you, follow these guidelines:  Limit alcohol intake to no more than 1 drink per day for nonpregnant women and 2 drinks per day for men. One drink equals 12 oz of beer, 5 oz of wine, or 1 oz of hard liquor.  Do not drink on an empty stomach.  Keep yourself hydrated with water, diet soda, or unsweetened iced tea.  Keep in mind that regular soda, juice, and other mixers may contain a lot of sugar and must be counted as carbs. What are tips for following this plan?  Reading food labels  Start by checking the serving size on the  "Nutrition Facts" label of packaged foods and drinks. The amount of calories, carbs, fats, and other nutrients listed on the label is based on one serving of the item. Many items contain more than one serving per package.  Check the total grams (g) of carbs in one serving. You can calculate the number of servings of carbs in one serving by dividing the total carbs by 15. For example, if a food has 30 g of total carbs, it would be equal to 2 servings of carbs.  Check the number of grams (g) of saturated and trans fats in one serving. Choose foods that have low or no amount of these fats.  Check the number of milligrams (mg) of salt (sodium) in one serving. Most people should limit total sodium intake to less than 2,300 mg per day.  Always check the nutrition information of foods labeled as "low-fat" or "nonfat". These foods may be higher in added sugar or refined carbs and should be avoided.  Talk to your dietitian to identify your daily goals for nutrients listed on the label. Shopping  Avoid buying canned, premade, or processed foods. These foods tend to be high in fat, sodium, and added sugar.  Shop around the outside edge of the grocery store. This includes fresh fruits and vegetables, bulk grains, fresh meats, and fresh dairy. Cooking  Use low-heat cooking methods, such as baking, instead of high-heat cooking methods like deep frying.  Cook using healthy oils, such  as olive, canola, or sunflower oil.  Avoid cooking with butter, cream, or high-fat meats. Meal planning  Eat meals and snacks regularly, preferably at the same times every day. Avoid going long periods of time without eating.  Eat foods high in fiber, such as fresh fruits, vegetables, beans, and whole grains. Talk to your dietitian about how many servings of carbs you can eat at each meal.  Eat 4-6 ounces (oz) of lean protein each day, such as lean meat, chicken, fish, eggs, or tofu. One oz of lean protein is equal  to: ? 1 oz of meat, chicken, or fish. ? 1 egg. ?  cup of tofu.  Eat some foods each day that contain healthy fats, such as avocado, nuts, seeds, and fish. Lifestyle  Check your blood glucose regularly.  Exercise regularly as told by your health care provider. This may include: ? 150 minutes of moderate-intensity or vigorous-intensity exercise each week. This could be brisk walking, biking, or water aerobics. ? Stretching and doing strength exercises, such as yoga or weightlifting, at least 2 times a week.  Take medicines as told by your health care provider.  Do not use any products that contain nicotine or tobacco, such as cigarettes and e-cigarettes. If you need help quitting, ask your health care provider.  Work with a Social worker or diabetes educator to identify strategies to manage stress and any emotional and social challenges. Questions to ask a health care provider  Do I need to meet with a diabetes educator?  Do I need to meet with a dietitian?  What number can I call if I have questions?  When are the best times to check my blood glucose? Where to find more information:  American Diabetes Association: diabetes.org  Academy of Nutrition and Dietetics: www.eatright.[employer] of Diabetes and Digestive and Kidney Diseases (NIH): [employer] Summary  A healthy meal plan will help you control your blood glucose and maintain a healthy lifestyle.  Working with a diet and nutrition specialist (dietitian) can help you make a meal plan that is best for you.  Keep in mind that carbohydrates (carbs) and alcohol have immediate effects on your blood glucose levels. It is important to count carbs and to use alcohol carefully. This information is not intended to replace advice given to you by your health care provider. Make sure you discuss any questions you have with your health care provider. Document Released: 04/23/2005 Document Revised: 07/09/2017 Document  Reviewed: 08/31/2016 Elsevier Patient Education  [employer].   If you have lab work done today you will be contacted with your lab results within the next 2 weeks.  If you have not heard from [place] then please contact us. The fastest way to get your results is to register for My Chart.   IF you received an x-ray today, you will receive an invoice from [facility]. Please contact [facility] at [phone] with questions or concerns regarding your invoice.   IF you received labwork today, you will receive an invoice from [place]. Please contact [facility] at [phone] with questions or concerns regarding your invoice.   Our billing staff will not be able to assist you with questions regarding bills from these companies.  You will be contacted with the lab results as soon as they are available. The fastest way to get your results is to activate your My Chart account. Instructions are located on the last page of this paperwork. If you have not heard from [place] regarding the  results in 2 weeks, please contact this office.       Signed,   [name], MD [facility].  03/18/19 11:24 AM

## 2019-03-18 LAB — RPR: RPR Ser Ql: NONREACTIVE

## 2019-03-18 LAB — PSA: Prostate Specific Ag, Serum: 0.4 ng/mL (ref 0.0–4.0)

## 2019-03-18 LAB — GC/CHLAMYDIA PROBE AMP (~~LOC~~) NOT AT ARMC
Chlamydia: NEGATIVE
Neisseria Gonorrhea: NEGATIVE

## 2019-03-18 LAB — HIV ANTIBODY (ROUTINE TESTING W REFLEX): HIV Screen 4th Generation wRfx: NONREACTIVE

## 2019-03-19 ENCOUNTER — Encounter: Payer: Self-pay | Admitting: Family Medicine

## 2019-03-21 ENCOUNTER — Telehealth: Payer: Self-pay | Admitting: Gastroenterology

## 2019-03-21 NOTE — Telephone Encounter (Signed)
LM on vmail to call back regarding Covid-19 screening questions °Covid-19 Screening Questions ° °Do you now or have you had a fever in the last 14 days?      ° °Do you have any respiratory symptoms of shortness of breath or cough now or in the last 14 days?     ° °Do you have any family members or close contacts with diagnosed or suspected Covid-19 in the past 14 days?      ° °Have you been tested for Covid-19 and found to be positive?     ° °  ° °  °

## 2019-03-21 NOTE — Telephone Encounter (Signed)
Pt returned call and answered "NO" to all of the Covid-19 screening questions

## 2019-03-22 ENCOUNTER — Encounter: Payer: Self-pay | Admitting: *Deleted

## 2019-03-22 ENCOUNTER — Encounter: Payer: Self-pay | Admitting: Gastroenterology

## 2019-03-22 ENCOUNTER — Ambulatory Visit (AMBULATORY_SURGERY_CENTER): Payer: BC Managed Care – PPO | Admitting: Gastroenterology

## 2019-03-22 ENCOUNTER — Other Ambulatory Visit: Payer: Self-pay

## 2019-03-22 VITALS — BP 110/68 | HR 83 | Temp 97.8°F | Resp 15 | Ht 64.0 in | Wt 192.0 lb

## 2019-03-22 DIAGNOSIS — Z1211 Encounter for screening for malignant neoplasm of colon: Secondary | ICD-10-CM | POA: Diagnosis not present

## 2019-03-22 DIAGNOSIS — D123 Benign neoplasm of transverse colon: Secondary | ICD-10-CM

## 2019-03-22 DIAGNOSIS — Z538 Procedure and treatment not carried out for other reasons: Secondary | ICD-10-CM

## 2019-03-22 DIAGNOSIS — K635 Polyp of colon: Secondary | ICD-10-CM

## 2019-03-22 MED ORDER — SODIUM CHLORIDE 0.9 % IV SOLN: 500.0000 mL | Freq: Once | INTRAVENOUS | Status: DC

## 2019-03-22 NOTE — Op Note (Addendum)
[facility] Patient Name: Logan [name] Procedure Date: 03/22/2019 8:38 AM MRN: [id number] Endoscopist: Thornton Park MD, MD Age: 48 Referring MD:  Date of Birth: [birth year] Gender: Male Account #: [id number] Procedure:                Colonoscopy Indications:              Colon cancer screening in patient at increased                            risk: Family history of 1st-degree relative with                            colon polyps before age 61 years, Colon cancer                            screening in patient at increased risk: Family                            history of colon polyps in multiple 1st-degree                            relatives, This is the patient's first colonoscopy.                            Mother and Brother with colon polyps. Medicines:                See the Anesthesia note for documentation of the                            administered medications Procedure:                Pre-Anesthesia Assessment:                           - Prior to the procedure, a History and Physical                            was performed, and patient medications and                            allergies were reviewed. The patient's tolerance of                            previous anesthesia was also reviewed. The risks                            and benefits of the procedure and the sedation                            options and risks were discussed with the patient.                            All questions were answered, and informed consent  was obtained. Prior Anticoagulants: The patient has                            taken no previous anticoagulant or antiplatelet                            agents. ASA Grade Assessment: II - A patient with                            mild systemic disease. After reviewing the risks                            and benefits, the patient was deemed in                            satisfactory condition to  undergo the procedure.                           After obtaining informed consent, the colonoscope                            was passed under direct vision. Throughout the                            procedure, the patient's blood pressure, pulse, and                            oxygen saturations were monitored continuously. The                            Colonoscope was introduced through the anus and                            advanced to the the terminal ileum, with                            identification of the appendiceal orifice and IC                            valve. The colonoscopy was performed with                            difficulty due to inadequate bowel prep. Successful                            completion of the procedure was aided by lavage.                            The patient tolerated the procedure well. The                            quality of the bowel preparation was inadequate.  The terminal ileum, ileocecal valve, appendiceal                            orifice, and rectum were photographed. Scope In: 8:58:58 AM Scope Out: 9:09:59 AM Scope Withdrawal Time: 0 hours 5 minutes 30 seconds  Total Procedure Duration: 0 hours 11 minutes 1 second  Findings:                 The perianal and digital rectal examinations were                            normal.                           Extensive amounts of liquid semi-liquid semi-solid                            stool was found in the entire colon, precluding                            visualization. Lavage of the area was performed                            using greater than 500 mL, resulting in incomplete                            clearance with fair visualization. Complications:            No immediate complications. Estimated Blood Loss:     Estimated blood loss: none. Impression:               - Preparation of the colon was inadequate.                           - Stool in the entire  examined colon.                           - No specimens collected. Recommendation:           - Patient has a contact number available for                            emergencies. The signs and symptoms of potential                            delayed complications were discussed with the                            patient. Return to normal activities tomorrow.                            Written discharge instructions were provided to the                            patient.                           -  Continue purgative prep this morning, then NPO.                           - Return this afternoon for attempted colonoscopy. Thornton Park MD, MD 03/22/2019 9:20:53 AM This report has been signed electronically.

## 2019-03-22 NOTE — Progress Notes (Signed)
PT taken to PACU. Monitors in place. VSS. Report given to RN. 

## 2019-03-22 NOTE — Patient Instructions (Addendum)
Please read handouts provided. Continue present medications. High Fiber diet. Await pathology results.      YOU HAD AN ENDOSCOPIC PROCEDURE TODAY AT [facility]:   Refer to the procedure report that was given to you for any specific questions about what was found during the examination.  If the procedure report does not answer your questions, please call your gastroenterologist to clarify.  If you requested that your care partner not be given the details of your procedure findings, then the procedure report has been included in a sealed envelope for you to review at your convenience later.  YOU SHOULD EXPECT: Some feelings of bloating in the abdomen. Passage of more gas than usual.  Walking can help get rid of the air that was put into your GI tract during the procedure and reduce the bloating. If you had a lower endoscopy (such as a colonoscopy or flexible sigmoidoscopy) you may notice spotting of blood in your stool or on the toilet paper. If you underwent a bowel prep for your procedure, you may not have a normal bowel movement for a few days.  Please Note:  You might notice some irritation and congestion in your nose or some drainage.  This is from the oxygen used during your procedure.  There is no need for concern and it should clear up in a day or so.  SYMPTOMS TO REPORT IMMEDIATELY:   Following lower endoscopy (colonoscopy or flexible sigmoidoscopy):  Excessive amounts of blood in the stool  Significant tenderness or worsening of abdominal pains  Swelling of the abdomen that is new, acute  Fever of 100F or higher    For urgent or emergent issues, a gastroenterologist can be reached at any hour by calling [phone].   DIET:  We do recommend a small meal at first, but then you may proceed to your regular diet.  Drink plenty of fluids but you should avoid alcoholic beverages for 24 hours.  ACTIVITY:  You should plan to take it easy for the rest of today and  you should NOT DRIVE or use heavy machinery until tomorrow (because of the sedation medicines used during the test).    FOLLOW UP: Our staff will call the number listed on your records 48-72 hours following your procedure to check on you and address any questions or concerns that you may have regarding the information given to you following your procedure. If we do not reach you, we will leave a message.  We will attempt to reach you two times.  During this call, we will ask if you have developed any symptoms of COVID 19. If you develop any symptoms (ie: fever, flu-like symptoms, shortness of breath, cough etc.) before then, please call [phone].  If you test positive for Covid 19 in the 2 weeks post procedure, please call and report this information to [place].    If any biopsies were taken you will be contacted by phone or by letter within the next 1-3 weeks.  Please call us at [phone] if you have not heard about the biopsies in 3 weeks.    SIGNATURES/CONFIDENTIALITY: You and/or your care partner have signed paperwork which will be entered into your electronic medical record.  These signatures attest to the fact that that the information above on your After Visit Summary has been reviewed and is understood.  Full responsibility of the confidentiality of this discharge information lies with you and/or your care-partner.

## 2019-03-22 NOTE — Progress Notes (Signed)
Called to room to assist during endoscopic procedure.  Patient ID and intended procedure confirmed with present staff. Received instructions for my participation in the procedure from the performing physician.  

## 2019-03-22 NOTE — Op Note (Signed)
[place] Patient Name: Logan [name] Procedure Date: 03/22/2019 2:14 PM MRN: [id number] Endoscopist: Thornton Park MD, MD Age: 48 Referring MD:  Date of Birth: June 10, 1971 Gender: Male Account #: [id number] Procedure:                Colonoscopy Indications:              Screening for colorectal malignant neoplasm, This                            is the patient's first colonoscopy                           Attempted colonoscopy this morning but polyps could                            not be excluded given the poor prep. Reprepped                            today.                           Mother and brother with colon polyps. Medicines:                See the Anesthesia note for documentation of the                            administered medications Procedure:                Pre-Anesthesia Assessment:                           - Prior to the procedure, a History and Physical                            was performed, and patient medications and                            allergies were reviewed. The patient's tolerance of                            previous anesthesia was also reviewed. The risks                            and benefits of the procedure and the sedation                            options and risks were discussed with the patient.                            All questions were answered, and informed consent                            was obtained. Prior Anticoagulants: The patient has  taken no previous anticoagulant or antiplatelet                            agents. ASA Grade Assessment: II - A patient with                            mild systemic disease. After reviewing the risks                            and benefits, the patient was deemed in                            satisfactory condition to undergo the procedure.                           - Prior to the procedure, a History and Physical                            was  performed, and patient medications and                            allergies were reviewed. The patient's tolerance of                            previous anesthesia was also reviewed. The risks                            and benefits of the procedure and the sedation                            options and risks were discussed with the patient.                            All questions were answered, and informed consent                            was obtained. Prior Anticoagulants: The patient has                            taken no previous anticoagulant or antiplatelet                            agents. ASA Grade Assessment: II - A patient with                            mild systemic disease. After reviewing the risks                            and benefits, the patient was deemed in                            satisfactory condition to undergo the procedure.  After obtaining informed consent, the colonoscope                            was passed under direct vision. Throughout the                            procedure, the patient's blood pressure, pulse, and                            oxygen saturations were monitored continuously. The                            Colonoscope was introduced through the anus and                            advanced to the the terminal ileum, with                            identification of the appendiceal orifice and IC                            valve. A second forward view of the right colon was                            performed. The colonoscopy was performed without                            difficulty. The patient tolerated the procedure                            well. The quality of the bowel preparation was                            adequate. The terminal ileum, ileocecal valve,                            appendiceal orifice, and rectum were photographed. Scope In: 2:12:01 PM Scope Out: 2:35:26 PM Scope Withdrawal Time:  0 hours 16 minutes 11 seconds  Total Procedure Duration: 0 hours 23 minutes 25 seconds  Findings:                 The perianal and digital rectal examinations were                            normal.                           Multiple small and large-mouthed diverticula were                            found in the sigmoid colon and descending colon.                           A 2 mm polyp was found in the splenic flexure. The  polyp was sessile. The polyp was removed with a                            cold snare. Resection and retrieval were complete.                            Estimated blood loss was minimal.                           A 2 mm polyp was found in the transverse colon. The                            polyp was sessile. The polyp was removed with a                            cold snare. Resection and retrieval were complete.                            Estimated blood loss was minimal.                           Non-bleeding internal hemorrhoids were found. The                            hemorrhoids were small.                           The exam was otherwise without abnormality on                            direct and retroflexion views. Complications:            No immediate complications. Estimated blood loss:                            Minimal. Estimated Blood Loss:     Estimated blood loss was minimal. Impression:               - Diverticulosis in the sigmoid colon and in the                            descending colon.                           - One 2 mm polyp at the splenic flexure, removed                            with a cold snare. Resected and retrieved.                           - One 2 mm polyp in the transverse colon, removed                            with a cold snare. Resected and retrieved.                           -  Non-bleeding internal hemorrhoids.                           - The examination was otherwise normal on direct                             and retroflexion views. Recommendation:           - Patient has a contact number available for                            emergencies. The signs and symptoms of potential                            delayed complications were discussed with the                            patient. Return to normal activities tomorrow.                            Written discharge instructions were provided to the                            patient.                           - High fiber diet today.                           - Continue present medications.                           - Await pathology results.                           - Repeat colonoscopy in 5 years for surveillance                            given the family history. Plan a two day prep at                            that time. Thornton Park MD, MD 03/22/2019 2:48:58 PM This report has been signed electronically.

## 2019-03-22 NOTE — Progress Notes (Signed)
Pt's states no medical or surgical changes since previsit or office visit.  JB temps and CW vitals. 

## 2019-03-22 NOTE — Progress Notes (Signed)
Suprep sample given to patient (lot [id number],exp. 3/22. 1 kit take as directed. New Suprep instructions went over with patient per Dr.Beavers request. Pt verbalizes understanding.  

## 2019-03-24 ENCOUNTER — Telehealth: Payer: Self-pay | Admitting: *Deleted

## 2019-03-24 NOTE — Telephone Encounter (Signed)
Left message on f/u call 

## 2019-03-24 NOTE — Telephone Encounter (Signed)
  Follow up Call-  Call back number 03/22/2019  Post procedure Call Back phone  # [phone]  Permission to leave phone message Yes  Some recent data might be hidden     Patient questions: 1. Have you developed a fever since your procedure? no  2.   Have you had an respiratory symptoms (SOB or cough) since your procedure? no  3.   Have you tested positive for COVID 19 since your procedure no  4.   Have you had any family members/close contacts diagnosed with the COVID 19 since your procedure?  no   If yes to any of these questions please route to Joylene John, RN and Alphonsa Gin, Therapist, sports.  Do you have a fever, pain , or abdominal swelling? No. Pain Score  0 *  Have you tolerated food without any problems? Yes.    Have you been able to return to your normal activities? Yes.    Do you have any questions about your discharge instructions: Diet   No. Medications  No. Follow up visit  No.  Do you have questions or concerns about your Care? No.  Actions: * If pain score is 4 or above: No action needed, pain <4.

## 2019-03-29 ENCOUNTER — Encounter: Payer: Self-pay | Admitting: Gastroenterology

## 2019-03-30 ENCOUNTER — Encounter: Payer: Self-pay | Admitting: Orthopaedic Surgery

## 2019-03-30 ENCOUNTER — Ambulatory Visit: Payer: Self-pay

## 2019-03-30 ENCOUNTER — Ambulatory Visit (INDEPENDENT_AMBULATORY_CARE_PROVIDER_SITE_OTHER): Payer: BC Managed Care – PPO | Admitting: Orthopaedic Surgery

## 2019-03-30 ENCOUNTER — Other Ambulatory Visit: Payer: Self-pay

## 2019-03-30 DIAGNOSIS — M79601 Pain in right arm: Secondary | ICD-10-CM | POA: Diagnosis not present

## 2019-03-30 DIAGNOSIS — M5416 Radiculopathy, lumbar region: Secondary | ICD-10-CM | POA: Diagnosis not present

## 2019-03-30 DIAGNOSIS — M79602 Pain in left arm: Secondary | ICD-10-CM

## 2019-03-30 DIAGNOSIS — M542 Cervicalgia: Secondary | ICD-10-CM

## 2019-03-30 NOTE — Progress Notes (Signed)
Office Visit Note   Patient: Logan [name]           Date of Birth: 03-20-1971           MRN: [id number] Visit Date: 03/30/2019              Requested by: [name], MD [address] [place],  [place] [address] PCP: [name], MD   Assessment & Plan: Visit Diagnoses:  1. Radiculopathy, lumbar region   2. Bilateral arm pain     Plan: Impression is questionable cervical spine radiculopathy versus early neuropathy versus cubital/carpal tunnel syndrome.  #2 lumbar go.  In regards to the bilateral upper extremity, we will obtain a nerve conduction study/EMG to further assess this.  He will follow-up with [place] once that has been completed.  In regards to his back, he is really not very symptomatic at this point so we will continue to watch.  Call with concerns or questions in the meantime  Follow-Up Instructions: Return for after NCS/EMG.   Orders:  Orders Placed This Encounter  Procedures   XR Cervical Spine 2 or 3 views   XR Lumbar Spine 2-3 Views   Ambulatory referral to Physical Medicine Rehab   No orders of the defined types were placed in this encounter.     Procedures: No procedures performed   Clinical Data: No additional findings.   Subjective: Chief Complaint  Patient presents with   Arm Pain   Back Pain    HPI patient is a pleasant 48 year old gentleman who presents our clinic today with multiple issues.  The first being his left arm and hand as well as his right hand.  He has noticed tingling to his forearm that radiates into the palm and all 5 fingers to the left upper extremity as well as numbness and tingling to the right hand and all 5 fingers.  This has been ongoing for the past 5 years and has recently worsened.  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  The tingling that he has is fairly constant.  He has been wearing carpal tunnel splints at night that are of minimal help.  He denies any pain to the neck.  The other  issue he brings up is his right lower back.  He has had pain to the right lower back and lateral hip for the past few months.  Recently this pain has improved.  There is never an injury or change in activity.  The pain appears to worsen after he has been standing for long period of time.  He denies any numbness, tingling or burning.  Of note, he is an uncontrolled diabetic.  He notes that his doctor thinks he may have early neuropathy.  Review of Systems as detailed in HPI.  All others reviewed and are negative.   Objective: Vital Signs: There were no vitals taken for this visit.  Physical Exam well-developed and well-nourished gentleman in no acute distress.  Alert and oriented x3.  Ortho Exam examination of his left forearm reveals decreased sensation to the volar aspect.  He has a positive Tinel at the elbow and wrist.  Negative Tinel at the elbow and wrist on the right.  Negative Phalen both sides.  Full grip strength.  No thenar atrophy.  Full strength with biceps and triceps testing.  Specialty Comments:  No specialty comments available.  Imaging: Xr Cervical Spine 2 Or 3 Views  Result Date: 03/30/2019 X-rays  demonstrate multilevel degenerative disc disease worse at C6-7 with anterior calcification/ossification  Xr Lumbar Spine 2-3 Views  Result Date: 03/30/2019 X-rays demonstrate anterolisthesis at L5.  Disc base narrowing L5-S1.    PMFS History: Patient Active Problem List   Diagnosis Date Noted   Acute sinusitis 07/06/2017   Asthmatic bronchitis 07/06/2017   Allergic rhinitis with a nonallergic component 12/08/2016   Psoriasis 12/08/2016   Diabetes ([place]) 05/31/2012   Hypertension 05/31/2012   Past Medical History:  Diagnosis Date   Diabetes mellitus    takes Janumet and Invokana daily   Hernia, umbilical    Hypertension    takes Lisinopril daily   Plaque psoriasis    Seasonal allergies    takes Claritin daily and uses Flonase daily    Family History    Problem Relation Age of Onset   Hypertension Mother    Diverticulosis Mother    Osteoporosis Mother    Colon polyps Mother    Diabetes Father    Alcohol abuse Father    Osteoporosis Sister    Hypertension Sister    Diabetes Brother    Hypertension Brother    Colon polyps Brother    Hypertension Maternal Grandmother    Heart disease Maternal Grandfather    Diabetes Paternal Grandfather    Diabetes Brother    Hypertension Brother    Heart attack Brother    Osteoporosis Sister    Colon cancer Neg Hx    Esophageal cancer Neg Hx    Stomach cancer Neg Hx    Rectal cancer Neg Hx     Past Surgical History:  Procedure Laterality Date   INSERTION OF MESH N/A 05/07/2016   Procedure: INSERTION OF MESH;  Surgeon: Erroll Luna, MD;  Location: [facility];  Service: General;  Laterality: N/A;   TYMPANOSTOMY TUBE PLACEMENT     VENTRAL HERNIA REPAIR N/A 05/07/2016   Procedure: REPAIR VENTRAL HERNIA;  Surgeon: Erroll Luna, MD;  Location[facility];  Service: General;  Laterality: N/A;   Social History   Occupational History   Not on file  Tobacco Use   Smoking status: Never Smoker   Smokeless tobacco: Never Used  Substance and Sexual Activity   Alcohol use: Yes    Comment: occ glass of wine.2-3 per month   Drug use: No   Sexual activity: Never

## 2019-03-31 ENCOUNTER — Ambulatory Visit: Payer: BC Managed Care – PPO | Admitting: Family Medicine

## 2019-04-03 ENCOUNTER — Other Ambulatory Visit: Payer: Self-pay

## 2019-04-05 ENCOUNTER — Ambulatory Visit (INDEPENDENT_AMBULATORY_CARE_PROVIDER_SITE_OTHER): Payer: BC Managed Care – PPO | Admitting: Internal Medicine

## 2019-04-05 ENCOUNTER — Other Ambulatory Visit: Payer: Self-pay

## 2019-04-05 ENCOUNTER — Encounter: Payer: Self-pay | Admitting: Internal Medicine

## 2019-04-05 VITALS — BP 142/84 | HR 89 | Temp 98.2°F | Ht 64.0 in | Wt 187.0 lb

## 2019-04-05 DIAGNOSIS — E785 Hyperlipidemia, unspecified: Secondary | ICD-10-CM | POA: Diagnosis not present

## 2019-04-05 DIAGNOSIS — I10 Essential (primary) hypertension: Secondary | ICD-10-CM

## 2019-04-05 DIAGNOSIS — E1165 Type 2 diabetes mellitus with hyperglycemia: Secondary | ICD-10-CM

## 2019-04-05 DIAGNOSIS — Z794 Long term (current) use of insulin: Secondary | ICD-10-CM | POA: Diagnosis not present

## 2019-04-05 LAB — POCT GLYCOSYLATED HEMOGLOBIN (HGB A1C): Hemoglobin A1C: 10.6 % — AB (ref 4.0–5.6)

## 2019-04-05 MED ORDER — INSULIN PEN NEEDLE 32G X 4 MM MISC: 11 refills | Status: DC

## 2019-04-05 MED ORDER — NOVOLOG FLEXPEN 100 UNIT/ML ~~LOC~~ SOPN: 12.0000 [IU] | PEN_INJECTOR | Freq: Three times a day (TID) | SUBCUTANEOUS | 11 refills | Status: DC

## 2019-04-05 MED ORDER — LANTUS SOLOSTAR 100 UNIT/ML ~~LOC~~ SOPN: 50.0000 [IU] | PEN_INJECTOR | Freq: Every day | SUBCUTANEOUS | 3 refills | Status: DC

## 2019-04-05 NOTE — Patient Instructions (Signed)
-   Decrease Lantus to 50 units  - Increase Novolog to 12 units with Each Meal  - If your pre-meal glucose if over 200 , add 2 more units of Novolog to that meal  - Continue Metformin 1000 mg Twice daily   - Contact us if your sugars are consistently below 100 mg/dL    - Check sugar before each meal and bedtime     Choose healthy, lower carb lower calorie snacks: toss salad, vegetables, cottage cheese, peanut butter, low fat cheese / string cheese, lower sodium deli meat, tuna salad or chicken salad     HOW TO TREAT LOW BLOOD SUGARS (Blood sugar LESS THAN 70 MG/DL)  Please follow the RULE OF 15 for the treatment of hypoglycemia treatment (when your (blood sugars are less than 70 mg/dL)    STEP 1: Take 15 grams of carbohydrates when your blood sugar is low, which includes:   3-4 GLUCOSE TABS  OR  3-4 OZ OF JUICE OR REGULAR SODA OR  ONE TUBE OF GLUCOSE GEL     STEP 2: RECHECK blood sugar in 15 MINUTES STEP 3: If your blood sugar is still low at the 15 minute recheck --> then, go back to STEP 1 and treat AGAIN with another 15 grams of carbohydrates.

## 2019-04-05 NOTE — Progress Notes (Signed)
Name: Logan [name]  MRN/ DOB: [id number], [birth year]   Age/ Sex: 48 y.o., male    PCP: [name], MD   Reason for Endocrinology Evaluation: Type 2 Diabetes Mellitus     Date of Initial Endocrinology Visit: 04/05/2019     PATIENT IDENTIFIER: Logan [name] is a 48 y.o. male with a past medical history of HTN,T2DM and Dyslipidemia . The patient presented for initial endocrinology clinic visit on 04/05/2019 for consultative assistance with his diabetes management.    HPI: Logan [name] was    Diagnosed with DM for ~ 10 yrs  Prior Medications tried/Intolerance: Insulin started years later  Currently checking blood sugars 1 x / day,  before breakfast  Hypoglycemia episodes : no               Hemoglobin A1c has ranged from 7.4%in 2016, peaking at 12.1% in 2019. Patient required assistance for hypoglycemia: no Patient has required hospitalization within the last 1 year from hyper or hypoglycemia: no  In terms of diet, the patient eats 2-3  Meals, snacks 2x a day,  Avoids sugar- sweetened beverages.   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  HOME DIABETES REGIMEN: Lantus 62 units  Novolog 5 units TID QAC Metformin 1000 mg BID   Statin: yes ACE-I/ARB: yes Prior Diabetic Education: no    METER DOWNLOAD SUMMARY: 8/13-8/26/20 Fingerstick Blood Glucose Tests =26 Average Number Tests/Day = 1.9 Overall Mean FS Glucose =225   BG Ranges: Low = 106 High = 394  Hypoglycemic Events/30 Days: BG < 50 = 0 Episodes of symptomatic severe hypoglycemia = 0   Yesterday 305 mg/dL  Today 130 mg/dL      DIABETIC COMPLICATIONS: Microvascular complications:   Left retinopathy , neuropathy   Denies: CKD  Last eye exam: Completed 03/2019  Macrovascular complications:    Denies: CAD, PVD, CVA   PAST HISTORY: Past Medical History:  Past Medical History:  Diagnosis Date   Diabetes mellitus    takes Janumet and Invokana daily   Hernia, umbilical    Hypertension    takes Lisinopril daily   Plaque psoriasis    Seasonal allergies    takes Claritin daily and uses Flonase daily   Past Surgical History:  Past Surgical History:  Procedure Laterality Date   INSERTION OF MESH N/A 05/07/2016   Procedure: INSERTION OF MESH;  Surgeon: Erroll Luna, MD;  Location: [facility];  Service: General;  Laterality: N/A;   TYMPANOSTOMY TUBE PLACEMENT     VENTRAL HERNIA REPAIR N/A 05/07/2016   Procedure: REPAIR VENTRAL HERNIA;  Surgeon: Erroll Luna, MD;  Location: [facility];  Service: General;  Laterality: N/A;      Social History:  reports that he has never smoked. He has never used smokeless tobacco. He reports current alcohol use. He reports that he does not use drugs. Family History:  Family History  Problem Relation Age of Onset   Hypertension Mother    Diverticulosis Mother    Osteoporosis Mother    Colon polyps Mother    Diabetes Father    Alcohol abuse Father    Osteoporosis Sister    Hypertension Sister    Diabetes Brother    Hypertension Brother    Colon polyps Brother    Hypertension Maternal Grandmother    Heart disease Maternal Grandfather    Diabetes Paternal Grandfather    Diabetes Brother    Hypertension Brother    Heart attack Brother    Osteoporosis Sister  Colon cancer Neg Hx    Esophageal cancer Neg Hx    Stomach cancer Neg Hx    Rectal cancer Neg Hx      HOME MEDICATIONS: Allergies as of 04/05/2019      Reactions   Penicillins Hives, Rash   Has patient had a PCN reaction causing immediate rash, facial/tongue/throat swelling, SOB or lightheadedness with hypotension: Yes Has patient had a PCN reaction causing severe rash involving mucus membranes or skin necrosis: No Has patient had a PCN reaction that required hospitalization No Has patient had a PCN reaction occurring within the last 10 years: No If all of the above answers are "NO", then may proceed with Cephalosporin use.      Medication List         Accurate as of April 05, 2019  9:51 AM. If you have any questions, ask your nurse or doctor.        aspirin EC 81 MG tablet Take 81 mg by mouth daily.   atorvastatin 20 MG tablet Commonly known as: LIPITOR Take 1 tablet (20 mg total) by mouth daily at 6 PM.   B-complex with vitamin C tablet Take 1 tablet by mouth daily.   Bayer Microlet Lancets lancets TEST UP TO 3 TIMES A DAY   blood glucose meter kit and supplies Dispense based on patient and insurance preference. Use up to 3 times daily, uncontrolled diabetes.  Contour Next meter with test strips and lancets.   CINNAMON PO Take by mouth.   Contour Next Test test strip Generic drug: glucose blood TEST UP TO 3 TIMES A DAY   fluticasone 50 MCG/ACT nasal spray Commonly known as: FLONASE Place 1-2 sprays into both nostrils daily.   gabapentin 300 MG capsule Commonly known as: NEURONTIN Take 1 capsule (300 mg total) by mouth at bedtime.   insulin aspart 100 UNIT/ML FlexPen Commonly known as: NovoLOG FlexPen Inject 5 Units into the skin 3 (three) times daily with meals.   Insulin Pen Needle 31G X 5 MM Misc Commonly known as: B-D UF III MINI PEN NEEDLES USE AS DIRECTED DAILY   Lantus SoloStar 100 UNIT/ML Solostar Pen Generic drug: Insulin Glargine INJECT 52 UNITS INTO THE SKIN DAILY.   lisinopril 10 MG tablet Commonly known as: ZESTRIL Take 1 tablet (10 mg total) by mouth daily.   loratadine 10 MG tablet Commonly known as: CLARITIN Take 10 mg by mouth daily.   metFORMIN 1000 MG tablet Commonly known as: GLUCOPHAGE Take 1 tablet (1,000 mg total) by mouth 2 (two) times daily with a meal.   multivitamin with minerals Tabs tablet Take 1 tablet by mouth daily.   TURMERIC PO Take by mouth.   vitamin B-12 1000 MCG tablet Commonly known as: CYANOCOBALAMIN Take 1,000 mcg by mouth daily.   vitamin C 500 MG tablet Commonly known as: ASCORBIC ACID Take 500 mg by mouth daily.         ALLERGIES: Allergies  Allergen Reactions   Penicillins Hives and Rash    Has patient had a PCN reaction causing immediate rash, facial/tongue/throat swelling, SOB or lightheadedness with hypotension: Yes Has patient had a PCN reaction causing severe rash involving mucus membranes or skin necrosis: No Has patient had a PCN reaction that required hospitalization No Has patient had a PCN reaction occurring within the last 10 years: No If all of the above answers are "NO", then may proceed with Cephalosporin use.      REVIEW OF SYSTEMS: A comprehensive ROS was conducted  with the patient and is negative except as per HPI and below:  Review of Systems  Constitutional: Negative for chills and fever.  HENT: Negative for congestion and sore throat.   Eyes: Negative for blurred vision.  Respiratory: Negative for cough and shortness of breath.   Cardiovascular: Negative for chest pain and palpitations.  Gastrointestinal: Negative for diarrhea and nausea.  Genitourinary: Negative for frequency.  Neurological: Negative for tingling and headaches.  Endo/Heme/Allergies: Negative for polydipsia.  Psychiatric/Behavioral: Negative for depression. The patient is not nervous/anxious.       OBJECTIVE:   VITAL SIGNS: BP (!) 142/84 (BP Location: Right Arm, Patient Position: Sitting, Cuff Size: Normal)    Pulse 89    Temp 98.2 F (36.8 C)    Ht _0  (1.626 m)    Wt 187 lb (84.8 kg)    SpO2 98%    BMI 32.10 kg/m    PHYSICAL EXAM:  General: Pt appears well and is in NAD  Hydration: Well-hydrated with moist mucous membranes and good skin turgor  HEENT: Head: Unremarkable with good dentition. Oropharynx clear without exudate.  Eyes: External eye exam normal without stare, lid lag or exophthalmos.  EOM intact.   Neck: General: Supple without adenopathy or carotid bruits. Thyroid: Thyroid size normal.  No goiter or nodules appreciated. No thyroid bruit.  Lungs: Clear with good BS bilat with no  rales, rhonchi, or wheezes  Heart: RRR with normal S1 and S2 and no gallops; no murmurs; no rub  Abdomen: Normoactive bowel sounds, soft, nontender, without masses or organomegaly palpable  Extremities:  Lower extremities - No pretibial edema. No lesions.  Skin: Normal texture and temperature to palpation. No rash noted. No Acanthosis nigricans/skin tags. No lipohypertrophy.  Neuro: MS is good with appropriate affect, pt is alert and Ox3    DM foot exam: 04/05/2019  The skin of the feet is intact without sores or ulcerations. The pedal pulses are 2+ on right and 2+ on left. The sensation is intact to a screening 5.07, 10 gram monofilament bilaterally  DATA REVIEWED:  Lab Results  Component Value Date   HGBA1C 10.6 (A) 04/05/2019   HGBA1C 10.3 (H) 02/23/2019   HGBA1C 11.1 (H) 10/06/2018   Lab Results  Component Value Date   MICROALBUR 1.7 07/17/2015   LDLCALC 87 02/23/2019   CREATININE 0.91 02/23/2019    Lab Results  Component Value Date   CHOL 140 02/23/2019   HDL 37 (L) 02/23/2019   LDLCALC 87 02/23/2019   TRIG 82 02/23/2019   CHOLHDL 3.8 02/23/2019        ASSESSMENT / PLAN / RECOMMENDATIONS:   1) Type 2 Diabetes Mellitus, Poorly controlled, Without complications - Most recent A1c of 10.6 %. Goal A1c < 7.0%.    Plan: GENERAL: - Poorly controlled diabetes due to dietary  indiscretions and medication nona-dherence.  - I have discussed with the patient the pathophysiology of diabetes. We went over the natural progression of the disease. We talked about both insulin resistance and insulin deficiency. We stressed the importance of lifestyle changes including diet and exercise. I explained the complications associated with diabetes including retinopathy, nephropathy, neuropathy as well as increased risk of cardiovascular disease. We went over the benefit seen with glycemic control.   - I explained to the patient that diabetic patients are at higher than normal risk for  amputations.  -  We will adjust his insulin as below  - Discussed pharmacokinetics of basal/bolus insulin and the importance of taking  prandial insulin with meals.  We also discussed avoiding sugar-sweetened beverages and snacks, when possible.      MEDICATIONS: - Decrease Lantus to 50 units  - Increase Novolog to 12 units with Each Meal  - If your pre-meal glucose if over 200 , add 2 more units of Novolog to that meal  - Continue Metformin 1000 mg Twice daily     EDUCATION / INSTRUCTIONS:  BG monitoring instructions: Patient is instructed to check his blood sugars 4 times a day, before meals and bedtime.  Call [facility] if: BG persistently < 70 or > 300.  I reviewed the Rule of 15 for the treatment of hypoglycemia in detail with the patient. Literature supplied.   2) Diabetic complications:   Eye: Does not have known diabetic retinopathy.   Neuro/ Feet: Does not have known diabetic peripheral neuropathy.  Renal: Patient does not have known baseline CKD. He is  on an ACEI/ARB at present.   3) Lipids: Patient is  on a atorvastatin 20 mg . LDL has been fluctuating between 70-89 mg/dL. Question compliance. Discussed cardiovascular benefits of statins.    4) Hypertension: He is above goal of < 140/90 mmHg.    F/u in 3 months    Signed electronically by: Mack Guise, MD  [facility]  [facility] [address] [place], [place] [address] Phone: [phone] FAX: [phone]   CC: [name], MD [address] [place] [address] Phone: [phone]  Fax: [phone]    Return to Endocrinology clinic as below: Future Appointments  Date Time Provider [place]  04/11/2019  8:30 AM Magnus Sinning, MD [facility] None  04/14/2019  9:00 AM Clydell Hakim, RD [facility]  07/05/2019  8:10 AM Ahamed Hofland, Melanie Crazier, MD [facility] None

## 2019-04-11 ENCOUNTER — Encounter: Payer: BC Managed Care – PPO | Admitting: Physical Medicine and Rehabilitation

## 2019-04-14 ENCOUNTER — Ambulatory Visit: Payer: BC Managed Care – PPO | Admitting: Dietician

## 2019-04-14 ENCOUNTER — Ambulatory Visit: Payer: BC Managed Care – PPO | Admitting: Internal Medicine

## 2019-04-27 ENCOUNTER — Telehealth: Payer: Self-pay | Admitting: Internal Medicine

## 2019-04-27 ENCOUNTER — Telehealth: Payer: Self-pay | Admitting: Family Medicine

## 2019-04-27 NOTE — Telephone Encounter (Signed)
Patient has called in regards to losing his insurance. Patient would like to know if we have any copay cards for his Novolog and Lantus or possibly a patient assistance program.  Please Advise, Thanks  Please contact patient.

## 2019-04-27 NOTE — Telephone Encounter (Signed)
Spoke to pt and informed him that I have printed pt assistance applications for him and for him to stop by to pick them up.

## 2019-04-27 NOTE — Telephone Encounter (Signed)
Medications:  insulin aspart (NOVOLOG FLEXPEN) 100 UNIT/ML FlexPen  Insulin Glargine (LANTUS SOLOSTAR) 100 UNIT/ML Solostar Pen [    Patient is requesting a call back from [name] to discuss this medication. He states he can no longer afford them. He inquired if office has coupons or suggestions. Please contact.

## 2019-04-27 NOTE — Telephone Encounter (Signed)
Patient stated she can no longer afford this med is there anything else we can do for the patient

## 2019-04-28 NOTE — Telephone Encounter (Signed)
Patient is now followed by endocrinology for diabetes..  Please have him call their office as they may have less expensive options, medication assistance plan, or coupons available.

## 2019-04-28 NOTE — Telephone Encounter (Signed)
Pt dropped off the PAP information and I gave him 1 novolog and 1 lantus sample to hold him until next week as he was going to be out tonight

## 2019-04-28 NOTE — Telephone Encounter (Signed)
Spoke with pt and advised him of Dr. Carlota Raspberry recommendations, he verbalized understanding.

## 2019-05-01 NOTE — Telephone Encounter (Signed)
Applications have been faxed to [employer] with conformations received

## 2019-05-01 NOTE — Telephone Encounter (Signed)
Physicians section was placed on Dr. Quin Hoop desk last week for signature.

## 2019-05-10 ENCOUNTER — Other Ambulatory Visit: Payer: Self-pay | Admitting: Family Medicine

## 2019-05-10 DIAGNOSIS — J019 Acute sinusitis, unspecified: Secondary | ICD-10-CM

## 2019-05-10 DIAGNOSIS — R0981 Nasal congestion: Secondary | ICD-10-CM

## 2019-05-16 ENCOUNTER — Telehealth: Payer: Self-pay

## 2019-05-16 NOTE — Telephone Encounter (Signed)
lft vm informing pt that medication from pt assistance has arrived and ready for pick up

## 2019-05-25 ENCOUNTER — Ambulatory Visit: Payer: BC Managed Care – PPO | Admitting: Dietician

## 2019-06-02 ENCOUNTER — Encounter (INDEPENDENT_AMBULATORY_CARE_PROVIDER_SITE_OTHER): Payer: Self-pay

## 2019-06-20 ENCOUNTER — Telehealth: Payer: Self-pay

## 2019-06-20 NOTE — Telephone Encounter (Signed)
patient called in wanting to know if he could get paper work for the second medication he says its to expensive and also wanted to know when his medication is ready for pick up

## 2019-07-04 ENCOUNTER — Other Ambulatory Visit: Payer: Self-pay

## 2019-07-05 ENCOUNTER — Encounter: Payer: Self-pay | Admitting: Internal Medicine

## 2019-07-05 ENCOUNTER — Ambulatory Visit (INDEPENDENT_AMBULATORY_CARE_PROVIDER_SITE_OTHER): Payer: Self-pay | Admitting: Internal Medicine

## 2019-07-05 VITALS — BP 128/78 | HR 87 | Temp 98.4°F | Ht 64.0 in | Wt 194.4 lb

## 2019-07-05 DIAGNOSIS — Z794 Long term (current) use of insulin: Secondary | ICD-10-CM

## 2019-07-05 DIAGNOSIS — E1142 Type 2 diabetes mellitus with diabetic polyneuropathy: Secondary | ICD-10-CM

## 2019-07-05 DIAGNOSIS — E1165 Type 2 diabetes mellitus with hyperglycemia: Secondary | ICD-10-CM

## 2019-07-05 LAB — GLUCOSE, POCT (MANUAL RESULT ENTRY): POC Glucose: 209 mg/dl — AB (ref 70–99)

## 2019-07-05 LAB — POCT GLYCOSYLATED HEMOGLOBIN (HGB A1C): Hemoglobin A1C: 9.9 % — AB (ref 4.0–5.6)

## 2019-07-05 MED ORDER — TRULICITY 0.75 MG/0.5ML ~~LOC~~ SOAJ: 0.7500 mg | SUBCUTANEOUS | 3 refills | Status: DC

## 2019-07-05 NOTE — Progress Notes (Signed)
Name: Logan [name]  Age/ Sex: 48 y.o., male   MRN/ DOB: [id number], 1971-04-05     PCP: [name], MD   Reason for Endocrinology Evaluation: Type 2 Diabetes Mellitus  Initial Endocrine Consultative Visit: 04/05/2019    PATIENT IDENTIFIER: [name] is a 48 y.o. male with a past medical history of HTN,T2DM and Dyslipidemia . The patient has followed with Endocrinology clinic since 8/26/200 for consultative assistance with management of his diabetes.  DIABETIC HISTORY:  Logan [name] was diagnosed with T2DM in ~ 2010. Has been on metformin since his diagnosis. He has been on insulin for years. His hemoglobin A1c has ranged from 7.4%in 2016, peaking at 12.1% in 2019.  On his initial visit to our clinic he has an A1c 10.6% , he was on metformin and MDI regimen. We reduced basal insulin and increased prandial insulin.    SUBJECTIVE:   During the last visit (04/05/2019): A1c 10.6%. We reduced basal insulin and increased prandial insulin and continued metformin    Today (07/05/2019): Logan [name] is here for a 3 month follow up on diabetes management.  He checks his blood sugars daily, pt did not bring his meter today.He admits to skipping on prandial insulin a lot. The patient has not had hypoglycemic episodes since the last clinic visit.Otherwise, the patient has not required any recent emergency interventions for hypoglycemia and has not had recent hospitalizations secondary to hyper or hypoglycemic episodes.    ROS: As per HPI and as detailed below: Review of Systems  Constitutional: Negative for chills and fever.  HENT: Negative for congestion.   Cardiovascular: Negative for chest pain and palpitations.  Gastrointestinal: Negative for diarrhea and nausea.      HOME DIABETES REGIMEN:  - Lantus 50 units  - Novolog to 12 units with Each Meal  - If your pre-meal glucose if over 200 , add 2 more units of Novolog to that meal  - Continue Metformin 1000 mg Twice daily      METER DOWNLOAD SUMMARY: Did not bring    DIABETIC COMPLICATIONS: Microvascular complications:   Left retinopathy , neuropathy   Denies: CKD  Last eye exam: Completed 03/2019  Macrovascular complications:    Denies: CAD, PVD, CVA  HISTORY:  Past Medical History:  Past Medical History:  Diagnosis Date  . Diabetes mellitus    takes Janumet and Invokana daily  . Hernia, umbilical   . Hypertension    takes Lisinopril daily  . Plaque psoriasis   . Seasonal allergies    takes Claritin daily and uses Flonase daily   Past Surgical History:  Past Surgical History:  Procedure Laterality Date  . INSERTION OF MESH N/A 05/07/2016   Procedure: INSERTION OF MESH;  Surgeon: Erroll Luna, MD;  Location: [facility];  Service: General;  Laterality: N/A;  . TYMPANOSTOMY TUBE PLACEMENT    . VENTRAL HERNIA REPAIR N/A 05/07/2016   Procedure: REPAIR VENTRAL HERNIA;  Surgeon: Erroll Luna, MD;  Location: [facility];  Service: General;  Laterality: N/A;    Social History:  reports that he has never smoked. He has never used smokeless tobacco. He reports current alcohol use. He reports that he does not use drugs. Family History:  Family History  Problem Relation Age of Onset  . Hypertension Mother   . Diverticulosis Mother   . Osteoporosis Mother   . Colon polyps Mother   . Diabetes Father   . Alcohol abuse Father   . Osteoporosis Sister   .  Hypertension Sister   . Diabetes Brother   . Hypertension Brother   . Colon polyps Brother   . Hypertension Maternal Grandmother   . Heart disease Maternal Grandfather   . Diabetes Paternal Grandfather   . Diabetes Brother   . Hypertension Brother   . Heart attack Brother   . Osteoporosis Sister   . Colon cancer Neg Hx   . Esophageal cancer Neg Hx   . Stomach cancer Neg Hx   . Rectal cancer Neg Hx      HOME MEDICATIONS: Allergies as of 07/05/2019      Reactions   Penicillins Hives, Rash   Has patient had a PCN reaction causing  immediate rash, facial/tongue/throat swelling, SOB or lightheadedness with hypotension: Yes Has patient had a PCN reaction causing severe rash involving mucus membranes or skin necrosis: No Has patient had a PCN reaction that required hospitalization No Has patient had a PCN reaction occurring within the last 10 years: No If all of the above answers are "NO", then may proceed with Cephalosporin use.      Medication List       Accurate as of July 05, 2019  8:48 AM. If you have any questions, ask your nurse or doctor.        aspirin EC 81 MG tablet Take 81 mg by mouth daily.   atorvastatin 20 MG tablet Commonly known as: LIPITOR Take 1 tablet (20 mg total) by mouth daily at 6 PM.   B-complex with vitamin C tablet Take 1 tablet by mouth daily.   Bayer Microlet Lancets lancets TEST UP TO 3 TIMES A DAY   blood glucose meter kit and supplies Dispense based on patient and insurance preference. Use up to 3 times daily, uncontrolled diabetes.  Contour Next meter with test strips and lancets.   CINNAMON PO Take by mouth.   Contour Next Test test strip Generic drug: glucose blood TEST UP TO 3 TIMES A DAY   fluticasone 50 MCG/ACT nasal spray Commonly known as: FLONASE PLACE 1-2 SPRAYS INTO BOTH NOSTRILS DAILY.   gabapentin 300 MG capsule Commonly known as: NEURONTIN Take 1 capsule (300 mg total) by mouth at bedtime.   Insulin Pen Needle 32G X 4 MM Misc 4x daily   Lantus SoloStar 100 UNIT/ML Solostar Pen Generic drug: Insulin Glargine Inject 50 Units into the skin daily.   lisinopril 10 MG tablet Commonly known as: ZESTRIL Take 1 tablet (10 mg total) by mouth daily.   loratadine 10 MG tablet Commonly known as: CLARITIN Take 10 mg by mouth daily.   metFORMIN 1000 MG tablet Commonly known as: GLUCOPHAGE Take 1 tablet (1,000 mg total) by mouth 2 (two) times daily with a meal.   multivitamin with minerals Tabs tablet Take 1 tablet by mouth daily.   NovoLOG  FlexPen 100 UNIT/ML FlexPen Generic drug: insulin aspart Inject 12 Units into the skin 3 (three) times daily with meals. Max daily 42 units   TURMERIC PO Take by mouth.   vitamin B-12 1000 MCG tablet Commonly known as: CYANOCOBALAMIN Take 1,000 mcg by mouth daily.   vitamin C 500 MG tablet Commonly known as: ASCORBIC ACID Take 500 mg by mouth daily.        OBJECTIVE:   Vital Signs: BP 128/78 (BP Location: Left Arm, Patient Position: Sitting, Cuff Size: Large)   Pulse 87   Temp 98.4 F (36.9 C)   Ht _0  (1.626 m)   Wt 194 lb 6.4 oz (88.2 kg)  SpO2 97%   BMI 33.37 kg/m   Wt Readings from Last 3 Encounters:  07/05/19 194 lb 6.4 oz (88.2 kg)  04/05/19 187 lb (84.8 kg)  03/22/19 192 lb (87.1 kg)     Exam: General: Pt appears well and is in NAD  Lungs: Clear with good BS bilat with no rales, rhonchi, or wheezes  Heart: RRR with normal S1 and S2 and no gallops; no murmurs; no rub  Abdomen: Normoactive bowel sounds, soft, nontender, without masses or organomegaly palpable  Extremities: No pretibial edema.  Skin: Normal texture and temperature to palpation  Neuro: MS is good with appropriate affect, pt is alert and Ox3     DM foot exam: 04/05/2019  The skin of the feet is intact without sores or ulcerations. The pedal pulses are 2+ on right and 2+ on left. The sensation is intact to a screening 5.07, 10 gram monofilament bilaterally   DATA REVIEWED:  Lab Results  Component Value Date   HGBA1C 9.9 (A) 07/05/2019   HGBA1C 10.6 (A) 04/05/2019   HGBA1C 10.3 (H) 02/23/2019   Lab Results  Component Value Date   MICROALBUR 1.7 07/17/2015   LDLCALC 87 02/23/2019   CREATININE 0.91 02/23/2019   Lab Results  Component Value Date   MICRALBCREAT 32.7 (H) 04/25/2018    Lab Results  Component Value Date   CHOL 140 02/23/2019   HDL 37 (L) 02/23/2019   LDLCALC 87 02/23/2019   TRIG 82 02/23/2019   CHOLHDL 3.8 02/23/2019         ASSESSMENT / PLAN /  RECOMMENDATIONS:   1) Type 2 Diabetes Mellitus, Poorly controlled, With retinopathy and neuropathic complications - Most recent A1c of  9.9 %. Goal A1c < 7.0%.    - Pt with imperfect medication adherence, he also admits to continued dietary indiscretions  - We discussed add-on therapy with GLP-1 agonists, we discussed GI side effects. He has no personal hx of pancreatitis nor medullary cancer, brother on Ozempic and likes it.     MEDICATIONS: - Lantus 50 units  - Novolog to 12 units with Each Meal  - If your pre-meal glucose if over 200 , add 2 more units of Novolog to that meal  - Continue Metformin 1000 mg Twice daily  - Start trulicity 9.16 mg weekly    EDUCATION / INSTRUCTIONS:  BG monitoring instructions: Patient is instructed to check his blood sugars 4 times a day, before meals and bedtime.  Call [facility] if: BG persistently < 70 or > 300. . I reviewed the Rule of 15 for the treatment of hypoglycemia in detail with the patient. Literature supplied.     F/U in 3 months    Signed electronically by: Mack Guise, MD  [facility]  [facility] [address] [place], [place] [address] Phone: [phone] FAX: [phone]   CC: [name], MD [address] [place] [address] Phone: [phone]  Fax: [phone]  Return to Endocrinology clinic as below: No future appointments.

## 2019-07-05 NOTE — Patient Instructions (Signed)
-   Lantus 50 units  - Novolog to 12 units with Each Meal  - If your pre-meal glucose if over 200 , add 2 more units of Novolog to that meal  - Continue Metformin 1000 mg Twice daily  - Start trulicity [id number] mg weekly       HOW TO TREAT LOW BLOOD SUGARS (Blood sugar LESS THAN 70 MG/DL)  Please follow the RULE OF 15 for the treatment of hypoglycemia treatment (when your (blood sugars are less than 70 mg/dL)    STEP 1: Take 15 grams of carbohydrates when your blood sugar is low, which includes:   3-4 GLUCOSE TABS  OR  3-4 OZ OF JUICE OR REGULAR SODA OR  ONE TUBE OF GLUCOSE GEL     STEP 2: RECHECK blood sugar in 15 MINUTES STEP 3: If your blood sugar is still low at the 15 minute recheck --> then, go back to STEP 1 and treat AGAIN with another 15 grams of carbohydrates.

## 2019-07-13 NOTE — Telephone Encounter (Signed)
Pt informed that lantus has arrived from pt assistance and is ready for pick up, pt also stated that he gave wrong insurance card. Pt was instructed to bring correct card when he comes to pick up medication so that it can be scanned into chart.

## 2019-07-27 ENCOUNTER — Other Ambulatory Visit: Payer: Self-pay | Admitting: Family Medicine

## 2019-07-27 DIAGNOSIS — Z794 Long term (current) use of insulin: Secondary | ICD-10-CM

## 2019-07-27 DIAGNOSIS — E1165 Type 2 diabetes mellitus with hyperglycemia: Secondary | ICD-10-CM

## 2019-07-27 NOTE — Telephone Encounter (Signed)
Forwarding medication refill request to the clinica; pool for review. 

## 2019-08-02 ENCOUNTER — Other Ambulatory Visit: Payer: Self-pay | Admitting: Family Medicine

## 2019-08-02 DIAGNOSIS — E785 Hyperlipidemia, unspecified: Secondary | ICD-10-CM

## 2019-08-02 NOTE — Telephone Encounter (Signed)
Requested Prescriptions  Pending Prescriptions Disp Refills  . atorvastatin (LIPITOR) 20 MG tablet [Pharmacy Med Name: ATORVASTATIN 20 MG TABLET] 90 tablet 1    Sig: TAKE 1 TABLET (20 MG TOTAL) BY MOUTH DAILY AT 6 PM.     Cardiovascular:  Antilipid - Statins Failed - 08/02/2019  1:01 AM      Failed - HDL in normal range and within 360 days    HDL  Date Value Ref Range Status  02/23/2019 37 (L) >39 mg/dL Final         Passed - Total Cholesterol in normal range and within 360 days    Cholesterol, Total  Date Value Ref Range Status  02/23/2019 140 100 - 199 mg/dL Final         Passed - LDL in normal range and within 360 days    LDL Calculated  Date Value Ref Range Status  02/23/2019 87 0 - 99 mg/dL Final         Passed - Triglycerides in normal range and within 360 days    Triglycerides  Date Value Ref Range Status  02/23/2019 82 0 - 149 mg/dL Final         Passed - Patient is not pregnant      Passed - Valid encounter within last 12 months    Recent Outpatient Visits          4 months ago Annual physical exam   [facility], Ranell Patrick, MD   5 months ago Type 2 diabetes mellitus with hyperglycemia, with long-term current use of insulin [facility])   [facility], Ranell Patrick, MD   9 months ago    [facility], Ranell Patrick, MD   9 months ago Type 2 diabetes mellitus with hyperglycemia, with long-term current use of insulin [facility])   [facility], MD   10 months ago Essential hypertension   [facility], Ranell Patrick, MD

## 2019-09-07 ENCOUNTER — Telehealth: Payer: Self-pay | Admitting: Internal Medicine

## 2019-09-07 NOTE — Telephone Encounter (Signed)
Spoke to pt and informed him that we have had that issue with other pt's and that the problem was that a new application had to be submitted because it is a new year. Pt asked me to mail application to him.

## 2019-09-07 NOTE — Telephone Encounter (Signed)
Patient requests to be called at ph# [phone] re: Patient filled out assistance forms for Lantus Solostar Pen while at our office for a follow up. Patient requests contact information for the company that is supposed to send the above medication to our office for patient to pick up. Patient did receive an approval letter from said company that stated the above medication would be shipped to our office monthly, however patient is unable to locate letter at this time to call them. Patient has previously picked up the above medication 2 or 3 different times.

## 2019-10-05 ENCOUNTER — Ambulatory Visit: Payer: BLUE CROSS/BLUE SHIELD | Admitting: Internal Medicine

## 2019-10-26 ENCOUNTER — Other Ambulatory Visit: Payer: Self-pay | Admitting: Internal Medicine

## 2019-11-07 ENCOUNTER — Other Ambulatory Visit: Payer: Self-pay | Admitting: Internal Medicine

## 2019-11-07 DIAGNOSIS — Z794 Long term (current) use of insulin: Secondary | ICD-10-CM

## 2019-11-07 DIAGNOSIS — E1165 Type 2 diabetes mellitus with hyperglycemia: Secondary | ICD-10-CM

## 2019-11-12 ENCOUNTER — Other Ambulatory Visit: Payer: Self-pay | Admitting: Family Medicine

## 2019-11-12 NOTE — Telephone Encounter (Signed)
Requested Prescriptions  Pending Prescriptions Disp Refills  . CONTOUR NEXT TEST test strip [Pharmacy Med Name: CONTOUR NEXT TEST STRIP] 100 strip 3    Sig: TEST UP TO 3 TIMES A DAY     Endocrinology: Diabetes - Testing Supplies Passed - 11/12/2019 12:53 AM      Passed - Valid encounter within last 12 months    Recent Outpatient Visits          8 months ago Annual physical exam   [facility], MD   8 months ago Type 2 diabetes mellitus with hyperglycemia, with long-term current use of insulin [facility])   [facility], Ranell Patrick, MD   1 year ago    [facility], Ranell Patrick, MD   1 year ago Type 2 diabetes mellitus with hyperglycemia, with long-term current use of insulin [facility])   [facility], Ranell Patrick, MD   1 year ago Essential hypertension   [facility], Ranell Patrick, MD

## 2019-11-27 ENCOUNTER — Other Ambulatory Visit: Payer: Self-pay

## 2019-11-27 ENCOUNTER — Other Ambulatory Visit: Payer: Self-pay | Admitting: Internal Medicine

## 2019-11-27 DIAGNOSIS — E1165 Type 2 diabetes mellitus with hyperglycemia: Secondary | ICD-10-CM

## 2019-11-27 DIAGNOSIS — Z794 Long term (current) use of insulin: Secondary | ICD-10-CM

## 2019-11-27 MED ORDER — TRULICITY 0.75 MG/0.5ML ~~LOC~~ SOAJ: 0.7500 mg | SUBCUTANEOUS | 0 refills | Status: DC

## 2019-11-29 ENCOUNTER — Other Ambulatory Visit: Payer: Self-pay | Admitting: Internal Medicine

## 2019-11-29 DIAGNOSIS — E1165 Type 2 diabetes mellitus with hyperglycemia: Secondary | ICD-10-CM

## 2019-12-23 ENCOUNTER — Other Ambulatory Visit: Payer: Self-pay | Admitting: Internal Medicine

## 2020-02-26 IMAGING — DX LEFT ANKLE COMPLETE - 3+ VIEW
4 series · 4 of 4 positions shown · non-contrast
Comparison: No recent prior.

CLINICAL DATA: Left ankle pain.

EXAM:
LEFT ANKLE COMPLETE - 3+ VIEW

[ankle obl (1 of 2)]
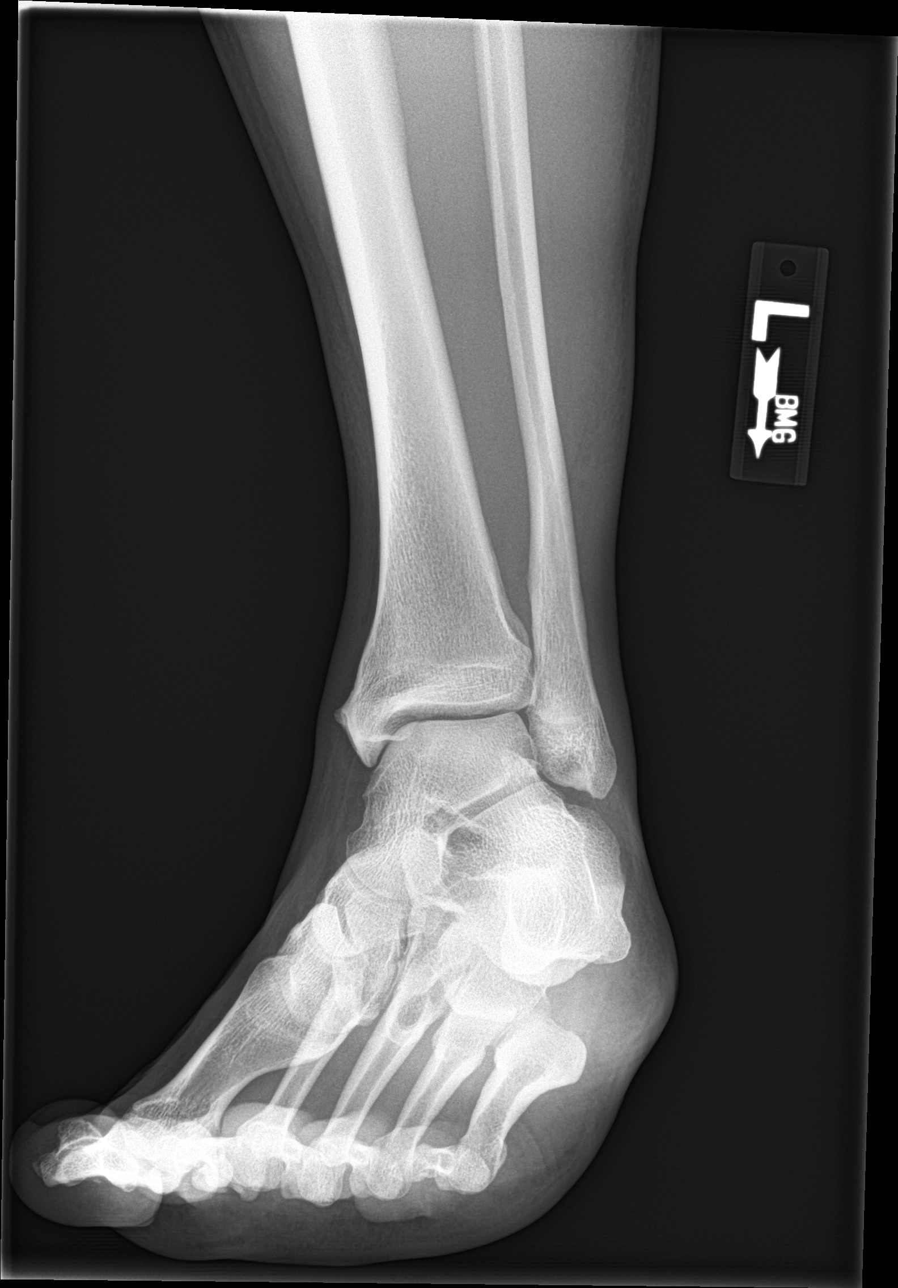

[ankle ap]
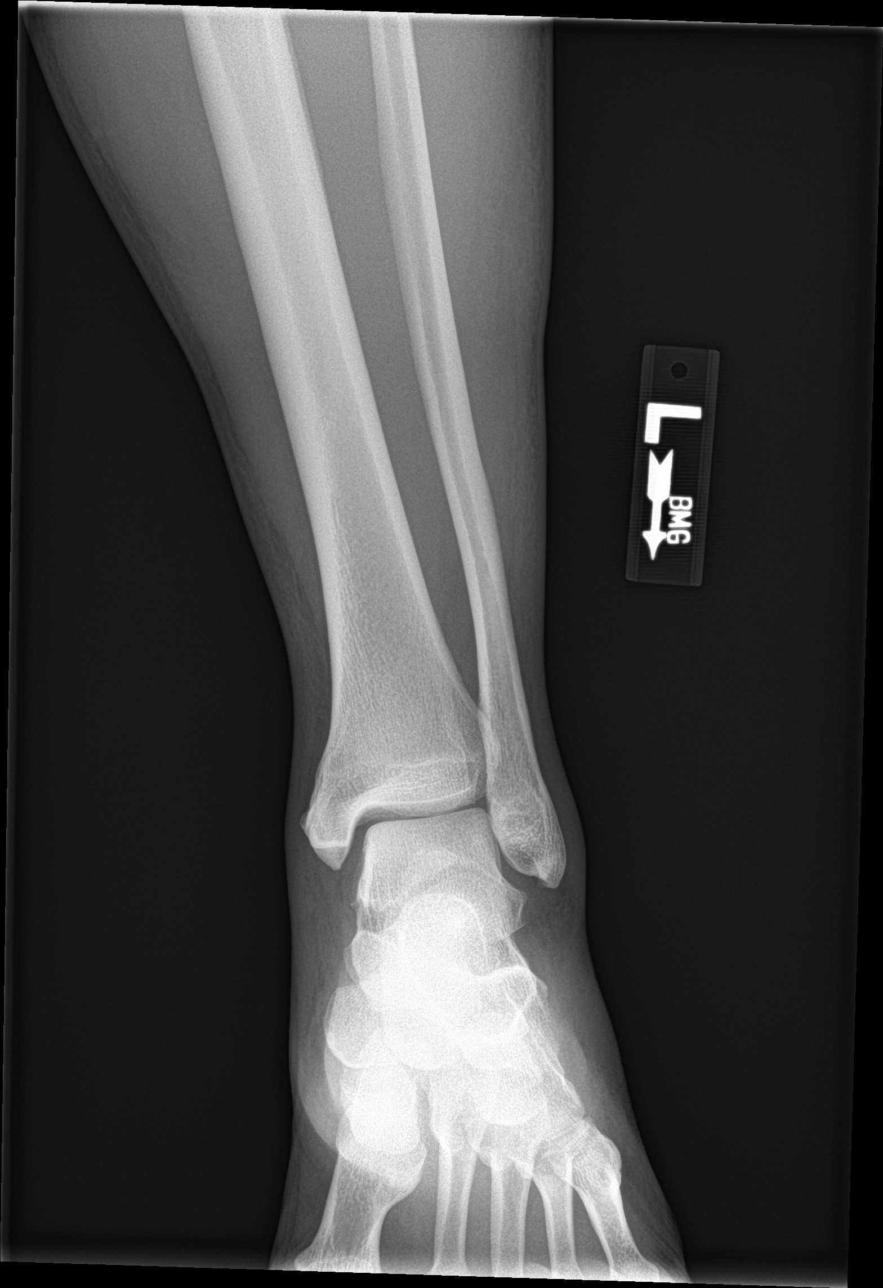

[ankle lat]
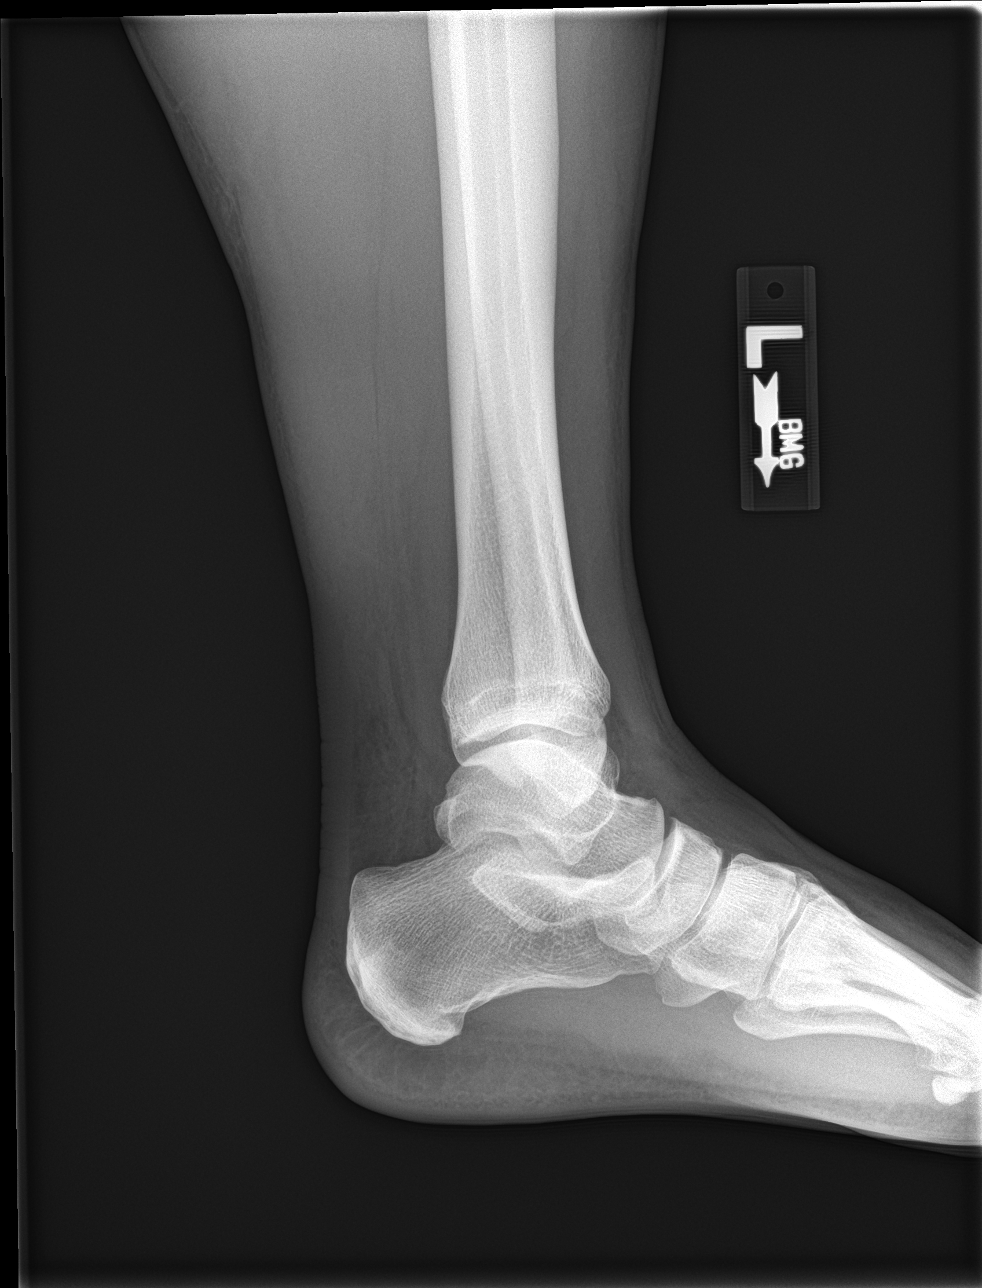

[ankle obl (2 of 2)]
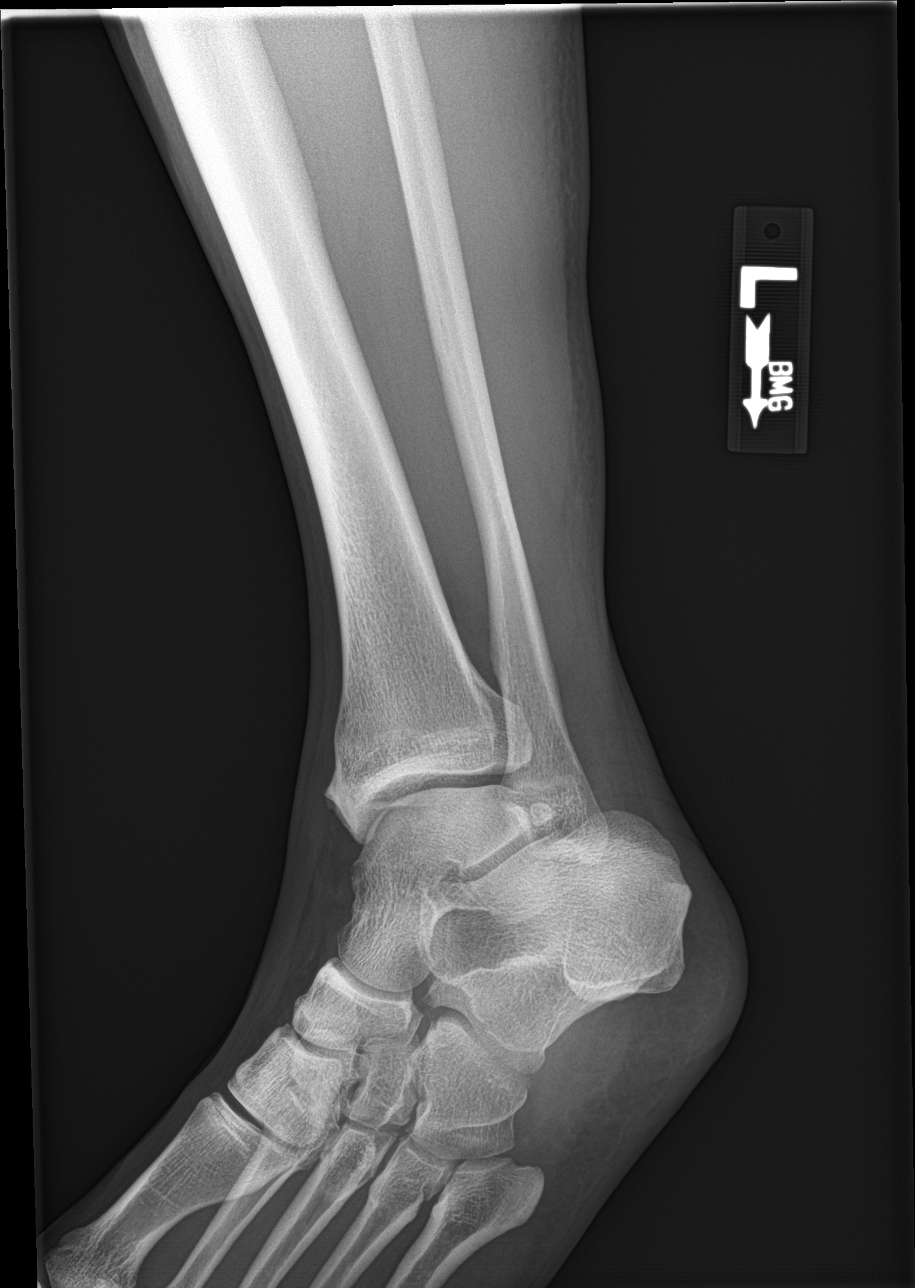

[4 of 4 positions shown; findings below may reference images not displayed]

FINDINGS: No acute bony or joint abnormality. No evidence of fracture or
dislocation.
IMPRESSION: No acute abnormality.

## 2020-10-13 ENCOUNTER — Other Ambulatory Visit: Payer: Self-pay | Admitting: Internal Medicine

## 2020-10-14 ENCOUNTER — Encounter: Payer: Self-pay | Admitting: Internal Medicine

## 2020-10-18 ENCOUNTER — Telehealth: Payer: Self-pay | Admitting: Internal Medicine

## 2020-10-18 NOTE — Telephone Encounter (Signed)
Patient dismissed from [facility] by Vivia Ewing, MD, effective 10/14/20. Dismissal Letter sent out by 1st class mail. [employer]

## 2020-11-01 ENCOUNTER — Other Ambulatory Visit: Payer: Self-pay | Admitting: Internal Medicine

## 2020-11-15 ENCOUNTER — Inpatient Hospital Stay (HOSPITAL_BASED_OUTPATIENT_CLINIC_OR_DEPARTMENT_OTHER)
Admission: EM | Admit: 2020-11-15 | Discharge: 2020-11-22 | DRG: 854 | Disposition: A | Discharge status: Home Health | Payer: Commercial Managed Care - PPO | Attending: Internal Medicine | Admitting: Internal Medicine

## 2020-11-15 ENCOUNTER — Other Ambulatory Visit: Payer: Self-pay

## 2020-11-15 ENCOUNTER — Emergency Department (HOSPITAL_BASED_OUTPATIENT_CLINIC_OR_DEPARTMENT_OTHER): Payer: Commercial Managed Care - PPO

## 2020-11-15 ENCOUNTER — Encounter (HOSPITAL_BASED_OUTPATIENT_CLINIC_OR_DEPARTMENT_OTHER): Payer: Self-pay

## 2020-11-15 DIAGNOSIS — Z833 Family history of diabetes mellitus: Secondary | ICD-10-CM

## 2020-11-15 DIAGNOSIS — M609 Myositis, unspecified: Secondary | ICD-10-CM | POA: Diagnosis present

## 2020-11-15 DIAGNOSIS — M25512 Pain in left shoulder: Secondary | ICD-10-CM

## 2020-11-15 DIAGNOSIS — E785 Hyperlipidemia, unspecified: Secondary | ICD-10-CM | POA: Diagnosis present

## 2020-11-15 DIAGNOSIS — I10 Essential (primary) hypertension: Secondary | ICD-10-CM | POA: Diagnosis present

## 2020-11-15 DIAGNOSIS — E1165 Type 2 diabetes mellitus with hyperglycemia: Secondary | ICD-10-CM | POA: Diagnosis present

## 2020-11-15 DIAGNOSIS — Z7984 Long term (current) use of oral hypoglycemic drugs: Secondary | ICD-10-CM | POA: Diagnosis not present

## 2020-11-15 DIAGNOSIS — M25539 Pain in unspecified wrist: Secondary | ICD-10-CM

## 2020-11-15 DIAGNOSIS — E1140 Type 2 diabetes mellitus with diabetic neuropathy, unspecified: Secondary | ICD-10-CM | POA: Diagnosis present

## 2020-11-15 DIAGNOSIS — Z880 Allergy status to penicillin: Secondary | ICD-10-CM

## 2020-11-15 DIAGNOSIS — A419 Sepsis, unspecified organism: Principal | ICD-10-CM | POA: Diagnosis present

## 2020-11-15 DIAGNOSIS — Z7982 Long term (current) use of aspirin: Secondary | ICD-10-CM

## 2020-11-15 DIAGNOSIS — M7502 Adhesive capsulitis of left shoulder: Secondary | ICD-10-CM | POA: Diagnosis present

## 2020-11-15 DIAGNOSIS — K5900 Constipation, unspecified: Secondary | ICD-10-CM | POA: Diagnosis present

## 2020-11-15 DIAGNOSIS — M00812 Arthritis due to other bacteria, left shoulder: Secondary | ICD-10-CM | POA: Diagnosis not present

## 2020-11-15 DIAGNOSIS — M009 Pyogenic arthritis, unspecified: Secondary | ICD-10-CM

## 2020-11-15 DIAGNOSIS — Z8249 Family history of ischemic heart disease and other diseases of the circulatory system: Secondary | ICD-10-CM

## 2020-11-15 DIAGNOSIS — R000 Tachycardia, unspecified: Secondary | ICD-10-CM | POA: Diagnosis present

## 2020-11-15 DIAGNOSIS — L400 Psoriasis vulgaris: Secondary | ICD-10-CM | POA: Diagnosis present

## 2020-11-15 DIAGNOSIS — Z794 Long term (current) use of insulin: Secondary | ICD-10-CM

## 2020-11-15 DIAGNOSIS — Z79899 Other long term (current) drug therapy: Secondary | ICD-10-CM

## 2020-11-15 DIAGNOSIS — Z20822 Contact with and (suspected) exposure to covid-19: Secondary | ICD-10-CM | POA: Diagnosis present

## 2020-11-15 DIAGNOSIS — L409 Psoriasis, unspecified: Secondary | ICD-10-CM | POA: Diagnosis not present

## 2020-11-15 DIAGNOSIS — R52 Pain, unspecified: Secondary | ICD-10-CM

## 2020-11-15 DIAGNOSIS — Z5181 Encounter for therapeutic drug level monitoring: Secondary | ICD-10-CM | POA: Diagnosis not present

## 2020-11-15 DIAGNOSIS — E119 Type 2 diabetes mellitus without complications: Secondary | ICD-10-CM | POA: Diagnosis not present

## 2020-11-15 LAB — COMPREHENSIVE METABOLIC PANEL
ALT: 43 U/L (ref 0–44)
AST: 43 U/L — ABNORMAL HIGH (ref 15–41)
Albumin: 3 g/dL — ABNORMAL LOW (ref 3.5–5.0)
Alkaline Phosphatase: 179 U/L — ABNORMAL HIGH (ref 38–126)
Anion gap: 14 (ref 5–15)
BUN: 19 mg/dL (ref 6–20)
CO2: 19 mmol/L — ABNORMAL LOW (ref 22–32)
Calcium: 8.6 mg/dL — ABNORMAL LOW (ref 8.9–10.3)
Chloride: 99 mmol/L (ref 98–111)
Creatinine, Ser: 0.9 mg/dL (ref 0.61–1.24)
GFR, Estimated: >60 mL/min (ref >60)
Glucose, Bld: 236 mg/dL — ABNORMAL HIGH (ref 70–99)
Potassium: 4.1 mmol/L (ref 3.5–5.1)
Sodium: 132 mmol/L — ABNORMAL LOW (ref 135–145)
Total Bilirubin: 1.1 mg/dL (ref 0.3–1.2)
Total Protein: 7.4 g/dL (ref 6.5–8.1)

## 2020-11-15 LAB — CBC WITH DIFFERENTIAL/PLATELET
Abs Immature Granulocytes: 0.14 10*3/uL — ABNORMAL HIGH (ref 0.00–0.07)
Basophils Absolute: 0.1 10*3/uL (ref 0.0–0.1)
Basophils Relative: 0 %
Eosinophils Absolute: 0 10*3/uL (ref 0.0–0.5)
Eosinophils Relative: 0 %
HCT: 40.4 % (ref 39.0–52.0)
Hemoglobin: 13.5 g/dL (ref 13.0–17.0)
Immature Granulocytes: 1 %
Lymphocytes Relative: 7 %
Lymphs Abs: 1.1 10*3/uL (ref 0.7–4.0)
MCH: 29.5 pg (ref 26.0–34.0)
MCHC: 33.4 g/dL (ref 30.0–36.0)
MCV: 88.2 fL (ref 80.0–100.0)
Monocytes Absolute: 1.5 10*3/uL — ABNORMAL HIGH (ref 0.1–1.0)
Monocytes Relative: 9 %
Neutro Abs: 14.5 10*3/uL — ABNORMAL HIGH (ref 1.7–7.7)
Neutrophils Relative %: 83 %
Platelets: 444 10*3/uL — ABNORMAL HIGH (ref 150–400)
RBC: 4.58 MIL/uL (ref 4.22–5.81)
RDW: 14 % (ref 11.5–15.5)
WBC: 17.4 10*3/uL — ABNORMAL HIGH (ref 4.0–10.5)
nRBC: 0 % (ref 0.0–0.2)

## 2020-11-15 LAB — URINALYSIS, ROUTINE W REFLEX MICROSCOPIC
Bilirubin Urine: NEGATIVE
Glucose, UA: >=500 mg/dL — AB
Hgb urine dipstick: NEGATIVE
Ketones, ur: >80 mg/dL — AB
Leukocytes,Ua: NEGATIVE
Nitrite: NEGATIVE
Protein, ur: NEGATIVE mg/dL
Specific Gravity, Urine: <1.005 — ABNORMAL LOW (ref 1.005–1.030)
pH: 5 (ref 5.0–8.0)

## 2020-11-15 LAB — URINALYSIS, MICROSCOPIC (REFLEX): Bacteria, UA: NONE SEEN

## 2020-11-15 LAB — RESP PANEL BY RT-PCR (FLU A&B, COVID) ARPGX2
Influenza A by PCR: NEGATIVE
Influenza B by PCR: NEGATIVE
SARS Coronavirus 2 by RT PCR: NEGATIVE

## 2020-11-15 LAB — LACTIC ACID, PLASMA
Lactic Acid, Venous: 1.6 mmol/L (ref 0.5–1.9)
Lactic Acid, Venous: 1.1 mmol/L (ref 0.5–1.9)

## 2020-11-15 LAB — RAPID URINE DRUG SCREEN, HOSP PERFORMED
Amphetamines: NOT DETECTED
Barbiturates: NOT DETECTED
Benzodiazepines: NOT DETECTED
Cocaine: NOT DETECTED
Opiates: NOT DETECTED
Tetrahydrocannabinol: NOT DETECTED

## 2020-11-15 LAB — PROTIME-INR
INR: 1 (ref 0.8–1.2)
Prothrombin Time: 12.4 seconds (ref 11.4–15.2)

## 2020-11-15 LAB — C-REACTIVE PROTEIN: CRP: 20.4 mg/dL — ABNORMAL HIGH (ref <1.0)

## 2020-11-15 LAB — SEDIMENTATION RATE: Sed Rate: 104 mm/hr — ABNORMAL HIGH (ref 0–16)

## 2020-11-15 LAB — APTT: aPTT: 33 seconds (ref 24–36)

## 2020-11-15 MED ORDER — METRONIDAZOLE IN NACL 5-0.79 MG/ML-% IV SOLN
500.0000 mg | Freq: Three times a day (TID) | INTRAVENOUS | Status: DC
  Administered 2020-11-15: 500 mg via INTRAVENOUS
  Filled 2020-11-15 (×2): qty 100

## 2020-11-15 MED ORDER — EMPAGLIFLOZIN 25 MG PO TABS
25.0000 mg | ORAL_TABLET | Freq: Every day | ORAL | Status: DC
  Filled 2020-11-15: qty 1

## 2020-11-15 MED ORDER — SODIUM CHLORIDE 0.9 % IV SOLN: 2.0000 g | Freq: Once | INTRAVENOUS | Status: DC

## 2020-11-15 MED ORDER — ENOXAPARIN SODIUM 40 MG/0.4ML ~~LOC~~ SOLN
40.0000 mg | SUBCUTANEOUS | Status: DC
  Administered 2020-11-15 – 2020-11-21 (×7): 40 mg via SUBCUTANEOUS
  Filled 2020-11-15 (×7): qty 0.4

## 2020-11-15 MED ORDER — ACETAMINOPHEN 650 MG RE SUPP: 650.0000 mg | Freq: Four times a day (QID) | RECTAL | Status: DC | PRN

## 2020-11-15 MED ORDER — SODIUM CHLORIDE 0.9 % IV SOLN: INTRAVENOUS | Status: AC

## 2020-11-15 MED ORDER — ACETAMINOPHEN 325 MG PO TABS
650.0000 mg | ORAL_TABLET | Freq: Four times a day (QID) | ORAL | Status: DC | PRN
  Administered 2020-11-16 – 2020-11-22 (×8): 650 mg via ORAL
  Filled 2020-11-15 (×8): qty 2

## 2020-11-15 MED ORDER — INSULIN ASPART 100 UNIT/ML FLEXPEN: 12.0000 [IU] | PEN_INJECTOR | Freq: Three times a day (TID) | SUBCUTANEOUS | Status: DC

## 2020-11-15 MED ORDER — FLUTICASONE PROPIONATE 50 MCG/ACT NA SUSP
1.0000 | Freq: Every day | NASAL | Status: DC | PRN
  Filled 2020-11-15 (×2): qty 16

## 2020-11-15 MED ORDER — VANCOMYCIN HCL IN DEXTROSE 1-5 GM/200ML-% IV SOLN
1000.0000 mg | Freq: Once | INTRAVENOUS | Status: AC
  Administered 2020-11-15: 1000 mg via INTRAVENOUS
  Filled 2020-11-15: qty 200

## 2020-11-15 MED ORDER — INSULIN GLARGINE 100 UNIT/ML SOLOSTAR PEN: 50.0000 [IU] | PEN_INJECTOR | Freq: Every day | SUBCUTANEOUS | Status: DC

## 2020-11-15 MED ORDER — ASPIRIN EC 81 MG PO TBEC
81.0000 mg | DELAYED_RELEASE_TABLET | Freq: Every day | ORAL | Status: DC
  Administered 2020-11-15 – 2020-11-22 (×8): 81 mg via ORAL
  Filled 2020-11-15 (×8): qty 1

## 2020-11-15 MED ORDER — LIDOCAINE-EPINEPHRINE 2 %-1:100000 IJ SOLN: 20.0000 mL | Freq: Once | INTRAMUSCULAR | Status: DC

## 2020-11-15 MED ORDER — LACTATED RINGERS IV BOLUS (SEPSIS)
1000.0000 mL | Freq: Once | INTRAVENOUS | Status: AC
  Administered 2020-11-15: 1000 mL via INTRAVENOUS

## 2020-11-15 MED ORDER — METRONIDAZOLE IN NACL 5-0.79 MG/ML-% IV SOLN: 500.0000 mg | Freq: Once | INTRAVENOUS | Status: DC

## 2020-11-15 MED ORDER — SODIUM CHLORIDE 0.9 % IV SOLN
2.0000 g | Freq: Three times a day (TID) | INTRAVENOUS | Status: DC
  Administered 2020-11-15 – 2020-11-19 (×11): 2 g via INTRAVENOUS
  Filled 2020-11-15 (×13): qty 2

## 2020-11-15 MED ORDER — HYDROMORPHONE HCL 1 MG/ML IJ SOLN
1.0000 mg | Freq: Once | INTRAMUSCULAR | Status: AC
Start: 2020-11-15 — End: 2020-11-15
  Administered 2020-11-15: 1 mg via INTRAVENOUS
  Filled 2020-11-15: qty 1

## 2020-11-15 MED ORDER — KETOROLAC TROMETHAMINE 30 MG/ML IJ SOLN
30.0000 mg | Freq: Four times a day (QID) | INTRAMUSCULAR | Status: DC | PRN
  Administered 2020-11-15 – 2020-11-17 (×4): 30 mg via INTRAVENOUS
  Filled 2020-11-15 (×4): qty 1

## 2020-11-15 MED ORDER — HYDROMORPHONE HCL 1 MG/ML IJ SOLN
1.0000 mg | Freq: Once | INTRAMUSCULAR | Status: AC
  Administered 2020-11-15: 1 mg via INTRAVENOUS
  Filled 2020-11-15: qty 1

## 2020-11-15 MED ORDER — METFORMIN HCL 500 MG PO TABS: 1000.0000 mg | ORAL_TABLET | Freq: Two times a day (BID) | ORAL | Status: DC

## 2020-11-15 MED ORDER — LIDOCAINE-EPINEPHRINE (PF) 2 %-1:200000 IJ SOLN
20.0000 mL | Freq: Once | INTRAMUSCULAR | Status: AC
  Administered 2020-11-15: 20 mL
  Filled 2020-11-15: qty 20

## 2020-11-15 MED ORDER — ATORVASTATIN CALCIUM 10 MG PO TABS
20.0000 mg | ORAL_TABLET | Freq: Every day | ORAL | Status: DC
  Administered 2020-11-16 – 2020-11-21 (×6): 20 mg via ORAL
  Filled 2020-11-15 (×6): qty 2

## 2020-11-15 MED ORDER — GABAPENTIN 300 MG PO CAPS
300.0000 mg | ORAL_CAPSULE | Freq: Every day | ORAL | Status: DC
  Administered 2020-11-15 – 2020-11-21 (×7): 300 mg via ORAL
  Filled 2020-11-15 (×7): qty 1

## 2020-11-15 MED ORDER — VITAMIN B-12 1000 MCG PO TABS
1000.0000 ug | ORAL_TABLET | Freq: Every day | ORAL | Status: DC
  Administered 2020-11-16 – 2020-11-22 (×7): 1000 ug via ORAL
  Filled 2020-11-15 (×7): qty 1

## 2020-11-15 MED ORDER — LACTATED RINGERS IV BOLUS
1250.0000 mL | Freq: Once | INTRAVENOUS | Status: AC
  Administered 2020-11-15: 1250 mL via INTRAVENOUS

## 2020-11-15 MED ORDER — VANCOMYCIN HCL 750 MG/150ML IV SOLN
750.0000 mg | Freq: Two times a day (BID) | INTRAVENOUS | Status: DC
  Administered 2020-11-16 – 2020-11-20 (×9): 750 mg via INTRAVENOUS
  Filled 2020-11-15 (×12): qty 150

## 2020-11-15 MED ORDER — ACETAMINOPHEN 500 MG PO TABS
1000.0000 mg | ORAL_TABLET | Freq: Once | ORAL | Status: AC
  Administered 2020-11-15: 1000 mg via ORAL
  Filled 2020-11-15: qty 2

## 2020-11-15 MED ORDER — LORATADINE 10 MG PO TABS: 10.0000 mg | ORAL_TABLET | Freq: Every day | ORAL | Status: DC | PRN

## 2020-11-15 MED ORDER — INSULIN ASPART 100 UNIT/ML ~~LOC~~ SOLN
0.0000 [IU] | Freq: Three times a day (TID) | SUBCUTANEOUS | Status: DC
  Administered 2020-11-16: 3 [IU] via SUBCUTANEOUS
  Administered 2020-11-18 (×2): 2 [IU] via SUBCUTANEOUS
  Administered 2020-11-19: 3 [IU] via SUBCUTANEOUS
  Administered 2020-11-19: 5 [IU] via SUBCUTANEOUS
  Administered 2020-11-20: 2 [IU] via SUBCUTANEOUS
  Administered 2020-11-20: 11 [IU] via SUBCUTANEOUS
  Administered 2020-11-21: 5 [IU] via SUBCUTANEOUS
  Administered 2020-11-21: 3 [IU] via SUBCUTANEOUS
  Administered 2020-11-21: 8 [IU] via SUBCUTANEOUS
  Administered 2020-11-22: 11 [IU] via SUBCUTANEOUS
  Administered 2020-11-22: 3 [IU] via SUBCUTANEOUS

## 2020-11-15 NOTE — Progress Notes (Signed)
Ortho Note  I have been notified of this patient from Hilbert Odor, PA-C. Recommend MRI of shoulder when patient arrives to [facility]. Will need to assess if glenohumeral joint has effusion. If so, will need IR aspiration. We will consult on the patient tomorrow AM.  Shona Needles, MD [facility] [phone] (office) [email or web address]

## 2020-11-15 NOTE — Progress Notes (Signed)
Pharmacy Antibiotic Note  Logan [name] is a 50 y.o. male admitted on 11/15/2020 with sepsis.  Pharmacy has been consulted for vancomycin and cefepime dosing. Pt is febrile with Tmax 101.4 and WBC is elevated at 17.4. SCr is WNL and lactic acid is <2.   Plan: Vanc 1gm IV x 1 then 750mg  IV Q12H Cefepime 2gm IV Q8H F/u renal fxn, C&S, clinical status and peak/trough at SS Narrow as able  Height: 5\' 5"  (165.1 cm) Weight: 82.1 kg (181 lb) IBW/kg (Calculated) : 61.5  Temp (24hrs), Avg:100.5 F (38.1 C), Min:99.6 F (37.6 C), Max:101.4 F (38.6 C)  Recent Labs  Lab 11/15/20 1326  WBC 17.4*  CREATININE 0.90  LATICACIDVEN 1.6    Estimated Creatinine Clearance: 95.7 mL/min (by C-G formula based on SCr of 0.9 mg/dL).    Allergies  Allergen Reactions  . Penicillins Hives and Rash    Has patient had a PCN reaction causing immediate rash, facial/tongue/throat swelling, SOB or lightheadedness with hypotension: Yes Has patient had a PCN reaction causing severe rash involving mucus membranes or skin necrosis: No Has patient had a PCN reaction that required hospitalization No Has patient had a PCN reaction occurring within the last 10 years: No If all of the above answers are "NO", then may proceed with Cephalosporin use.     Antimicrobials this admission: Vanc 4/8>> Cefepime 4/8>> Flagyl 4/8>>  Dose adjustments this admission: N/A  Microbiology results: Pending  Thank you for allowing pharmacy to be a part of this patient's care.  Taiwan Talcott, Rande Lawman 11/15/2020 3:39 PM

## 2020-11-15 NOTE — H&P (Signed)
History and Physical    [name] UTM:[id number] DOB: 1970/10/27 DOA: 11/15/2020  PCP: Rudene Anda, MD (Confirm with patient/family/NH records and if not entered, this has to be entered at [name] point of entry) Patient coming from: Home  I have personally briefly reviewed patient's old medical records in [name]  Chief Complaint: Right shoulder pain, fever.  HPI: [name] is a 50 y.o. male with medical history significant of IDDM, diabetic neuropathy, HTN, psoriasis, presented with new onset of right shoulder pain swelling and fever.  Patient woke up Monday started to have acute left shoulder pain, throughout the day left shoulder started to swell up.  He went to urgent care, x-ray negative and Covid test negative.  And patient sent home. Next day, patient spiked fever of 104, he took some Tylenol and took cold shower and fever came down to 99.  He kept spiking fever in the next 3 days, T-max 105, but was able to bring down with Cozaar and Tylenol.  Today he went to sports medicine, who sent him to [name] ED.  As per orthopedic surgery's request, ED physician performed a arthrocentesis of the left shoulder with no yield and patient admitted to [name] for further work-up. ED Course: Spiking fever of 101.5, WBC 17, ESR more than 100, broad-spectrum antibiotics started in ED.  Review of Systems: As per HPI otherwise 14 point review of systems negative.    Past Medical History:  Diagnosis Date  . Diabetes mellitus    takes Janumet and Invokana daily  . Hernia, umbilical   . Hypertension    takes Lisinopril daily  . Plaque psoriasis   . Seasonal allergies    takes Claritin daily and uses Flonase daily    Past Surgical History:  Procedure Laterality Date  . INSERTION OF MESH N/A 05/07/2016   Procedure: INSERTION OF MESH;  Surgeon: Erroll Luna, MD;  Location: [name];  Service: General;  Laterality: N/A;  . TYMPANOSTOMY TUBE PLACEMENT    . VENTRAL HERNIA REPAIR N/A  05/07/2016   Procedure: REPAIR VENTRAL HERNIA;  Surgeon: Erroll Luna, MD;  Location: [name];  Service: General;  Laterality: N/A;     reports that he has never smoked. He has never used smokeless tobacco. He reports current alcohol use. He reports that he does not use drugs.  Allergies  Allergen Reactions  . Penicillins Hives and Rash    Has patient had a PCN reaction causing immediate rash, facial/tongue/throat swelling, SOB or lightheadedness with hypotension: Yes Has patient had a PCN reaction causing severe rash involving mucus membranes or skin necrosis: No Has patient had a PCN reaction that required hospitalization No Has patient had a PCN reaction occurring within the last 10 years: No If all of the above answers are "NO", then may proceed with Cephalosporin use.     Family History  Problem Relation Age of Onset  . Hypertension Mother   . Diverticulosis Mother   . Osteoporosis Mother   . Colon polyps Mother   . Diabetes Father   . Alcohol abuse Father   . Osteoporosis Sister   . Hypertension Sister   . Diabetes Brother   . Hypertension Brother   . Colon polyps Brother   . Hypertension Maternal Grandmother   . Heart disease Maternal Grandfather   . Diabetes Paternal Grandfather   . Diabetes Brother   . Hypertension Brother   . Heart attack Brother   . Osteoporosis Sister   . Colon cancer  Neg Hx   . Esophageal cancer Neg Hx   . Stomach cancer Neg Hx   . Rectal cancer Neg Hx      Prior to Admission medications   Medication Sig Start Date End Date Taking? Authorizing Provider  aspirin EC 81 MG tablet Take 81 mg by mouth daily.    [provider]  atorvastatin (LIPITOR) 20 MG tablet TAKE 1 TABLET (20 MG TOTAL) BY MOUTH DAILY AT 6 PM. 08/02/19   Shade Flood, MD  B Complex-C (B-COMPLEX WITH VITAMIN C) tablet Take 1 tablet by mouth daily.    [provider]  BAYER MICROLET LANCETS lancets TEST UP TO 3 TIMES A DAY 01/11/18   Shade Flood, MD   blood glucose meter kit and supplies Dispense based on patient and insurance preference. Use up to 3 times daily, uncontrolled diabetes.  Contour Next meter with test strips and lancets. 11/15/17   Shade Flood, MD  CINNAMON PO Take by mouth.    [provider]  CONTOUR NEXT TEST test strip TEST UP TO 3 TIMES A DAY 11/12/19   Shade Flood, MD  Dulaglutide (TRULICITY) 0.75 MG/0.5ML SOPN Inject 0.75 mg into the skin once a week. E11.65 11/27/19   Shamleffer, Konrad Dolores, MD  fluticasone (FLONASE) 50 MCG/ACT nasal spray PLACE 1-2 SPRAYS INTO BOTH NOSTRILS DAILY. 05/10/19   Shade Flood, MD  gabapentin (NEURONTIN) 300 MG capsule Take 1 capsule (300 mg total) by mouth at bedtime. 02/23/19   Shade Flood, MD  insulin aspart (NOVOLOG FLEXPEN) 100 UNIT/ML FlexPen Inject 12 Units into the skin 3 (three) times daily with meals. Max daily 42 units 04/05/19   Shamleffer, Konrad Dolores, MD  Insulin Pen Needle 32G X 4 MM MISC 4x daily 04/05/19   Shamleffer, Konrad Dolores, MD  LANTUS SOLOSTAR 100 UNIT/ML Solostar Pen INJECT 50 UNITS INTO THE SKIN DAILY. 11/30/19   Shamleffer, Konrad Dolores, MD  lisinopril (PRINIVIL,ZESTRIL) 10 MG tablet Take 1 tablet (10 mg total) by mouth daily. 10/06/18   Shade Flood, MD  loratadine (CLARITIN) 10 MG tablet Take 10 mg by mouth daily.    [provider]  metFORMIN (GLUCOPHAGE) 1000 MG tablet TAKE 1 TABLET (1,000 MG TOTAL) BY MOUTH 2 (TWO) TIMES DAILY WITH A MEAL. 07/27/19   Shade Flood, MD  Multiple Vitamin (MULTIVITAMIN WITH MINERALS) TABS tablet Take 1 tablet by mouth daily.    [provider]  TURMERIC PO Take by mouth.    [provider]  vitamin B-12 (CYANOCOBALAMIN) 1000 MCG tablet Take 1,000 mcg by mouth daily.    [provider]  vitamin C (ASCORBIC ACID) 500 MG tablet Take 500 mg by mouth daily.    [provider]    Physical Exam: Vitals:   11/15/20 1550 11/15/20 1645 11/15/20 1730  11/15/20 1851  BP: (!) 143/89  137/90 128/81  Pulse: (!) 111 (!) 110 (!) 107 (!) 102  Resp: 12 (!) 21 (!) 25 20  Temp:    98.9 F (37.2 C)  TempSrc:    Oral  SpO2: 93% (!) 89% 97% 95%  Weight:      Height:        Constitutional: NAD, calm, comfortable Vitals:   11/15/20 1550 11/15/20 1645 11/15/20 1730 11/15/20 1851  BP: (!) 143/89  137/90 128/81  Pulse: (!) 111 (!) 110 (!) 107 (!) 102  Resp: 12 (!) 21 (!) 25 20  Temp:    98.9 F (37.2 C)  TempSrc:    Oral  SpO2: 93% (!) 89% 97% 95%  Weight:      Height:       Eyes: PERRL, lids and conjunctivae normal ENMT: Mucous membranes are dryt. Posterior pharynx clear of any exudate or lesions.Normal dentition.  Neck: normal, supple, no masses, no thyromegaly Respiratory: clear to auscultation bilaterally, no wheezing, no crackles. Normal respiratory effort. No accessory muscle use.  Cardiovascular: Regular rate and rhythm, no murmurs / rubs / gallops. No extremity edema. 2+ pedal pulses. No carotid bruits.  Abdomen: no tenderness, no masses palpated. No hepatosplenomegaly. Bowel sounds positive.  Musculoskeletal: Swelling of the left shoulder, severe tenderness to touch, significant reduction of ROM, no open wound found Skin: no rashes, lesions, ulcers. No induration Neurologic: CN 2-12 grossly intact. Sensation intact, DTR normal. Strength 5/5 in all 4.  Psychiatric: Normal judgment and insight. Alert and oriented x 3. Normal mood.     Labs on Admission: I have personally reviewed following labs and imaging studies  CBC: Recent Labs  Lab 11/15/20 1326  WBC 17.4*  NEUTROABS 14.5*  HGB 13.5  HCT 40.4  MCV 88.2  PLT 528*   Basic Metabolic Panel: Recent Labs  Lab 11/15/20 1326  NA 132*  K 4.1  CL 99  CO2 19*  GLUCOSE 236*  BUN 19  CREATININE 0.90  CALCIUM 8.6*   GFR: Estimated Creatinine Clearance: 95.7 mL/min (by C-G formula based on SCr of 0.9 mg/dL). Liver Function Tests: Recent Labs  Lab 11/15/20 1326  AST  43*  ALT 43  ALKPHOS 179*  BILITOT 1.1  PROT 7.4  ALBUMIN 3.0*   No results for input(s): LIPASE, AMYLASE in the last 168 hours. No results for input(s): AMMONIA in the last 168 hours. Coagulation Profile: Recent Labs  Lab 11/15/20 1326  INR 1.0   Cardiac Enzymes: No results for input(s): CKTOTAL, CKMB, CKMBINDEX, TROPONINI in the last 168 hours. BNP (last 3 results) No results for input(s): PROBNP in the last 8760 hours. HbA1C: No results for input(s): HGBA1C in the last 72 hours. CBG: No results for input(s): GLUCAP in the last 168 hours. Lipid Profile: No results for input(s): CHOL, HDL, LDLCALC, TRIG, CHOLHDL, LDLDIRECT in the last 72 hours. Thyroid Function Tests: No results for input(s): TSH, T4TOTAL, FREET4, T3FREE, THYROIDAB in the last 72 hours. Anemia Panel: No results for input(s): VITAMINB12, FOLATE, FERRITIN, TIBC, IRON, RETICCTPCT in the last 72 hours. Urine analysis:    Component Value Date/Time   COLORURINE YELLOW 11/15/2020 1412   APPEARANCEUR CLEAR 11/15/2020 1412   LABSPEC <1.005 (L) 11/15/2020 1412   PHURINE 5.0 11/15/2020 1412   GLUCOSEU >=500 (A) 11/15/2020 1412   HGBUR NEGATIVE 11/15/2020 1412   BILIRUBINUR NEGATIVE 11/15/2020 1412   BILIRUBINUR negative 01/01/2017 0900   BILIRUBINUR neg 06/21/2013 1702   KETONESUR >80 (A) 11/15/2020 1412   PROTEINUR NEGATIVE 11/15/2020 1412   UROBILINOGEN 0.2 01/01/2017 0900   NITRITE NEGATIVE 11/15/2020 1412   LEUKOCYTESUR NEGATIVE 11/15/2020 1412    Radiological Exams on Admission: DG Chest Port 1 View  Result Date: 11/15/2020 CLINICAL DATA:  Sepsis. EXAM: PORTABLE CHEST 1 VIEW COMPARISON:  07/05/2017 FINDINGS: The cardiac silhouette, mediastinal and hilar contours are within normal limits. Low lung volumes with vascular crowding and streaky subsegmental bibasilar atelectasis. No infiltrates or effusions. The bony thorax is intact. IMPRESSION: Low lung volumes with vascular crowding and streaky subsegmental  bibasilar atelectasis. Electronically Signed   By: Marijo Sanes M.D.   On: 11/15/2020 13:48  EKG: Independently reviewed.  Sinus tachycardia  Assessment/Plan Active Problems:   Sepsis ([place])  (please populate well all problems here in Problem List. (For example, if patient is on BP meds at home and you resume or decide to hold them, it is a problem that needs to be her. Same for CAD, COPD, HLD and so on)  Sepsis secondary to left shoulder septic arthritis likely -Sepsis evidenced by fever, leukocytosis, with likely source of left shoulder septic arthritis. -MRI as per orthopedic surgery.  Depends on the MRI result, orthopedic surgery request IR guided thoracentesis -Continue vancomycin and cefepime. -Clinically patient still has signs of dehydration and hypovolemia, continue maintenance IV fluids for today.  IDDM -Resume home insulin regimen and add sliding scale.  HTN -Hold home BP meds for today.  DM neuropathy -Continue gabapentin  HLD -Statin  DVT prophylaxis: Lovenox Code Status: Full code Family Communication: None at bedside Disposition Plan: Likely will need more than 2 [name] stay, may need OR for washout.  We will need long-term antibiotics Consults called: Orthopedic surgery Admission status: Tele admit  Lequita Halt MD [facility] Pager [phone]  11/15/2020, 7:10 PM

## 2020-11-15 NOTE — ED Triage Notes (Signed)
Pt c/o left shoulder pain x 4 days-sent from ortho-pt NAD-steady gait

## 2020-11-15 NOTE — ED Provider Notes (Addendum)
[facility] EMERGENCY DEPARTMENT Provider Note   CSN: [id number] Arrival date & time: 11/15/20  1204     History Chief Complaint  Patient presents with  . Shoulder Pain    Logan [name] is a 50 y.o. male.  HPI Patient is a 50 year old male with a history of hypertension, diabetes mellitus, who presents the emergency department due to left shoulder pain.  Atraumatic.  Patient states his symptoms started 4 days ago.  They have been progressively worsening.  He reports associated dry mouth as well as waxing and waning fevers.  He states his T-max was 14 F last night.  He has been taking Tylenol intermittently but has not taken any Tylenol for about 10 to 12 hours.  Denies any chest pain, shortness of breath, URI symptoms, abdominal pain, nausea, vomiting, dysuria, penile discharge.  Patient denies any sexual activity for more than 3 years.  He had a hamburger and Gatorade before he came to the emergency department.    Past Medical History:  Diagnosis Date  . Diabetes mellitus    takes Janumet and Invokana daily  . Hernia, umbilical   . Hypertension    takes Lisinopril daily  . Plaque psoriasis   . Seasonal allergies    takes Claritin daily and uses Flonase daily    Patient Active Problem List   Diagnosis Date Noted  . Type 2 diabetes mellitus with diabetic polyneuropathy, with long-term current use of insulin ([place]) 07/05/2019  . Acute sinusitis 07/06/2017  . Asthmatic bronchitis 07/06/2017  . Allergic rhinitis with a nonallergic component 12/08/2016  . Psoriasis 12/08/2016  . Diabetes ([place]) 05/31/2012  . Hypertension 05/31/2012    Past Surgical History:  Procedure Laterality Date  . INSERTION OF MESH N/A 05/07/2016   Procedure: INSERTION OF MESH;  Surgeon: Erroll Luna, MD;  Location: [facility];  Service: General;  Laterality: N/A;  . TYMPANOSTOMY TUBE PLACEMENT    . VENTRAL HERNIA REPAIR N/A 05/07/2016   Procedure: REPAIR VENTRAL HERNIA;  Surgeon: Erroll Luna, MD;  Location: [facility];  Service: General;  Laterality: N/A;       Family History  Problem Relation Age of Onset  . Hypertension Mother   . Diverticulosis Mother   . Osteoporosis Mother   . Colon polyps Mother   . Diabetes Father   . Alcohol abuse Father   . Osteoporosis Sister   . Hypertension Sister   . Diabetes Brother   . Hypertension Brother   . Colon polyps Brother   . Hypertension Maternal Grandmother   . Heart disease Maternal Grandfather   . Diabetes Paternal Grandfather   . Diabetes Brother   . Hypertension Brother   . Heart attack Brother   . Osteoporosis Sister   . Colon cancer Neg Hx   . Esophageal cancer Neg Hx   . Stomach cancer Neg Hx   . Rectal cancer Neg Hx     Social History   Tobacco Use  . Smoking status: Never Smoker  . Smokeless tobacco: Never Used  Vaping Use  . Vaping Use: Never used  Substance Use Topics  . Alcohol use: Yes    Comment: occ  . Drug use: No    Home Medications Prior to Admission medications   Medication Sig Start Date End Date Taking? Authorizing Provider  aspirin EC 81 MG tablet Take 81 mg by mouth daily.    [provider]  atorvastatin (LIPITOR) 20 MG tablet TAKE 1 TABLET (20 MG TOTAL) BY MOUTH DAILY  AT 6 PM. 08/02/19   [name], MD  B Complex-C (B-COMPLEX WITH VITAMIN C) tablet Take 1 tablet by mouth daily.    [provider]  BAYER MICROLET LANCETS lancets TEST UP TO 3 TIMES A DAY 01/11/18   [name], MD  blood glucose meter kit and supplies Dispense based on patient and insurance preference. Use up to 3 times daily, uncontrolled diabetes.  Contour Next meter with test strips and lancets. 11/15/17   [name], MD  CINNAMON PO Take by mouth.    [provider]  CONTOUR NEXT TEST test strip TEST UP TO 3 TIMES A DAY 11/12/19   [name], MD  Dulaglutide (TRULICITY) 6.50 PT/4.6FK SOPN Inject 0.75 mg into the skin once a week. E11.65 11/27/19   Shamleffer,  Melanie Crazier, MD  fluticasone (FLONASE) 50 MCG/ACT nasal spray PLACE 1-2 SPRAYS INTO BOTH NOSTRILS DAILY. 05/10/19   [name], MD  gabapentin (NEURONTIN) 300 MG capsule Take 1 capsule (300 mg total) by mouth at bedtime. 02/23/19   [name], MD  insulin aspart (NOVOLOG FLEXPEN) 100 UNIT/ML FlexPen Inject 12 Units into the skin 3 (three) times daily with meals. Max daily 42 units 04/05/19   Shamleffer, Melanie Crazier, MD  Insulin Pen Needle 32G X 4 MM MISC 4x daily 04/05/19   Shamleffer, Melanie Crazier, MD  LANTUS SOLOSTAR 100 UNIT/ML Solostar Pen INJECT 50 UNITS INTO THE SKIN DAILY. 11/30/19   Shamleffer, Melanie Crazier, MD  lisinopril (PRINIVIL,ZESTRIL) 10 MG tablet Take 1 tablet (10 mg total) by mouth daily. 10/06/18   [name], MD  loratadine (CLARITIN) 10 MG tablet Take 10 mg by mouth daily.    [provider]  metFORMIN (GLUCOPHAGE) 1000 MG tablet TAKE 1 TABLET (1,000 MG TOTAL) BY MOUTH 2 (TWO) TIMES DAILY WITH A MEAL. 07/27/19   [name], MD  Multiple Vitamin (MULTIVITAMIN WITH MINERALS) TABS tablet Take 1 tablet by mouth daily.    [provider]  TURMERIC PO Take by mouth.    [provider]  vitamin B-12 (CYANOCOBALAMIN) 1000 MCG tablet Take 1,000 mcg by mouth daily.    [provider]  vitamin C (ASCORBIC ACID) 500 MG tablet Take 500 mg by mouth daily.    [provider]    Allergies    Penicillins  Review of Systems   Review of Systems  All other systems reviewed and are negative. Ten systems reviewed and are negative for acute change, except as noted in the HPI.   Physical Exam Updated Vital Signs BP (!) 147/93   Pulse (!) 109   Temp 99.6 F (37.6 C) (Oral)   Resp 12   Ht $R'5\' 5"'[place]$  (1.651 m)   Wt 82.1 kg   SpO2 92%   BMI 30.12 kg/m   Physical Exam Vitals and nursing note reviewed.  Constitutional:      General: He is in acute distress.     Appearance: Normal appearance. He is ill-appearing.  He is not toxic-appearing or diaphoretic.  HENT:     Head: Normocephalic and atraumatic.     Right Ear: External ear normal.     Left Ear: External ear normal.     Nose: Nose normal.     Mouth/Throat:     Mouth: Mucous membranes are moist.     Pharynx: Oropharynx is clear. No oropharyngeal exudate or posterior oropharyngeal erythema.  Eyes:     General: No scleral icterus.  Right eye: No discharge.        Left eye: No discharge.     Extraocular Movements: Extraocular movements intact.     Conjunctiva/sclera: Conjunctivae normal.  Cardiovascular:     Rate and Rhythm: Regular rhythm. Tachycardia present.     Pulses: Normal pulses.     Heart sounds: Normal heart sounds. No murmur heard. No friction rub. No gallop.   Pulmonary:     Effort: Pulmonary effort is normal. No respiratory distress.     Breath sounds: Normal breath sounds. No stridor. No wheezing, rhonchi or rales.  Abdominal:     General: Abdomen is flat.     Palpations: Abdomen is soft.     Tenderness: There is no abdominal tenderness.     Comments: Protuberant abdomen that is soft and nontender.  Musculoskeletal:        General: Tenderness present. No swelling. Normal range of motion.     Cervical back: Normal range of motion and neck supple. No tenderness.     Comments: Exquisite tenderness noted circumferentially around the left shoulder.  Unable to assess range of motion due to patient's pain.  Full range of motion at the left elbow and wrist.  No swelling noted overlying the shoulder.  There is a small amount of erythema developing over the left upper arm, particularly over the bicep.  Skin:    General: Skin is warm and dry.     Findings: Erythema present.  Neurological:     General: No focal deficit present.     Mental Status: He is alert and oriented to person, place, and time.  Psychiatric:        Mood and Affect: Mood normal.        Behavior: Behavior normal.     ED Results / Procedures / Treatments    Labs (all labs ordered are listed, but only abnormal results are displayed) Labs Reviewed  COMPREHENSIVE METABOLIC PANEL - Abnormal; Notable for the following components:      Result Value   Sodium 132 (*)    CO2 19 (*)    Glucose, Bld 236 (*)    Calcium 8.6 (*)    Albumin 3.0 (*)    AST 43 (*)    Alkaline Phosphatase 179 (*)    All other components within normal limits  CBC WITH DIFFERENTIAL/PLATELET - Abnormal; Notable for the following components:   WBC 17.4 (*)    Platelets 444 (*)    Neutro Abs 14.5 (*)    Monocytes Absolute 1.5 (*)    Abs Immature Granulocytes 0.14 (*)    All other components within normal limits  URINALYSIS, ROUTINE W REFLEX MICROSCOPIC - Abnormal; Notable for the following components:   Specific Gravity, Urine <1.005 (*)    Glucose, UA >=500 (*)    Ketones, ur >80 (*)    All other components within normal limits  SEDIMENTATION RATE - Abnormal; Notable for the following components:   Sed Rate 104 (*)    All other components within normal limits  RESP PANEL BY RT-PCR (FLU A&B, COVID) ARPGX2  URINE CULTURE  CULTURE, BLOOD (ROUTINE X 2)  CULTURE, BLOOD (ROUTINE X 2)  BODY FLUID CULTURE  GRAM STAIN  LACTIC ACID, PLASMA  PROTIME-INR  APTT  URINALYSIS, MICROSCOPIC (REFLEX)  LACTIC ACID, PLASMA  C-REACTIVE PROTEIN  GLUCOSE, BODY FLUID OTHER  PROTEIN, BODY FLUID (OTHER)  SYNOVIAL CELL COUNT + DIFF, W/ CRYSTALS    EKG EKG Interpretation  Date/Time:  Friday November 15 2020  12:50:09 EDT Ventricular Rate:  124 PR Interval:  142 QRS Duration: 89 QT Interval:  305 QTC Calculation: 438 R Axis:   60 Text Interpretation: Sinus tachycardia no acute st/ts Confirmed by Aletta Edouard [phone]) on 11/15/2020 12:54:03 PM   Radiology DG Chest Port 1 View  Result Date: 11/15/2020 CLINICAL DATA:  Sepsis. EXAM: PORTABLE CHEST 1 VIEW COMPARISON:  07/05/2017 FINDINGS: The cardiac silhouette, mediastinal and hilar contours are within normal limits. Low  lung volumes with vascular crowding and streaky subsegmental bibasilar atelectasis. No infiltrates or effusions. The bony thorax is intact. IMPRESSION: Low lung volumes with vascular crowding and streaky subsegmental bibasilar atelectasis. Electronically Signed   By: Marijo Sanes M.D.   On: 11/15/2020 13:48   Procedures .Critical Care Performed by: Rayna Sexton, PA-C Authorized by: Rayna Sexton, PA-C   Critical care provider statement:    Critical care time (minutes):  45   Critical care was necessary to treat or prevent imminent or life-threatening deterioration of the following conditions:  Sepsis   Critical care was time spent personally by me on the following activities:  Discussions with consultants, evaluation of patient's response to treatment, examination of patient, ordering and performing treatments and interventions, ordering and review of laboratory studies, ordering and review of radiographic studies, pulse oximetry, re-evaluation of patient's condition, obtaining history from patient or surrogate and review of old charts     Medications Ordered in ED Medications  ceFEPIme (MAXIPIME) 2 g in sodium chloride 0.9 % 100 mL IVPB (has no administration in time range)  metroNIDAZOLE (FLAGYL) IVPB 500 mg (has no administration in time range)  vancomycin (VANCOCIN) IVPB 1000 mg/200 mL premix (has no administration in time range)  lactated ringers bolus 1,000 mL (0 mLs Intravenous Stopped 11/15/20 1453)  acetaminophen (TYLENOL) tablet 1,000 mg (1,000 mg Oral Given 11/15/20 1339)  HYDROmorphone (DILAUDID) injection 1 mg (1 mg Intravenous Given 11/15/20 1339)  lactated ringers bolus 1,250 mL (1,250 mLs Intravenous New Bag/Given 11/15/20 1511)  lidocaine-EPINEPHrine (XYLOCAINE W/EPI) 2 %-1:200000 (PF) injection 20 mL (20 mLs Infiltration Given 11/15/20 1511)    ED Course  I have reviewed the triage vital signs and the nursing notes.  Pertinent labs & imaging results that were available  during my care of the patient were reviewed by me and considered in my medical decision making (see chart for details).  Clinical Course as of 11/15/20 1553  Fri Nov 16, 4658  4167 50 year old male diabetic here with 1 week of atraumatic shoulder pain.  He has had negative outpatient films.  He saw someone in orthopedic clinic today who recommended he come to the emergency department for concerns of septic joint.  He is tachycardic and febrile.  Getting sepsis work-up and discussion with orthopedics regarding management.  Will need admission. [MB]  1320 Patient discussed with Silvestre Gunner, PA-C with orthopedics.  He states that patient will need a medicine admission and transfer to [facility] so that he can be evaluated by Dr. Doreatha Martin.  Request that we perform diagnostic arthrocentesis of the left shoulder if possible.  We can then start broad-spectrum antibiotic coverage after this is performed.  They recommend vancomycin/Zosyn or vancomycin/cefepime.  If we cannot perform arthrocentesis, or if unsuccessful, they recommend just starting antibiotics prior to transfer. [LJ]  1407 WBC(!): 17.4 [LJ]  1451 NEUT#(!): 14.5 [LJ]  1528 Sed Rate(!): 104 [LJ]    Clinical Course User Index [LJ] Rayna Sexton, PA-C [MB] Hayden Rasmussen, MD   MDM Rules/Calculators/A&P  Pt is a 50 y.o. male who presents the emergency department with a possible septic left shoulder.  Labs: CBC with a white count of 17.4, platelets of 444, neutrophils of 14.5, monocytes of 1.5. CMP with a sodium of 132, CO2 of 19, glucose of 236, calcium of 8.6, AST of 43, alk phos of 179. Lactic acid 1.6. CRP pending. ESR pending. UA shows low specific gravity, greater than 500 glucosuria, greater than 80 ketones. Respiratory panel is negative.  Imaging: Chest x-ray shows low lung volumes with vascular crowding and streaky subsegmental bibasilar atelectasis.  I, Rayna Sexton, PA-C, personally  reviewed and evaluated these images and lab results as part of my medical decision-making.  Patient appears to be septic.  He was given Dilaudid for pain, Tylenol for his fever, and fluid bolus with lactated Ringer's.  Appears to be stable.  Heart rate has improved to about 110 bpm.  Fever has resolved with a current temperature of 99.6 F.  I spoke to Silvestre Gunner, PA-C with orthopedics.  Requested that we perform an arthrocentesis of the left shoulder.  This was performed but we were unable to produce any fluid from the left shoulder.  Will start patient on broad-spectrum antibiotics, admit to medicine, and transfer to [facility] for further evaluation by orthopedics.  This was discussed with the patient and he is amenable.  His respiratory panel is negative.  Note: Portions of this report may have been transcribed using voice recognition software. Every effort was made to ensure accuracy; however, inadvertent computerized transcription errors may be present.   Final Clinical Impression(s) / ED Diagnoses Final diagnoses:  Sepsis, due to unspecified organism, unspecified whether acute organ dysfunction present [facility])  Acute pain of left shoulder   Rx / DC Orders ED Discharge Orders    None       Rayna Sexton, PA-C 11/15/20 1533    Rayna Sexton, PA-C 11/15/20 1616    Hayden Rasmussen, MD 11/15/20 [phone]

## 2020-11-15 NOTE — ED Notes (Signed)
Pt on cardiac monitor and auto VS 

## 2020-11-16 ENCOUNTER — Inpatient Hospital Stay (HOSPITAL_COMMUNITY): Payer: Commercial Managed Care - PPO

## 2020-11-16 LAB — BASIC METABOLIC PANEL
Anion gap: 8 (ref 5–15)
BUN: 19 mg/dL (ref 6–20)
CO2: 20 mmol/L — ABNORMAL LOW (ref 22–32)
Calcium: 8.2 mg/dL — ABNORMAL LOW (ref 8.9–10.3)
Chloride: 105 mmol/L (ref 98–111)
Creatinine, Ser: 0.95 mg/dL (ref 0.61–1.24)
GFR, Estimated: >60 mL/min (ref >60)
Glucose, Bld: 120 mg/dL — ABNORMAL HIGH (ref 70–99)
Potassium: 3.8 mmol/L (ref 3.5–5.1)
Sodium: 133 mmol/L — ABNORMAL LOW (ref 135–145)

## 2020-11-16 LAB — CBC
HCT: 37.8 % — ABNORMAL LOW (ref 39.0–52.0)
Hemoglobin: 12.4 g/dL — ABNORMAL LOW (ref 13.0–17.0)
MCH: 29.3 pg (ref 26.0–34.0)
MCHC: 32.8 g/dL (ref 30.0–36.0)
MCV: 89.4 fL (ref 80.0–100.0)
Platelets: 446 10*3/uL — ABNORMAL HIGH (ref 150–400)
RBC: 4.23 MIL/uL (ref 4.22–5.81)
RDW: 14 % (ref 11.5–15.5)
WBC: 15.1 10*3/uL — ABNORMAL HIGH (ref 4.0–10.5)
nRBC: 0 % (ref 0.0–0.2)

## 2020-11-16 LAB — GLUCOSE, CAPILLARY
Glucose-Capillary: 97 mg/dL (ref 70–99)
Glucose-Capillary: 125 mg/dL — ABNORMAL HIGH (ref 70–99)
Glucose-Capillary: 162 mg/dL — ABNORMAL HIGH (ref 70–99)
Glucose-Capillary: 151 mg/dL — ABNORMAL HIGH (ref 70–99)

## 2020-11-16 LAB — URINE CULTURE: Culture: <10000 — AB

## 2020-11-16 LAB — HEMOGLOBIN A1C
Hgb A1c MFr Bld: 8.3 % — ABNORMAL HIGH (ref 4.8–5.6)
Mean Plasma Glucose: 191.51 mg/dL

## 2020-11-16 LAB — HIV ANTIBODY (ROUTINE TESTING W REFLEX): HIV Screen 4th Generation wRfx: NONREACTIVE

## 2020-11-16 MED ORDER — HYDROMORPHONE HCL 1 MG/ML IJ SOLN
1.0000 mg | Freq: Once | INTRAMUSCULAR | Status: AC
  Administered 2020-11-16: 1 mg via INTRAVENOUS
  Filled 2020-11-16: qty 1

## 2020-11-16 MED ORDER — SENNOSIDES-DOCUSATE SODIUM 8.6-50 MG PO TABS
1.0000 | ORAL_TABLET | Freq: Every evening | ORAL | Status: DC | PRN
  Administered 2020-11-16: 1 via ORAL
  Filled 2020-11-16: qty 1

## 2020-11-16 NOTE — Plan of Care (Signed)
Care Plan added

## 2020-11-16 NOTE — Plan of Care (Signed)
  Problem: Pain Managment: Goal: General experience of comfort will improve Outcome: Progressing   

## 2020-11-16 NOTE — Consult Note (Signed)
Orthopaedic Trauma Service (OTS) Consult   Patient ID: Logan [name] MRN: [id number] DOB/AGE: 09/18/1970 50 y.o.  Reason for Consult: Left shoulder pain Referring Physician: Rayna Sexton, PA-C ([facility])  HPI: Logan [name] is an 50 y.o. male being seen in consultation at the request of PA Joldersma for evaluation of left shoulder pain.  Patient presented to [facility] on 11/15/2020 with complaint of atraumatic left shoulder pain x1 week.  He had seen an orthopedist as an outpatient and x-rays of the shoulder were negative, but due to concern for septic joint patient was instructed to be seen in the emergency department.  He was tachycardic and febrile upon arrival.  Orthopedics was consulted for evaluation and management, who recommended arthrocentesis of the  left shoulder be done.  Arthrocentesis was completed with no yield.  Patient was transferred to [facility] for hospitalist admission, was started on broad-spectrum antibiotics.  Patient seen this morning on 5N.  Notes the pain is significantly improved from yesterday after being started on antibiotics.  Is currently afebrile.  Is awaiting MRI of his left shoulder.  Patient denies any previous injury or surgery to his left upper extremity.  Patient does have a history of diabetes which he is on several medications for, he has been working hard to try to get his diabetes under better control over the last year.  Hemoglobin A1c on admission was 8.3, which is down from 10.6 last year and 11.1 the previous year.  Past Medical History:  Diagnosis Date  . Diabetes mellitus    takes Janumet and Invokana daily  . Hernia, umbilical   . Hypertension    takes Lisinopril daily  . Plaque psoriasis   . Seasonal allergies    takes Claritin daily and uses Flonase daily    Past Surgical History:  Procedure Laterality Date  . INSERTION OF MESH N/A 05/07/2016   Procedure: INSERTION OF MESH;  Surgeon: Erroll Luna, MD;  Location: [facility];  Service: General;  Laterality: N/A;  . TYMPANOSTOMY TUBE PLACEMENT    . VENTRAL HERNIA REPAIR N/A 05/07/2016   Procedure: REPAIR VENTRAL HERNIA;  Surgeon: Erroll Luna, MD;  Location: [facility];  Service: General;  Laterality: N/A;    Family History  Problem Relation Age of Onset  . Hypertension Mother   . Diverticulosis Mother   . Osteoporosis Mother   . Colon polyps Mother   . Diabetes Father   . Alcohol abuse Father   . Osteoporosis Sister   . Hypertension Sister   . Diabetes Brother   . Hypertension Brother   . Colon polyps Brother   . Hypertension Maternal Grandmother   . Heart disease Maternal Grandfather   . Diabetes Paternal Grandfather   . Diabetes Brother   . Hypertension Brother   . Heart attack Brother   . Osteoporosis Sister   . Colon cancer Neg Hx   . Esophageal cancer Neg Hx   . Stomach cancer Neg Hx   . Rectal cancer Neg Hx     Social History:  reports that he has never smoked. He has never used smokeless tobacco. He reports current alcohol use. He reports that he does not use drugs.  Allergies:  Allergies  Allergen Reactions  . Penicillins Hives and Rash    Has patient had a PCN reaction causing immediate rash, facial/tongue/throat swelling, SOB or lightheadedness with hypotension: Yes Has patient had a PCN reaction causing severe rash involving mucus membranes or skin necrosis: No Has  patient had a PCN reaction that required hospitalization No Has patient had a PCN reaction occurring within the last 10 years: No If all of the above answers are "NO", then may proceed with Cephalosporin use.     Medications:  I have reviewed the patient's current medications. Prior to Admission:  Medications Prior to Admission  Medication Sig Dispense Refill Last Dose  . aspirin EC 81 MG tablet Take 81 mg by mouth every morning.   11/15/2020 at am  . atorvastatin (LIPITOR) 20 MG tablet TAKE 1 TABLET (20 MG TOTAL) BY MOUTH DAILY AT 6 PM. 90 tablet 1 11/14/2020 at pm   . CHROMIUM-CINNAMON PO Take 1 tablet by mouth 2 (two) times daily.   11/15/2020 at am  . Clobetasol Propionate 0.05 % lotion Apply 1 application topically daily as needed (psoriasis).   11/14/2020 at Unknown time  . cyclobenzaprine (FLEXERIL) 5 MG tablet Take 5 mg by mouth 3 (three) times daily.   11/15/2020 at am  . Dulaglutide (TRULICITY) 3 XB/2.8UX SOPN Inject 3 mg into the skin every Monday.   11/11/2020  . empagliflozin (JARDIANCE) 25 MG TABS tablet Take 25 mg by mouth every morning.   11/15/2020 at am  . fluticasone (FLONASE) 50 MCG/ACT nasal spray PLACE 1-2 SPRAYS INTO BOTH NOSTRILS DAILY. (Patient taking differently: Place 1-2 sprays into both nostrils daily as needed for allergies or rhinitis.) 48 mL 2 Past Week at Unknown time  . gabapentin (NEURONTIN) 300 MG capsule Take 1 capsule (300 mg total) by mouth at bedtime. 30 capsule 1 11/14/2020 at pm  . lisinopril (PRINIVIL,ZESTRIL) 10 MG tablet Take 1 tablet (10 mg total) by mouth daily. 90 tablet 1 11/15/2020 at am  . loratadine (CLARITIN) 10 MG tablet Take 10 mg by mouth daily as needed for allergies, rhinitis or itching.   Past Week at Unknown time  . meloxicam (MOBIC) 15 MG tablet Take 15 mg by mouth in the morning.   11/15/2020 at am  . metFORMIN (GLUCOPHAGE) 1000 MG tablet TAKE 1 TABLET (1,000 MG TOTAL) BY MOUTH 2 (TWO) TIMES DAILY WITH A MEAL. 180 tablet 1 11/15/2020 at am  . Multiple Vitamin (MULTIVITAMIN WITH MINERALS) TABS tablet Take 1 tablet by mouth every morning. Men's 50 plus   11/15/2020 at am  . BAYER MICROLET LANCETS lancets TEST UP TO 3 TIMES A DAY 100 each 0   . blood glucose meter kit and supplies Dispense based on patient and insurance preference. Use up to 3 times daily, uncontrolled diabetes.  Contour Next meter with test strips and lancets. 1 each 0   . CONTOUR NEXT TEST test strip TEST UP TO 3 TIMES A DAY 100 strip 3   . Dulaglutide (TRULICITY) 3.24 MW/1.0UV SOPN Inject 0.75 mg into the skin once a week. E11.65 (Patient not taking: No  sig reported) 2 pen 0 Not Taking at Unknown time  . insulin aspart (NOVOLOG FLEXPEN) 100 UNIT/ML FlexPen Inject 12 Units into the skin 3 (three) times daily with meals. Max daily 42 units (Patient not taking: No sig reported) 15 mL 11 Not Taking at Unknown time  . Insulin Pen Needle 32G X 4 MM MISC 4x daily 150 each 11   . LANTUS SOLOSTAR 100 UNIT/ML Solostar Pen INJECT 50 UNITS INTO THE SKIN DAILY. (Patient not taking: No sig reported) 15 mL 3 Not Taking at Unknown time    ROS: Constitutional: No fever or chills Vision: No changes in vision ENT: No difficulty swallowing CV: No chest pain Pulm:  No SOB or wheezing GI: No nausea or vomiting GU: No urgency or inability to hold urine Skin: No poor wound healing Neurologic: No numbness or tingling Psychiatric: No depression or anxiety Heme: No bruising Allergic: No reaction to medications or food   Exam: Blood pressure 127/77, pulse 90, temperature 98 F (36.7 C), temperature source Oral, resp. rate 17, height 5\' 5"  (1.651 m), weight 82.1 kg, SpO2 96 %. General: Sitting up in bed, no acute distress Orientation: Alert and oriented x3 Mood and Affect: Mood affect appropriate, pleasant and cooperative Gait: Normal reciprocal gait Coordination and balance: Within normal limits  LUE: Mild edema and tenderness about the left shoulder, per patient's report much improved from yesterday.  Tolerates about 110 degrees of passive forward elevation of the shoulder without significant pain, does endorse some discomfort with return of shoulder to neutral position..  He has full elbow and wrist motion.  Motor and sensory function is intact distally.  He is neurovascularly intact.  RUE: Skin without lesions. No tenderness to palpation. Full painless ROM, full strength in each muscle group without evidence of instability.  Motor and sensory function grossly intact.  Neurovascularly at baseline   Medical Decision Making: Data: Imaging: None  currently  Labs:  Results for orders placed or performed during the hospital encounter of 11/15/20 (from the past 24 hour(s))  Lactic acid, plasma     Status: None   Collection Time: 11/15/20  1:26 PM  Result Value Ref Range   Lactic Acid, Venous 1.6 0.5 - 1.9 mmol/L  Comprehensive metabolic panel     Status: Abnormal   Collection Time: 11/15/20  1:26 PM  Result Value Ref Range   Sodium 132 (L) 135 - 145 mmol/L   Potassium 4.1 3.5 - 5.1 mmol/L   Chloride 99 98 - 111 mmol/L   CO2 19 (L) 22 - 32 mmol/L   Glucose, Bld 236 (H) 70 - 99 mg/dL   BUN 19 6 - 20 mg/dL   Creatinine, Ser 01/15/21 0.61 - 1.24 mg/dL   Calcium 8.6 (L) 8.9 - 10.3 mg/dL   Total Protein 7.4 6.5 - 8.1 g/dL   Albumin 3.0 (L) 3.5 - 5.0 g/dL   AST 43 (H) 15 - 41 U/L   ALT 43 0 - 44 U/L   Alkaline Phosphatase 179 (H) 38 - 126 U/L   Total Bilirubin 1.1 0.3 - 1.2 mg/dL   GFR, Estimated 6.77 >21 mL/min   Anion gap 14 5 - 15  CBC WITH DIFFERENTIAL     Status: Abnormal   Collection Time: 11/15/20  1:26 PM  Result Value Ref Range   WBC 17.4 (H) 4.0 - 10.5 K/uL   RBC 4.58 4.22 - 5.81 MIL/uL   Hemoglobin 13.5 13.0 - 17.0 g/dL   HCT 01/15/21 67.9 - 36.1 %   MCV 88.2 80.0 - 100.0 fL   MCH 29.5 26.0 - 34.0 pg   MCHC 33.4 30.0 - 36.0 g/dL   RDW 09.2 20.7 - 71.4 %   Platelets 444 (H) 150 - 400 K/uL   nRBC 0.0 0.0 - 0.2 %   Neutrophils Relative % 83 %   Neutro Abs 14.5 (H) 1.7 - 7.7 K/uL   Lymphocytes Relative 7 %   Lymphs Abs 1.1 0.7 - 4.0 K/uL   Monocytes Relative 9 %   Monocytes Absolute 1.5 (H) 0.1 - 1.0 K/uL   Eosinophils Relative 0 %   Eosinophils Absolute 0.0 0.0 - 0.5 K/uL   Basophils Relative 0 %  Basophils Absolute 0.1 0.0 - 0.1 K/uL   Immature Granulocytes 1 %   Abs Immature Granulocytes 0.14 (H) 0.00 - 0.07 K/uL  Protime-INR     Status: None   Collection Time: 11/15/20  1:26 PM  Result Value Ref Range   Prothrombin Time 12.4 11.4 - 15.2 seconds   INR 1.0 0.8 - 1.2  APTT     Status: None   Collection Time:  11/15/20  1:26 PM  Result Value Ref Range   aPTT 33 24 - 36 seconds  Sedimentation rate     Status: Abnormal   Collection Time: 11/15/20  1:26 PM  Result Value Ref Range   Sed Rate 104 (H) 0 - 16 mm/hr  C-reactive protein     Status: Abnormal   Collection Time: 11/15/20  1:26 PM  Result Value Ref Range   CRP 20.4 (H) <1.0 mg/dL  Blood Culture (routine x 2)     Status: None (Preliminary result)   Collection Time: 11/15/20  1:36 PM   Specimen: BLOOD RIGHT FOREARM  Result Value Ref Range   Specimen Description      BLOOD RIGHT FOREARM BLOOD Performed at [facility], [address] [place], [place] [address]    Special Requests      Blood Culture results may not be optimal due to an excessive volume of blood received in culture bottles [place] Performed at [facility], [address] [place], [place] [address]    Culture      NO GROWTH < 24 HOURS Performed at [facility], [place] [address], [place], [place] [address]    Report Status PENDING   Blood Culture (routine x 2)     Status: None (Preliminary result)   Collection Time: 11/15/20  1:40 PM   Specimen: BLOOD LEFT HAND  Result Value Ref Range   Specimen Description      BLOOD LEFT HAND BLOOD Performed at [facility], [address] [place], [place] [address]    Special Requests      BOTTLES DRAWN AEROBIC AND ANAEROBIC Blood Culture results may not be optimal due to an excessive volume of blood received in culture bottles Performed at [facility], [address] [place], [place] [address]    Culture      NO GROWTH < 24 HOURS Performed at [facility], [place] [address] [place], [place] [address]    Report Status PENDING   Resp Panel by RT-PCR (Flu A&B, Covid)     Status: None   Collection Time: 11/15/20  1:46 PM   Specimen: Nasopharyngeal(NP) swabs in vial transport medium  Result Value Ref Range   SARS Coronavirus 2 by RT PCR  NEGATIVE NEGATIVE   Influenza A by PCR NEGATIVE NEGATIVE   Influenza B by PCR NEGATIVE NEGATIVE  Urinalysis, Routine w reflex microscopic Urine, Clean Catch     Status: Abnormal   Collection Time: 11/15/20  2:12 PM  Result Value Ref Range   Color, Urine YELLOW YELLOW   APPearance CLEAR CLEAR   Specific Gravity, Urine <1.005 (L) 1.005 - 1.030   pH 5.0 5.0 - 8.0   Glucose, UA >=500 (A) NEGATIVE mg/dL   Hgb urine dipstick NEGATIVE NEGATIVE   Bilirubin Urine NEGATIVE NEGATIVE   Ketones, ur >80 (A) NEGATIVE mg/dL   Protein, ur NEGATIVE NEGATIVE mg/dL   Nitrite NEGATIVE NEGATIVE   Leukocytes,Ua NEGATIVE NEGATIVE  Urinalysis, Microscopic (reflex)     Status: None   Collection Time: 11/15/20  2:12 PM  Result Value Ref Range   RBC / HPF 0-5 0 - 5 RBC/hpf   WBC, UA 0-5 0 - 5 WBC/hpf   Bacteria, UA NONE SEEN NONE SEEN   Squamous Epithelial / LPF 0-5 0 - 5  Urine rapid drug screen (hosp performed)     Status: None   Collection Time: 11/15/20  2:12 PM  Result Value Ref Range   Opiates NONE DETECTED NONE DETECTED   Cocaine NONE DETECTED NONE DETECTED   Benzodiazepines NONE DETECTED NONE DETECTED   Amphetamines NONE DETECTED NONE DETECTED   Tetrahydrocannabinol NONE DETECTED NONE DETECTED   Barbiturates NONE DETECTED NONE DETECTED  Lactic acid, plasma     Status: None   Collection Time: 11/15/20  6:33 PM  Result Value Ref Range   Lactic Acid, Venous 1.1 0.5 - 1.9 mmol/L  HIV Antibody (routine testing w rflx)     Status: None   Collection Time: 11/16/20  1:50 AM  Result Value Ref Range   HIV Screen 4th Generation wRfx Non Reactive Non Reactive  Basic metabolic panel     Status: Abnormal   Collection Time: 11/16/20  1:50 AM  Result Value Ref Range   Sodium 133 (L) 135 - 145 mmol/L   Potassium 3.8 3.5 - 5.1 mmol/L   Chloride 105 98 - 111 mmol/L   CO2 20 (L) 22 - 32 mmol/L   Glucose, Bld 120 (H) 70 - 99 mg/dL   BUN 19 6 - 20 mg/dL   Creatinine, Ser 0.95 0.61 - 1.24 mg/dL   Calcium 8.2  (L) 8.9 - 10.3 mg/dL   GFR, Estimated >60 >60 mL/min   Anion gap 8 5 - 15  CBC     Status: Abnormal   Collection Time: 11/16/20  1:50 AM  Result Value Ref Range   WBC 15.1 (H) 4.0 - 10.5 K/uL   RBC 4.23 4.22 - 5.81 MIL/uL   Hemoglobin 12.4 (L) 13.0 - 17.0 g/dL   HCT 37.8 (L) 39.0 - 52.0 %   MCV 89.4 80.0 - 100.0 fL   MCH 29.3 26.0 - 34.0 pg   MCHC 32.8 30.0 - 36.0 g/dL   RDW 14.0 11.5 - 15.5 %   Platelets 446 (H) 150 - 400 K/uL   nRBC 0.0 0.0 - 0.2 %  Hemoglobin A1c     Status: Abnormal   Collection Time: 11/16/20  1:50 AM  Result Value Ref Range   Hgb A1c MFr Bld 8.3 (H) 4.8 - 5.6 %   Mean Plasma Glucose 191.51 mg/dL  Glucose, capillary     Status: None   Collection Time: 11/16/20 10:07 AM  Result Value Ref Range   Glucose-Capillary 97 70 - 99 mg/dL    Imaging or Labs ordered: XR left shoulder, MRI left shoulder   Assessment/Plan: 50 year old male with left shoulder pain.  MRI of left shoulder to assess if glenohumeral joint has effusion has been ordered and is pending completion.  If effusion is noted, will need IR aspiration.  Recommend continuing on broad-spectrum antibiotics for now.  Orthopaedics will follow up on MRI results and re-evaluate patient tomorrow to determine if any further surgical intervention is warrented.  Destany Severns A. [facility] [phone] (office) [email or web address]

## 2020-11-16 NOTE — Progress Notes (Signed)
PROGRESS NOTE    Logan [name]  AJO:[id number] DOB: September 06, 1970 DOA: 11/15/2020 PCP: Rudene Anda, MD   Chief Complain: Right shoulder pain, fever  Brief Narrative: Patient is a 50 year old male with history of insulin-dependent diabetes mellitus, diabetic neuropathy, hypertension, psoriasis who presented with new onset of right shoulder pain, swelling and fever.  Patient was seen by orthopedics as an outpatient who sent him to [facility] emergency department.  Arthrocentesis of the left shoulder was done in the emergency with no yield and patient was admitted to [facility] for further work-up.  Presentation he was febrile, had leukocytosis, elevated ESR.  Started on broad spectrum  antibiotics for suspicion of septic arthritis of left shoulder.  Orthopedics following.  Assessment & Plan:   Active Problems:   Sepsis ([place])   Acute pain of left shoulder   Sepsis secondary to left shoulder septic arthritis: Presented with fever, leukocytosis.  Patient was having severe left shoulder pain, swelling. Patient was seen by orthopedics as an outpatient who sent him to [facility] emergency department.  Arthrocentesis of the left shoulder was done in the emergency with no yield and patient was admitted to [facility] for further work-up.  Pending MRI.  Continue vancomycin and cefepime.  Continue IV fluids.  Orthopedics following  Insulin-dependent diabetes mellitus: Monitor blood sugars.  Continue sliding scale 7 for now.  Hypertension: Currently blood pressure stable without any medications.  Continue to hold  Diabetic neuropathy: On gabapentin  Hyperlipidemia: On statin         DVT prophylaxis:Lovenox Code Status: Full Family Communication: None at bedside Status is: Inpatient  Remains inpatient appropriate because:Inpatient level of care appropriate due to severity of illness   Dispo: The patient is from: Home              Anticipated d/c is to: Home              Patient currently is not medically  stable to d/c.   Difficult to place patient No   Consultants: Orthopedics  Procedures:None  Antimicrobials:  Anti-infectives (From admission, onward)   Start     Dose/Rate Route Frequency Ordered Stop   11/16/20 0000  vancomycin (VANCOREADY) IVPB 750 mg/150 mL        750 mg 150 mL/hr over 60 Minutes Intravenous Every 12 hours 11/15/20 1538     11/15/20 1600  ceFEPIme (MAXIPIME) 2 g in sodium chloride 0.9 % 100 mL IVPB        2 g 200 mL/hr over 30 Minutes Intravenous Every 8 hours 11/15/20 1532     11/15/20 1600  metroNIDAZOLE (FLAGYL) IVPB 500 mg  Status:  Discontinued        500 mg 100 mL/hr over 60 Minutes Intravenous Every 8 hours 11/15/20 1532 11/16/20 0747   11/15/20 1530  ceFEPIme (MAXIPIME) 2 g in sodium chloride 0.9 % 100 mL IVPB  Status:  Discontinued        2 g 200 mL/hr over 30 Minutes Intravenous  Once 11/15/20 1528 11/15/20 1532   11/15/20 1530  metroNIDAZOLE (FLAGYL) IVPB 500 mg  Status:  Discontinued        500 mg 100 mL/hr over 60 Minutes Intravenous  Once 11/15/20 1528 11/15/20 1532   11/15/20 1530  vancomycin (VANCOCIN) IVPB 1000 mg/200 mL premix        1,000 mg 200 mL/hr over 60 Minutes Intravenous  Once 11/15/20 1528 11/15/20 1656      Subjective: Patient seen and examined the bedside this  morning Afebrile this morning.  Pain is better on the left shoulder.  Objective: Vitals:   11/15/20 1730 11/15/20 1851 11/15/20 2030 11/16/20 0642  BP: 137/90 128/81 124/82 121/79  Pulse: (!) 107 (!) 102 (!) 103 83  Resp: (!) _0 Temp:  98.9 F (37.2 C) 99.2 F (37.3 C) 98.3 F (36.8 C)  TempSrc:  Oral Oral Oral  SpO2: 97% 95% 96% 97%  Weight:      Height:        Intake/Output Summary (Last 24 hours) at 11/16/2020 0747 Last data filed at 11/16/2020 0300 Gross per 24 hour  Intake 3800.06 ml  Output 550 ml  Net 3250.06 ml   Filed Weights   11/15/20 1227  Weight: 82.1 kg    Examination:  General exam: Overall comfortable, not in  distress HEENT: PERRL Respiratory system:  no wheezes or crackles  Cardiovascular system: S1 & S2 heard, RRR.  Gastrointestinal system: Abdomen is nondistended, soft and nontender. Central nervous system: Alert and oriented Extremities: No edema, no clubbing ,no cyanosis, tenderness and mild edema of the left shoulder Skin: No rashes, no ulcers,no icterus      Data Reviewed: I have personally reviewed following labs and imaging studies  CBC: Recent Labs  Lab 11/15/20 1326 11/16/20 0150  WBC 17.4* 15.1*  NEUTROABS 14.5*  --   HGB 13.5 12.4*  HCT 40.4 37.8*  MCV 88.2 89.4  PLT 444* 102*   Basic Metabolic Panel: Recent Labs  Lab 11/15/20 1326 11/16/20 0150  NA 132* 133*  K 4.1 3.8  CL 99 105  CO2 19* 20*  GLUCOSE 236* 120*  BUN 19 19  CREATININE 0.90 0.95  CALCIUM 8.6* 8.2*   GFR: Estimated Creatinine Clearance: 90.7 mL/min (by C-G formula based on SCr of 0.95 mg/dL). Liver Function Tests: Recent Labs  Lab 11/15/20 1326  AST 43*  ALT 43  ALKPHOS 179*  BILITOT 1.1  PROT 7.4  ALBUMIN 3.0*   No results for input(s): LIPASE, AMYLASE in the last 168 hours. No results for input(s): AMMONIA in the last 168 hours. Coagulation Profile: Recent Labs  Lab 11/15/20 1326  INR 1.0   Cardiac Enzymes: No results for input(s): CKTOTAL, CKMB, CKMBINDEX, TROPONINI in the last 168 hours. BNP (last 3 results) No results for input(s): PROBNP in the last 8760 hours. HbA1C: Recent Labs    11/16/20 0150  HGBA1C 8.3*   CBG: No results for input(s): GLUCAP in the last 168 hours. Lipid Profile: No results for input(s): CHOL, HDL, LDLCALC, TRIG, CHOLHDL, LDLDIRECT in the last 72 hours. Thyroid Function Tests: No results for input(s): TSH, T4TOTAL, FREET4, T3FREE, THYROIDAB in the last 72 hours. Anemia Panel: No results for input(s): VITAMINB12, FOLATE, FERRITIN, TIBC, IRON, RETICCTPCT in the last 72 hours. Sepsis Labs: Recent Labs  Lab 11/15/20 1326 11/15/20 1833   LATICACIDVEN 1.6 1.1    Recent Results (from the past 240 hour(s))  Resp Panel by RT-PCR (Flu A&B, Covid)     Status: None   Collection Time: 11/15/20  1:46 PM   Specimen: Nasopharyngeal(NP) swabs in vial transport medium  Result Value Ref Range Status   SARS Coronavirus 2 by RT PCR NEGATIVE NEGATIVE Final    Comment: (NOTE) SARS-CoV-2 target nucleic acids are NOT DETECTED.  The SARS-CoV-2 RNA is generally detectable in upper respiratory specimens during the acute phase of infection. The lowest concentration of SARS-CoV-2 viral copies this assay can detect is 138 copies/mL. A negative result does not preclude  SARS-Cov-2 infection and should not be used as the sole basis for treatment or other patient management decisions. A negative result may occur with  improper specimen collection/handling, submission of specimen other than nasopharyngeal swab, presence of viral mutation(s) within the areas targeted by this assay, and inadequate number of viral copies(<138 copies/mL). A negative result must be combined with clinical observations, patient history, and epidemiological information. The expected result is Negative.  Fact Sheet for Patients:  EntrepreneurPulse.com.au  Fact Sheet for Healthcare Providers:  IncredibleEmployment.be  This test is no t yet approved or cleared by the [place] FDA and  has been authorized for detection and/or diagnosis of SARS-CoV-2 by FDA under an Emergency Use Authorization (EUA). This EUA will remain  in effect (meaning this test can be used) for the duration of the COVID-19 declaration under Section 564(b)(1) of the Act, 21 U.S.C.section 360bbb-3(b)(1), unless the authorization is terminated  or revoked sooner.       Influenza A by PCR NEGATIVE NEGATIVE Final   Influenza B by PCR NEGATIVE NEGATIVE Final    Comment: (NOTE) The Xpert Xpress SARS-CoV-2/FLU/RSV plus assay is intended as an aid in the  diagnosis of influenza from Nasopharyngeal swab specimens and should not be used as a sole basis for treatment. Nasal washings and aspirates are unacceptable for Xpert Xpress SARS-CoV-2/FLU/RSV testing.  Fact Sheet for Patients: EntrepreneurPulse.com.au  Fact Sheet for Healthcare Providers: IncredibleEmployment.be  This test is not yet approved or cleared by the [place] FDA and has been authorized for detection and/or diagnosis of SARS-CoV-2 by FDA under an Emergency Use Authorization (EUA). This EUA will remain in effect (meaning this test can be used) for the duration of the COVID-19 declaration under Section 564(b)(1) of the Act, 21 U.S.C. section 360bbb-3(b)(1), unless the authorization is terminated or revoked.  Performed at [facility], [address], [place], [place] [address]          Radiology Studies: DG Chest [place] 1 View  Result Date: 11/15/2020 CLINICAL DATA:  Sepsis. EXAM: PORTABLE CHEST 1 VIEW COMPARISON:  07/05/2017 FINDINGS: The cardiac silhouette, mediastinal and hilar contours are within normal limits. Low lung volumes with vascular crowding and streaky subsegmental bibasilar atelectasis. No infiltrates or effusions. The bony thorax is intact. IMPRESSION: Low lung volumes with vascular crowding and streaky subsegmental bibasilar atelectasis. Electronically Signed   By: Marijo Sanes M.D.   On: 11/15/2020 13:48        Scheduled Meds: . aspirin EC  81 mg Oral Daily  . atorvastatin  20 mg Oral q1800  . enoxaparin (LOVENOX) injection  40 mg Subcutaneous Q24H  . gabapentin  300 mg Oral QHS  . insulin aspart  0-15 Units Subcutaneous TID WC  . vitamin B-12  1,000 mcg Oral Daily   Continuous Infusions: . sodium chloride 150 mL/hr at 11/15/20 2026  . ceFEPime (MAXIPIME) IV 2 g (11/15/20 2351)  . vancomycin 750 mg (11/16/20 0224)     LOS: 1 day    Time spent: 25 mins.More than 50% of that time was  spent in counseling and/or coordination of care.      Shelly Coss, MD [facility] P4/04/2021, 7:47 AM

## 2020-11-17 ENCOUNTER — Encounter (HOSPITAL_COMMUNITY): Payer: Self-pay | Admitting: Internal Medicine

## 2020-11-17 LAB — CBC WITH DIFFERENTIAL/PLATELET
Abs Immature Granulocytes: 0.22 10*3/uL — ABNORMAL HIGH (ref 0.00–0.07)
Basophils Absolute: 0.1 10*3/uL (ref 0.0–0.1)
Basophils Relative: 1 %
Eosinophils Absolute: 0.3 10*3/uL (ref 0.0–0.5)
Eosinophils Relative: 2 %
HCT: 39.3 % (ref 39.0–52.0)
Hemoglobin: 12.6 g/dL — ABNORMAL LOW (ref 13.0–17.0)
Immature Granulocytes: 1 %
Lymphocytes Relative: 13 %
Lymphs Abs: 1.9 10*3/uL (ref 0.7–4.0)
MCH: 29.8 pg (ref 26.0–34.0)
MCHC: 32.1 g/dL (ref 30.0–36.0)
MCV: 92.9 fL (ref 80.0–100.0)
Monocytes Absolute: 1.5 10*3/uL — ABNORMAL HIGH (ref 0.1–1.0)
Monocytes Relative: 10 %
Neutro Abs: 11.3 10*3/uL — ABNORMAL HIGH (ref 1.7–7.7)
Neutrophils Relative %: 73 %
Platelets: 495 10*3/uL — ABNORMAL HIGH (ref 150–400)
RBC: 4.23 MIL/uL (ref 4.22–5.81)
RDW: 14.1 % (ref 11.5–15.5)
WBC: 15.3 10*3/uL — ABNORMAL HIGH (ref 4.0–10.5)
nRBC: 0 % (ref 0.0–0.2)

## 2020-11-17 LAB — GLUCOSE, CAPILLARY
Glucose-Capillary: 112 mg/dL — ABNORMAL HIGH (ref 70–99)
Glucose-Capillary: 94 mg/dL (ref 70–99)
Glucose-Capillary: 104 mg/dL — ABNORMAL HIGH (ref 70–99)
Glucose-Capillary: 209 mg/dL — ABNORMAL HIGH (ref 70–99)

## 2020-11-17 MED ORDER — MORPHINE SULFATE (PF) 2 MG/ML IV SOLN
2.0000 mg | INTRAVENOUS | Status: DC | PRN
  Administered 2020-11-17 – 2020-11-20 (×5): 2 mg via INTRAVENOUS
  Filled 2020-11-17 (×6): qty 1

## 2020-11-17 MED ORDER — SALINE SPRAY 0.65 % NA SOLN
1.0000 | NASAL | Status: DC | PRN
  Filled 2020-11-17: qty 44

## 2020-11-17 MED ORDER — OXYCODONE HCL 5 MG PO TABS
10.0000 mg | ORAL_TABLET | Freq: Four times a day (QID) | ORAL | Status: DC | PRN
  Administered 2020-11-17 – 2020-11-21 (×12): 10 mg via ORAL
  Filled 2020-11-17 (×12): qty 2

## 2020-11-17 MED ORDER — POLYETHYLENE GLYCOL 3350 17 G PO PACK
17.0000 g | PACK | Freq: Every day | ORAL | Status: DC
  Administered 2020-11-19 – 2020-11-22 (×4): 17 g via ORAL
  Filled 2020-11-17 (×4): qty 1

## 2020-11-17 MED ORDER — MAGNESIUM CITRATE PO SOLN
1.0000 | Freq: Once | ORAL | Status: AC
  Administered 2020-11-17: 1 via ORAL
  Filled 2020-11-17: qty 296

## 2020-11-17 NOTE — Consult Note (Signed)
ORTHOPAEDIC CONSULTATION  REQUESTING PHYSICIAN: Shelly Coss, MD  PCP:  Rudene Anda, MD  Chief Complaint: Left shoulder infection  HPI: Logan [name] is a 50 y.o. male who complains of gradually increasing left shoulder pain over the course of the last few days.  He has a somewhat sedentary job and does not do a lot of lifting.  Denies trauma.  No history of recent fevers or sicknesses.  On work-up here he was noted to have SIRS criteria as well as physical exam findings concerning for septic arthrosis of the left shoulder.  He was admitted to the medicine service for work-up.  MRI was suspicious for a collection of potentially septic fluid in the left glenohumeral joint.  I was contacted by Dr. Doreatha Martin due to my level of comfort with arthroscopic procedures and potential for washout of this left shoulder.  The patient denies any numbness or tingling.  Continues with moderate pain in the shoulder.  Denies smoking.  Does have moderately well-controlled diabetes type 2.  Past Medical History:  Diagnosis Date  . Diabetes mellitus    takes Janumet and Invokana daily  . Hernia, umbilical   . Hypertension    takes Lisinopril daily  . Plaque psoriasis   . Seasonal allergies    takes Claritin daily and uses Flonase daily   Past Surgical History:  Procedure Laterality Date  . INSERTION OF MESH N/A 05/07/2016   Procedure: INSERTION OF MESH;  Surgeon: Erroll Luna, MD;  Location: [facility];  Service: General;  Laterality: N/A;  . TYMPANOSTOMY TUBE PLACEMENT    . VENTRAL HERNIA REPAIR N/A 05/07/2016   Procedure: REPAIR VENTRAL HERNIA;  Surgeon: Erroll Luna, MD;  Location: [facility];  Service: General;  Laterality: N/A;   Social History   Socioeconomic History  . Marital status: Single    Spouse name: Not on file  . Number of children: Not on file  . Years of education: Not on file  . Highest education level: Not on file  Occupational History  . Not on file  Tobacco Use  . Smoking  status: Never Smoker  . Smokeless tobacco: Never Used  Vaping Use  . Vaping Use: Never used  Substance and Sexual Activity  . Alcohol use: Yes    Comment: occ  . Drug use: No  . Sexual activity: Not on file  Other Topics Concern  . Not on file  Social History Narrative  . Not on file   Social Determinants of Health   Financial Resource Strain: Not on file  Food Insecurity: Not on file  Transportation Needs: Not on file  Physical Activity: Not on file  Stress: Not on file  Social Connections: Not on file   Family History  Problem Relation Age of Onset  . Hypertension Mother   . Diverticulosis Mother   . Osteoporosis Mother   . Colon polyps Mother   . Diabetes Father   . Alcohol abuse Father   . Osteoporosis Sister   . Hypertension Sister   . Diabetes Brother   . Hypertension Brother   . Colon polyps Brother   . Hypertension Maternal Grandmother   . Heart disease Maternal Grandfather   . Diabetes Paternal Grandfather   . Diabetes Brother   . Hypertension Brother   . Heart attack Brother   . Osteoporosis Sister   . Colon cancer Neg Hx   . Esophageal cancer Neg Hx   . Stomach cancer Neg Hx   . Rectal cancer Neg Hx  Allergies  Allergen Reactions  . Penicillins Hives and Rash    Has patient had a PCN reaction causing immediate rash, facial/tongue/throat swelling, SOB or lightheadedness with hypotension: Yes Has patient had a PCN reaction causing severe rash involving mucus membranes or skin necrosis: No Has patient had a PCN reaction that required hospitalization No Has patient had a PCN reaction occurring within the last 10 years: No If all of the above answers are "NO", then may proceed with Cephalosporin use.    Prior to Admission medications   Medication Sig Start Date End Date Taking? Authorizing Provider  aspirin EC 81 MG tablet Take 81 mg by mouth every morning.   Yes [provider]  atorvastatin (LIPITOR) 20 MG tablet TAKE 1 TABLET (20 MG  TOTAL) BY MOUTH DAILY AT 6 PM. 08/02/19  Yes [name], MD  CHROMIUM-CINNAMON PO Take 1 tablet by mouth 2 (two) times daily.   Yes [provider]  Clobetasol Propionate 0.05 % lotion Apply 1 application topically daily as needed (psoriasis).   Yes [provider]  cyclobenzaprine (FLEXERIL) 5 MG tablet Take 5 mg by mouth 3 (three) times daily. 11/11/20  Yes [provider]  Dulaglutide (TRULICITY) 3 RX/5.4MG SOPN Inject 3 mg into the skin every Monday.   Yes [provider]  empagliflozin (JARDIANCE) 25 MG TABS tablet Take 25 mg by mouth every morning.   Yes [provider]  fluticasone (FLONASE) 50 MCG/ACT nasal spray PLACE 1-2 SPRAYS INTO BOTH NOSTRILS DAILY. Patient taking differently: Place 1-2 sprays into both nostrils daily as needed for allergies or rhinitis. 05/10/19  Yes [name], MD  gabapentin (NEURONTIN) 300 MG capsule Take 1 capsule (300 mg total) by mouth at bedtime. 02/23/19  Yes [name], MD  lisinopril (PRINIVIL,ZESTRIL) 10 MG tablet Take 1 tablet (10 mg total) by mouth daily. 10/06/18  Yes [name], MD  loratadine (CLARITIN) 10 MG tablet Take 10 mg by mouth daily as needed for allergies, rhinitis or itching.   Yes [provider]  meloxicam (MOBIC) 15 MG tablet Take 15 mg by mouth in the morning. 11/11/20  Yes [provider]  metFORMIN (GLUCOPHAGE) 1000 MG tablet TAKE 1 TABLET (1,000 MG TOTAL) BY MOUTH 2 (TWO) TIMES DAILY WITH A MEAL. 07/27/19  Yes [name], MD  Multiple Vitamin (MULTIVITAMIN WITH MINERALS) TABS tablet Take 1 tablet by mouth every morning. Men's 50 plus   Yes [provider]  BAYER MICROLET LANCETS lancets TEST UP TO 3 TIMES A DAY 01/11/18   [name], MD  blood glucose meter kit and supplies Dispense based on patient and insurance preference. Use up to 3 times daily, uncontrolled diabetes.  Contour Next meter with test strips and lancets. 11/15/17    [name], MD  CONTOUR NEXT TEST test strip TEST UP TO 3 TIMES A DAY 11/12/19   [name], MD  Dulaglutide (TRULICITY) 8.67 YP/9.5KD SOPN Inject 0.75 mg into the skin once a week. E11.65 Patient not taking: No sig reported 11/27/19   Shamleffer, Melanie Crazier, MD  insulin aspart (NOVOLOG FLEXPEN) 100 UNIT/ML FlexPen Inject 12 Units into the skin 3 (three) times daily with meals. Max daily 42 units Patient not taking: No sig reported 04/05/19   Shamleffer, Melanie Crazier, MD  Insulin Pen Needle 32G X 4 MM MISC 4x daily 04/05/19   Shamleffer, Melanie Crazier, MD  LANTUS SOLOSTAR 100 UNIT/ML Solostar Pen INJECT 50 UNITS INTO THE SKIN DAILY.  Patient not taking: No sig reported 11/30/19   Shamleffer, Melanie Crazier, MD   MR SHOULDER LEFT WO CONTRAST  Result Date: 11/16/2020 CLINICAL DATA:  Fever.  Left shoulder pain. EXAM: MRI OF THE LEFT SHOULDER WITHOUT CONTRAST TECHNIQUE: Multiplanar, multisequence MR imaging of the shoulder was performed. No intravenous contrast was administered. COMPARISON:  Left shoulder x-rays from same day. FINDINGS: Rotator cuff: Intact rotator cuff. Severe supraspinatus tendinosis. Muscles: Patchy edema primarily involving the deltoid and subscapularis muscles. No muscle atrophy. Biceps long head: Intact and normally positioned. Severe intra-articular tendinosis. Acromioclavicular Joint: Mild arthropathy of the acromioclavicular joint. Type II acromion. Large amount of complex fluid involving the subacromial/subdeltoid bursa. Glenohumeral Joint: Moderate joint effusion.  No chondral defect. Labrum:  Large superior labral tear.  Remaining labrum is intact. Bones: No acute fracture or dislocation. No suspicious bone lesion. Other: Mild diffuse soft tissue swelling about the shoulder. IMPRESSION: 1. Findings most consistent with glenohumeral septic arthritis and subacromial/subdeltoid septic bursitis. 2. Associated deltoid and subscapularis myositis. No evidence of  osteomyelitis. 3. Intact rotator cuff. Severe supraspinatus and intra-articular biceps tendinosis. 4. Large superior labral tear. Electronically Signed   By: Titus Dubin M.D.   On: 11/16/2020 14:03   DG Chest Port 1 View  Result Date: 11/15/2020 CLINICAL DATA:  Sepsis. EXAM: PORTABLE CHEST 1 VIEW COMPARISON:  07/05/2017 FINDINGS: The cardiac silhouette, mediastinal and hilar contours are within normal limits. Low lung volumes with vascular crowding and streaky subsegmental bibasilar atelectasis. No infiltrates or effusions. The bony thorax is intact. IMPRESSION: Low lung volumes with vascular crowding and streaky subsegmental bibasilar atelectasis. Electronically Signed   By: Marijo Sanes M.D.   On: 11/15/2020 13:48   DG Shoulder Left  Result Date: 11/16/2020 CLINICAL DATA:  One-week history of LEFT shoulder pain. No known injuries. EXAM: LEFT SHOULDER - 2+ VIEW COMPARISON:  11/11/2020. FINDINGS: No evidence of acute, subacute or healed fracture. Glenohumeral joint anatomically aligned with well-preserved joint space. Subacromial space well-preserved. Acromioclavicular joint anatomically aligned without significant degenerative changes. Well preserved bone mineral density. No intrinsic osseous abnormality. IMPRESSION: Normal examination. Electronically Signed   By: Evangeline Dakin M.D.   On: 11/16/2020 15:52    Positive ROS: All other systems have been reviewed and were otherwise negative with the exception of those mentioned in the HPI and as above.  Physical Exam: General: Alert, no acute distress Cardiovascular: No pedal edema Respiratory: No cyanosis, no use of accessory musculature GI: No organomegaly, abdomen is soft and non-tender Skin: No lesions in the area of chief complaint Neurologic: Sensation intact distally Psychiatric: Patient is competent for consent with normal mood and affect Lymphatic: No axillary or cervical lymphadenopathy  MUSCULOSKELETAL:  Left upper  extremity:  He has some fullness to the shoulder but no obvious fluctuance.  No erythema.  Does have some warmth.  Pain with active and passive range range of motion through the shoulder.  Neurovascular intact throughout.  Assessment: Left septic shoulder  Plan: -My recommendation based on clinical exam and MRI findings would be for arthroscopic irrigation and debridement of the left shoulder.  This is something that would likely help with source control.  We will also take further cultures, however given the prolonged period on IV antibiotics he may not get a great specimen.  However this may help tailor post procedure antibiotics.  -Appreciate the medicine service for their excellent care of this patient.  He will be n.p.o. tonight at midnight.  -I have posted him for surgery tomorrow, Monday, April  11 in the afternoon.  -Questions were solicited and answered to patient satisfaction regarding surgery.  We discussed the risk of bleeding, infection, damage to surrounding nerves and vessels, stiffness, rotator cuff pathology, development of post infectious arthrosis, DVT, risk of anesthesia.    Nicholes Stairs, MD Cell [phone]    11/17/2020 10:55 AM

## 2020-11-17 NOTE — Progress Notes (Addendum)
PROGRESS NOTE    Logan [name]  ZGY:[id number] DOB: 1970-10-27 DOA: 11/15/2020 PCP: Rudene Anda, MD   Chief Complain: Right shoulder pain, fever  Brief Narrative: Patient is a 50 year old male with history of insulin-dependent diabetes mellitus, diabetic neuropathy, hypertension, psoriasis who presented with new onset of right shoulder pain, swelling and fever.  Patient was seen by orthopedics as an outpatient who sent him to [facility] emergency department.  Arthrocentesis of the left shoulder was done in the emergency with no yield and patient was admitted to [facility] for further work-up.  Presentation he was febrile, had leukocytosis, elevated ESR.  Started on broad spectrum  antibiotics for suspicion of septic arthritis of left shoulder.MRI confirmed septic arthritis.  Orthopedics following,plan for orthopedic intervention tomorrow  Assessment & Plan:   Active Problems:   Sepsis ([place])   Acute pain of left shoulder   Sepsis secondary to left shoulder septic arthritis: Presented with fever, leukocytosis.  Patient was having severe left shoulder pain, swelling. Patient was seen by orthopedics as an outpatient who sent him to [facility] emergency department.  Arthrocentesis of the left shoulder was done in the emergency with no yield and patient was admitted to [facility] for further work-up.    Continue vancomycin and cefepime.  Continue IV fluids.  Orthopedics following MRI showed glenohumeral septic arthritis and subacromial/subdeltoid septic bursitis,associated deltoid and subscapularis myositis. No evidence of Osteomyelitis. Orthopedics planning for intervention tomorrow.  We will involve infectious disease when appropriate.  We will follow up culture report. Mild grade fever last night.  Still has persistent leukocytosis.  Insulin-dependent diabetes mellitus: Monitor blood sugars.  Continue sliding scale insulin for now.  Hypertension: Currently blood pressure stable without any medications.   Continue to hold  Diabetic neuropathy: On gabapentin  Hyperlipidemia: On statin         DVT prophylaxis:Lovenox Code Status: Full Family Communication: None at bedside Status is: Inpatient  Remains inpatient appropriate because:Inpatient level of care appropriate due to severity of illness   Dispo: The patient is from: Home              Anticipated d/c is to: Home              Patient currently is not medically stable to d/c.   Difficult to place patient No   Consultants: Orthopedics  Procedures:None  Antimicrobials:  Anti-infectives (From admission, onward)   Start     Dose/Rate Route Frequency Ordered Stop   11/16/20 0000  vancomycin (VANCOREADY) IVPB 750 mg/150 mL        750 mg 150 mL/hr over 60 Minutes Intravenous Every 12 hours 11/15/20 1538     11/15/20 1600  ceFEPIme (MAXIPIME) 2 g in sodium chloride 0.9 % 100 mL IVPB        2 g 200 mL/hr over 30 Minutes Intravenous Every 8 hours 11/15/20 1532     11/15/20 1600  metroNIDAZOLE (FLAGYL) IVPB 500 mg  Status:  Discontinued        500 mg 100 mL/hr over 60 Minutes Intravenous Every 8 hours 11/15/20 1532 11/16/20 0747   11/15/20 1530  ceFEPIme (MAXIPIME) 2 g in sodium chloride 0.9 % 100 mL IVPB  Status:  Discontinued        2 g 200 mL/hr over 30 Minutes Intravenous  Once 11/15/20 1528 11/15/20 1532   11/15/20 1530  metroNIDAZOLE (FLAGYL) IVPB 500 mg  Status:  Discontinued        500 mg 100 mL/hr over 60 Minutes  Intravenous  Once 11/15/20 1528 11/15/20 1532   11/15/20 1530  vancomycin (VANCOCIN) IVPB 1000 mg/200 mL premix        1,000 mg 200 mL/hr over 60 Minutes Intravenous  Once 11/15/20 1528 11/15/20 1656      Subjective: Patient seen and examined the bedside this morning.  Hemodynamically stable.  Still complaining of soreness on the left shoulder.  Afebrile  Objective: Vitals:   11/16/20 0841 11/16/20 1714 11/16/20 2006 11/17/20 0610  BP: 127/77 132/79 126/80 133/80  Pulse: 90 95 88 85  Resp: $Remo'17 17 17  17  'nKiRT$ Temp: 98 F (36.7 C) (!) 100.5 F (38.1 C) 99.3 F (37.4 C) 98.4 F (36.9 C)  TempSrc: Oral Oral Oral Oral  SpO2: 96% 99% 95% 97%  Weight:      Height:       No intake or output data in the 24 hours ending 11/17/20 0801 Filed Weights   11/15/20 1227  Weight: 82.1 kg    Examination:  General exam: Overall comfortable, not in distress HEENT: PERRL Respiratory system:  no wheezes or crackles  Cardiovascular system: S1 & S2 heard, RRR.  Gastrointestinal system: Abdomen is nondistended, soft and nontender. Central nervous system: Alert and oriented Extremities: No edema, no clubbing ,no cyanosis, tenderness on the left shoulder Skin: No rashes, no ulcers,no icterus    Data Reviewed: I have personally reviewed following labs and imaging studies  CBC: Recent Labs  Lab 11/15/20 1326 11/16/20 0150 11/17/20 0241  WBC 17.4* 15.1* 15.3*  NEUTROABS 14.5*  --  11.3*  HGB 13.5 12.4* 12.6*  HCT 40.4 37.8* 39.3  MCV 88.2 89.4 92.9  PLT 444* 446* 970*   Basic Metabolic Panel: Recent Labs  Lab 11/15/20 1326 11/16/20 0150  NA 132* 133*  K 4.1 3.8  CL 99 105  CO2 19* 20*  GLUCOSE 236* 120*  BUN 19 19  CREATININE 0.90 0.95  CALCIUM 8.6* 8.2*   GFR: Estimated Creatinine Clearance: 90.7 mL/min (by C-G formula based on SCr of 0.95 mg/dL). Liver Function Tests: Recent Labs  Lab 11/15/20 1326  AST 43*  ALT 43  ALKPHOS 179*  BILITOT 1.1  PROT 7.4  ALBUMIN 3.0*   No results for input(s): LIPASE, AMYLASE in the last 168 hours. No results for input(s): AMMONIA in the last 168 hours. Coagulation Profile: Recent Labs  Lab 11/15/20 1326  INR 1.0   Cardiac Enzymes: No results for input(s): CKTOTAL, CKMB, CKMBINDEX, TROPONINI in the last 168 hours. BNP (last 3 results) No results for input(s): PROBNP in the last 8760 hours. HbA1C: Recent Labs    11/16/20 0150  HGBA1C 8.3*   CBG: Recent Labs  Lab 11/16/20 1007 11/16/20 1402 11/16/20 1716 11/16/20 2009  11/17/20 0603  GLUCAP 97 125* 162* 151* 112*   Lipid Profile: No results for input(s): CHOL, HDL, LDLCALC, TRIG, CHOLHDL, LDLDIRECT in the last 72 hours. Thyroid Function Tests: No results for input(s): TSH, T4TOTAL, FREET4, T3FREE, THYROIDAB in the last 72 hours. Anemia Panel: No results for input(s): VITAMINB12, FOLATE, FERRITIN, TIBC, IRON, RETICCTPCT in the last 72 hours. Sepsis Labs: Recent Labs  Lab 11/15/20 1326 11/15/20 1833  LATICACIDVEN 1.6 1.1    Recent Results (from the past 240 hour(s))  Blood Culture (routine x 2)     Status: None (Preliminary result)   Collection Time: 11/15/20  1:36 PM   Specimen: BLOOD RIGHT FOREARM  Result Value Ref Range Status   Specimen Description   Final    BLOOD RIGHT  FOREARM BLOOD Performed at [facility], [address] [place], [place] [address]    Special Requests   Final    Blood Culture results may not be optimal due to an excessive volume of blood received in culture bottles Ruth Performed at [facility], [address] [place], [place] [address]    Culture   Final    NO GROWTH < 24 HOURS Performed at [facility], [place] [address] [place], [place] [address]    Report Status PENDING  Incomplete  Blood Culture (routine x 2)     Status: None (Preliminary result)   Collection Time: 11/15/20  1:40 PM   Specimen: BLOOD LEFT HAND  Result Value Ref Range Status   Specimen Description   Final    BLOOD LEFT HAND BLOOD Performed at [facility], [address] [place], [place] [address]    Special Requests   Final    BOTTLES DRAWN AEROBIC AND ANAEROBIC Blood Culture results may not be optimal due to an excessive volume of blood received in culture bottles Performed at [facility], [address] [place], [place] [address]    Culture   Final    NO GROWTH < 24 HOURS Performed at [facility], [place] [address], [place], [place] [address]     Report Status PENDING  Incomplete  Resp Panel by RT-PCR (Flu A&B, Covid)     Status: None   Collection Time: 11/15/20  1:46 PM   Specimen: Nasopharyngeal(NP) swabs in vial transport medium  Result Value Ref Range Status   SARS Coronavirus 2 by RT PCR NEGATIVE NEGATIVE Final    Comment: (NOTE) SARS-CoV-2 target nucleic acids are NOT DETECTED.  The SARS-CoV-2 RNA is generally detectable in upper respiratory specimens during the acute phase of infection. The lowest concentration of SARS-CoV-2 viral copies this assay can detect is 138 copies/mL. A negative result does not preclude SARS-Cov-2 infection and should not be used as the sole basis for treatment or other patient management decisions. A negative result may occur with  improper specimen collection/handling, submission of specimen other than nasopharyngeal swab, presence of viral mutation(s) within the areas targeted by this assay, and inadequate number of viral copies(<138 copies/mL). A negative result must be combined with clinical observations, patient history, and epidemiological information. The expected result is Negative.  Fact Sheet for Patients:  EntrepreneurPulse.com.au  Fact Sheet for Healthcare Providers:  IncredibleEmployment.be  This test is no t yet approved or cleared by the [place] FDA and  has been authorized for detection and/or diagnosis of SARS-CoV-2 by FDA under an Emergency Use Authorization (EUA). This EUA will remain  in effect (meaning this test can be used) for the duration of the COVID-19 declaration under Section 564(b)(1) of the Act, 21 U.S.C.section 360bbb-3(b)(1), unless the authorization is terminated  or revoked sooner.       Influenza A by PCR NEGATIVE NEGATIVE Final   Influenza B by PCR NEGATIVE NEGATIVE Final    Comment: (NOTE) The Xpert Xpress SARS-CoV-2/FLU/RSV plus assay is intended as an aid in the diagnosis of influenza from Nasopharyngeal  swab specimens and should not be used as a sole basis for treatment. Nasal washings and aspirates are unacceptable for Xpert Xpress SARS-CoV-2/FLU/RSV testing.  Fact Sheet for Patients: EntrepreneurPulse.com.au  Fact Sheet for Healthcare Providers: IncredibleEmployment.be  This test is not yet approved or cleared by the [place] FDA  and has been authorized for detection and/or diagnosis of SARS-CoV-2 by FDA under an Emergency Use Authorization (EUA). This EUA will remain in effect (meaning this test can be used) for the duration of the COVID-19 declaration under Section 564(b)(1) of the Act, 21 U.S.C. section 360bbb-3(b)(1), unless the authorization is terminated or revoked.  Performed at [facility], [address] [place], [place] [address]   Urine culture     Status: Abnormal   Collection Time: 11/15/20  2:12 PM   Specimen: Urine, Random  Result Value Ref Range Status   Specimen Description   Final    URINE, RANDOM Performed at [facility], [address] [place], [place] [address]    Special Requests   Final    NONE Performed at [facility], [address] [place], [place] [address]    Culture (A)  Final    <10,000 COLONIES/mL INSIGNIFICANT GROWTH Performed at [facility], [place] [address] [place], [place] [address]    Report Status 11/16/2020 FINAL  Final         Radiology Studies: MR SHOULDER LEFT WO CONTRAST  Result Date: 11/16/2020 CLINICAL DATA:  Fever.  Left shoulder pain. EXAM: MRI OF THE LEFT SHOULDER WITHOUT CONTRAST TECHNIQUE: Multiplanar, multisequence MR imaging of the shoulder was performed. No intravenous contrast was administered. COMPARISON:  Left shoulder x-rays from same day. FINDINGS: Rotator cuff: Intact rotator cuff. Severe supraspinatus tendinosis. Muscles: Patchy edema primarily involving the deltoid and subscapularis muscles. No muscle atrophy. Biceps long  head: Intact and normally positioned. Severe intra-articular tendinosis. Acromioclavicular Joint: Mild arthropathy of the acromioclavicular joint. Type II acromion. Large amount of complex fluid involving the subacromial/subdeltoid bursa. Glenohumeral Joint: Moderate joint effusion.  No chondral defect. Labrum:  Large superior labral tear.  Remaining labrum is intact. Bones: No acute fracture or dislocation. No suspicious bone lesion. Other: Mild diffuse soft tissue swelling about the shoulder. IMPRESSION: 1. Findings most consistent with glenohumeral septic arthritis and subacromial/subdeltoid septic bursitis. 2. Associated deltoid and subscapularis myositis. No evidence of osteomyelitis. 3. Intact rotator cuff. Severe supraspinatus and intra-articular biceps tendinosis. 4. Large superior labral tear. Electronically Signed   By: Titus Dubin M.D.   On: 11/16/2020 14:03   DG Chest Port 1 View  Result Date: 11/15/2020 CLINICAL DATA:  Sepsis. EXAM: PORTABLE CHEST 1 VIEW COMPARISON:  07/05/2017 FINDINGS: The cardiac silhouette, mediastinal and hilar contours are within normal limits. Low lung volumes with vascular crowding and streaky subsegmental bibasilar atelectasis. No infiltrates or effusions. The bony thorax is intact. IMPRESSION: Low lung volumes with vascular crowding and streaky subsegmental bibasilar atelectasis. Electronically Signed   By: Marijo Sanes M.D.   On: 11/15/2020 13:48   DG Shoulder Left  Result Date: 11/16/2020 CLINICAL DATA:  One-week history of LEFT shoulder pain. No known injuries. EXAM: LEFT SHOULDER - 2+ VIEW COMPARISON:  11/11/2020. FINDINGS: No evidence of acute, subacute or healed fracture. Glenohumeral joint anatomically aligned with well-preserved joint space. Subacromial space well-preserved. Acromioclavicular joint anatomically aligned without significant degenerative changes. Well preserved bone mineral density. No intrinsic osseous abnormality. IMPRESSION: Normal  examination. Electronically Signed   By: Evangeline Dakin M.D.   On: 11/16/2020 15:52        Scheduled Meds: . aspirin EC  81 mg Oral Daily  . atorvastatin  20 mg Oral q1800  . enoxaparin (LOVENOX) injection  40 mg Subcutaneous Q24H  . gabapentin  300 mg Oral QHS  . insulin  aspart  0-15 Units Subcutaneous TID WC  . vitamin B-12  1,000 mcg Oral Daily   Continuous Infusions: . ceFEPime (MAXIPIME) IV 2 g (11/16/20 2323)  . vancomycin 750 mg (11/17/20 0031)     LOS: 2 days    Time spent: 25 mins.More than 50% of that time was spent in counseling and/or coordination of care.      Shelly Coss, MD [facility] P4/05/2021, 8:01 AM

## 2020-11-18 ENCOUNTER — Encounter (HOSPITAL_COMMUNITY): Payer: Self-pay | Admitting: Internal Medicine

## 2020-11-18 ENCOUNTER — Encounter (HOSPITAL_COMMUNITY)
Admission: EM | Disposition: A | Discharge status: Home Health | Payer: Self-pay | Source: Home / Self Care | Attending: Internal Medicine

## 2020-11-18 ENCOUNTER — Inpatient Hospital Stay (HOSPITAL_COMMUNITY): Payer: Commercial Managed Care - PPO | Admitting: Anesthesiology

## 2020-11-18 HISTORY — PX: SHOULDER ARTHROSCOPY: SHX128

## 2020-11-18 LAB — CBC WITH DIFFERENTIAL/PLATELET
Abs Immature Granulocytes: 0.56 10*3/uL — ABNORMAL HIGH (ref 0.00–0.07)
Basophils Absolute: 0.1 10*3/uL (ref 0.0–0.1)
Basophils Relative: 1 %
Eosinophils Absolute: 0.4 10*3/uL (ref 0.0–0.5)
Eosinophils Relative: 2 %
HCT: 39.4 % (ref 39.0–52.0)
Hemoglobin: 13.3 g/dL (ref 13.0–17.0)
Immature Granulocytes: 4 %
Lymphocytes Relative: 12 %
Lymphs Abs: 1.8 10*3/uL (ref 0.7–4.0)
MCH: 29.4 pg (ref 26.0–34.0)
MCHC: 33.8 g/dL (ref 30.0–36.0)
MCV: 87.2 fL (ref 80.0–100.0)
Monocytes Absolute: 1.8 10*3/uL — ABNORMAL HIGH (ref 0.1–1.0)
Monocytes Relative: 12 %
Neutro Abs: 10 10*3/uL — ABNORMAL HIGH (ref 1.7–7.7)
Neutrophils Relative %: 69 %
Platelets: 402 10*3/uL — ABNORMAL HIGH (ref 150–400)
RBC: 4.52 MIL/uL (ref 4.22–5.81)
RDW: 13.8 % (ref 11.5–15.5)
WBC: 14.6 10*3/uL — ABNORMAL HIGH (ref 4.0–10.5)
nRBC: 0 % (ref 0.0–0.2)

## 2020-11-18 LAB — GLUCOSE, CAPILLARY
Glucose-Capillary: 142 mg/dL — ABNORMAL HIGH (ref 70–99)
Glucose-Capillary: 96 mg/dL (ref 70–99)
Glucose-Capillary: 77 mg/dL (ref 70–99)
Glucose-Capillary: 93 mg/dL (ref 70–99)
Glucose-Capillary: 147 mg/dL — ABNORMAL HIGH (ref 70–99)
Glucose-Capillary: 239 mg/dL — ABNORMAL HIGH (ref 70–99)

## 2020-11-18 LAB — SURGICAL PCR SCREEN
MRSA, PCR: NEGATIVE
Staphylococcus aureus: NEGATIVE

## 2020-11-18 SURGERY — ARTHROSCOPY, SHOULDER
Anesthesia: General | Site: Shoulder | Laterality: Left

## 2020-11-18 MED ORDER — ESMOLOL HCL 100 MG/10ML IV SOLN
INTRAVENOUS | Status: DC | PRN
  Administered 2020-11-18 (×2): 30 mg via INTRAVENOUS

## 2020-11-18 MED ORDER — ONDANSETRON HCL 4 MG/2ML IJ SOLN
4.0000 mg | Freq: Four times a day (QID) | INTRAMUSCULAR | Status: DC | PRN
  Filled 2020-11-18: qty 2

## 2020-11-18 MED ORDER — CEFAZOLIN SODIUM-DEXTROSE 2-4 GM/100ML-% IV SOLN
2.0000 g | INTRAVENOUS | Status: AC
  Administered 2020-11-18: 2 g via INTRAVENOUS
  Filled 2020-11-18: qty 100

## 2020-11-18 MED ORDER — OXYCODONE HCL 5 MG/5ML PO SOLN: 5.0000 mg | Freq: Once | ORAL | Status: DC | PRN

## 2020-11-18 MED ORDER — EPINEPHRINE PF 1 MG/ML IJ SOLN
INTRAMUSCULAR | Status: AC
  Filled 2020-11-18: qty 1

## 2020-11-18 MED ORDER — KETOROLAC TROMETHAMINE 30 MG/ML IJ SOLN
INTRAMUSCULAR | Status: AC
  Filled 2020-11-18: qty 1

## 2020-11-18 MED ORDER — ARTIFICIAL TEARS OPHTHALMIC OINT
TOPICAL_OINTMENT | OPHTHALMIC | Status: AC
  Filled 2020-11-18: qty 3.5

## 2020-11-18 MED ORDER — ONDANSETRON HCL 4 MG/2ML IJ SOLN
INTRAMUSCULAR | Status: DC | PRN
  Administered 2020-11-18: 4 mg via INTRAVENOUS

## 2020-11-18 MED ORDER — ESMOLOL HCL 100 MG/10ML IV SOLN
INTRAVENOUS | Status: AC
  Filled 2020-11-18: qty 10

## 2020-11-18 MED ORDER — PHENOL 1.4 % MT LIQD
1.0000 | OROMUCOSAL | Status: DC | PRN
  Administered 2020-11-19: 1 via OROMUCOSAL
  Filled 2020-11-18: qty 177

## 2020-11-18 MED ORDER — MIDAZOLAM HCL 2 MG/2ML IJ SOLN
INTRAMUSCULAR | Status: AC
  Filled 2020-11-18: qty 2

## 2020-11-18 MED ORDER — LACTATED RINGERS IV SOLN: INTRAVENOUS | Status: DC

## 2020-11-18 MED ORDER — METOCLOPRAMIDE HCL 5 MG PO TABS
5.0000 mg | ORAL_TABLET | Freq: Three times a day (TID) | ORAL | Status: DC | PRN
  Filled 2020-11-18: qty 2

## 2020-11-18 MED ORDER — ONDANSETRON HCL 4 MG PO TABS
4.0000 mg | ORAL_TABLET | Freq: Four times a day (QID) | ORAL | Status: DC | PRN
  Filled 2020-11-18: qty 1

## 2020-11-18 MED ORDER — PHENYLEPHRINE 40 MCG/ML (10ML) SYRINGE FOR IV PUSH (FOR BLOOD PRESSURE SUPPORT)
PREFILLED_SYRINGE | INTRAVENOUS | Status: DC | PRN
Start: 2020-11-18 — End: 2020-11-18
  Administered 2020-11-18: 80 ug via INTRAVENOUS

## 2020-11-18 MED ORDER — METOCLOPRAMIDE HCL 5 MG/ML IJ SOLN
5.0000 mg | Freq: Three times a day (TID) | INTRAMUSCULAR | Status: DC | PRN
  Filled 2020-11-18: qty 2

## 2020-11-18 MED ORDER — POVIDONE-IODINE 10 % EX SWAB
2.0000 "application " | Freq: Once | CUTANEOUS | Status: AC
  Administered 2020-11-18: 2 via TOPICAL

## 2020-11-18 MED ORDER — LIDOCAINE 2% (20 MG/ML) 5 ML SYRINGE
INTRAMUSCULAR | Status: DC | PRN
  Administered 2020-11-18: 60 mg via INTRAVENOUS

## 2020-11-18 MED ORDER — MENTHOL 3 MG MT LOZG: 1.0000 | LOZENGE | OROMUCOSAL | Status: DC | PRN

## 2020-11-18 MED ORDER — ORAL CARE MOUTH RINSE
15.0000 mL | Freq: Once | OROMUCOSAL | Status: AC
Start: 2020-11-18 — End: 2020-11-18

## 2020-11-18 MED ORDER — ROCURONIUM BROMIDE 10 MG/ML (PF) SYRINGE
PREFILLED_SYRINGE | INTRAVENOUS | Status: AC
  Filled 2020-11-18: qty 10

## 2020-11-18 MED ORDER — PHENYLEPHRINE 40 MCG/ML (10ML) SYRINGE FOR IV PUSH (FOR BLOOD PRESSURE SUPPORT)
PREFILLED_SYRINGE | INTRAVENOUS | Status: AC
  Filled 2020-11-18: qty 10

## 2020-11-18 MED ORDER — FENTANYL CITRATE (PF) 100 MCG/2ML IJ SOLN
INTRAMUSCULAR | Status: AC
  Filled 2020-11-18: qty 2

## 2020-11-18 MED ORDER — KETOROLAC TROMETHAMINE 30 MG/ML IJ SOLN
30.0000 mg | Freq: Once | INTRAMUSCULAR | Status: AC
  Administered 2020-11-18: 30 mg via INTRAVENOUS

## 2020-11-18 MED ORDER — CHLORHEXIDINE GLUCONATE 4 % EX LIQD
60.0000 mL | Freq: Once | CUTANEOUS | Status: AC
  Administered 2020-11-18: 4 via TOPICAL
  Filled 2020-11-18: qty 60

## 2020-11-18 MED ORDER — CHLORHEXIDINE GLUCONATE 0.12 % MT SOLN: 15.0000 mL | Freq: Once | OROMUCOSAL | Status: AC

## 2020-11-18 MED ORDER — SODIUM CHLORIDE 0.9 % IR SOLN
Status: DC | PRN
  Administered 2020-11-18: 6000 mL

## 2020-11-18 MED ORDER — CHLORHEXIDINE GLUCONATE 0.12 % MT SOLN
OROMUCOSAL | Status: AC
  Administered 2020-11-18: 15 mL via OROMUCOSAL
  Filled 2020-11-18: qty 15

## 2020-11-18 MED ORDER — AMISULPRIDE (ANTIEMETIC) 5 MG/2ML IV SOLN: 10.0000 mg | Freq: Once | INTRAVENOUS | Status: DC | PRN

## 2020-11-18 MED ORDER — FENTANYL CITRATE (PF) 100 MCG/2ML IJ SOLN
INTRAMUSCULAR | Status: DC | PRN
  Administered 2020-11-18: 150 ug via INTRAVENOUS

## 2020-11-18 MED ORDER — PROPOFOL 10 MG/ML IV BOLUS
INTRAVENOUS | Status: DC | PRN
  Administered 2020-11-18: 200 mg via INTRAVENOUS

## 2020-11-18 MED ORDER — ROCURONIUM BROMIDE 100 MG/10ML IV SOLN
INTRAVENOUS | Status: DC | PRN
  Administered 2020-11-18: 30 mg via INTRAVENOUS

## 2020-11-18 MED ORDER — PROMETHAZINE HCL 25 MG/ML IJ SOLN: 6.2500 mg | INTRAMUSCULAR | Status: DC | PRN

## 2020-11-18 MED ORDER — MIDAZOLAM HCL 5 MG/5ML IJ SOLN
INTRAMUSCULAR | Status: DC | PRN
  Administered 2020-11-18: 2 mg via INTRAVENOUS

## 2020-11-18 MED ORDER — DOCUSATE SODIUM 100 MG PO CAPS
100.0000 mg | ORAL_CAPSULE | Freq: Two times a day (BID) | ORAL | Status: DC
  Administered 2020-11-18 – 2020-11-22 (×8): 100 mg via ORAL
  Filled 2020-11-18 (×8): qty 1

## 2020-11-18 MED ORDER — ONDANSETRON HCL 4 MG/2ML IJ SOLN
INTRAMUSCULAR | Status: AC
  Filled 2020-11-18: qty 2

## 2020-11-18 MED ORDER — FENTANYL CITRATE (PF) 100 MCG/2ML IJ SOLN
25.0000 ug | INTRAMUSCULAR | Status: DC | PRN
  Administered 2020-11-18 (×3): 50 ug via INTRAVENOUS

## 2020-11-18 MED ORDER — OXYCODONE HCL 5 MG PO TABS: 5.0000 mg | ORAL_TABLET | Freq: Once | ORAL | Status: DC | PRN

## 2020-11-18 MED ORDER — BUPIVACAINE-EPINEPHRINE 0.5% -1:200000 IJ SOLN
INTRAMUSCULAR | Status: AC
  Filled 2020-11-18: qty 1

## 2020-11-18 MED ORDER — DIPHENHYDRAMINE HCL 50 MG/ML IJ SOLN
INTRAMUSCULAR | Status: AC
  Filled 2020-11-18: qty 1

## 2020-11-18 MED ORDER — FENTANYL CITRATE (PF) 250 MCG/5ML IJ SOLN
INTRAMUSCULAR | Status: AC
  Filled 2020-11-18: qty 5

## 2020-11-18 MED ORDER — FENTANYL CITRATE (PF) 250 MCG/5ML IJ SOLN: INTRAMUSCULAR | Status: DC | PRN

## 2020-11-18 MED ORDER — PROPOFOL 10 MG/ML IV BOLUS
INTRAVENOUS | Status: AC
  Filled 2020-11-18: qty 20

## 2020-11-18 MED ORDER — LIDOCAINE 2% (20 MG/ML) 5 ML SYRINGE
INTRAMUSCULAR | Status: AC
  Filled 2020-11-18: qty 5

## 2020-11-18 MED ORDER — DEXAMETHASONE SODIUM PHOSPHATE 10 MG/ML IJ SOLN
INTRAMUSCULAR | Status: DC | PRN
  Administered 2020-11-18: 5 mg via INTRAVENOUS

## 2020-11-18 MED ORDER — DEXAMETHASONE SODIUM PHOSPHATE 10 MG/ML IJ SOLN
INTRAMUSCULAR | Status: AC
  Filled 2020-11-18: qty 1

## 2020-11-18 MED ORDER — DIPHENHYDRAMINE HCL 50 MG/ML IJ SOLN
INTRAMUSCULAR | Status: DC | PRN
  Administered 2020-11-18: 6.25 mg via INTRAVENOUS

## 2020-11-18 MED ORDER — ACETAMINOPHEN 10 MG/ML IV SOLN: 1000.0000 mg | Freq: Once | INTRAVENOUS | Status: DC | PRN

## 2020-11-18 SURGICAL SUPPLY — 42 items
BLADE EXCALIBUR 4.0X13 (MISCELLANEOUS) ×2 IMPLANT
BLADE SURG 11 STRL SS (BLADE) ×2 IMPLANT
COVER WAND RF STERILE (DRAPES) ×2 IMPLANT
DRAPE IMP U-DRAPE 54X76 (DRAPES) ×2 IMPLANT
DRAPE STERI 35X30 U-POUCH (DRAPES) ×2 IMPLANT
DRAPE SURG 17X23 STRL (DRAPES) ×2 IMPLANT
DRAPE U-SHAPE 47X51 STRL (DRAPES) ×2 IMPLANT
DRSG EMULSION OIL 3X3 NADH (GAUZE/BANDAGES/DRESSINGS) ×2 IMPLANT
DRSG PAD ABDOMINAL 8X10 ST (GAUZE/BANDAGES/DRESSINGS) ×2 IMPLANT
ELECT MENISCUS 165MM 90D (ELECTRODE) ×2 IMPLANT
FILTER STRAW FLUID ASPIR (MISCELLANEOUS) ×2 IMPLANT
GAUZE SPONGE 4X4 12PLY STRL (GAUZE/BANDAGES/DRESSINGS) ×2 IMPLANT
GLOVE SURG SS PI 8.0 STRL IVOR (GLOVE) ×2 IMPLANT
GOWN STRL REUS W/ TWL LRG LVL3 (GOWN DISPOSABLE) ×2 IMPLANT
GOWN STRL REUS W/ TWL XL LVL3 (GOWN DISPOSABLE) ×1 IMPLANT
GOWN STRL REUS W/TWL LRG LVL3 (GOWN DISPOSABLE) ×4
GOWN STRL REUS W/TWL XL LVL3 (GOWN DISPOSABLE) ×2
KIT SHOULDER TRACTION (DRAPES) ×2 IMPLANT
KIT TURNOVER KIT B (KITS) ×2 IMPLANT
MANIFOLD NEPTUNE II (INSTRUMENTS) ×2 IMPLANT
NDL HYPO 25GX1X1/2 BEV (NEEDLE) ×1 IMPLANT
NDL SPNL 18GX3.5 QUINCKE PK (NEEDLE) ×1 IMPLANT
NEEDLE HYPO 25GX1X1/2 BEV (NEEDLE) ×2 IMPLANT
NEEDLE SPNL 18GX3.5 QUINCKE PK (NEEDLE) ×2 IMPLANT
NS IRRIG 1000ML POUR BTL (IV SOLUTION) ×2 IMPLANT
PACK SHOULDER (CUSTOM PROCEDURE TRAY) ×2 IMPLANT
PACK UNIVERSAL I (CUSTOM PROCEDURE TRAY) ×2 IMPLANT
PAD ABD 8X10 STRL (GAUZE/BANDAGES/DRESSINGS) ×2 IMPLANT
PAD ARMBOARD 7.5X6 YLW CONV (MISCELLANEOUS) ×4 IMPLANT
SLING ARM IMMOBILIZER LRG (SOFTGOODS) ×1 IMPLANT
SPONGE LAP 4X18 RFD (DISPOSABLE) ×2 IMPLANT
STRIP CLOSURE SKIN 1/2X4 (GAUZE/BANDAGES/DRESSINGS) ×1 IMPLANT
SUT MNCRL AB 3-0 PS2 27 (SUTURE) ×1 IMPLANT
SYR 50ML SLIP (SYRINGE) ×2 IMPLANT
SYR CONTROL 10ML LL (SYRINGE) ×2 IMPLANT
TAPE STRIPS DRAPE STRL (GAUZE/BANDAGES/DRESSINGS) ×2 IMPLANT
TOWEL GREEN STERILE (TOWEL DISPOSABLE) ×2 IMPLANT
TOWEL GREEN STERILE FF (TOWEL DISPOSABLE) ×2 IMPLANT
TUBE CONNECTING 12X1/4 (SUCTIONS) ×2 IMPLANT
TUBING ARTHROSCOPY IRRIG 16FT (MISCELLANEOUS) ×2 IMPLANT
WAND STAR VAC 90 (SURGICAL WAND) ×2 IMPLANT
WATER STERILE IRR 1000ML POUR (IV SOLUTION) ×2 IMPLANT

## 2020-11-18 NOTE — Plan of Care (Signed)
  Problem: Health Behavior/Discharge Planning: Goal: Ability to manage health-related needs will improve Outcome: Progressing   Problem: Clinical Measurements: Goal: Ability to maintain clinical measurements within normal limits will improve Outcome: Progressing Goal: Will remain free from infection Outcome: Progressing   

## 2020-11-18 NOTE — Progress Notes (Signed)
This chaplain responded to MD consult for prayer.  The Pt. is sitting up on the side of the bed anticipating today's afternoon surgery.    The Pt. is hopeful the surgery is the beginning of a better quality of life that will positively impact his faith, family, and career.  The Pt. accepted the chaplain invitation for prayer and F/U spiritual care.

## 2020-11-18 NOTE — Progress Notes (Signed)
PROGRESS NOTE    Logan [name]  YCX:[id number] DOB: 1971-07-04 DOA: 11/15/2020 PCP: Rudene Anda, MD   Chief Complain: Right shoulder pain, fever  Brief Narrative: Patient is a 50 year old male with history of insulin-dependent diabetes mellitus, diabetic neuropathy, hypertension, psoriasis who presented with new onset of right shoulder pain, swelling and fever.  Patient was seen by orthopedics as an outpatient who sent him to [facility] emergency department.  Arthrocentesis of the left shoulder was done in the emergency with no yield and patient was admitted to [facility] for further work-up.  Presentation he was febrile, had leukocytosis, elevated ESR.  Started on broad spectrum  antibiotics for suspicion of septic arthritis of left shoulder.MRI confirmed septic arthritis.  Orthopedics following,plan for orthopedic intervention today.  Assessment & Plan:   Active Problems:   Sepsis ([place])   Acute pain of left shoulder   Sepsis secondary to left shoulder septic arthritis: Presented with fever, leukocytosis.  Patient was having severe left shoulder pain, swelling. Patient was seen by orthopedics as an outpatient who sent him to [facility] emergency department.  Arthrocentesis of the left shoulder was done in the emergency with no yield and patient was admitted to [facility] for further work-up.    Continue vancomycin and cefepime.  Continue IV fluids.  Orthopedics following MRI showed glenohumeral septic arthritis and subacromial/subdeltoid septic bursitis,associated deltoid and subscapularis myositis. No evidence of Osteomyelitis. Orthopedics planning for arthroscopic irrigation and debridement of the left shouldere .will involve infectious disease when appropriate.  We will follow up culture report. Afebrile.  Still has persistent leukocytosis.  Insulin-dependent diabetes mellitus: Monitor blood sugars.  Continue sliding scale insulin for now.  Hypertension: Currently blood pressure stable without any  medications.  Continue to hold  Diabetic neuropathy: On gabapentin  Hyperlipidemia: On statin         DVT prophylaxis:Lovenox Code Status: Full Family Communication: None at bedside Status is: Inpatient  Remains inpatient appropriate because:Inpatient level of care appropriate due to severity of illness   Dispo: The patient is from: Home              Anticipated d/c is to: Home              Patient currently is not medically stable to d/c.   Difficult to place patient No   Consultants: Orthopedics  Procedures:None  Antimicrobials:  Anti-infectives (From admission, onward)   Start     Dose/Rate Route Frequency Ordered Stop   11/18/20 0830  ceFAZolin (ANCEF) IVPB 2g/100 mL premix        2 g 200 mL/hr over 30 Minutes Intravenous To Short Stay 11/18/20 0736 11/19/20 0830   11/16/20 0000  vancomycin (VANCOREADY) IVPB 750 mg/150 mL        750 mg 150 mL/hr over 60 Minutes Intravenous Every 12 hours 11/15/20 1538     11/15/20 1600  ceFEPIme (MAXIPIME) 2 g in sodium chloride 0.9 % 100 mL IVPB        2 g 200 mL/hr over 30 Minutes Intravenous Every 8 hours 11/15/20 1532     11/15/20 1600  metroNIDAZOLE (FLAGYL) IVPB 500 mg  Status:  Discontinued        500 mg 100 mL/hr over 60 Minutes Intravenous Every 8 hours 11/15/20 1532 11/16/20 0747   11/15/20 1530  ceFEPIme (MAXIPIME) 2 g in sodium chloride 0.9 % 100 mL IVPB  Status:  Discontinued        2 g 200 mL/hr over 30 Minutes Intravenous  Once 11/15/20 1528 11/15/20 1532   11/15/20 1530  metroNIDAZOLE (FLAGYL) IVPB 500 mg  Status:  Discontinued        500 mg 100 mL/hr over 60 Minutes Intravenous  Once 11/15/20 1528 11/15/20 1532   11/15/20 1530  vancomycin (VANCOCIN) IVPB 1000 mg/200 mL premix        1,000 mg 200 mL/hr over 60 Minutes Intravenous  Once 11/15/20 1528 11/15/20 1656      Subjective: Patient seen and examined at the bedside this morning.  Hemodynamically stable.  No new complaints.  Continues to complain of  pain on the left shoulder rating 7/10  Objective: Vitals:   11/17/20 0802 11/17/20 1700 11/17/20 2222 11/18/20 0408  BP: 135/80  (!) 158/98 (!) 142/92  Pulse: 87  88 (!) 103  Resp: _0 Temp: 98.4 F (36.9 C) 98.8 F (37.1 C) 98.4 F (36.9 C) 99.3 F (37.4 C)  TempSrc: Oral Oral Oral Oral  SpO2: 96%  97% 95%  Weight:      Height:       No intake or output data in the 24 hours ending 11/18/20 0739 Filed Weights   11/15/20 1227  Weight: 82.1 kg    Examination:  General exam: Overall comfortable, not in distress HEENT: PERRL Respiratory system:  no wheezes or crackles  Cardiovascular system: S1 & S2 heard, RRR.  Gastrointestinal system: Abdomen is nondistended, soft and nontender. Central nervous system: Alert and oriented Extremities: No edema, no clubbing ,no cyanosis, tenderness on the left shoulder Skin: No rashes, no ulcers,no icterus    Data Reviewed: I have personally reviewed following labs and imaging studies  CBC: Recent Labs  Lab 11/15/20 1326 11/16/20 0150 11/17/20 0241 11/18/20 0239  WBC 17.4* 15.1* 15.3* 14.6*  NEUTROABS 14.5*  --  11.3* 10.0*  HGB 13.5 12.4* 12.6* 13.3  HCT 40.4 37.8* 39.3 39.4  MCV 88.2 89.4 92.9 87.2  PLT 444* 446* 495* 585*   Basic Metabolic Panel: Recent Labs  Lab 11/15/20 1326 11/16/20 0150  NA 132* 133*  K 4.1 3.8  CL 99 105  CO2 19* 20*  GLUCOSE 236* 120*  BUN 19 19  CREATININE 0.90 0.95  CALCIUM 8.6* 8.2*   GFR: Estimated Creatinine Clearance: 90.7 mL/min (by C-G formula based on SCr of 0.95 mg/dL). Liver Function Tests: Recent Labs  Lab 11/15/20 1326  AST 43*  ALT 43  ALKPHOS 179*  BILITOT 1.1  PROT 7.4  ALBUMIN 3.0*   No results for input(s): LIPASE, AMYLASE in the last 168 hours. No results for input(s): AMMONIA in the last 168 hours. Coagulation Profile: Recent Labs  Lab 11/15/20 1326  INR 1.0   Cardiac Enzymes: No results for input(s): CKTOTAL, CKMB, CKMBINDEX, TROPONINI in the last  168 hours. BNP (last 3 results) No results for input(s): PROBNP in the last 8760 hours. HbA1C: Recent Labs    11/16/20 0150  HGBA1C 8.3*   CBG: Recent Labs  Lab 11/17/20 0603 11/17/20 1216 11/17/20 1803 11/17/20 2000 11/18/20 0620  GLUCAP 112* 94 104* 209* 142*   Lipid Profile: No results for input(s): CHOL, HDL, LDLCALC, TRIG, CHOLHDL, LDLDIRECT in the last 72 hours. Thyroid Function Tests: No results for input(s): TSH, T4TOTAL, FREET4, T3FREE, THYROIDAB in the last 72 hours. Anemia Panel: No results for input(s): VITAMINB12, FOLATE, FERRITIN, TIBC, IRON, RETICCTPCT in the last 72 hours. Sepsis Labs: Recent Labs  Lab 11/15/20 1326 11/15/20 1833  LATICACIDVEN 1.6 1.1    Recent Results (from the  past 240 hour(s))  Blood Culture (routine x 2)     Status: None (Preliminary result)   Collection Time: 11/15/20  1:36 PM   Specimen: BLOOD RIGHT FOREARM  Result Value Ref Range Status   Specimen Description   Final    BLOOD RIGHT FOREARM BLOOD Performed at [facility], [address] [place], [place] [address]    Special Requests   Final    Blood Culture results may not be optimal due to an excessive volume of blood received in culture bottles [place] Performed at [facility], [address], [place], [place] [address]    Culture   Final    NO GROWTH 2 DAYS Performed at [facility], [place] [address], [place], [place] [address]    Report Status PENDING  Incomplete  Blood Culture (routine x 2)     Status: None (Preliminary result)   Collection Time: 11/15/20  1:40 PM   Specimen: BLOOD LEFT HAND  Result Value Ref Range Status   Specimen Description   Final    BLOOD LEFT HAND BLOOD Performed at [facility], [address] [place], [place] [address]    Special Requests   Final    BOTTLES DRAWN AEROBIC AND ANAEROBIC Blood Culture results may not be optimal due to an excessive volume of blood received  in culture bottles Performed at [facility], [address] [place], [place] [address]    Culture   Final    NO GROWTH 2 DAYS Performed at [facility], [place] [address], [place], [place] [address]    Report Status PENDING  Incomplete  Resp Panel by RT-PCR (Flu A&B, Covid)     Status: None   Collection Time: 11/15/20  1:46 PM   Specimen: Nasopharyngeal(NP) swabs in vial transport medium  Result Value Ref Range Status   SARS Coronavirus 2 by RT PCR NEGATIVE NEGATIVE Final    Comment: (NOTE) SARS-CoV-2 target nucleic acids are NOT DETECTED.  The SARS-CoV-2 RNA is generally detectable in upper respiratory specimens during the acute phase of infection. The lowest concentration of SARS-CoV-2 viral copies this assay can detect is 138 copies/mL. A negative result does not preclude SARS-Cov-2 infection and should not be used as the sole basis for treatment or other patient management decisions. A negative result may occur with  improper specimen collection/handling, submission of specimen other than nasopharyngeal swab, presence of viral mutation(s) within the areas targeted by this assay, and inadequate number of viral copies(<138 copies/mL). A negative result must be combined with clinical observations, patient history, and epidemiological information. The expected result is Negative.  Fact Sheet for Patients:  EntrepreneurPulse.com.au  Fact Sheet for Healthcare Providers:  IncredibleEmployment.be  This test is no t yet approved or cleared by the [place] FDA and  has been authorized for detection and/or diagnosis of SARS-CoV-2 by FDA under an Emergency Use Authorization (EUA). This EUA will remain  in effect (meaning this test can be used) for the duration of the COVID-19 declaration under Section 564(b)(1) of the Act, 21 U.S.C.section 360bbb-3(b)(1), unless the authorization is terminated  or revoked sooner.        Influenza A by PCR NEGATIVE NEGATIVE Final   Influenza B by PCR NEGATIVE NEGATIVE Final    Comment: (NOTE) The Xpert Xpress SARS-CoV-2/FLU/RSV plus assay is intended as an aid in the diagnosis of influenza from Nasopharyngeal swab specimens and should  not be used as a sole basis for treatment. Nasal washings and aspirates are unacceptable for Xpert Xpress SARS-CoV-2/FLU/RSV testing.  Fact Sheet for Patients: EntrepreneurPulse.com.au  Fact Sheet for Healthcare Providers: IncredibleEmployment.be  This test is not yet approved or cleared by the [place] FDA and has been authorized for detection and/or diagnosis of SARS-CoV-2 by FDA under an Emergency Use Authorization (EUA). This EUA will remain in effect (meaning this test can be used) for the duration of the COVID-19 declaration under Section 564(b)(1) of the Act, 21 U.S.C. section 360bbb-3(b)(1), unless the authorization is terminated or revoked.  Performed at [facility], [address] [place], [place] [address]   Urine culture     Status: Abnormal   Collection Time: 11/15/20  2:12 PM   Specimen: Urine, Random  Result Value Ref Range Status   Specimen Description   Final    URINE, RANDOM Performed at [facility], [address] [place], [place] [address]    Special Requests   Final    NONE Performed at [facility], [address] [place], [place] [address]    Culture (A)  Final    <10,000 COLONIES/mL INSIGNIFICANT GROWTH Performed at [facility], [place] [address], [place], [place] [address]    Report Status 11/16/2020 FINAL  Final         Radiology Studies: MR SHOULDER LEFT WO CONTRAST  Result Date: 11/16/2020 CLINICAL DATA:  Fever.  Left shoulder pain. EXAM: MRI OF THE LEFT SHOULDER WITHOUT CONTRAST TECHNIQUE: Multiplanar, multisequence MR imaging of the shoulder was performed. No intravenous contrast was administered. COMPARISON:   Left shoulder x-rays from same day. FINDINGS: Rotator cuff: Intact rotator cuff. Severe supraspinatus tendinosis. Muscles: Patchy edema primarily involving the deltoid and subscapularis muscles. No muscle atrophy. Biceps long head: Intact and normally positioned. Severe intra-articular tendinosis. Acromioclavicular Joint: Mild arthropathy of the acromioclavicular joint. Type II acromion. Large amount of complex fluid involving the subacromial/subdeltoid bursa. Glenohumeral Joint: Moderate joint effusion.  No chondral defect. Labrum:  Large superior labral tear.  Remaining labrum is intact. Bones: No acute fracture or dislocation. No suspicious bone lesion. Other: Mild diffuse soft tissue swelling about the shoulder. IMPRESSION: 1. Findings most consistent with glenohumeral septic arthritis and subacromial/subdeltoid septic bursitis. 2. Associated deltoid and subscapularis myositis. No evidence of osteomyelitis. 3. Intact rotator cuff. Severe supraspinatus and intra-articular biceps tendinosis. 4. Large superior labral tear. Electronically Signed   By: Titus Dubin M.D.   On: 11/16/2020 14:03   DG Shoulder Left  Result Date: 11/16/2020 CLINICAL DATA:  One-week history of LEFT shoulder pain. No known injuries. EXAM: LEFT SHOULDER - 2+ VIEW COMPARISON:  11/11/2020. FINDINGS: No evidence of acute, subacute or healed fracture. Glenohumeral joint anatomically aligned with well-preserved joint space. Subacromial space well-preserved. Acromioclavicular joint anatomically aligned without significant degenerative changes. Well preserved bone mineral density. No intrinsic osseous abnormality. IMPRESSION: Normal examination. Electronically Signed   By: Evangeline Dakin M.D.   On: 11/16/2020 15:52        Scheduled Meds: . aspirin EC  81 mg Oral Daily  . atorvastatin  20 mg Oral q1800  . chlorhexidine  60 mL Topical Once  . enoxaparin (LOVENOX) injection  40 mg Subcutaneous Q24H  . gabapentin  300 mg Oral QHS   . insulin aspart  0-15 Units Subcutaneous TID WC  . polyethylene glycol  17 g Oral Daily  . povidone-iodine  2 application Topical Once  .  vitamin B-12  1,000 mcg Oral Daily   Continuous Infusions: .  ceFAZolin (ANCEF) IV    . ceFEPime (MAXIPIME) IV 2 g (11/18/20 0113)  . vancomycin 750 mg (11/18/20 0215)     LOS: 3 days    Time spent: 25 mins.More than 50% of that time was spent in counseling and/or coordination of care.      Shelly Coss, MD [facility] P4/06/2021, 7:39 AM

## 2020-11-18 NOTE — Op Note (Signed)
Date of Surgery: 11/18/2020  INDICATIONS: Logan [name] is a 50 y.o.-year-old male with a left glenohumeral joint septic arthritis;  Logan [name] did consent to Logan procedure after discussion of Logan risks and benefits.  PREOPERATIVE DIAGNOSIS:  1.  Left glenohumeral joint septic arthritis  POSTOPERATIVE DIAGNOSIS:  1.  Left glenohumeral joint septic arthritis 2.  Left glenoid labrum degenerative tear anterior and superior, posterior 3.  Left shoulder proximal biceps tendon tears 4.  Left shoulder adhesive capsulitis  PROCEDURE:  1. Left shoulder arthroscopic irrigation for infection of glenohumeral joint 2. Left shoulder arthroscopic extensive debridement of rotator interval, anterior labrum, superior labrum, and posterior labrum 3. Left shoulder arthroscopic biceps tenotomy  SURGEON: Logan [name], M.D.  ASSIST: None.  ANESTHESIA:  general  IV FLUIDS AND URINE: See anesthesia.  ESTIMATED BLOOD LOSS: 2 mL.  IMPLANTS: None   DRAINS: None  COMPLICATIONS: None.  DESCRIPTION OF PROCEDURE: Logan [name] was brought to Logan operating room and placed supine on Logan operating table.  Logan [name] had been signed prior to Logan procedure and this was documented. Logan [name] had Logan anesthesia placed by Logan anesthesiologist.  A time-out was performed to confirm that this was Logan correct [name], site, side and location. Logan [name] did receive antibiotics prior to Logan incision and was re-dosed during Logan procedure as needed at indicated intervals.  A tourniquet was not placed.  Following application of general anesthetic, Logan [name] was positioned in Logan right lateral decubitus position with Logan left upper extremity exposed.  Logan [name] had Logan operative extremity prepped and draped in Logan standard surgical fashion.   Inline traction was applied.  We began Logan procedure with diagnostic arthroscopy.  A posterior lateral viewing portal was established 2 cm distant and 1 cm medial to Logan posterior  lateral corner of Logan acromion.  On diagnostic arthroscopy we identified purulent material in Logan glenohumeral joint.  This had collected in Logan posterior axillary recess as well as Logan rotator interval space.  Logan entire capsule of Logan intra-articular joint was erythematous and friable.  There was extensive tearing noted of Logan anterior, superior labrum as well as posterior labrum.  Logan glenohumeral joint itself was without arthritis.  Logan long head of Logan biceps tendon was noted to be quite erythematous and there were longitudinal tears noted just off of Logan superior labrum.  No loose bodies were encountered.  Logan subscapularis was intact, supraspinatus intact, infraspinatus intact, teres minor intact.  Next, we established a mid glenoid working portal with spinal needle localization.  This was established just off of Logan superior border of Logan subscapularis.  Via spinal needle localization we identified working space..  We then introduced a metal cannula.  We then copiously irrigated Logan joint to lavage Logan infectious material.  We lavaged Logan joint with 6 L of normal saline.  Next, we moved to Logan arthroscopic biceps tenotomy.  We identified a torn proximal biceps tendon.  Utilizing Logan arthroscopic scissor we removed Logan long head of Logan biceps directly off of Logan superior labrum.  Lastly, we moved to Logan extensive debridement and arthroscopic fashion of Logan left glenohumeral joint.  Utilizing Logan motorized shaver as well as Logan radiofrequency wand, we extensively debrided Logan rotator interval from Logan superior border of Logan subscapularis to Logan leading edge of Logan supraspinatus.  We then debrided all unstable and flap tears of Logan superior, anterior, and posterior labrum.  Pictures were taken throughout Logan procedure.  Arthroscopic instruments were removed Logan shoulder.  We closed incisions with buried 3-0 Monocryl.  Logan arm was cleaned and dried Steri-Strips were applied as well as standard sterile  dressing.  Logan [name] was placed in a sling.  No noted intraoperative complications.  All counts were correct x2.  POSTOPERATIVE PLAN:  [name] will be weightbearing as tolerated to Logan left upper extremity.  Sling to be worn for comfort only.  Logan [name] may remove Logan sling after postoperative day 1 ad lib.  Logan [name] will return to Logan medicine service for perioperative management of his preoperative infection.  I will see him back in Logan office in 2 weeks for routine postoperative care and wound check.

## 2020-11-18 NOTE — Progress Notes (Signed)
Pharmacy Antibiotic Note  Logan KEARSE is a 50 y.o. male admitted on 11/15/2020 with septic shoulder. Patient is currently afibrile but WBC count is still elevated at 14.8.MRI showed glenohumeral septic arthritis and subacromial/subdeltoid septic bursitis,associated deltoid and subscapularis myositis. No OM. Pt currently in OR for washout. Pharmacy has been consulted for Vanc and Cefepime dosing.  Plan: Continue Vanc 750 mg q12h Continue Cefepime 2 gm q8h F/u OR cultures, narrow abx as able  Height: 5\' 5"  (165.1 cm) Weight: 82.1 kg (181 lb) IBW/kg (Calculated) : 61.5  Temp (24hrs), Avg:98.9 F (37.2 C), Min:98.4 F (36.9 C), Max:99.3 F (37.4 C)  Recent Labs  Lab 11/15/20 1326 11/15/20 1833 11/16/20 0150 11/17/20 0241 11/18/20 0239  WBC 17.4*  --  15.1* 15.3* 14.6*  CREATININE 0.90  --  0.95  --   --   LATICACIDVEN 1.6 1.1  --   --   --     Estimated Creatinine Clearance: 90.7 mL/min (by C-G formula based on SCr of 0.95 mg/dL).    Allergies  Allergen Reactions  . Penicillins Hives and Rash    Has patient had a PCN reaction causing immediate rash, facial/tongue/throat swelling, SOB or lightheadedness with hypotension: Yes Has patient had a PCN reaction causing severe rash involving mucus membranes or skin necrosis: No Has patient had a PCN reaction that required hospitalization No Has patient had a PCN reaction occurring within the last 10 years: No If all of the above answers are "NO", then may proceed with Cephalosporin use.     Antimicrobials this admission: Vanc 4/8>> Cefepime 4/8>> Flagyl 4/8 Cefazolin 4/11   Microbiology results: 4/8 UCx: insignificant growth, F 4/8 BCx x2: ngtdx3d 4/11 MRSA PCR: neg 4/11 S aureus: neg    Thank you for allowing pharmacy to be a part of this patient's care.  Horton Finer 11/18/2020 2:38 PM

## 2020-11-18 NOTE — Transfer of Care (Signed)
Immediate Anesthesia Transfer of Care Note  Patient: Logan [name]  Procedure(s) Performed: ARTHROSCOPIC INCISIONAL DRAINAGE WITH EXTENSIVE DEBRIDEMENT (Left Shoulder)  Patient Location: PACU  Anesthesia Type:General  Level of Consciousness: awake, alert  and oriented  Airway & Oxygen Therapy: Patient Spontanous Breathing and Patient connected to face mask oxygen  Post-op Assessment: Report given to RN and Post -op Vital signs reviewed and stable  Post vital signs: Reviewed and stable  Last Vitals:  Vitals Value Taken Time  BP 141/98 11/18/20 1531  Temp 36.7 C 11/18/20 1530  Pulse 96 11/18/20 1551  Resp 17 11/18/20 1551  SpO2 94 % 11/18/20 1551  Vitals shown include unvalidated device data.  Last Pain:  Vitals:   11/18/20 1530  TempSrc:   PainSc: Asleep      Patients Stated Pain Goal: 2 (38/45/36 4680)  Complications: No complications documented.

## 2020-11-18 NOTE — Anesthesia Postprocedure Evaluation (Signed)
Anesthesia Post Note  Patient: Logan [name]  Procedure(s) Performed: ARTHROSCOPIC INCISIONAL DRAINAGE WITH EXTENSIVE DEBRIDEMENT (Left Shoulder)     Patient location during evaluation: PACU Anesthesia Type: General Level of consciousness: awake Pain management: pain level controlled Vital Signs Assessment: post-procedure vital signs reviewed and stable Respiratory status: spontaneous breathing, nonlabored ventilation, respiratory function stable and patient connected to nasal cannula oxygen Cardiovascular status: blood pressure returned to baseline and stable Postop Assessment: no apparent nausea or vomiting Anesthetic complications: no   No complications documented.  Last Vitals:  Vitals:   11/18/20 1900 11/18/20 2054  BP: (!) 144/92 135/88  Pulse: 91 84  Resp: 17 20  Temp: 36.5 C 36.8 C  SpO2: 99% 95%    Last Pain:  Vitals:   11/18/20 2054  TempSrc: Oral  PainSc:                  Karyl Kinnier Aerie Donica

## 2020-11-18 NOTE — Anesthesia Procedure Notes (Signed)
Procedure Name: Intubation Date/Time: 11/18/2020 2:35 PM Performed by: Alain Marion, CRNA Pre-anesthesia Checklist: Patient identified, Emergency Drugs available, Suction available and Patient being monitored Patient Re-evaluated:Patient Re-evaluated prior to induction Oxygen Delivery Method: Circle System Utilized Preoxygenation: Pre-oxygenation with 100% oxygen Induction Type: IV induction Ventilation: Mask ventilation without difficulty Laryngoscope Size: Miller and 2 Grade View: Grade I Tube type: Oral Tube size: 7.5 mm Number of attempts: 1 Airway Equipment and Method: Stylet and Oral airway Placement Confirmation: ETT inserted through vocal cords under direct vision,  positive ETCO2 and breath sounds checked- equal and bilateral Secured at: 22 cm Tube secured with: Tape Dental Injury: Teeth and Oropharynx as per pre-operative assessment

## 2020-11-18 NOTE — Brief Op Note (Signed)
11/18/2020  3:27 PM  PATIENT:  Logan [name]  50 y.o. male  PRE-OPERATIVE DIAGNOSIS:  Infected Left Shoulder  POST-OPERATIVE DIAGNOSIS:  Infected Left Shoulder  PROCEDURE:  Procedure(s): ARTHROSCOPIC INCISIONAL DRAINAGE WITH EXTENSIVE DEBRIDEMENT (Left)  SURGEON:  Surgeon(s) and Role:    * Nicholes Stairs, MD - Primary  PHYSICIAN ASSISTANT: none  ASSISTANTS: none   ANESTHESIA:   general  EBL:  5 mL   BLOOD ADMINISTERED:none  DRAINS: none   LOCAL MEDICATIONS USED:  NONE  SPECIMEN:  No Specimen  DISPOSITION OF SPECIMEN:  N/A  COUNTS:  YES  TOURNIQUET:  * No tourniquets in log *  DICTATION: .Note written in EPIC  PLAN OF CARE: Admit to inpatient   PATIENT DISPOSITION:  PACU - hemodynamically stable.   Delay start of Pharmacological VTE agent (>24hrs) due to surgical blood loss or risk of bleeding: not applicable

## 2020-11-18 NOTE — Discharge Instructions (Signed)
Orthopedic surgery discharge instructions:  -Sling to be worn for comfort only.  May remove sling at any time for active range of motion as tolerated.  No lifting restrictions.  -Maintain postoperative bandages for 3 days.  You may remove these bandages on postoperative day #3.  At that time you can shower as able.  Do not submerge underwater.  -Apply ice liberally to the left shoulder for 30 minutes out of each hour you are able around-the-clock.  -Return to see Dr. Stann Mainland in the office in 2 weeks.

## 2020-11-18 NOTE — H&P (Signed)
H&P update  The surgical history has been reviewed and remains accurate without interval change.  The patient was re-examined and patient's physiologic condition has not changed significantly in the last 30 days. The condition still exists that makes this procedure necessary. The treatment plan remains the same, without new options for care.  No new pharmacological allergies or types of therapy has been initiated that would change the plan or the appropriateness of the plan.  The patient and/or family understand the potential benefits and risks.  Lyza Houseworth P. Stann Mainland, MD 11/18/2020 1:58 PM

## 2020-11-18 NOTE — Anesthesia Preprocedure Evaluation (Addendum)
Anesthesia Evaluation  Patient identified by MRN, date of birth, ID band Patient awake    Reviewed: Allergy & Precautions, NPO status , Patient's Chart, lab work & pertinent test results  History of Anesthesia Complications Negative for: history of anesthetic complications  Airway Mallampati: III  TM Distance: >3 FB Neck ROM: Full    Dental no notable dental hx.    Pulmonary    Pulmonary exam normal breath sounds clear to auscultation       Cardiovascular hypertension, Pt. on medications Normal cardiovascular exam Rhythm:Regular Rate:Normal  ECG: ST, rate 124   Neuro/Psych negative neurological ROS  negative psych ROS   GI/Hepatic negative GI ROS, Neg liver ROS,   Endo/Other  diabetes, Type 2, Insulin Dependent, Oral Hypoglycemic Agents  Renal/GU negative Renal ROS     Musculoskeletal   Abdominal   Peds  Hematology HLD   Anesthesia Other Findings Septic shoulder joint  Reproductive/Obstetrics                         Anesthesia Physical Anesthesia Plan  ASA: III  Anesthesia Plan: General   Post-op Pain Management:    Induction: Intravenous  PONV Risk Score and Plan: 2 and Ondansetron, Dexamethasone, Treatment may vary due to age or medical condition and Midazolam  Airway Management Planned: Oral ETT  Additional Equipment: None  Intra-op Plan:   Post-operative Plan: Extubation in OR  Informed Consent: I have reviewed the patients History and Physical, chart, labs and discussed the procedure including the risks, benefits and alternatives for the proposed anesthesia with the patient or authorized representative who has indicated his/her understanding and acceptance.     Dental advisory given  Plan Discussed with: CRNA  Anesthesia Plan Comments:       Anesthesia Quick Evaluation

## 2020-11-19 ENCOUNTER — Inpatient Hospital Stay: Payer: Self-pay

## 2020-11-19 ENCOUNTER — Encounter (HOSPITAL_COMMUNITY): Payer: Self-pay | Admitting: Orthopedic Surgery

## 2020-11-19 ENCOUNTER — Telehealth: Payer: Self-pay

## 2020-11-19 DIAGNOSIS — M009 Pyogenic arthritis, unspecified: Secondary | ICD-10-CM

## 2020-11-19 DIAGNOSIS — I10 Essential (primary) hypertension: Secondary | ICD-10-CM | POA: Diagnosis not present

## 2020-11-19 DIAGNOSIS — E119 Type 2 diabetes mellitus without complications: Secondary | ICD-10-CM | POA: Diagnosis not present

## 2020-11-19 LAB — CBC WITH DIFFERENTIAL/PLATELET
Abs Immature Granulocytes: 0.93 10*3/uL — ABNORMAL HIGH (ref 0.00–0.07)
Basophils Absolute: 0.1 10*3/uL (ref 0.0–0.1)
Basophils Relative: 1 %
Eosinophils Absolute: 0 10*3/uL (ref 0.0–0.5)
Eosinophils Relative: 0 %
HCT: 42.9 % (ref 39.0–52.0)
Hemoglobin: 14 g/dL (ref 13.0–17.0)
Immature Granulocytes: 6 %
Lymphocytes Relative: 8 %
Lymphs Abs: 1.2 10*3/uL (ref 0.7–4.0)
MCH: 29.2 pg (ref 26.0–34.0)
MCHC: 32.6 g/dL (ref 30.0–36.0)
MCV: 89.6 fL (ref 80.0–100.0)
Monocytes Absolute: 1.3 10*3/uL — ABNORMAL HIGH (ref 0.1–1.0)
Monocytes Relative: 9 %
Neutro Abs: 11.4 10*3/uL — ABNORMAL HIGH (ref 1.7–7.7)
Neutrophils Relative %: 76 %
Platelets: 361 10*3/uL (ref 150–400)
RBC: 4.79 MIL/uL (ref 4.22–5.81)
RDW: 13.8 % (ref 11.5–15.5)
WBC: 14.9 10*3/uL — ABNORMAL HIGH (ref 4.0–10.5)
nRBC: 0 % (ref 0.0–0.2)

## 2020-11-19 LAB — GLUCOSE, CAPILLARY
Glucose-Capillary: 161 mg/dL — ABNORMAL HIGH (ref 70–99)
Glucose-Capillary: 201 mg/dL — ABNORMAL HIGH (ref 70–99)
Glucose-Capillary: 101 mg/dL — ABNORMAL HIGH (ref 70–99)
Glucose-Capillary: 185 mg/dL — ABNORMAL HIGH (ref 70–99)

## 2020-11-19 LAB — VANCOMYCIN, TROUGH: Vancomycin Tr: 8 ug/mL — ABNORMAL LOW (ref 15–20)

## 2020-11-19 MED ORDER — METHOCARBAMOL 500 MG PO TABS
500.0000 mg | ORAL_TABLET | Freq: Four times a day (QID) | ORAL | Status: DC | PRN
  Administered 2020-11-19 – 2020-11-22 (×7): 500 mg via ORAL
  Filled 2020-11-19 (×7): qty 1

## 2020-11-19 MED ORDER — SODIUM CHLORIDE 0.9% FLUSH: 10.0000 mL | INTRAVENOUS | Status: DC | PRN

## 2020-11-19 MED ORDER — CHLORHEXIDINE GLUCONATE CLOTH 2 % EX PADS
6.0000 | MEDICATED_PAD | Freq: Every day | CUTANEOUS | Status: DC
  Administered 2020-11-19 – 2020-11-22 (×3): 6 via TOPICAL

## 2020-11-19 NOTE — Progress Notes (Signed)
PROGRESS NOTE    Logan [name]  WTU:[id number] DOB: [birth year] DOA: 11/15/2020 PCP: Rudene Anda, MD   Chief Complain: Right shoulder pain, fever  Brief Narrative: Patient is a 51 year old male with history of insulin-dependent diabetes mellitus, diabetic neuropathy, hypertension, psoriasis who presented with new onset of right shoulder pain, swelling and fever.  Patient was seen by orthopedics as an outpatient who sent him to [facility] emergency department.  Arthrocentesis of the left shoulder was done in the emergency with no yield and patient was admitted to [facility] for further work-up.  Presentation he was febrile, had leukocytosis, elevated ESR.  Started on broad spectrum  antibiotics for suspicion of septic arthritis of left shoulder.MRI confirmed septic arthritis.  Orthopedics following and he underwent debridement of the left shoulder on 11/18/2020.  ID consulted today for antibiotics recommendation.  Planning for 4 weeks course of IV vancomycin on discharge.Plan for picc line placement  Assessment & Plan:   Active Problems:   Sepsis ([place])   Acute pain of left shoulder   Sepsis secondary to left shoulder septic arthritis: Presented with fever, leukocytosis.  Patient was having severe left shoulder pain, swelling. Patient was seen by orthopedics as an outpatient who sent him to [facility] emergency department.  Arthrocentesis of the left shoulder was done in the emergency with no yield and patient was admitted to [facility] for further work-up.   Started on  vancomycin and cefepime.   MRI showed glenohumeral septic arthritis and subacromial/subdeltoid septic bursitis,associated deltoid and subscapularis myositis. No evidence of Osteomyelitis. Underwent  arthroscopic irrigation and debridement of the left shoulder on 11/18/20  He is afebrile but he still has mild leukocytosis.  ID consulted today for antibiotics recommendation.  Planning for 4 weeks course of IV vancomycin on discharge.Plan for picc  line placement We will consult PT and OT today.  Insulin-dependent diabetes mellitus: Monitor blood sugars.  Continue sliding scale insulin for now.  Hypertension: Currently blood pressure stable without any medications.  Continue to hold  Diabetic neuropathy: On gabapentin  Hyperlipidemia: On statin         DVT prophylaxis:Lovenox Code Status: Full Family Communication: None at bedside Status is: Inpatient  Remains inpatient appropriate because:Inpatient level of care appropriate due to severity of illness   Dispo: The patient is from: Home              Anticipated d/c is to: Home              Patient currently is not medically stable to d/c.   Difficult to place patient No Discharge planning likely tomorrow  Consultants: Orthopedics  Procedures:None  Antimicrobials:  Anti-infectives (From admission, onward)   Start     Dose/Rate Route Frequency Ordered Stop   11/18/20 0830  ceFAZolin (ANCEF) IVPB 2g/100 mL premix        2 g 200 mL/hr over 30 Minutes Intravenous To Short Stay 11/18/20 0736 11/18/20 1508   11/16/20 0000  vancomycin (VANCOREADY) IVPB 750 mg/150 mL        750 mg 150 mL/hr over 60 Minutes Intravenous Every 12 hours 11/15/20 1538     11/15/20 1600  ceFEPIme (MAXIPIME) 2 g in sodium chloride 0.9 % 100 mL IVPB  Status:  Discontinued        2 g 200 mL/hr over 30 Minutes Intravenous Every 8 hours 11/15/20 1532 11/19/20 1017   11/15/20 1600  metroNIDAZOLE (FLAGYL) IVPB 500 mg  Status:  Discontinued  500 mg 100 mL/hr over 60 Minutes Intravenous Every 8 hours 11/15/20 1532 11/16/20 0747   11/15/20 1530  ceFEPIme (MAXIPIME) 2 g in sodium chloride 0.9 % 100 mL IVPB  Status:  Discontinued        2 g 200 mL/hr over 30 Minutes Intravenous  Once 11/15/20 1528 11/15/20 1532   11/15/20 1530  metroNIDAZOLE (FLAGYL) IVPB 500 mg  Status:  Discontinued        500 mg 100 mL/hr over 60 Minutes Intravenous  Once 11/15/20 1528 11/15/20 1532   11/15/20 1530   vancomycin (VANCOCIN) IVPB 1000 mg/200 mL premix        1,000 mg 200 mL/hr over 60 Minutes Intravenous  Once 11/15/20 1528 11/15/20 1656      Subjective: Patient seen and examined the bedside this morning.  Hemodynamically stable and comfortable today.  His pain is significantly better today.  Denies any new complaints  Objective: Vitals:   11/18/20 1900 11/18/20 2054 11/19/20 0314 11/19/20 0842  BP: (!) 144/92 135/88 134/88 (!) 139/95  Pulse: 91 84 84 92  Resp: _0 Temp: 97.7 F (36.5 C) 98.2 F (36.8 C) 98.4 F (36.9 C) 98.4 F (36.9 C)  TempSrc: Oral Oral Oral Oral  SpO2: 99% 95% 95% 97%  Weight:      Height:        Intake/Output Summary (Last 24 hours) at 11/19/2020 1110 Last data filed at 11/19/2020 0900 Gross per 24 hour  Intake 1340 ml  Output 5 ml  Net 1335 ml   Filed Weights   11/15/20 1227  Weight: 82.1 kg    Examination:  General exam: Overall comfortable, not in distress HEENT: PERRL Respiratory system:  no wheezes or crackles  Cardiovascular system: S1 & S2 heard, RRR.  Gastrointestinal system: Abdomen is nondistended, soft and nontender. Central nervous system: Alert and oriented Extremities: No edema, no clubbing ,no cyanosis, sling on the left upper extremity Skin: No rashes, no ulcers,no icterus    Data Reviewed: I have personally reviewed following labs and imaging studies  CBC: Recent Labs  Lab 11/15/20 1326 11/16/20 0150 11/17/20 0241 11/18/20 0239 11/19/20 0217  WBC 17.4* 15.1* 15.3* 14.6* 14.9*  NEUTROABS 14.5*  --  11.3* 10.0* 11.4*  HGB 13.5 12.4* 12.6* 13.3 14.0  HCT 40.4 37.8* 39.3 39.4 42.9  MCV 88.2 89.4 92.9 87.2 89.6  PLT 444* 446* 495* 402* 532   Basic Metabolic Panel: Recent Labs  Lab 11/15/20 1326 11/16/20 0150  NA 132* 133*  K 4.1 3.8  CL 99 105  CO2 19* 20*  GLUCOSE 236* 120*  BUN 19 19  CREATININE 0.90 0.95  CALCIUM 8.6* 8.2*   GFR: Estimated Creatinine Clearance: 90.7 mL/min (by C-G formula  based on SCr of 0.95 mg/dL). Liver Function Tests: Recent Labs  Lab 11/15/20 1326  AST 43*  ALT 43  ALKPHOS 179*  BILITOT 1.1  PROT 7.4  ALBUMIN 3.0*   No results for input(s): LIPASE, AMYLASE in the last 168 hours. No results for input(s): AMMONIA in the last 168 hours. Coagulation Profile: Recent Labs  Lab 11/15/20 1326  INR 1.0   Cardiac Enzymes: No results for input(s): CKTOTAL, CKMB, CKMBINDEX, TROPONINI in the last 168 hours. BNP (last 3 results) No results for input(s): PROBNP in the last 8760 hours. HbA1C: No results for input(s): HGBA1C in the last 72 hours. CBG: Recent Labs  Lab 11/18/20 1418 11/18/20 1533 11/18/20 1707 11/18/20 2113 11/19/20 0631  GLUCAP 77 93  147* 239* 161*   Lipid Profile: No results for input(s): CHOL, HDL, LDLCALC, TRIG, CHOLHDL, LDLDIRECT in the last 72 hours. Thyroid Function Tests: No results for input(s): TSH, T4TOTAL, FREET4, T3FREE, THYROIDAB in the last 72 hours. Anemia Panel: No results for input(s): VITAMINB12, FOLATE, FERRITIN, TIBC, IRON, RETICCTPCT in the last 72 hours. Sepsis Labs: Recent Labs  Lab 11/15/20 1326 11/15/20 1833  LATICACIDVEN 1.6 1.1    Recent Results (from the past 240 hour(s))  Blood Culture (routine x 2)     Status: None (Preliminary result)   Collection Time: 11/15/20  1:36 PM   Specimen: BLOOD RIGHT FOREARM  Result Value Ref Range Status   Specimen Description   Final    BLOOD RIGHT FOREARM BLOOD Performed at [facility], [address] [place], [place] [address]    Special Requests   Final    Blood Culture results may not be optimal due to an excessive volume of blood received in culture bottles [place] Performed at [facility], [address], [place], [place] [address]    Culture   Final    NO GROWTH 4 DAYS Performed at [facility], [place] [address], [place], [place] [address]    Report Status PENDING  Incomplete  Blood  Culture (routine x 2)     Status: None (Preliminary result)   Collection Time: 11/15/20  1:40 PM   Specimen: BLOOD LEFT HAND  Result Value Ref Range Status   Specimen Description   Final    BLOOD LEFT HAND BLOOD Performed at [facility], [address] [place], [place] [address]    Special Requests   Final    BOTTLES DRAWN AEROBIC AND ANAEROBIC Blood Culture results may not be optimal due to an excessive volume of blood received in culture bottles Performed at [facility], [address] [place], [place] [address]    Culture   Final    NO GROWTH 4 DAYS Performed at [facility], [place] [address], [place], [place] [address]    Report Status PENDING  Incomplete  Resp Panel by RT-PCR (Flu A&B, Covid)     Status: None   Collection Time: 11/15/20  1:46 PM   Specimen: Nasopharyngeal(NP) swabs in vial transport medium  Result Value Ref Range Status   SARS Coronavirus 2 by RT PCR NEGATIVE NEGATIVE Final    Comment: (NOTE) SARS-CoV-2 target nucleic acids are NOT DETECTED.  The SARS-CoV-2 RNA is generally detectable in upper respiratory specimens during the acute phase of infection. The lowest concentration of SARS-CoV-2 viral copies this assay can detect is 138 copies/mL. A negative result does not preclude SARS-Cov-2 infection and should not be used as the sole basis for treatment or other patient management decisions. A negative result may occur with  improper specimen collection/handling, submission of specimen other than nasopharyngeal swab, presence of viral mutation(s) within the areas targeted by this assay, and inadequate number of viral copies(<138 copies/mL). A negative result must be combined with clinical observations, patient history, and epidemiological information. The expected result is Negative.  Fact Sheet for Patients:  EntrepreneurPulse.com.au  Fact Sheet for Healthcare Providers:   IncredibleEmployment.be  This test is no t yet approved or cleared by the [place] FDA and  has been authorized for detection and/or diagnosis of SARS-CoV-2 by FDA under an Emergency Use Authorization (EUA). This EUA will remain  in effect (meaning this test  can be used) for the duration of the COVID-19 declaration under Section 564(b)(1) of the Act, 21 U.S.C.section 360bbb-3(b)(1), unless the authorization is terminated  or revoked sooner.       Influenza A by PCR NEGATIVE NEGATIVE Final   Influenza B by PCR NEGATIVE NEGATIVE Final    Comment: (NOTE) The Xpert Xpress SARS-CoV-2/FLU/RSV plus assay is intended as an aid in the diagnosis of influenza from Nasopharyngeal swab specimens and should not be used as a sole basis for treatment. Nasal washings and aspirates are unacceptable for Xpert Xpress SARS-CoV-2/FLU/RSV testing.  Fact Sheet for Patients: EntrepreneurPulse.com.au  Fact Sheet for Healthcare Providers: IncredibleEmployment.be  This test is not yet approved or cleared by the [place] FDA and has been authorized for detection and/or diagnosis of SARS-CoV-2 by FDA under an Emergency Use Authorization (EUA). This EUA will remain in effect (meaning this test can be used) for the duration of the COVID-19 declaration under Section 564(b)(1) of the Act, 21 U.S.C. section 360bbb-3(b)(1), unless the authorization is terminated or revoked.  Performed at [facility], [address], [place], [place] [address]   Urine culture     Status: Abnormal   Collection Time: 11/15/20  2:12 PM   Specimen: Urine, Random  Result Value Ref Range Status   Specimen Description   Final    URINE, RANDOM Performed at [facility], [address] [place], [place] [address]    Special Requests   Final    NONE Performed at [facility], [address] [place], [place] [address]    Culture (A)   Final    <10,000 COLONIES/mL INSIGNIFICANT GROWTH Performed at [place] [facility], [place] [address] [place], [place] [address]    Report Status 11/16/2020 FINAL  Final  Surgical pcr screen     Status: None   Collection Time: 11/18/20 10:29 AM   Specimen: Nasal Mucosa; Nasal Swab  Result Value Ref Range Status   MRSA, PCR NEGATIVE NEGATIVE Final   Staphylococcus aureus NEGATIVE NEGATIVE Final    Comment: (NOTE) The Xpert SA Assay (FDA approved for NASAL specimens in patients 39 years of age and older), is one component of a comprehensive surveillance program. It is not intended to diagnose infection nor to guide or monitor treatment. Performed at [facility], [place] [address], [place], [place] [address]          Radiology Studies: [place] EKG SITE RITE  Result Date: 11/19/2020 If [facility] image not attached, placement could not be confirmed due to current cardiac rhythm.       Scheduled Meds: . aspirin EC  81 mg Oral Daily  . atorvastatin  20 mg Oral q1800  . docusate sodium  100 mg Oral BID  . enoxaparin (LOVENOX) injection  40 mg Subcutaneous Q24H  . gabapentin  300 mg Oral QHS  . insulin aspart  0-15 Units Subcutaneous TID WC  . polyethylene glycol  17 g Oral Daily  . vitamin B-12  1,000 mcg Oral Daily   Continuous Infusions: . vancomycin 750 mg (11/19/20 0041)     LOS: 4 days    Time spent: 25 mins.More than 50% of that time was spent in counseling and/or coordination of care.      Shelly Coss, MD [facility] P4/07/2021, 11:10 AM

## 2020-11-19 NOTE — Progress Notes (Signed)
ID PROGRESS NOTE  We will see patient for IV abtx for septic arthritis of shoulder. Plan for 4 wk of iv vancomycin. Will place picc line order  Caren Griffins B. [facility] Infectious [facility] [phone]

## 2020-11-19 NOTE — Progress Notes (Addendum)
PHARMACY CONSULT NOTE FOR:  OUTPATIENT  PARENTERAL ANTIBIOTIC THERAPY (OPAT)  Indication: Septic arthritis Regimen: Vancomycin 1,000mg  IV every 12 hours End date: 12/13/20  IV antibiotic discharge orders are pended. To discharging provider:  please sign these orders via discharge navigator,  Select New Orders & click on the button choice - Manage This Unsigned Work.     Thank you for allowing pharmacy to be a part of this patient's care.  Mercy Riding, PharmD PGY1 Acute Care Pharmacy Resident

## 2020-11-19 NOTE — Telephone Encounter (Signed)
RN spoke with patient, scheduled him for hospital follow up appointment with Dr. Tommy Medal on 12/19/20 at 9 am. Provided patient with office address and phone number. Patient verbalized understanding and has no further questions.   Beryle Flock, RN

## 2020-11-19 NOTE — Progress Notes (Signed)
   Subjective:  Patient reports pain as mild to moderate.  Patient reports 4/10 pain at rest, denies numbness or tingling currently but reports history of carpal tunnel. He denies fever or chills. He has stayed in his sling.   Objective:   VITALS:   Vitals:   11/18/20 1900 11/18/20 2054 11/19/20 0314 11/19/20 0842  BP: (!) 144/92 135/88 134/88 (!) 139/95  Pulse: 91 84 84 92  Resp: 17 20 20 16   Temp: 97.7 F (36.5 C) 98.2 F (36.8 C) 98.4 F (36.9 C) 98.4 F (36.9 C)  TempSrc: Oral Oral Oral Oral  SpO2: 99% 95% 95% 97%  Weight:      Height:        Left Upper Extremity:  Neurovascular intact Sensation intact distally Intact pulses distally Incision: dressing C/D/I Elbow ROM 0-130, Able to make a composite fist.  Pain with passive ROM up to 50 FF. Ext rotation 10.    Lab Results  Component Value Date   WBC 14.9 (H) 11/19/2020   HGB 14.0 11/19/2020   HCT 42.9 11/19/2020   MCV 89.6 11/19/2020   PLT 361 11/19/2020   BMET    Component Value Date/Time   NA 133 (L) 11/16/2020 0150   NA 139 02/23/2019 1054   K 3.8 11/16/2020 0150   CL 105 11/16/2020 0150   CO2 20 (L) 11/16/2020 0150   GLUCOSE 120 (H) 11/16/2020 0150   BUN 19 11/16/2020 0150   BUN 13 02/23/2019 1054   CREATININE 0.95 11/16/2020 0150   CREATININE 0.86 06/23/2016 1424   CALCIUM 8.2 (L) 11/16/2020 0150   GFRNONAA >60 11/16/2020 0150   GFRNONAA >89 06/23/2016 1424   GFRAA 113 02/23/2019 1054   GFRAA >89 06/23/2016 1424     Assessment/Plan: 1 Day Post-Op   Active Problems:   Sepsis ([place])   Acute pain of left shoulder   Up with therapy Advance diet    Weightbearing Status: WBAT, ROM as tolerated. Can discontinue sling  And only use as needed.  Change dressing as needed if saturated. Dressing can be removed in 2 days and can shower in 2 days.  Antibiotics per primary ( Vanc via PICC line) .  Follow up at [facility] in 2 weeks for wound check with Dr. Stann Mainland or [facility]  PA-C    Faythe Casa 11/19/2020, 12:55 PM  Jonelle Sidle PA-C  Physician Assistant with Dr. Rogers, [place]

## 2020-11-19 NOTE — Progress Notes (Signed)
Peripherally Inserted Central Catheter Placement  The IV Nurse has discussed with the patient and/or persons authorized to consent for the patient, the purpose of this procedure and the potential benefits and risks involved with this procedure.  The benefits include less needle sticks, lab draws from the catheter, and the patient may be discharged home with the catheter. Risks include, but not limited to, infection, bleeding, blood clot (thrombus formation), and puncture of an artery; nerve damage and irregular heartbeat and possibility to perform a PICC exchange if needed/ordered by physician.  Alternatives to this procedure were also discussed.  Bard Power PICC patient education guide, fact sheet on infection prevention and patient information card has been provided to patient /or left at bedside.    PICC Placement Documentation  PICC Single Lumen 55/01/58 Right Basilic 38 cm 0 cm (Active)  Indication for Insertion or Continuance of Line Prolonged intravenous therapies 11/19/20 1551  Exposed Catheter (cm) 0 cm 11/19/20 1551  Site Assessment Clean;Intact;Dry 11/19/20 1551  Line Status Flushed;Blood return noted 11/19/20 1551  Dressing Type Transparent 11/19/20 1551  Dressing Status Clean;Dry;Intact 11/19/20 1551  Antimicrobial disc in place? Yes 11/19/20 1551  Dressing Intervention New dressing;Other (Comment) 11/19/20 1551  Dressing Change Due 11/26/20 11/19/20 1551       Synthia Innocent 11/19/2020, 3:53 PM

## 2020-11-19 NOTE — Progress Notes (Signed)
Pt called this nurse and c/o left shoulder tightness from surgical site, assessed shoulder and dressing CDI, ,left upper extremity has good capillary refill  Non-pitting edema,,sling put back in place and ice pack provided. PA  Made aware of above and received order for prn robaxin .PRN robaxin given with good effect. No further complaints.

## 2020-11-19 NOTE — Consult Note (Addendum)
[facility]  Total days of antibiotics 5/vanco & cefepime               Reason for Consult:  Septic left shoulder    Referring Physician:  Tawanna Solo  Active Problems:   Sepsis ([place])   Acute pain of left shoulder    HPI: Logan [name] is a 50 y.o. male with hx of HTN, T2DM, admitted with acute onset of left shoulder pain on 4/8 increasing in intensity of pain over the previous 5 days with fevers as high as 103-105F. On admit, wbc 15.1, and temp of 101.55F. sed rate of 104. Mri showing findings c/w glenohumeral septic arthritis and subdeltoid septic bursitis with intact rotator cuff and large labral tear. He was started on vancomycin and cefepime. On 4/11, the patient underwent left shoulder arthroscopic irrigation and debridement. Unfortunately, no OR cultures sent. ID asked to weigh in on abtx recommendations. Blood cx on admit NGTD. He denied having hyperglycemia or fluctuations to his BS prior to admit. No recent shoulder injury. He denied recent dental work.  Past Medical History:  Diagnosis Date  . Diabetes mellitus    takes Janumet and Invokana daily  . Hernia, umbilical   . Hypertension    takes Lisinopril daily  . Plaque psoriasis   . Seasonal allergies    takes Claritin daily and uses Flonase daily    Allergies:  Allergies  Allergen Reactions  . Penicillins Hives and Rash    Red rashes when he was a teenager; tolerated cefazolin    MEDICATIONS: . aspirin EC  81 mg Oral Daily  . atorvastatin  20 mg Oral q1800  . docusate sodium  100 mg Oral BID  . enoxaparin (LOVENOX) injection  40 mg Subcutaneous Q24H  . gabapentin  300 mg Oral QHS  . insulin aspart  0-15 Units Subcutaneous TID WC  . polyethylene glycol  17 g Oral Daily  . vitamin B-12  1,000 mcg Oral Daily    Social History   Tobacco Use  . Smoking status: Never Smoker  . Smokeless tobacco: Never Used  Vaping Use  . Vaping Use: Never used  Substance Use Topics  . Alcohol use:  Yes    Comment: occ  . Drug use: No    Family History  Problem Relation Age of Onset  . Hypertension Mother   . Diverticulosis Mother   . Osteoporosis Mother   . Colon polyps Mother   . Diabetes Father   . Alcohol abuse Father   . Osteoporosis Sister   . Hypertension Sister   . Diabetes Brother   . Hypertension Brother   . Colon polyps Brother   . Hypertension Maternal Grandmother   . Heart disease Maternal Grandfather   . Diabetes Paternal Grandfather   . Diabetes Brother   . Hypertension Brother   . Heart attack Brother   . Osteoporosis Sister   . Colon cancer Neg Hx   . Esophageal cancer Neg Hx   . Stomach cancer Neg Hx   . Rectal cancer Neg Hx     Review of Systems  Constitutional: Negative for fever, chills, diaphoresis, activity change, appetite change, fatigue and unexpected weight change.  HENT: Negative for congestion, sore throat, rhinorrhea, sneezing, trouble swallowing and sinus pressure.  Eyes: Negative for photophobia and visual disturbance.  Respiratory: Negative for cough, chest tightness, shortness of breath, wheezing and stridor.  Cardiovascular: Negative for chest pain, palpitations and leg swelling.  Gastrointestinal: Negative for  nausea, vomiting, abdominal pain, diarrhea, constipation, blood in stool, abdominal distention and anal bleeding.  Genitourinary: Negative for dysuria, hematuria, flank pain and difficulty urinating.  Musculoskeletal: +left shoulder pain. Negative for myalgias, back pain, joint swelling, arthralgias and gait problem.  Skin: Negative for color change, pallor, rash and wound.  Neurological: Negative for dizziness, tremors, weakness and light-headedness.  Hematological: Negative for adenopathy. Does not bruise/bleed easily.  Psychiatric/Behavioral: Negative for behavioral problems, confusion, sleep disturbance, dysphoric mood, decreased concentration and agitation.     OBJECTIVE: Temp:  [97.4 F (36.3 C)-98.4 F (36.9 C)]  98.4 F (36.9 C) (04/12 0842) Pulse Rate:  [84-95] 92 (04/12 0842) Resp:  [16-21] 16 (04/12 0842) BP: (134-144)/(82-98) 139/95 (04/12 0842) SpO2:  [94 %-100 %] 97 % (04/12 0842) Physical Exam  Constitutional: He is oriented to person, place, and time. He appears well-developed and well-nourished. No distress.  HENT:  Mouth/Throat: Oropharynx is clear and moist. No oropharyngeal exudate.  Cardiovascular: Normal rate, regular rhythm and normal heart sounds. Exam reveals no gallop and no friction rub.  No murmur heard.  Pulmonary/Chest: Effort normal and breath sounds normal. No respiratory distress. He has no wheezes.  Abdominal: Soft. Bowel sounds are normal. He exhibits no distension. There is no tenderness.  Lymphadenopathy:  He has no cervical adenopathy.  BJY:NWGN shoulder bandaged Neurological: He is alert and oriented to person, place, and time.  Skin: Skin is warm and dry. No rash noted. No erythema.  Psychiatric: He has a normal mood and affect. His behavior is normal.     LABS: Results for orders placed or performed during the hospital encounter of 11/15/20 (from the past 48 hour(s))  Glucose, capillary     Status: Abnormal   Collection Time: 11/17/20  6:03 PM  Result Value Ref Range   Glucose-Capillary 104 (H) 70 - 99 mg/dL    Comment: Glucose reference range applies only to samples taken after fasting for at least 8 hours.  Glucose, capillary     Status: Abnormal   Collection Time: 11/17/20  8:00 PM  Result Value Ref Range   Glucose-Capillary 209 (H) 70 - 99 mg/dL    Comment: Glucose reference range applies only to samples taken after fasting for at least 8 hours.  CBC with Differential/Platelet     Status: Abnormal   Collection Time: 11/18/20  2:39 AM  Result Value Ref Range   WBC 14.6 (H) 4.0 - 10.5 K/uL   RBC 4.52 4.22 - 5.81 MIL/uL   Hemoglobin 13.3 13.0 - 17.0 g/dL   HCT 39.4 39.0 - 52.0 %   MCV 87.2 80.0 - 100.0 fL   MCH 29.4 26.0 - 34.0 pg   MCHC 33.8 30.0  - 36.0 g/dL   RDW 13.8 11.5 - 15.5 %   Platelets 402 (H) 150 - 400 K/uL   nRBC 0.0 0.0 - 0.2 %   Neutrophils Relative % 69 %   Neutro Abs 10.0 (H) 1.7 - 7.7 K/uL   Lymphocytes Relative 12 %   Lymphs Abs 1.8 0.7 - 4.0 K/uL   Monocytes Relative 12 %   Monocytes Absolute 1.8 (H) 0.1 - 1.0 K/uL   Eosinophils Relative 2 %   Eosinophils Absolute 0.4 0.0 - 0.5 K/uL   Basophils Relative 1 %   Basophils Absolute 0.1 0.0 - 0.1 K/uL   Immature Granulocytes 4 %   Abs Immature Granulocytes 0.56 (H) 0.00 - 0.07 K/uL    Comment: Performed at [facility], [address],  [place], [place] [address]  Glucose, capillary     Status: Abnormal   Collection Time: 11/18/20  6:20 AM  Result Value Ref Range   Glucose-Capillary 142 (H) 70 - 99 mg/dL    Comment: Glucose reference range applies only to samples taken after fasting for at least 8 hours.  Surgical pcr screen     Status: None   Collection Time: 11/18/20 10:29 AM   Specimen: Nasal Mucosa; Nasal Swab  Result Value Ref Range   MRSA, PCR NEGATIVE NEGATIVE   Staphylococcus aureus NEGATIVE NEGATIVE    Comment: (NOTE) The Xpert SA Assay (FDA approved for NASAL specimens in patients 41 years of age and older), is one component of a comprehensive surveillance program. It is not intended to diagnose infection nor to guide or monitor treatment. Performed at [facility], [place] [address] [place], [place] [address]   Glucose, capillary     Status: None   Collection Time: 11/18/20 11:13 AM  Result Value Ref Range   Glucose-Capillary 96 70 - 99 mg/dL    Comment: Glucose reference range applies only to samples taken after fasting for at least 8 hours.  Glucose, capillary     Status: None   Collection Time: 11/18/20  2:18 PM  Result Value Ref Range   Glucose-Capillary 77 70 - 99 mg/dL    Comment: Glucose reference range applies only to samples taken after fasting for at least 8 hours.  Glucose, capillary     Status: None   Collection Time:  11/18/20  3:33 PM  Result Value Ref Range   Glucose-Capillary 93 70 - 99 mg/dL    Comment: Glucose reference range applies only to samples taken after fasting for at least 8 hours.  Glucose, capillary     Status: Abnormal   Collection Time: 11/18/20  5:07 PM  Result Value Ref Range   Glucose-Capillary 147 (H) 70 - 99 mg/dL    Comment: Glucose reference range applies only to samples taken after fasting for at least 8 hours.  Glucose, capillary     Status: Abnormal   Collection Time: 11/18/20  9:13 PM  Result Value Ref Range   Glucose-Capillary 239 (H) 70 - 99 mg/dL    Comment: Glucose reference range applies only to samples taken after fasting for at least 8 hours.  CBC with Differential/Platelet     Status: Abnormal   Collection Time: 11/19/20  2:17 AM  Result Value Ref Range   WBC 14.9 (H) 4.0 - 10.5 K/uL   RBC 4.79 4.22 - 5.81 MIL/uL   Hemoglobin 14.0 13.0 - 17.0 g/dL   HCT 42.9 39.0 - 52.0 %   MCV 89.6 80.0 - 100.0 fL   MCH 29.2 26.0 - 34.0 pg   MCHC 32.6 30.0 - 36.0 g/dL   RDW 13.8 11.5 - 15.5 %   Platelets 361 150 - 400 K/uL   nRBC 0.0 0.0 - 0.2 %   Neutrophils Relative % 76 %   Neutro Abs 11.4 (H) 1.7 - 7.7 K/uL   Lymphocytes Relative 8 %   Lymphs Abs 1.2 0.7 - 4.0 K/uL   Monocytes Relative 9 %   Monocytes Absolute 1.3 (H) 0.1 - 1.0 K/uL   Eosinophils Relative 0 %   Eosinophils Absolute 0.0 0.0 - 0.5 K/uL   Basophils Relative 1 %   Basophils Absolute 0.1 0.0 - 0.1 K/uL   WBC Morphology MILD LEFT SHIFT (1-5% METAS, OCC MYELO, OCC BANDS)    Immature Granulocytes 6 %  Abs Immature Granulocytes 0.93 (H) 0.00 - 0.07 K/uL    Comment: Performed at [facility], [place] [address] [place], [place] [address]  Glucose, capillary     Status: Abnormal   Collection Time: 11/19/20  6:31 AM  Result Value Ref Range   Glucose-Capillary 161 (H) 70 - 99 mg/dL    Comment: Glucose reference range applies only to samples taken after fasting for at least 8 hours.  Glucose, capillary      Status: Abnormal   Collection Time: 11/19/20 12:03 PM  Result Value Ref Range   Glucose-Capillary 201 (H) 70 - 99 mg/dL    Comment: Glucose reference range applies only to samples taken after fasting for at least 8 hours.   Lab Results  Component Value Date   ESRSEDRATE 104 (H) 11/15/2020    MICRO: reviewed IMAGING: [place] EKG SITE RITE  Result Date: 11/19/2020 If Site Rite image not attached, placement could not be confirmed due to current cardiac rhythm.    Assessment/Plan:  50yo M with history of T2DM plus HTN admitted for acute left shoulder septic arthritis. No recent micro data to help with narrowing of abtx. Most common cause include strep species, CoNS, MRSA, and c. Acnes.  - will plan to treat with 4 wk of vancomycin, dosed according to trough and renal function - will place order for picc line and arrange for follow up in the ID clinic - patient had numerous questions regarding instructions for picc line and abtx infusion, and will do teaching for him by home health coordinator  T2DM = recommend to have good control of DM to help with healing process. He will reschedule his endocrinologist appointment  Septic arthritis = defer to dr rogers for limitations and recs of what he can with his left shoulder/dressing changes  Hypertension = continue home medications  Pain management = recommend to use tylenol rather than ibuprofen to spare kidneys ----------------------------------- Diagnosis: Left shoulder septic arthritis  Culture Result: negative  Allergies  Allergen Reactions  . Penicillins Hives and Rash    Red rashes when he was a teenager; tolerated cefazolin    OPAT Orders Discharge antibiotics to be given via PICC line Discharge antibiotics: vancomycin Per pharmacy protocol  Aim for Vancomycin trough 15-20 or AUC 400-550 (unless otherwise indicated) Duration: vancomycin End Date: 12/07/2020  [facility] Care Per Protocol:  Home health RN for IV  administration and teaching; PICC line care and labs.    Labs weekly while on IV antibiotics: __x CBC with differential __x BMP __x CRP _x_ ESR _x_ Vancomycin trough   _x_ Please pull PIC at completion of IV antibiotics   Fax weekly labs to [phone]  Clinic Follow Up Appt: In 4 wk at [facility]

## 2020-11-19 NOTE — Telephone Encounter (Signed)
-----   Message from Carlyle Basques, MD sent at 11/19/2020  1:09 PM EDT ----- Can we have him see me or someone else in 4 wk

## 2020-11-20 DIAGNOSIS — Z5181 Encounter for therapeutic drug level monitoring: Secondary | ICD-10-CM | POA: Diagnosis not present

## 2020-11-20 DIAGNOSIS — M25512 Pain in left shoulder: Secondary | ICD-10-CM | POA: Diagnosis not present

## 2020-11-20 DIAGNOSIS — M009 Pyogenic arthritis, unspecified: Secondary | ICD-10-CM | POA: Diagnosis not present

## 2020-11-20 LAB — BASIC METABOLIC PANEL
Anion gap: 9 (ref 5–15)
BUN: 13 mg/dL (ref 6–20)
CO2: 23 mmol/L (ref 22–32)
Calcium: 8.7 mg/dL — ABNORMAL LOW (ref 8.9–10.3)
Chloride: 99 mmol/L (ref 98–111)
Creatinine, Ser: 0.78 mg/dL (ref 0.61–1.24)
GFR, Estimated: >60 mL/min (ref >60)
Glucose, Bld: 296 mg/dL — ABNORMAL HIGH (ref 70–99)
Potassium: 4.5 mmol/L (ref 3.5–5.1)
Sodium: 131 mmol/L — ABNORMAL LOW (ref 135–145)

## 2020-11-20 LAB — CBC WITH DIFFERENTIAL/PLATELET
Abs Immature Granulocytes: 0.93 10*3/uL — ABNORMAL HIGH (ref 0.00–0.07)
Basophils Absolute: 0.2 10*3/uL — ABNORMAL HIGH (ref 0.0–0.1)
Basophils Relative: 1 %
Eosinophils Absolute: 0.3 10*3/uL (ref 0.0–0.5)
Eosinophils Relative: 2 %
HCT: 42.2 % (ref 39.0–52.0)
Hemoglobin: 13.7 g/dL (ref 13.0–17.0)
Immature Granulocytes: 6 %
Lymphocytes Relative: 18 %
Lymphs Abs: 2.7 10*3/uL (ref 0.7–4.0)
MCH: 29.4 pg (ref 26.0–34.0)
MCHC: 32.5 g/dL (ref 30.0–36.0)
MCV: 90.6 fL (ref 80.0–100.0)
Monocytes Absolute: 1.8 10*3/uL — ABNORMAL HIGH (ref 0.1–1.0)
Monocytes Relative: 12 %
Neutro Abs: 8.9 10*3/uL — ABNORMAL HIGH (ref 1.7–7.7)
Neutrophils Relative %: 61 %
Platelets: 448 10*3/uL — ABNORMAL HIGH (ref 150–400)
RBC: 4.66 MIL/uL (ref 4.22–5.81)
RDW: 13.9 % (ref 11.5–15.5)
WBC: 14.8 10*3/uL — ABNORMAL HIGH (ref 4.0–10.5)
nRBC: 0 % (ref 0.0–0.2)

## 2020-11-20 LAB — GLUCOSE, CAPILLARY
Glucose-Capillary: 138 mg/dL — ABNORMAL HIGH (ref 70–99)
Glucose-Capillary: 305 mg/dL — ABNORMAL HIGH (ref 70–99)
Glucose-Capillary: 97 mg/dL (ref 70–99)
Glucose-Capillary: 250 mg/dL — ABNORMAL HIGH (ref 70–99)

## 2020-11-20 LAB — CULTURE, BLOOD (ROUTINE X 2)
Culture: NO GROWTH
Culture: NO GROWTH

## 2020-11-20 LAB — VANCOMYCIN, PEAK: Vancomycin Pk: 39 ug/mL (ref 30–40)

## 2020-11-20 LAB — VANCOMYCIN, TROUGH: Vancomycin Tr: 6 ug/mL — ABNORMAL LOW (ref 15–20)

## 2020-11-20 MED ORDER — LISINOPRIL 10 MG PO TABS
10.0000 mg | ORAL_TABLET | Freq: Every day | ORAL | Status: DC
  Administered 2020-11-20 – 2020-11-22 (×3): 10 mg via ORAL
  Filled 2020-11-20 (×3): qty 1

## 2020-11-20 MED ORDER — IBUPROFEN 200 MG PO TABS: 600.0000 mg | ORAL_TABLET | Freq: Four times a day (QID) | ORAL | Status: DC | PRN

## 2020-11-20 MED ORDER — VANCOMYCIN HCL 1000 MG/200ML IV SOLN
1000.0000 mg | Freq: Two times a day (BID) | INTRAVENOUS | Status: DC
  Administered 2020-11-20 – 2020-11-22 (×4): 1000 mg via INTRAVENOUS
  Filled 2020-11-20 (×5): qty 200

## 2020-11-20 MED ORDER — HYDROMORPHONE HCL 1 MG/ML IJ SOLN
0.5000 mg | INTRAMUSCULAR | Status: DC | PRN
  Administered 2020-11-20 (×2): 0.5 mg via INTRAVENOUS
  Filled 2020-11-20 (×3): qty 0.5

## 2020-11-20 NOTE — Progress Notes (Addendum)
PROGRESS NOTE    Logan [name]  LTJ:[id number] DOB: 30-Nov-1970 DOA: 11/15/2020 PCP: Rudene Anda, MD   Chief Complain: Right shoulder pain, fever  Brief Narrative: Patient is a 50 year old male with history of insulin-dependent diabetes mellitus, diabetic neuropathy, hypertension, psoriasis who presented with new onset of right shoulder pain, swelling and fever.  Patient was seen by orthopedics as an outpatient who sent him to [facility] emergency department.  Arthrocentesis of the left shoulder was done in the emergency with no yield and patient was admitted to [facility] for further work-up.  On presentation he was febrile, had leukocytosis, elevated ESR.  Started on broad spectrum  antibiotics for suspicion of septic arthritis of left shoulder.MRI confirmed septic arthritis.  Orthopedics following and he underwent debridement of the left shoulder on 11/18/2020.  ID consulted  for antibiotics recommendation.  Planning for 4 weeks course of IV vancomycin on discharge.Underwent picc line placement.  Patient complained of severe pain on the operated side so discharge cannot be done today.  Possible discharge tomorrow after adequate pain control  Assessment & Plan:   Active Problems:   Sepsis ([place])   Acute pain of left shoulder   Sepsis secondary to left shoulder septic arthritis: Presented with fever, leukocytosis.  Patient was having severe left shoulder pain, swelling. Patient was seen by orthopedics as an outpatient who sent him to [facility] emergency department.  Arthrocentesis of the left shoulder was done in the emergency with no yield and patient was admitted to [facility] for further work-up.   Started on  vancomycin and cefepime initially.   MRI showed glenohumeral septic arthritis and subacromial/subdeltoid septic bursitis,associated deltoid and subscapularis myositis. No evidence of Osteomyelitis. Underwent  arthroscopic irrigation and debridement of the left shoulder on 11/18/20   ID consulted  for  antibiotics recommendation.  Planning for 4 weeks course of IV vancomycin on discharge.last day of antibiotics will be on 12/07/2020.  Underwent picc line placement. PT/OT did not recommend any follow-up Patient complains of severe pain today.  IV morphine changed to Dilaudid, continue Tylenol and Robaxin as needed.   Insulin-dependent diabetes mellitus: Monitor blood sugars.  Continue sliding scale insulin for now.  Hypertension: Hypertensive this morning.  Restarted home lisinopril  Diabetic neuropathy: On gabapentin  Hyperlipidemia: On statin         DVT prophylaxis:Lovenox Code Status: Full Family Communication: None at bedside Status is: Inpatient  Remains inpatient appropriate because:Inpatient level of care appropriate due to severity of illness   Dispo: The patient is from: Home              Anticipated d/c is to: Home              Patient currently is not medically stable to d/c.   Difficult to place patient No Discharge planning  Tomorrow, patient states he is not ready today because of severe left shoulder pain  Consultants: Orthopedics  Procedures:None  Antimicrobials:  Anti-infectives (From admission, onward)   Start     Dose/Rate Route Frequency Ordered Stop   11/20/20 1200  vancomycin (VANCOREADY) IVPB 1000 mg/200 mL        1,000 mg 200 mL/hr over 60 Minutes Intravenous Every 12 hours 11/20/20 0904     11/18/20 0830  ceFAZolin (ANCEF) IVPB 2g/100 mL premix        2 g 200 mL/hr over 30 Minutes Intravenous To Short Stay 11/18/20 0736 11/18/20 1508   11/16/20 0000  vancomycin (VANCOREADY) IVPB 750 mg/150 mL  Status:  Discontinued        750 mg 150 mL/hr over 60 Minutes Intravenous Every 12 hours 11/15/20 1538 11/20/20 0904   11/15/20 1600  ceFEPIme (MAXIPIME) 2 g in sodium chloride 0.9 % 100 mL IVPB  Status:  Discontinued        2 g 200 mL/hr over 30 Minutes Intravenous Every 8 hours 11/15/20 1532 11/19/20 1017   11/15/20 1600  metroNIDAZOLE (FLAGYL) IVPB  500 mg  Status:  Discontinued        500 mg 100 mL/hr over 60 Minutes Intravenous Every 8 hours 11/15/20 1532 11/16/20 0747   11/15/20 1530  ceFEPIme (MAXIPIME) 2 g in sodium chloride 0.9 % 100 mL IVPB  Status:  Discontinued        2 g 200 mL/hr over 30 Minutes Intravenous  Once 11/15/20 1528 11/15/20 1532   11/15/20 1530  metroNIDAZOLE (FLAGYL) IVPB 500 mg  Status:  Discontinued        500 mg 100 mL/hr over 60 Minutes Intravenous  Once 11/15/20 1528 11/15/20 1532   11/15/20 1530  vancomycin (VANCOCIN) IVPB 1000 mg/200 mL premix        1,000 mg 200 mL/hr over 60 Minutes Intravenous  Once 11/15/20 1528 11/15/20 1656      Subjective: Patient seen and examined the bedside this morning.  Hemodynamically stable.  Complains of severe pain on the left shoulder today.  Feels he cannot go home today because he lives alone  Objective: Vitals:   11/19/20 1300 11/19/20 2101 11/20/20 0415 11/20/20 0915  BP: 136/80 (!) 171/88 (!) 146/83 (!) 151/88  Pulse: 81 90 82 90  Resp: $Remo'17 16  17  'lJoGb$ Temp: 98.3 F (36.8 C) 99.3 F (37.4 C) 98.7 F (37.1 C) 98.8 F (37.1 C)  TempSrc: Oral Oral Oral Oral  SpO2: 99% 94% 97% 96%  Weight:   81.4 kg   Height:        Intake/Output Summary (Last 24 hours) at 11/20/2020 1219 Last data filed at 11/19/2020 1758 Gross per 24 hour  Intake 480 ml  Output --  Net 480 ml   Filed Weights   11/15/20 1227 11/20/20 0415  Weight: 82.1 kg 81.4 kg    Examination:  General exam: In mild to moderate distress due to pain on the left shoulder. HEENT: PERRL Respiratory system:  no wheezes or crackles  Cardiovascular system: S1 & S2 heard, RRR.  Gastrointestinal system: Abdomen is nondistended, soft and nontender. Central nervous system: Alert and oriented Extremities: No edema, no clubbing ,no cyanosis,dressing on the left shoulder Skin: No rashes, no ulcers,no icterus   Data Reviewed: I have personally reviewed following labs and imaging studies  CBC: Recent Labs   Lab 11/15/20 1326 11/16/20 0150 11/17/20 0241 11/18/20 0239 11/19/20 0217 11/20/20 0222  WBC 17.4* 15.1* 15.3* 14.6* 14.9* 14.8*  NEUTROABS 14.5*  --  11.3* 10.0* 11.4* 8.9*  HGB 13.5 12.4* 12.6* 13.3 14.0 13.7  HCT 40.4 37.8* 39.3 39.4 42.9 42.2  MCV 88.2 89.4 92.9 87.2 89.6 90.6  PLT 444* 446* 495* 402* 361 341*   Basic Metabolic Panel: Recent Labs  Lab 11/15/20 1326 11/16/20 0150  NA 132* 133*  K 4.1 3.8  CL 99 105  CO2 19* 20*  GLUCOSE 236* 120*  BUN 19 19  CREATININE 0.90 0.95  CALCIUM 8.6* 8.2*   GFR: Estimated Creatinine Clearance: 90.4 mL/min (by C-G formula based on SCr of 0.95 mg/dL). Liver Function Tests: Recent Labs  Lab 11/15/20 1326  AST 43*  ALT 43  ALKPHOS 179*  BILITOT 1.1  PROT 7.4  ALBUMIN 3.0*   No results for input(s): LIPASE, AMYLASE in the last 168 hours. No results for input(s): AMMONIA in the last 168 hours. Coagulation Profile: Recent Labs  Lab 11/15/20 1326  INR 1.0   Cardiac Enzymes: No results for input(s): CKTOTAL, CKMB, CKMBINDEX, TROPONINI in the last 168 hours. BNP (last 3 results) No results for input(s): PROBNP in the last 8760 hours. HbA1C: No results for input(s): HGBA1C in the last 72 hours. CBG: Recent Labs  Lab 11/19/20 1203 11/19/20 1637 11/19/20 2134 11/20/20 0624 11/20/20 1134  GLUCAP 201* 101* 185* 138* 305*   Lipid Profile: No results for input(s): CHOL, HDL, LDLCALC, TRIG, CHOLHDL, LDLDIRECT in the last 72 hours. Thyroid Function Tests: No results for input(s): TSH, T4TOTAL, FREET4, T3FREE, THYROIDAB in the last 72 hours. Anemia Panel: No results for input(s): VITAMINB12, FOLATE, FERRITIN, TIBC, IRON, RETICCTPCT in the last 72 hours. Sepsis Labs: Recent Labs  Lab 11/15/20 1326 11/15/20 1833  LATICACIDVEN 1.6 1.1    Recent Results (from the past 240 hour(s))  Blood Culture (routine x 2)     Status: None   Collection Time: 11/15/20  1:36 PM   Specimen: BLOOD RIGHT FOREARM  Result Value Ref  Range Status   Specimen Description   Final    BLOOD RIGHT FOREARM BLOOD Performed at [facility], [address] [place], [place] [address]    Special Requests   Final    Blood Culture results may not be optimal due to an excessive volume of blood received in culture bottles [place] Performed at [facility], [address], [place], [place] [address]    Culture   Final    NO GROWTH 5 DAYS Performed at [facility], [place] [address], [place], [place] [address]    Report Status 11/20/2020 FINAL  Final  Blood Culture (routine x 2)     Status: None   Collection Time: 11/15/20  1:40 PM   Specimen: BLOOD LEFT HAND  Result Value Ref Range Status   Specimen Description   Final    BLOOD LEFT HAND BLOOD Performed at [facility], [address] [place], [place] [address]    Special Requests   Final    BOTTLES DRAWN AEROBIC AND ANAEROBIC Blood Culture results may not be optimal due to an excessive volume of blood received in culture bottles Performed at [facility], [address] [place], [place] [address]    Culture   Final    NO GROWTH 5 DAYS Performed at [facility], [place] [address] [place], [place] [address]    Report Status 11/20/2020 FINAL  Final  Resp Panel by RT-PCR (Flu A&B, Covid)     Status: None   Collection Time: 11/15/20  1:46 PM   Specimen: Nasopharyngeal(NP) swabs in vial transport medium  Result Value Ref Range Status   SARS Coronavirus 2 by RT PCR NEGATIVE NEGATIVE Final    Comment: (NOTE) SARS-CoV-2 target nucleic acids are NOT DETECTED.  The SARS-CoV-2 RNA is generally detectable in upper respiratory specimens during the acute phase of infection. The lowest concentration of SARS-CoV-2 viral copies this assay can detect is 138 copies/mL. A negative result does not preclude SARS-Cov-2 infection and should not be used as the sole basis for treatment or other patient management  decisions. A negative result may occur with  improper specimen collection/handling, submission of specimen other than nasopharyngeal swab, presence of viral mutation(s) within the areas targeted by this assay, and inadequate number of viral copies(<138 copies/mL). A negative result must be combined with clinical observations, patient history, and epidemiological information. The expected result is Negative.  Fact Sheet for Patients:  EntrepreneurPulse.com.au  Fact Sheet for Healthcare Providers:  IncredibleEmployment.be  This test is no t yet approved or cleared by the [place] FDA and  has been authorized for detection and/or diagnosis of SARS-CoV-2 by FDA under an Emergency Use Authorization (EUA). This EUA will remain  in effect (meaning this test can be used) for the duration of the COVID-19 declaration under Section 564(b)(1) of the Act, 21 U.S.C.section 360bbb-3(b)(1), unless the authorization is terminated  or revoked sooner.       Influenza A by PCR NEGATIVE NEGATIVE Final   Influenza B by PCR NEGATIVE NEGATIVE Final    Comment: (NOTE) The Xpert Xpress SARS-CoV-2/FLU/RSV plus assay is intended as an aid in the diagnosis of influenza from Nasopharyngeal swab specimens and should not be used as a sole basis for treatment. Nasal washings and aspirates are unacceptable for Xpert Xpress SARS-CoV-2/FLU/RSV testing.  Fact Sheet for Patients: EntrepreneurPulse.com.au  Fact Sheet for Healthcare Providers: IncredibleEmployment.be  This test is not yet approved or cleared by the [place] FDA and has been authorized for detection and/or diagnosis of SARS-CoV-2 by FDA under an Emergency Use Authorization (EUA). This EUA will remain in effect (meaning this test can be used) for the duration of the COVID-19 declaration under Section 564(b)(1) of the Act, 21 U.S.C. section 360bbb-3(b)(1), unless the  authorization is terminated or revoked.  Performed at [facility], [address], [place], [place] [address]   Urine culture     Status: Abnormal   Collection Time: 11/15/20  2:12 PM   Specimen: Urine, Random  Result Value Ref Range Status   Specimen Description   Final    URINE, RANDOM Performed at [facility], [address] [place], [place] [address]    Special Requests   Final    NONE Performed at [facility], [address] [place], [place] [address]    Culture (A)  Final    <10,000 COLONIES/mL INSIGNIFICANT GROWTH Performed at [facility], [place] [address], [place], [place] [address]    Report Status 11/16/2020 FINAL  Final  Surgical pcr screen     Status: None   Collection Time: 11/18/20 10:29 AM   Specimen: Nasal Mucosa; Nasal Swab  Result Value Ref Range Status   MRSA, PCR NEGATIVE NEGATIVE Final   Staphylococcus aureus NEGATIVE NEGATIVE Final    Comment: (NOTE) The Xpert SA Assay (FDA approved for NASAL specimens in patients 80 years of age and older), is one component of a comprehensive surveillance program. It is not intended to diagnose infection nor to guide or monitor treatment. Performed at [facility], [place] [address] [place], [place] [address]          Radiology Studies: [place] EKG SITE RITE  Result Date: 11/19/2020 If [facility] image not attached, placement could not be confirmed due to current cardiac rhythm.       Scheduled Meds: . aspirin EC  81 mg Oral Daily  . atorvastatin  20 mg Oral q1800  . Chlorhexidine Gluconate Cloth  6 each Topical Daily  . docusate sodium  100 mg Oral BID  . enoxaparin (LOVENOX) injection  40 mg Subcutaneous Q24H  . gabapentin  300 mg Oral QHS  . insulin aspart  0-15 Units Subcutaneous TID WC  . polyethylene glycol  17 g Oral Daily  . vitamin B-12  1,000 mcg Oral Daily   Continuous Infusions: . vancomycin 1,000 mg (11/20/20 1216)     LOS: 5 days     Time spent: 25 mins.More than 50% of that time was spent in counseling and/or coordination of care.      Shelly Coss, MD [facility] P4/13/2022, 12:19 PM

## 2020-11-20 NOTE — Progress Notes (Signed)
    [facility] for Infectious Disease    Date of Admission:  11/15/2020   Total days of antibiotics 6           ID: Logan [name] is a 50 y.o. male with  Left septic shoulder, clinically (no cultures sent) Active Problems:   Sepsis ([place])   Acute pain of left shoulder    Subjective: Afebrile, still having significant pain to left shoulder, has questions to what activities he may do. Right arm picc line placed yesterday without difficulty  12 point ros is negative except for pain  Medications:  . aspirin EC  81 mg Oral Daily  . atorvastatin  20 mg Oral q1800  . Chlorhexidine Gluconate Cloth  6 each Topical Daily  . docusate sodium  100 mg Oral BID  . enoxaparin (LOVENOX) injection  40 mg Subcutaneous Q24H  . gabapentin  300 mg Oral QHS  . insulin aspart  0-15 Units Subcutaneous TID WC  . polyethylene glycol  17 g Oral Daily  . vitamin B-12  1,000 mcg Oral Daily    Objective: Vital signs in last 24 hours: Temp:  [98.3 F (36.8 C)-99.3 F (37.4 C)] 98.8 F (37.1 C) (04/13 0915) Pulse Rate:  [81-90] 90 (04/13 0915) Resp:  [16-17] 17 (04/13 0915) BP: (136-171)/(80-88) 151/88 (04/13 0915) SpO2:  [94 %-99 %] 96 % (04/13 0915) Weight:  [81.4 kg] 81.4 kg (04/13 0415)  Physical Exam  Constitutional: He is oriented to person, place, and time. He appears well-developed and well-nourished. No distress.  HENT:  Mouth/Throat: Oropharynx is clear and moist. No oropharyngeal exudate.  Cardiovascular: Normal rate, regular rhythm and normal heart sounds. Exam reveals no gallop and no friction rub.  No murmur heard.  Pulmonary/Chest: Effort normal and breath sounds normal. No respiratory distress. He has no wheezes.  Abdominal: Soft. Bowel sounds are normal. He exhibits no distension. There is no tenderness.  Ext: right picc line is c/d/i. Left shoulder wrapped/ c/d/i Neurological: He is alert and oriented to person, place, and time.  Skin: Skin is warm and dry. No rash noted. No  erythema.  Psychiatric: He has a normal mood and affect. His behavior is normal.     Lab Results Recent Labs    11/19/20 0217 11/20/20 0222  WBC 14.9* 14.8*  HGB 14.0 13.7  HCT 42.9 42.2   Lab Results  Component Value Date   ESRSEDRATE 104 (H) 11/15/2020    Microbiology: No cultures sent Studies/Results: [place] EKG SITE RITE  Result Date: 11/19/2020 If Site Rite image not attached, placement could not be confirmed due to current cardiac rhythm.    Assessment/Plan: 50yo M with septic left shoulder = plan to treat for minimum of 4 wk of IV vancomycin. May extended abtx course depending on how he is improving on follow up visit.   opat orders are in. Has follow up visit in ID clinic established  Therapeutic drug monitoring = we increased vancomycin dose to help for discharge  Pain management = defer to primary team for management of pain for patient. Avoid ibuprofen given vancomycin  [facility] for Infectious Diseases Cell: [phone] Pager: [phone]  11/20/2020, 11:48 AM

## 2020-11-20 NOTE — Plan of Care (Signed)

## 2020-11-20 NOTE — Evaluation (Signed)
Occupational Therapy Evaluation Patient Details Name: Logan [name] MRN: [id number] DOB: 06-18-1971 Today's Date: 11/20/2020    History of Present Illness Pt is a 50 y/o male admitted 4/11 with L shoulder pain. MRI showed glenohumeral septic arthritis and subacromial/subdeltoid septic bursitis,associated deltoid and subscapularis myositis. No evidence of Osteomyelitis. Now s/p arthroscopic I&D of the left shoulder on 11/18/20 Picc line placed and plan for 4 weeks of antibiotics. PMH includes: Diabetes mellitus, Hernia, umbilical, Hypertension, Plaque psoriasis, and Seasonal allergies.   Clinical Impression   Pt typically independent. 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 Today Logan [name] is mod I for ADL. This is reported by RN and NT and when OT entered he had just performed his own sponge bath and LB dressing. He was able to complete grooming, completed LB dressing, UB dressing. Pt was provided with shoulder handout and demo/verbal education on sling management (for comfort - don/doff, and adjustment) dressing (sequence, what types of clothes are easier) bathing (body mechanics, can shower 2 days post-op per ortho PA note) and exercises (per ortho PA note WBAT and ROM to tolerance - provided with hand, wrist, elbow handout, PROM shoulder handout, and pendulum handout) Educated Pt to perform in front of mirror for better visual feedback on whether he is moving his shoulder - or his body, so that he can perform ROM exercises properly. Pt asked about weights and OT educated that he is NOT to use weights until directed by MD at follow up- that the focus is to heal and rid body of infection right now.  Pt did need information repeated several times and Pt seemed anxious and perseverating on pain management throughout session. From and OT perspective. Pt is READY to dc, our education is complete. However, we will follow acutely until he is dc'ed to  ensure understanding of ROM exercises and reinforce compensatory strategies for ADL to maximize independence and safety as Pt is discharging alone.     Follow Up Recommendations  Follow surgeon's recommendation for DC plan and follow-up therapies;No OT follow up (can do exercises provided by OT acutely until follow up with ortho MD)    Equipment Recommendations  None recommended by OT    Recommendations for Other Services       Precautions / Restrictions Precautions Precautions: None Required Braces or Orthoses: Sling Restrictions Weight Bearing Restrictions: Yes LUE Weight Bearing: Weight bearing as tolerated Other Position/Activity Restrictions: ROM as tolerated      Mobility Bed Mobility Overal bed mobility: Independent                  Transfers Overall transfer level: Independent                    Balance Overall balance assessment: No apparent balance deficits (not formally assessed)                                         ADL either performed or assessed with clinical judgement   ADL Overall ADL's : Modified independent                                       General ADL Comments: Pt had just sponge bathed in the bathroom prior to OT  entering, he was able to perform grooming (including BUE tasks like opening toothpaste) BLE dressing, UB dressing. Pt was provided with shoulder handout for education with ADL. (education on sling, dressing, bathing, exercises)     Vision Baseline Vision/History: Wears glasses Patient Visual Report: No change from baseline Vision Assessment?: No apparent visual deficits     Perception     Praxis      Pertinent Vitals/Pain Pain Assessment: 0-10 Pain Score: 7  Pain Location: L shoulder Pain Descriptors / Indicators: Aching;Constant;Discomfort;Grimacing;Operative site guarding;Sore Pain Intervention(s): Monitored during session;Repositioned;Ice applied (Pt spoke with MD during  session about pain management protocol, OT educated on gentle ROM for edema management/pain management)     Hand Dominance Right   Extremity/Trunk Assessment Upper Extremity Assessment Upper Extremity Assessment: LUE deficits/detail LUE Deficits / Details: deficits as anticipated post-op - big bandage, grasp weak at baseline due to carpal tunnel (currently at baseline 4-/5) LUE Coordination: decreased gross motor   Lower Extremity Assessment Lower Extremity Assessment: Overall WFL for tasks assessed   Cervical / Trunk Assessment Cervical / Trunk Assessment: Normal   Communication Communication Communication: No difficulties   Cognition Arousal/Alertness: Awake/alert Behavior During Therapy: Anxious Overall Cognitive Status: Within Functional Limits for tasks assessed                                 General Comments: Pt perseverating on pain management being limiting factor. Required multiple affirmations about ROM and WBAT during session   General Comments  VSS on RA    Exercises Exercises: Shoulder Shoulder Exercises Pendulum Exercise: AROM;Left;10 reps;Seated Shoulder Flexion: AAROM;Left;10 reps;Seated (able to achieve 45 degrees today) Shoulder ABduction: AAROM;Left;10 reps;Seated (able to achieve 30 degrees today) Shoulder External Rotation: AAROM;Left;10 reps;Seated (able to achieve to neutral) Elbow Flexion: AROM;Left (WFL) Elbow Extension: AROM;Left [facility]) Wrist Flexion: AROM;Left [facility]) Wrist Extension: AROM;Left [facility]) Digit Composite Flexion: AROM;Left (at baseline) Composite Extension: AROM;Left (at baseline) Neck Flexion: AROM Neck Extension: AROM Neck Lateral Flexion - Right: AROM Neck Lateral Flexion - Left: AROM   Shoulder Instructions Shoulder Instructions Donning/doffing shirt without moving shoulder: Modified independent Method for sponge bathing under operated UE: Modified independent (educated to use pendulum position for pain  management) Donning/doffing sling/immobilizer: Modified independent (adjusted sling for Pt) Correct positioning of sling/immobilizer: Independent Pendulum exercises (written home exercise program): Independent ROM for elbow, wrist and digits of operated UE: Independent (full ROM at all these joints, grasp at baseline) Sling wearing schedule (on at all times/off for ADL's): Independent (for comfort) Proper positioning of operated UE when showering: Modified independent Positioning of UE while sleeping: Modified independent (educated in sling and sleeping in chair for COMFORT, also pillow support positioning)    Home Living Family/patient expects to be discharged to:: Private residence Living Arrangements: Alone (is typically care giver for mother, but she has gone to sister's house) Available Help at Discharge: Family;Available PRN/intermittently Type of Home: Other(Comment) (townhome) Home Access: Level entry     Home Layout: One level     Bathroom Shower/Tub: Teacher, early years/pre: Standard     Home Equipment: None          Prior Functioning/Environment Level of Independence: Independent        Comments: works in Programmer, applications, drives, 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 Typically caregiver for mother        OT Problem List: Decreased range of motion;Impaired UE functional use;Pain  OT Treatment/Interventions: Therapeutic exercise;Patient/family education    OT Goals(Current goals can be found in the care plan section) Acute Rehab OT Goals Patient Stated Goal: better pain management OT Goal Formulation: With patient Time For Goal Achievement: 12/04/20 Potential to Achieve Goals: Good ADL Goals Pt Will Perform Upper Body Dressing: Independently;sitting Pt/caregiver will Perform Home Exercise Program: Left upper extremity;Independently;With written HEP provided;Increased ROM  OT Frequency: Min 2X/week   Barriers to D/C:            Co-evaluation               AM-PAC OT "6 Clicks" Daily Activity     Outcome Measure Help from another person eating meals?: None Help from another person taking care of personal grooming?: None Help from another person toileting, which includes using toliet, bedpan, or urinal?: None Help from another person bathing (including washing, rinsing, drying)?: None Help from another person to put on and taking off regular upper body clothing?: None Help from another person to put on and taking off regular lower body clothing?: None 6 Click Score: 24   End of Session Equipment Utilized During Treatment:  (sling) Nurse Communication: Mobility status  Activity Tolerance: Patient tolerated treatment well Patient left: in chair;with call bell/phone within reach  OT Visit Diagnosis: Muscle weakness (generalized) (M62.81);Pain Pain - Right/Left: Left Pain - part of body: Shoulder                Time: 2426-8341 OT Time Calculation (min): 48 min Charges:  OT General Charges $OT Visit: 1 Visit OT Evaluation $OT Eval Low Complexity: 1 Low OT Treatments $Self Care/Home Management : 8-22 mins $Therapeutic Exercise: 8-22 mins  Jesse Sans OTR/L Acute Rehabilitation Services Pager: [phone] Office: [place] 11/20/2020, 10:45 AM

## 2020-11-20 NOTE — Progress Notes (Signed)
Pharmacy Antibiotic Note  Logan [name] is a 50 y.o. male admitted on 11/15/2020 with septic arthritis.  Pharmacy has been consulted for vancomycin dosing.  AUC therapeutic at 481 with VP 39 (drawn slightly early), VT 8 on vancomycin 750mg  every 12 hours. Although AUC is at goal, will increase dose slightly to target better Cmin. OPAT orders are entered and updated with stop date of 12/13/2020.  Plan: Increase vancomycin to 1,000mg  IV every 12 hours Follow kidney function, clinical progress  Height: 5\' 5"  (165.1 cm) Weight: 81.4 kg (179 lb 7.3 oz) IBW/kg (Calculated) : 61.5  Temp (24hrs), Avg:98.8 F (37.1 C), Min:98.3 F (36.8 C), Max:99.3 F (37.4 C)  Recent Labs  Lab 11/15/20 1326 11/15/20 1833 11/16/20 0150 11/17/20 0241 11/18/20 0239 11/19/20 0217 11/19/20 1122 11/20/20 0222  WBC 17.4*  --  15.1* 15.3* 14.6* 14.9*  --  14.8*  CREATININE 0.90  --  0.95  --   --   --   --   --   LATICACIDVEN 1.6 1.1  --   --   --   --   --   --   VANCOTROUGH  --   --   --   --   --   --  8*  --   VANCOPEAK  --   --   --   --   --   --   --  39    Estimated Creatinine Clearance: 90.4 mL/min (by C-G formula based on SCr of 0.95 mg/dL).    Allergies  Allergen Reactions  . Penicillins Hives and Rash    Red rashes when he was a teenager; tolerated cefazolin    Antimicrobials this admission: Vanc 4/8 >> Cefepime 4/8 >> 4/12 Flagyl 4/8 x1  Microbiology results: 4/8 UCx: insignificant growth, F 4/8 BCx x2: ngtdx3d 4/11 MRSA PCR: neg 4/11 S aureus: neg  Thank you for allowing pharmacy to be a part of this patient's care.  Mercy Riding, PharmD PGY1 Acute Care Pharmacy Resident Please refer to [facility] for unit-specific pharmacist

## 2020-11-20 NOTE — Progress Notes (Signed)
PT Cancellation/Discharge Note  Patient Details Name: Logan [name] MRN: [id number] DOB: 08/29/1970   Cancelled Treatment:    Reason Eval/Treat Not Completed: PT screened, no needs identified, will sign off.  OT screened for PT needs.  No PT needs identified.  See OT notes for details on mobility/assessment.  PT to sign off.   Thanks,  Verdene Lennert, PT, DPT  Acute Rehabilitation [phone] pager #[phone] office       [name] 11/20/2020, 11:10 AM

## 2020-11-21 DIAGNOSIS — M00812 Arthritis due to other bacteria, left shoulder: Secondary | ICD-10-CM

## 2020-11-21 DIAGNOSIS — Z794 Long term (current) use of insulin: Secondary | ICD-10-CM

## 2020-11-21 LAB — GLUCOSE, CAPILLARY
Glucose-Capillary: 166 mg/dL — ABNORMAL HIGH (ref 70–99)
Glucose-Capillary: 247 mg/dL — ABNORMAL HIGH (ref 70–99)
Glucose-Capillary: 256 mg/dL — ABNORMAL HIGH (ref 70–99)
Glucose-Capillary: 213 mg/dL — ABNORMAL HIGH (ref 70–99)

## 2020-11-21 MED ORDER — INSULIN ASPART 100 UNIT/ML ~~LOC~~ SOLN
6.0000 [IU] | Freq: Three times a day (TID) | SUBCUTANEOUS | Status: DC
  Administered 2020-11-22 (×2): 6 [IU] via SUBCUTANEOUS

## 2020-11-21 MED ORDER — NAPROXEN 250 MG PO TABS: 500.0000 mg | ORAL_TABLET | Freq: Three times a day (TID) | ORAL | Status: DC | PRN

## 2020-11-21 MED ORDER — SENNOSIDES-DOCUSATE SODIUM 8.6-50 MG PO TABS
1.0000 | ORAL_TABLET | Freq: Two times a day (BID) | ORAL | Status: DC
  Administered 2020-11-21 – 2020-11-22 (×3): 1 via ORAL
  Filled 2020-11-21 (×3): qty 1

## 2020-11-21 MED ORDER — MELATONIN 5 MG PO TABS
5.0000 mg | ORAL_TABLET | Freq: Every evening | ORAL | Status: DC | PRN
  Administered 2020-11-22: 5 mg via ORAL
  Filled 2020-11-21: qty 1

## 2020-11-21 NOTE — Progress Notes (Signed)
PROGRESS NOTE    Logan [name]  RDE:[id number] DOB: 1971-07-16 DOA: 11/15/2020 PCP: Rudene Anda, MD   Chief Complain: Right shoulder pain, fever  Brief Narrative: Patient is a 50 year old male with history of insulin-dependent diabetes mellitus, diabetic neuropathy, hypertension, psoriasis who presented with new onset of right shoulder pain, swelling and fever.  Patient was seen by orthopedics as an outpatient who sent him to [facility] emergency department.  Arthrocentesis of the left shoulder was done in the emergency with no yield and patient was admitted to [facility] for further work-up.  On presentation he was febrile, had leukocytosis, elevated ESR.  Started on broad spectrum  antibiotics for suspicion of septic arthritis of left shoulder.MRI confirmed septic arthritis.  Orthopedics following and he underwent debridement of the left shoulder on 11/18/2020.  ID consulted  for antibiotics recommendation.  Planning for 4 weeks course of IV vancomycin on discharge.Underwent picc line placement.  Patient complained of severe pain on the operated side so discharge cannot be done today.  Possible discharge tomorrow after adequate pain control  Assessment & Plan:   Active Problems:   Sepsis ([place])   Acute pain of left shoulder   Sepsis secondary to left shoulder septic arthritis: Presented with fever, leukocytosis.  Patient was having severe left shoulder pain, swelling.  Patient was seen by orthopedics as an outpatient who sent him to [facility] emergency department.  Arthrocentesis of the left shoulder was done in the emergency with no yield and patient was admitted to [facility] for further work-up.   Started on  vancomycin and cefepime initially.   -MRI showed glenohumeral septic arthritis and subacromial/subdeltoid septic bursitis,associated deltoid and subscapularis myositis. No evidence of Osteomyelitis. -Underwent  arthroscopic irrigation and debridement of the left shoulder on 11/18/20 , I do not see  joint fluids culture -blood culture no growth - ID consulted  for antibiotics recommendation.  Planning for 4 weeks course of IV vancomycin on discharge.last day of antibiotics will be on 12/07/2020.  Underwent picc line placement. -PT/OT did not recommend any follow-up Patient complains of severe pain  Requiring iv Dilaudid, continue Tylenol and Robaxin as needed. Continue prn oxycodone, add on naproxen, discharge home if pain better controlled without needing iv dilaudid  Insulin-dependent diabetes mellitus: Uncontrolled ,A1c 8.3  monitor blood sugars.  Add meal coverage ,continue sliding scale insulin for now.  Hypertension: Blood pressure stable on home lisinopril  Diabetic neuropathy: On gabapentin  Hyperlipidemia: On statin  Sinus tachycardia, possibly due to pain, per chart review TSH unremarkable in 2018, pain control, follow-up with PCP         DVT prophylaxis:Lovenox Code Status: Full Family Communication: None at bedside Status is: Inpatient  Remains inpatient appropriate because:Inpatient level of care appropriate due to severity of illness   Dispo: The patient is from: Home              Anticipated d/c is to: Home              Patient currently is not medically stable to d/c.   Difficult to place patient No Discharge planning  Tomorrow, patient states he is not ready today because of severe left shoulder pain  Consultants: Orthopedics/ID  Procedures:  Left shoulder I&D on 4/11 by Dr. Stann Mainland PICC line placement on 4/12  Antimicrobials:  Anti-infectives (From admission, onward)   Start     Dose/Rate Route Frequency Ordered Stop   11/20/20 1200  vancomycin (VANCOREADY) IVPB 1000 mg/200 mL  1,000 mg 200 mL/hr over 60 Minutes Intravenous Every 12 hours 11/20/20 0904     11/18/20 0830  ceFAZolin (ANCEF) IVPB 2g/100 mL premix        2 g 200 mL/hr over 30 Minutes Intravenous To Short Stay 11/18/20 0736 11/18/20 1508   11/16/20 0000  vancomycin (VANCOREADY)  IVPB 750 mg/150 mL  Status:  Discontinued        750 mg 150 mL/hr over 60 Minutes Intravenous Every 12 hours 11/15/20 1538 11/20/20 0904   11/15/20 1600  ceFEPIme (MAXIPIME) 2 g in sodium chloride 0.9 % 100 mL IVPB  Status:  Discontinued        2 g 200 mL/hr over 30 Minutes Intravenous Every 8 hours 11/15/20 1532 11/19/20 1017   11/15/20 1600  metroNIDAZOLE (FLAGYL) IVPB 500 mg  Status:  Discontinued        500 mg 100 mL/hr over 60 Minutes Intravenous Every 8 hours 11/15/20 1532 11/16/20 0747   11/15/20 1530  ceFEPIme (MAXIPIME) 2 g in sodium chloride 0.9 % 100 mL IVPB  Status:  Discontinued        2 g 200 mL/hr over 30 Minutes Intravenous  Once 11/15/20 1528 11/15/20 1532   11/15/20 1530  metroNIDAZOLE (FLAGYL) IVPB 500 mg  Status:  Discontinued        500 mg 100 mL/hr over 60 Minutes Intravenous  Once 11/15/20 1528 11/15/20 1532   11/15/20 1530  vancomycin (VANCOCIN) IVPB 1000 mg/200 mL premix        1,000 mg 200 mL/hr over 60 Minutes Intravenous  Once 11/15/20 1528 11/15/20 1656      Subjective: Patient seen and examined the bedside this morning.  Hemodynamically stable.  Complains of severe pain on the left shoulder today.  Feels he cannot go home today because he lives alone Report being constipated  Objective: Vitals:   11/20/20 2033 11/21/20 0509 11/21/20 0900 11/21/20 0943  BP: (!) 154/79 (!) 155/82 (!) 137/94 (!) 137/94  Pulse: (!) 103 92 (!) 101   Resp: 18 16    Temp: 98.9 F (37.2 C) 99.3 F (37.4 C) 98.5 F (36.9 C)   TempSrc: Oral Oral Oral   SpO2: 95% 95% 97%   Weight:      Height:        Intake/Output Summary (Last 24 hours) at 11/21/2020 1621 Last data filed at 11/21/2020 0100 Gross per 24 hour  Intake 200 ml  Output 700 ml  Net -500 ml   Filed Weights   11/15/20 1227 11/20/20 0415  Weight: 82.1 kg 81.4 kg    Examination:  General exam: In mild to moderate distress due to pain on the left shoulder. HEENT: PERRL Respiratory system:  no wheezes or  crackles  Cardiovascular system: S1 & S2 heard, sinus tachycardia Gastrointestinal system: Abdomen is nondistended, soft and nontender. Central nervous system: Alert and oriented Extremities: No edema, no clubbing ,no cyanosis,dressing on the left shoulder, left shoulder limited range of motion Skin: No rashes, no ulcers,no icterus   Data Reviewed: I have personally reviewed following labs and imaging studies  CBC: Recent Labs  Lab 11/15/20 1326 11/16/20 0150 11/17/20 0241 11/18/20 0239 11/19/20 0217 11/20/20 0222  WBC 17.4* 15.1* 15.3* 14.6* 14.9* 14.8*  NEUTROABS 14.5*  --  11.3* 10.0* 11.4* 8.9*  HGB 13.5 12.4* 12.6* 13.3 14.0 13.7  HCT 40.4 37.8* 39.3 39.4 42.9 42.2  MCV 88.2 89.4 92.9 87.2 89.6 90.6  PLT 444* 446* 495* 402* 361 448*   Basic  Metabolic Panel: Recent Labs  Lab 11/15/20 1326 11/16/20 0150 11/20/20 1119  NA 132* 133* 131*  K 4.1 3.8 4.5  CL 99 105 99  CO2 19* 20* 23  GLUCOSE 236* 120* 296*  BUN $Re'19 19 13  'Ehc$ CREATININE 0.90 0.95 0.78  CALCIUM 8.6* 8.2* 8.7*   GFR: Estimated Creatinine Clearance: 107.4 mL/min (by C-G formula based on SCr of 0.78 mg/dL). Liver Function Tests: Recent Labs  Lab 11/15/20 1326  AST 43*  ALT 43  ALKPHOS 179*  BILITOT 1.1  PROT 7.4  ALBUMIN 3.0*   No results for input(s): LIPASE, AMYLASE in the last 168 hours. No results for input(s): AMMONIA in the last 168 hours. Coagulation Profile: Recent Labs  Lab 11/15/20 1326  INR 1.0   Cardiac Enzymes: No results for input(s): CKTOTAL, CKMB, CKMBINDEX, TROPONINI in the last 168 hours. BNP (last 3 results) No results for input(s): PROBNP in the last 8760 hours. HbA1C: No results for input(s): HGBA1C in the last 72 hours. CBG: Recent Labs  Lab 11/20/20 1134 11/20/20 1745 11/20/20 2033 11/21/20 0647 11/21/20 1203  GLUCAP 305* 97 250* 166* 247*   Lipid Profile: No results for input(s): CHOL, HDL, LDLCALC, TRIG, CHOLHDL, LDLDIRECT in the last 72 hours. Thyroid  Function Tests: No results for input(s): TSH, T4TOTAL, FREET4, T3FREE, THYROIDAB in the last 72 hours. Anemia Panel: No results for input(s): VITAMINB12, FOLATE, FERRITIN, TIBC, IRON, RETICCTPCT in the last 72 hours. Sepsis Labs: Recent Labs  Lab 11/15/20 1326 11/15/20 1833  LATICACIDVEN 1.6 1.1    Recent Results (from the past 240 hour(s))  Blood Culture (routine x 2)     Status: None   Collection Time: 11/15/20  1:36 PM   Specimen: BLOOD RIGHT FOREARM  Result Value Ref Range Status   Specimen Description   Final    BLOOD RIGHT FOREARM BLOOD Performed at [facility], [address] [place], [place] [address]    Special Requests   Final    Blood Culture results may not be optimal due to an excessive volume of blood received in culture bottles [place] Performed at [facility], [address] [place], [place] [address]    Culture   Final    NO GROWTH 5 DAYS Performed at [facility], [place] [address], [place], [place] [address]    Report Status 11/20/2020 FINAL  Final  Blood Culture (routine x 2)     Status: None   Collection Time: 11/15/20  1:40 PM   Specimen: BLOOD LEFT HAND  Result Value Ref Range Status   Specimen Description   Final    BLOOD LEFT HAND BLOOD Performed at [facility], [address] [place], [place] [address]    Special Requests   Final    BOTTLES DRAWN AEROBIC AND ANAEROBIC Blood Culture results may not be optimal due to an excessive volume of blood received in culture bottles Performed at [facility], [address] [place], [place] [address]    Culture   Final    NO GROWTH 5 DAYS Performed at [facility], [place] [address], [place], [place] [address]    Report Status 11/20/2020 FINAL  Final  Resp Panel by RT-PCR (Flu A&B, Covid)     Status: None   Collection Time: 11/15/20  1:46 PM   Specimen: Nasopharyngeal(NP) swabs in vial transport medium  Result Value Ref  Range Status  SARS Coronavirus 2 by RT PCR NEGATIVE NEGATIVE Final    Comment: (NOTE) SARS-CoV-2 target nucleic acids are NOT DETECTED.  The SARS-CoV-2 RNA is generally detectable in upper respiratory specimens during the acute phase of infection. The lowest concentration of SARS-CoV-2 viral copies this assay can detect is 138 copies/mL. A negative result does not preclude SARS-Cov-2 infection and should not be used as the sole basis for treatment or other patient management decisions. A negative result may occur with  improper specimen collection/handling, submission of specimen other than nasopharyngeal swab, presence of viral mutation(s) within the areas targeted by this assay, and inadequate number of viral copies(<138 copies/mL). A negative result must be combined with clinical observations, patient history, and epidemiological information. The expected result is Negative.  Fact Sheet for Patients:  EntrepreneurPulse.com.au  Fact Sheet for Healthcare Providers:  IncredibleEmployment.be  This test is no t yet approved or cleared by the [place] FDA and  has been authorized for detection and/or diagnosis of SARS-CoV-2 by FDA under an Emergency Use Authorization (EUA). This EUA will remain  in effect (meaning this test can be used) for the duration of the COVID-19 declaration under Section 564(b)(1) of the Act, 21 U.S.C.section 360bbb-3(b)(1), unless the authorization is terminated  or revoked sooner.       Influenza A by PCR NEGATIVE NEGATIVE Final   Influenza B by PCR NEGATIVE NEGATIVE Final    Comment: (NOTE) The Xpert Xpress SARS-CoV-2/FLU/RSV plus assay is intended as an aid in the diagnosis of influenza from Nasopharyngeal swab specimens and should not be used as a sole basis for treatment. Nasal washings and aspirates are unacceptable for Xpert Xpress SARS-CoV-2/FLU/RSV testing.  Fact Sheet for  Patients: EntrepreneurPulse.com.au  Fact Sheet for Healthcare Providers: IncredibleEmployment.be  This test is not yet approved or cleared by the [place] FDA and has been authorized for detection and/or diagnosis of SARS-CoV-2 by FDA under an Emergency Use Authorization (EUA). This EUA will remain in effect (meaning this test can be used) for the duration of the COVID-19 declaration under Section 564(b)(1) of the Act, 21 U.S.C. section 360bbb-3(b)(1), unless the authorization is terminated or revoked.  Performed at [facility], [address], [place], [place] [address]   Urine culture     Status: Abnormal   Collection Time: 11/15/20  2:12 PM   Specimen: Urine, Random  Result Value Ref Range Status   Specimen Description   Final    URINE, RANDOM Performed at [facility], [address] [place], [place] [address]    Special Requests   Final    NONE Performed at [facility], [address] [place], [place] [address]    Culture (A)  Final    <10,000 COLONIES/mL INSIGNIFICANT GROWTH Performed at [facility], [place] [address], [place], [place] [address]    Report Status 11/16/2020 FINAL  Final  Surgical pcr screen     Status: None   Collection Time: 11/18/20 10:29 AM   Specimen: Nasal Mucosa; Nasal Swab  Result Value Ref Range Status   MRSA, PCR NEGATIVE NEGATIVE Final   Staphylococcus aureus NEGATIVE NEGATIVE Final    Comment: (NOTE) The Xpert SA Assay (FDA approved for NASAL specimens in patients 77 years of age and older), is one component of a comprehensive surveillance program. It is not intended to diagnose infection nor to guide or monitor treatment. Performed at [facility], [place] [address] [place], [place] [address]  Radiology Studies: No results found.      Scheduled Meds: . aspirin EC  81 mg Oral Daily  . atorvastatin  20 mg Oral q1800  .  Chlorhexidine Gluconate Cloth  6 each Topical Daily  . docusate sodium  100 mg Oral BID  . enoxaparin (LOVENOX) injection  40 mg Subcutaneous Q24H  . gabapentin  300 mg Oral QHS  . insulin aspart  0-15 Units Subcutaneous TID WC  . lisinopril  10 mg Oral Daily  . polyethylene glycol  17 g Oral Daily  . senna-docusate  1 tablet Oral BID  . vitamin B-12  1,000 mcg Oral Daily   Continuous Infusions: . vancomycin 1,000 mg (11/21/20 1211)     LOS: 6 days    Time spent: 15 mins.More than 50% of that time was spent in counseling and/or coordination of care.      Florencia Reasons, MD PhD FACP [facility] P4/14/2022, 4:21 PM

## 2020-11-21 NOTE — TOC Progression Note (Addendum)
Transition of Care [facility]) - Progression Note    Patient Details  Name: Logan [name] MRN: [id number] Date of Birth: 1971/04/25  Transition of Care [facility]) CM/SW Contact  Sharin Mons, RN Phone Number: 11/21/2020, 7:12 PM  Clinical Narrative:    Pt presented with R shoulder pain, fever. Found to have septic arthritis per MRI. Pt with hx of diabetes mellitus, diabetic neuropathy, hypertension, psoriasis. From home alone. PTA states independent with ADL's, no MDE usage.        -s/p debridement of the left shoulder on 11/18/2020 Per ID  Pt will need 4 weeks of LT IV ABX therapy.  NCM spoke with pt @ bedside concerning disposition needs... home health services. Pt agreeable to [facility] services. Choice provided . Pt without preference. Referral made with [facility]/ Pam for IV ABX therapy and accepted. Referral made with [facility] and accepted  to provide [facility]., start of care 11/21/2020. Home infusion teaching completed by Osborne Casco with pt.  TOC team monitoring and will continue to assist with TOC needs....  Expected Discharge Plan: [place] Barriers to Discharge: Continued Medical Work up (pain issues)  Expected Discharge Plan and Services Expected Discharge Plan: [place]                         DME Arranged: Other see comment DME Agency: Other - Comment ([facility]) Date DME Agency Contacted: 11/21/20 Time DME Agency Contacted: [phone] Representative spoke with at DME Agency: Pam HH Arranged: RN [place] Agency: Other - See comment Date HH Agency Contacted: 11/21/20 ([place]) Time [place]: 1022     Social Determinants of Health (SDOH) Interventions    Readmission Risk Interventions No flowsheet data found.

## 2020-11-21 NOTE — Plan of Care (Signed)

## 2020-11-21 NOTE — Progress Notes (Signed)
Occupational Therapy Treatment Patient Details Name: Logan [name] MRN: [id number] DOB: [birth year] Today's Date: 11/21/2020    History of present illness Pt is a 50 y/o male admitted 4/11 with L shoulder pain. MRI showed glenohumeral septic arthritis and subacromial/subdeltoid septic bursitis,associated deltoid and subscapularis myositis. No evidence of Osteomyelitis. Now s/p arthroscopic I&D of the left shoulder on 11/18/20 Picc line placed and plan for 4 weeks of antibiotics. PMH includes: Diabetes mellitus, Hernia, umbilical, Hypertension, Plaque psoriasis, and Seasonal allergies.   OT comments  Pt making progress with functional goals. Session focused on ADL techniques and L shoulder ROM exercises as allowed by MD, shoulder protocol.   Follow Up Recommendations  Follow surgeon's recommendation for DC plan and follow-up therapies;No OT follow up    Equipment Recommendations  None recommended by OT    Recommendations for Other Services      Precautions / Restrictions Precautions Precautions: None Required Braces or Orthoses: Sling (for comfort) Restrictions Weight Bearing Restrictions: No LUE Weight Bearing: Weight bearing as tolerated Other Position/Activity Restrictions: ROM as tolerated       Mobility Bed Mobility Overal bed mobility: Independent                  Transfers Overall transfer level: Independent                    Balance Overall balance assessment: No apparent balance deficits (not formally assessed)                                         ADL either performed or assessed with clinical judgement   ADL Overall ADL's : Modified independent                                       General ADL Comments: reviewed compensatory UB ADL techniques     Vision Baseline Vision/History: Wears glasses Patient Visual Report: No change from baseline     Perception     Praxis      Cognition Arousal/Alertness:  Awake/alert Behavior During Therapy: WFL for tasks assessed/performed Overall Cognitive Status: Within Functional Limits for tasks assessed                                          Exercises Other Exercises Other Exercises: reviewed exercise handouts, shoulder prototcol handout. Pt able to verbalize and demo with supervision   Shoulder Instructions       General Comments      Pertinent Vitals/ Pain       Pain Assessment: Faces Faces Pain Scale: Hurts little more Pain Location: L shoulder Pain Descriptors / Indicators: Aching;Constant;Sore Pain Intervention(s): Monitored during session;Repositioned  Home Living                                          Prior Functioning/Environment              Frequency  Min 2X/week        Progress Toward Goals  OT Goals(current goals can now be found in the care plan section)  Progress towards OT goals:  Progressing toward goals  Acute Rehab OT Goals Patient Stated Goal: go home  Plan Discharge plan remains appropriate    Co-evaluation                 AM-PAC OT "6 Clicks" Daily Activity     Outcome Measure   Help from another person eating meals?: None Help from another person taking care of personal grooming?: None Help from another person toileting, which includes using toliet, bedpan, or urinal?: None Help from another person bathing (including washing, rinsing, drying)?: None Help from another person to put on and taking off regular upper body clothing?: None Help from another person to put on and taking off regular lower body clothing?: None 6 Click Score: 24    End of Session Equipment Utilized During Treatment: Other (comment) (sling for comfort)  OT Visit Diagnosis: Muscle weakness (generalized) (M62.81);Pain Pain - Right/Left: Left Pain - part of body: Shoulder   Activity Tolerance Patient tolerated treatment well   Patient Left in chair;with call bell/phone within  reach   Nurse Communication Mobility status        Time: 6073-7106 OT Time Calculation (min): 26 min  Charges: OT General Charges $OT Visit: 1 Visit OT Treatments $Self Care/Home Management : 8-22 mins $Therapeutic Exercise: 8-22 mins     Britt Bottom 11/21/2020, 3:35 PM

## 2020-11-22 ENCOUNTER — Other Ambulatory Visit: Payer: Self-pay | Admitting: Internal Medicine

## 2020-11-22 DIAGNOSIS — L409 Psoriasis, unspecified: Secondary | ICD-10-CM

## 2020-11-22 LAB — CBC WITH DIFFERENTIAL/PLATELET
Abs Immature Granulocytes: 0.89 10*3/uL — ABNORMAL HIGH (ref 0.00–0.07)
Basophils Absolute: 0.2 10*3/uL — ABNORMAL HIGH (ref 0.0–0.1)
Basophils Relative: 1 %
Eosinophils Absolute: 0.2 10*3/uL (ref 0.0–0.5)
Eosinophils Relative: 1 %
HCT: 44.2 % (ref 39.0–52.0)
Hemoglobin: 14.2 g/dL (ref 13.0–17.0)
Immature Granulocytes: 5 %
Lymphocytes Relative: 14 %
Lymphs Abs: 2.3 10*3/uL (ref 0.7–4.0)
MCH: 29.3 pg (ref 26.0–34.0)
MCHC: 32.1 g/dL (ref 30.0–36.0)
MCV: 91.3 fL (ref 80.0–100.0)
Monocytes Absolute: 1.7 10*3/uL — ABNORMAL HIGH (ref 0.1–1.0)
Monocytes Relative: 10 %
Neutro Abs: 11.2 10*3/uL — ABNORMAL HIGH (ref 1.7–7.7)
Neutrophils Relative %: 69 %
Platelets: 435 10*3/uL — ABNORMAL HIGH (ref 150–400)
RBC: 4.84 MIL/uL (ref 4.22–5.81)
RDW: 13.7 % (ref 11.5–15.5)
WBC: 16.5 10*3/uL — ABNORMAL HIGH (ref 4.0–10.5)
nRBC: 0 % (ref 0.0–0.2)

## 2020-11-22 LAB — GLUCOSE, CAPILLARY
Glucose-Capillary: 183 mg/dL — ABNORMAL HIGH (ref 70–99)
Glucose-Capillary: 343 mg/dL — ABNORMAL HIGH (ref 70–99)

## 2020-11-22 LAB — COMPREHENSIVE METABOLIC PANEL
ALT: 40 U/L (ref 0–44)
AST: 31 U/L (ref 15–41)
Albumin: 2.6 g/dL — ABNORMAL LOW (ref 3.5–5.0)
Alkaline Phosphatase: 165 U/L — ABNORMAL HIGH (ref 38–126)
Anion gap: 7 (ref 5–15)
BUN: 11 mg/dL (ref 6–20)
CO2: 26 mmol/L (ref 22–32)
Calcium: 8.9 mg/dL (ref 8.9–10.3)
Chloride: 101 mmol/L (ref 98–111)
Creatinine, Ser: 0.7 mg/dL (ref 0.61–1.24)
GFR, Estimated: >60 mL/min (ref >60)
Glucose, Bld: 161 mg/dL — ABNORMAL HIGH (ref 70–99)
Potassium: 4.3 mmol/L (ref 3.5–5.1)
Sodium: 134 mmol/L — ABNORMAL LOW (ref 135–145)
Total Bilirubin: 1.1 mg/dL (ref 0.3–1.2)
Total Protein: 7.5 g/dL (ref 6.5–8.1)

## 2020-11-22 LAB — MAGNESIUM: Magnesium: 2.1 mg/dL (ref 1.7–2.4)

## 2020-11-22 LAB — HEPATITIS PANEL, ACUTE
HCV Ab: NONREACTIVE
Hep A IgM: NONREACTIVE
Hep B C IgM: NONREACTIVE
Hepatitis B Surface Ag: NONREACTIVE

## 2020-11-22 MED ORDER — OXYCODONE HCL 10 MG PO TABS: 10.0000 mg | ORAL_TABLET | Freq: Four times a day (QID) | ORAL | 0 refills | Status: DC | PRN

## 2020-11-22 MED ORDER — ACETAMINOPHEN 325 MG PO TABS: 650.0000 mg | ORAL_TABLET | Freq: Four times a day (QID) | ORAL | Status: DC | PRN

## 2020-11-22 MED ORDER — HEPARIN SOD (PORK) LOCK FLUSH 100 UNIT/ML IV SOLN
250.0000 [IU] | INTRAVENOUS | Status: AC | PRN
  Administered 2020-11-22: 250 [IU]
  Filled 2020-11-22: qty 2.5

## 2020-11-22 MED ORDER — INSULIN PEN NEEDLE 32G X 4 MM MISC: 11 refills | Status: AC

## 2020-11-22 MED ORDER — GABAPENTIN 300 MG PO CAPS: 300.0000 mg | ORAL_CAPSULE | Freq: Every day | ORAL | 1 refills | Status: AC

## 2020-11-22 MED ORDER — POLYETHYLENE GLYCOL 3350 17 G PO PACK: 17.0000 g | PACK | Freq: Every day | ORAL | 0 refills | Status: AC

## 2020-11-22 MED ORDER — SENNOSIDES-DOCUSATE SODIUM 8.6-50 MG PO TABS: 1.0000 | ORAL_TABLET | Freq: Two times a day (BID) | ORAL | 0 refills | Status: DC

## 2020-11-22 MED ORDER — INSULIN ASPART 100 UNIT/ML FLEXPEN: PEN_INJECTOR | SUBCUTANEOUS | 1 refills | Status: AC

## 2020-11-22 MED ORDER — VANCOMYCIN IV (FOR PTA / DISCHARGE USE ONLY): 1000.0000 mg | Freq: Two times a day (BID) | INTRAVENOUS | 0 refills | Status: DC

## 2020-11-22 MED ORDER — VITAMIN B-12 1000 MCG PO TABS: 1000.0000 ug | ORAL_TABLET | Freq: Every day | ORAL | Status: AC

## 2020-11-22 MED ORDER — CYCLOBENZAPRINE HCL 5 MG PO TABS: 5.0000 mg | ORAL_TABLET | Freq: Three times a day (TID) | ORAL | 0 refills | Status: AC | PRN

## 2020-11-22 MED ORDER — MAGNESIUM HYDROXIDE 400 MG/5ML PO SUSP
30.0000 mL | Freq: Once | ORAL | Status: DC
  Filled 2020-11-22: qty 30

## 2020-11-22 MED ORDER — MELOXICAM 15 MG PO TABS: 15.0000 mg | ORAL_TABLET | Freq: Two times a day (BID) | ORAL | Status: DC | PRN

## 2020-11-22 NOTE — Progress Notes (Signed)
Patient discharging home with [facility] infusion services. Call Pam from [facility] infusion services to let them know patient is discharging. Discharge instructions given to the patient. All concerns addressed. Anxious to leave, called ride already. Misses the vanco dose and unwilling to wait for milk of magnesia to come from pharmacy. Requested insulin saying he does not have it at home. Printed prescription of insulin and vancomycin given.

## 2020-11-22 NOTE — Discharge Summary (Addendum)
Discharge Summary  Logan [name] ITG:[id number] DOB: Aug 01, 1971  PCP: Rudene Anda, MD  Admit date: 11/15/2020 Discharge date: 11/22/2020  Time spent: 16mns, more than 50% time spent on coordination of care.   Recommendations for Outpatient Follow-up:  1. F/u with PCP within a week  for hospital discharge follow up, repeat cbc/bmp at follow up. pcp to follow up on final hepatitis result 2. F/u with ortho Dr RStann Mainlandin two weeks 3. F/u with ID as scheduled  4. F/u with endocrinology for diabetes control 5. Home health arranged for IV abx infusion, last dose iv vanc on 5/6    Discharge Diagnoses:  Active Hospital Problems   Diagnosis Date Noted  . Sepsis ([place] 11/15/2020  . Acute pain of left shoulder     Resolved Hospital Problems  No resolved problems to display.    Discharge Condition: stable  Diet recommendation: heart healthy/carb modified  Filed Weights   11/15/20 1227 11/20/20 0415  Weight: 82.1 kg 81.4 kg    History of present illness: ( per admitting MD Dr PWynetta Fines Chief Complaint: Right shoulder pain, fever.  HPI: Logan TIMBERMANis a 50y.o. male with medical history significant of IDDM, diabetic neuropathy, HTN, psoriasis, presented with new onset of right shoulder pain swelling and fever.  Patient woke up Monday started to have acute left shoulder pain, throughout the day left shoulder started to swell up.  He went to urgent care, x-ray negative and Covid test negative.  And patient sent home. Next day, patient spiked fever of 104, he took some Tylenol and took cold shower and fever came down to 99.  He kept spiking fever in the next 3 days, T-max 105, but was able to bring down with Cozaar and Tylenol.  Today he went to sports medicine, [facility]  As per orthopedic surgery's request, ED physician performed a arthrocentesis of the left shoulder with no yield and [facility] ED Course: Spiking fever of  101.5, WBC 17, ESR more than 100, broad-spectrum antibiotics started in ED.  Hospital Course:  Active Problems:   Sepsis ([place]   Acute pain of left shoulder   Sepsis secondary to left shoulder septic arthritis:  -Presented with fever, leukocytosis.  Patient was having severe left shoulder pain, swelling.  -Patient was seen by [facility]opedics as an outpatient [facility]  Arthrocentesis of the left shoulder was done in the ER with no yield and [facility].   Started on  vancomycin and cefepime initially.   -MRI showed glenohumeral septic arthritis and subacromial/subdeltoid septic bursitis,associated deltoid and subscapularis myositis. No evidence of Osteomyelitis. -Underwent  arthroscopic irrigation and debridement of the left shoulder on 11/18/20 , I do not see joint fluids culture -blood culture no growth - ID consulted  for antibiotics recommendation.  Planning for 4 weeks course of IV vancomycin on discharge.last day of antibiotics will be on 12/13/2020.  Underwent picc line placement. -PT/OT did not recommend any follow-up - patient initially is reluctant to discharge home with uncontrolled pain, now pain is better controlled with oral meds, he is agreeable to go home with home health and continue home iv abx infusion -he is to follow up with ortho/ID and pcp  Ortho recommendation on 4/13:  -Weightbearing Status: WBAT, ROM as tolerated. Can discontinue sling  And only use as needed.  Change dressing as needed if saturated. Dressing  can be removed in 2 days and can shower in 2 days.  Antibiotics per primary ( Vanc via PICC line) .  [facility] at [facility] in 2 weeks for wound check with Dr. Stann Mainland or Jonelle Sidle PA-C  Insulin-dependent diabetes mellitus:  Uncontrolled ,A1c 8.3  He reports does not take insulin as more, he is on he is on Trulicity, Jardiance and metformin at home Home diabetes medication held since  admission, he received meal coverage and sliding scale insulin in the hospital Continue all home diabetes medications at discharge, he agreed to SSI insulin at home He is to follow-up with endocrinology Dr. Posey Pronto   Hypertension: Blood pressure stable on home lisinopril  Diabetic neuropathy: On gabapentin  Hyperlipidemia: On statin  Sinus tachycardia, possibly due to pain, per chart review TSH unremarkable in 2018, pain control, follow-up with PCP  h/o psoriasis , was on otezla for 7 months, but stop taking for 5-6 months He is to follow dermatology  Constipation: Stool softener started   DVT prophylaxis while in the hospital :Lovenox Code Status: Full   Dispo: The patient is from: Home alone  d/c is to: Home with home health   Consultants:  Orthopedics ID  Procedures:  Left shoulder I&D on 4/11 by Dr. Stann Mainland PICC line placement on 4/12  Antimicrobials:   Anti-infectives (From admission, onward)   Start     Dose/Rate Route Frequency Ordered Stop   11/22/20 0000  vancomycin IVPB        1,000 mg Intravenous Every 12 hours 11/22/20 1034 12/13/20 2359   11/20/20 1200  vancomycin (VANCOREADY) IVPB 1000 mg/200 mL        1,000 mg 200 mL/hr over 60 Minutes Intravenous Every 12 hours 11/20/20 0904     11/18/20 0830  ceFAZolin (ANCEF) IVPB 2g/100 mL premix        2 g 200 mL/hr over 30 Minutes Intravenous To Short Stay 11/18/20 0736 11/18/20 1508   11/16/20 0000  vancomycin (VANCOREADY) IVPB 750 mg/150 mL  Status:  Discontinued        750 mg 150 mL/hr over 60 Minutes Intravenous Every 12 hours 11/15/20 1538 11/20/20 0904   11/15/20 1600  ceFEPIme (MAXIPIME) 2 g in sodium chloride 0.9 % 100 mL IVPB  Status:  Discontinued        2 g 200 mL/hr over 30 Minutes Intravenous Every 8 hours 11/15/20 1532 11/19/20 1017   11/15/20 1600  metroNIDAZOLE (FLAGYL) IVPB 500 mg  Status:  Discontinued        500 mg 100 mL/hr over 60 Minutes Intravenous Every  8 hours 11/15/20 1532 11/16/20 0747   11/15/20 1530  ceFEPIme (MAXIPIME) 2 g in sodium chloride 0.9 % 100 mL IVPB  Status:  Discontinued        2 g 200 mL/hr over 30 Minutes Intravenous  Once 11/15/20 1528 11/15/20 1532   11/15/20 1530  metroNIDAZOLE (FLAGYL) IVPB 500 mg  Status:  Discontinued        500 mg 100 mL/hr over 60 Minutes Intravenous  Once 11/15/20 1528 11/15/20 1532   11/15/20 1530  vancomycin (VANCOCIN) IVPB 1000 mg/200 mL premix        1,000 mg 200 mL/hr over 60 Minutes Intravenous  Once 11/15/20 1528 11/15/20 1656      Discharge Exam: BP 137/83 (BP Location: Left Leg)   Pulse (!) 107   Temp 99.1 F (37.3 C) (Oral)   Resp 16   Ht _0  (1.651 m)   Wt  81.4 kg   SpO2 97%   BMI 29.86 kg/m   General: NAD Cardiovascular: RRR Respiratory: normal respiratory effort  Discharge Instructions You were cared for by a hospitalist during your hospital stay. If you have any questions about your discharge medications or the care you received while you were in the hospital after you are discharged, you can call the unit and asked to speak with the hospitalist on call if the hospitalist that took care of you is not available. Once you are discharged, your primary care physician will handle any further medical issues. Please note that NO REFILLS for any discharge medications will be authorized once you are discharged, as it is imperative that you return to your primary care physician (or establish a relationship with a primary care physician if you do not have one) for your aftercare needs so that they can reassess your need for medications and monitor your lab values.  Discharge Instructions    [facility] pharmacist to adjust dose for Vancomycin, Aminoglycosides and other anti-infective therapies as requested by physician.   Complete by: As directed    [facility] to provide Cath Flo 779m   Complete by: As directed    Administer for PICC line occlusion and as  ordered by physician for other access device issues.   Anaphylaxis Kit: Provided to treat any anaphylactic reaction to the medication being provided to the patient if First Dose or when requested by physician   Complete by: As directed    Epinephrine 167mml vial / amp: Administer 0.79m37m0.79ml74mubcutaneously once for moderate to severe anaphylaxis, nurse to call physician and pharmacy when reaction occurs and call 911 if needed for immediate care   Diphenhydramine 50mg68mIV vial: Administer 25-50mg 379mM PRN for first dose reaction, rash, itching, mild reaction, nurse to call physician and pharmacy when reaction occurs   Sodium Chloride 0.9% NS 500ml I84mdminister if needed for hypovolemic blood pressure drop or as ordered by physician after call to physician with anaphylactic reaction   Change dressing on IV access line weekly and PRN   Complete by: As directed    Diet - low sodium heart healthy   Complete by: As directed    Carb modified diet   Discharge wound care:   Complete by: As directed    Can shower start from 4/16, follow up with ortho in two weeks for wound check   Flush IV access with Sodium Chloride 0.9% and Heparin 10 units/ml or 100 units/ml   Complete by: As directed    Home infusion instructions - [facility]   Complete by: As directed    Instructions: Flush IV access with Sodium Chloride 0.9% and Heparin 10units/ml or 100units/ml   Change dressing on IV access line: Weekly and PRN   Instructions Cath Flo 2mg: Ad7mister for PICC Line occlusion and as ordered by physician for other access device   [facility] pharmacist to adjust dose for: Vancomycin, Aminoglycosides and other anti-infective therapies as requested by physician   Increase activity slowly   Complete by: As directed    Method of administration may be changed at the discretion of home infusion pharmacist based upon assessment of the patient and/or caregiver's ability to self-administer the  medication ordered   Complete by: As directed      Allergies as of 11/22/2020      Reactions   Penicillins Hives, Rash   Red rashes when he was a teenager; tolerated cefazolin  Medication List    TAKE these medications   acetaminophen 325 MG tablet Commonly known as: TYLENOL Take 2 tablets (650 mg total) by mouth every 6 (six) hours as needed for mild pain (or Fever >/= 101).   aspirin EC 81 MG tablet Take 81 mg by mouth every morning.   atorvastatin 20 MG tablet Commonly known as: LIPITOR TAKE 1 TABLET (20 MG TOTAL) BY MOUTH DAILY AT 6 PM.   Bayer Microlet Lancets lancets TEST UP TO 3 TIMES A DAY   blood glucose meter kit and supplies Dispense based on patient and insurance preference. Use up to 3 times daily, uncontrolled diabetes.  Contour Next meter with test strips and lancets.   CHROMIUM-CINNAMON PO Take 1 tablet by mouth 2 (two) times daily.   Clobetasol Propionate 0.05 % lotion Apply 1 application topically daily as needed (psoriasis).   Contour Next Test test strip Generic drug: glucose blood TEST UP TO 3 TIMES A DAY   cyclobenzaprine 5 MG tablet Commonly known as: FLEXERIL Take 1 tablet (5 mg total) by mouth 3 (three) times daily as needed for muscle spasms. What changed:   when to take this  reasons to take this   empagliflozin 25 MG Tabs tablet Commonly known as: JARDIANCE Take 25 mg by mouth every morning.   fluticasone 50 MCG/ACT nasal spray Commonly known as: FLONASE PLACE 1-2 SPRAYS INTO BOTH NOSTRILS DAILY. What changed:   when to take this  reasons to take this   gabapentin 300 MG capsule Commonly known as: NEURONTIN Take 1 capsule (300 mg total) by mouth at bedtime.   insulin aspart 100 UNIT/ML FlexPen Commonly known as: NOVOLOG Insulin sliding scale: Blood sugar  120-150   3units                       151-200   4units                       201-250   7units                       251- 300  11units                        301-350   15uints                       351-400   20units                       >400         call MD immediately What changed:   how much to take  how to take this  when to take this  additional instructions   Insulin Pen Needle 32G X 4 MM Misc 4x daily   lisinopril 10 MG tablet Commonly known as: ZESTRIL Take 1 tablet (10 mg total) by mouth daily.   loratadine 10 MG tablet Commonly known as: CLARITIN Take 10 mg by mouth daily as needed for allergies, rhinitis or itching.   meloxicam 15 MG tablet Commonly known as: MOBIC Take 1 tablet (15 mg total) by mouth 2 (two) times daily as needed for pain. What changed:   when to take this  reasons to take this   metFORMIN 1000 MG tablet Commonly known as: GLUCOPHAGE TAKE 1 TABLET (1,000 MG TOTAL) BY MOUTH 2 (TWO) TIMES DAILY WITH A MEAL.  multivitamin with minerals Tabs tablet Take 1 tablet by mouth every morning. Men's 50 plus   Oxycodone HCl 10 MG Tabs Take 1 tablet (10 mg total) by mouth every 6 (six) hours as needed for moderate pain.   polyethylene glycol 17 g packet Commonly known as: MIRALAX / GLYCOLAX Take 17 g by mouth daily. Start taking on: November 23, 2020   senna-docusate 8.6-50 MG tablet Commonly known as: Senokot-S Take 1 tablet by mouth 2 (two) times daily for 7 days.   Trulicity 3 AS/5.0NL Sopn Generic drug: Dulaglutide Inject 3 mg into the skin every Monday.   vancomycin  IVPB Inject 1,000 mg into the vein every 12 (twelve) hours for 21 days. Indication:  septic shoulder First Dose: Yes Last Day of Therapy:  12/13/2020 Labs - _0 /15/22 1034   11/22/20 0000  Discharge wound care:       Comments: Can shower start from 4/16, follow up with ortho in two weeks for wound check   11/22/20 1035         Allergies  Allergen Reactions  . Penicillins Hives and Rash    Red rashes when he was a teenager; tolerated cefazolin    Follow-up Information    Nicholes Stairs, MD In 2 weeks.   Specialty: Orthopedic Surgery Why: For wound re-check Contact information: [address] [place] [address] [phone]        Rudene Anda, MD Follow up in 1 week(s).   Specialty: Internal Medicine Why: hospital discharge follow up, repeat cbc/bmp Contact information: [address] [place]  [address] [phone]        Amalia Greenhouse, MD Follow up in 1 week(s).   Specialty: Endocrinology Why: for diabetes control  Contact information: [address] [place]  [address] [phone]                The results of significant diagnostics from this hospitalization (including imaging, microbiology, ancillary and laboratory) are listed below for reference.    Significant Diagnostic Studies: MR SHOULDER LEFT WO CONTRAST  Result Date: 11/16/2020 CLINICAL DATA:  Fever.  Left shoulder pain. EXAM: MRI OF THE LEFT SHOULDER WITHOUT CONTRAST TECHNIQUE: Multiplanar, multisequence MR imaging of the shoulder was performed. No intravenous contrast was administered. COMPARISON:  Left shoulder x-rays from same day. FINDINGS: Rotator cuff: Intact rotator cuff. Severe supraspinatus tendinosis. Muscles: Patchy edema primarily involving the deltoid and subscapularis muscles. No muscle atrophy.  Biceps long head: Intact and normally positioned. Severe intra-articular tendinosis. Acromioclavicular Joint: Mild arthropathy of the acromioclavicular joint. Type II acromion. Large amount of complex fluid involving the subacromial/subdeltoid bursa. Glenohumeral Joint: Moderate joint effusion.  No chondral defect. Labrum:  Large superior labral tear.  Remaining  labrum is intact. Bones: No acute fracture or dislocation. No suspicious bone lesion. Other: Mild diffuse soft tissue swelling about the shoulder. IMPRESSION: 1. Findings most consistent with glenohumeral septic arthritis and subacromial/subdeltoid septic bursitis. 2. Associated deltoid and subscapularis myositis. No evidence of osteomyelitis. 3. Intact rotator cuff. Severe supraspinatus and intra-articular biceps tendinosis. 4. Large superior labral tear. Electronically Signed   By: Titus Dubin M.D.   On: 11/16/2020 14:03   DG Chest Port 1 View  Result Date: 11/15/2020 CLINICAL DATA:  Sepsis. EXAM: PORTABLE CHEST 1 VIEW COMPARISON:  07/05/2017 FINDINGS: The cardiac silhouette, mediastinal and hilar contours are within normal limits. Low lung volumes with vascular crowding and streaky subsegmental bibasilar atelectasis. No infiltrates or effusions. The bony thorax is intact. IMPRESSION: Low lung volumes with vascular crowding and streaky subsegmental bibasilar atelectasis. Electronically Signed   By: Marijo Sanes M.D.   On: 11/15/2020 13:48   DG Shoulder Left  Result Date: 11/16/2020 CLINICAL DATA:  One-week history of LEFT shoulder pain. No known injuries. EXAM: LEFT SHOULDER - 2+ VIEW COMPARISON:  11/11/2020. FINDINGS: No evidence of acute, subacute or healed fracture. Glenohumeral joint anatomically aligned with well-preserved joint space. Subacromial space well-preserved. Acromioclavicular joint anatomically aligned without significant degenerative changes. Well preserved bone mineral density. No intrinsic osseous abnormality. IMPRESSION:  Normal examination. Electronically Signed   By: Evangeline Dakin M.D.   On: 11/16/2020 15:52   [place] EKG SITE RITE  Result Date: 11/19/2020 If Site Rite image not attached, placement could not be confirmed due to current cardiac rhythm.   Microbiology: Recent Results (from the past 240 hour(s))  Blood Culture (routine x 2)     Status: None   Collection Time: 11/15/20  1:36 PM   Specimen: BLOOD RIGHT FOREARM  Result Value Ref Range Status   Specimen Description   Final    BLOOD RIGHT FOREARM BLOOD Performed at [facility], [address] [place], [place] [address]    Special Requests   Final    Blood Culture results may not be optimal due to an excessive volume of blood received in culture bottles [place] Performed at [facility], [address], [place], [place] [address]    Culture   Final    NO GROWTH 5 DAYS Performed at [facility], [place] [address], [place], [place] [address]    Report Status 11/20/2020 FINAL  Final  Blood Culture (routine x 2)     Status: None   Collection Time: 11/15/20  1:40 PM   Specimen: BLOOD LEFT HAND  Result Value Ref Range Status   Specimen Description   Final    BLOOD LEFT HAND BLOOD Performed at [facility], [address] [place], [place] [address]    Special Requests   Final    BOTTLES DRAWN AEROBIC AND ANAEROBIC Blood Culture results may not be optimal due to an excessive volume of blood received in culture bottles Performed at [facility], [address] [place], [place] [address]    Culture   Final    NO GROWTH 5 DAYS Performed at [facility], [place] [address] [place], [place] [address]    Report Status 11/20/2020 FINAL  Final  Resp Panel by RT-PCR (Flu A&B, Covid)     Status: None   Collection Time: 11/15/20  1:46 PM   Specimen: Nasopharyngeal(NP) swabs in vial transport medium  Result Value Ref Range  Status   SARS Coronavirus 2 by RT PCR NEGATIVE  NEGATIVE Final    Comment: (NOTE) SARS-CoV-2 target nucleic acids are NOT DETECTED.  The SARS-CoV-2 RNA is generally detectable in upper respiratory specimens during the acute phase of infection. The lowest concentration of SARS-CoV-2 viral copies this assay can detect is 138 copies/mL. A negative result does not preclude SARS-Cov-2 infection and should not be used as the sole basis for treatment or other patient management decisions. A negative result may occur with  improper specimen collection/handling, submission of specimen other than nasopharyngeal swab, presence of viral mutation(s) within the areas targeted by this assay, and inadequate number of viral copies(<138 copies/mL). A negative result must be combined with clinical observations, patient history, and epidemiological information. The expected result is Negative.  Fact Sheet for Patients:  EntrepreneurPulse.com.au  Fact Sheet for Healthcare Providers:  IncredibleEmployment.be  This test is no t yet approved or cleared by the [place] FDA and  has been authorized for detection and/or diagnosis of SARS-CoV-2 by FDA under an Emergency Use Authorization (EUA). This EUA will remain  in effect (meaning this test can be used) for the duration of the COVID-19 declaration under Section 564(b)(1) of the Act, 21 U.S.C.section 360bbb-3(b)(1), unless the authorization is terminated  or revoked sooner.       Influenza A by PCR NEGATIVE NEGATIVE Final   Influenza B by PCR NEGATIVE NEGATIVE Final    Comment: (NOTE) The Xpert Xpress SARS-CoV-2/FLU/RSV plus assay is intended as an aid in the diagnosis of influenza from Nasopharyngeal swab specimens and should not be used as a sole basis for treatment. Nasal washings and aspirates are unacceptable for Xpert Xpress SARS-CoV-2/FLU/RSV testing.  Fact Sheet for Patients: EntrepreneurPulse.com.au  Fact Sheet for Healthcare  Providers: IncredibleEmployment.be  This test is not yet approved or cleared by the [place] FDA and has been authorized for detection and/or diagnosis of SARS-CoV-2 by FDA under an Emergency Use Authorization (EUA). This EUA will remain in effect (meaning this test can be used) for the duration of the COVID-19 declaration under Section 564(b)(1) of the Act, 21 U.S.C. section 360bbb-3(b)(1), unless the authorization is terminated or revoked.  Performed at [facility], [address] [place], [place] [address]   Urine culture     Status: Abnormal   Collection Time: 11/15/20  2:12 PM   Specimen: Urine, Random  Result Value Ref Range Status   Specimen Description   Final    URINE, RANDOM Performed at [facility], [address] [place], [place] [address]    Special Requests   Final    NONE Performed at [facility], [address] [place], [place] [address]    Culture (A)  Final    <10,000 COLONIES/mL INSIGNIFICANT GROWTH Performed at [facility], [place] [address] [place], [place] [address]    Report Status 11/16/2020 FINAL  Final  Surgical pcr screen     Status: None   Collection Time: 11/18/20 10:29 AM   Specimen: Nasal Mucosa; Nasal Swab  Result Value Ref Range Status   MRSA, PCR NEGATIVE NEGATIVE Final   Staphylococcus aureus NEGATIVE NEGATIVE Final    Comment: (NOTE) The Xpert SA Assay (FDA approved for NASAL specimens in patients 47 years of age and older), is one component of a comprehensive surveillance program. It is not intended to diagnose infection nor to guide or monitor treatment. Performed at [facility], [place]  [address] [place], [place] [address]      Labs: Basic Metabolic Panel: Recent Labs  Lab 11/15/20 1326 11/16/20 0150 11/20/20 1119 11/22/20 0358  NA 132* 133* 131* 134*  K 4.1 3.8 4.5 4.3  CL 99 105 99 101  CO2 19* 20* 23 26  GLUCOSE 236* 120* 296* 161*  BUN _0 CREATININE 0.90 0.95 0.78 0.70  CALCIUM 8.6* 8.2* 8.7* 8.9  MG  --   --   --  2.1   Liver Function Tests: Recent Labs  Lab 11/15/20 1326 11/22/20 0358  AST 43* 31  ALT 43 40  ALKPHOS 179* 165*  BILITOT 1.1 1.1  PROT 7.4 7.5  ALBUMIN 3.0* 2.6*   No results for input(s): LIPASE, AMYLASE in the last 168 hours. No results for input(s): AMMONIA in the last 168 hours. CBC: Recent Labs  Lab 11/17/20 0241 11/18/20 0239 11/19/20 0217 11/20/20 0222 11/22/20 0358  WBC 15.3* 14.6* 14.9* 14.8* 16.5*  NEUTROABS 11.3* 10.0* 11.4* 8.9* 11.2*  HGB 12.6* 13.3 14.0 13.7 14.2  HCT 39.3 39.4 42.9 42.2 44.2  MCV 92.9 87.2 89.6 90.6 91.3  PLT 495* 402* 361 448* 435*   Cardiac Enzymes: No results for input(s): CKTOTAL, CKMB, CKMBINDEX, TROPONINI in the last 168 hours. BNP: BNP (last 3 results) No results for input(s): BNP in the last 8760 hours.  ProBNP (last 3 results) No results for input(s): PROBNP in the last 8760 hours.  CBG: Recent Labs  Lab 11/21/20 0647 11/21/20 1203 11/21/20 1743 11/21/20 2023 11/22/20 0850  GLUCAP 166* 247* 256* 213* 183*       Signed:  Florencia Reasons MD, PhD, FACP  [facility] 11/22/2020, 11:38 AM

## 2020-11-22 NOTE — Plan of Care (Signed)
  Problem: Education: Goal: Knowledge of General Education information will improve Description: Including pain rating scale, medication(s)/side effects and non-pharmacologic comfort measures 11/22/2020 1238 by Peter Minium, RN Outcome: Adequate for Discharge 11/22/2020 1238 by Peter Minium, RN Outcome: Adequate for Discharge   Problem: Health Behavior/Discharge Planning: Goal: Ability to manage health-related needs will improve 11/22/2020 1238 by Peter Minium, RN Outcome: Adequate for Discharge 11/22/2020 1238 by Peter Minium, RN Outcome: Adequate for Discharge   Problem: Clinical Measurements: Goal: Ability to maintain clinical measurements within normal limits will improve 11/22/2020 1238 by Peter Minium, RN Outcome: Adequate for Discharge 11/22/2020 1238 by Peter Minium, RN Outcome: Adequate for Discharge Goal: Will remain free from infection 11/22/2020 1238 by Peter Minium, RN Outcome: Adequate for Discharge 11/22/2020 1238 by Peter Minium, RN Outcome: Adequate for Discharge Goal: Diagnostic test results will improve 11/22/2020 1238 by Peter Minium, RN Outcome: Adequate for Discharge 11/22/2020 1238 by Peter Minium, RN Outcome: Adequate for Discharge Goal: Respiratory complications will improve 11/22/2020 1238 by Peter Minium, RN Outcome: Adequate for Discharge 11/22/2020 1238 by Peter Minium, RN Outcome: Adequate for Discharge Goal: Cardiovascular complication will be avoided 11/22/2020 1238 by Peter Minium, RN Outcome: Adequate for Discharge 11/22/2020 1238 by Peter Minium, RN Outcome: Adequate for Discharge   Problem: Activity: Goal: Risk for activity intolerance will decrease 11/22/2020 1238 by Peter Minium, RN Outcome: Adequate for Discharge 11/22/2020 1238 by Peter Minium, RN Outcome: Adequate for Discharge   Problem: Nutrition: Goal: Adequate nutrition will be maintained 11/22/2020 1238 by Peter Minium, RN Outcome: Adequate for Discharge 11/22/2020 1238 by Peter Minium, RN Outcome:  Adequate for Discharge   Problem: Coping: Goal: Level of anxiety will decrease 11/22/2020 1238 by Peter Minium, RN Outcome: Adequate for Discharge 11/22/2020 1238 by Peter Minium, RN Outcome: Adequate for Discharge   Problem: Elimination: Goal: Will not experience complications related to bowel motility 11/22/2020 1238 by Peter Minium, RN Outcome: Adequate for Discharge 11/22/2020 1238 by Peter Minium, RN Outcome: Adequate for Discharge Goal: Will not experience complications related to urinary retention 11/22/2020 1238 by Peter Minium, RN Outcome: Adequate for Discharge 11/22/2020 1238 by Peter Minium, RN Outcome: Adequate for Discharge   Problem: Pain Managment: Goal: General experience of comfort will improve 11/22/2020 1238 by Peter Minium, RN Outcome: Adequate for Discharge 11/22/2020 1238 by Peter Minium, RN Outcome: Adequate for Discharge   Problem: Safety: Goal: Ability to remain free from injury will improve 11/22/2020 1238 by Peter Minium, RN Outcome: Adequate for Discharge 11/22/2020 1238 by Peter Minium, RN Outcome: Adequate for Discharge   Problem: Skin Integrity: Goal: Risk for impaired skin integrity will decrease 11/22/2020 1238 by Peter Minium, RN Outcome: Adequate for Discharge 11/22/2020 1238 by Peter Minium, RN Outcome: Adequate for Discharge

## 2020-11-27 ENCOUNTER — Telehealth: Payer: Self-pay

## 2020-11-27 ENCOUNTER — Ambulatory Visit (INDEPENDENT_AMBULATORY_CARE_PROVIDER_SITE_OTHER): Payer: Commercial Managed Care - PPO | Admitting: Internal Medicine

## 2020-11-27 ENCOUNTER — Other Ambulatory Visit: Payer: Self-pay

## 2020-11-27 DIAGNOSIS — M009 Pyogenic arthritis, unspecified: Secondary | ICD-10-CM | POA: Diagnosis not present

## 2020-11-27 DIAGNOSIS — A419 Sepsis, unspecified organism: Secondary | ICD-10-CM

## 2020-11-27 DIAGNOSIS — R509 Fever, unspecified: Secondary | ICD-10-CM | POA: Insufficient documentation

## 2020-11-27 MED ORDER — LEVOFLOXACIN 500 MG PO TABS: 500.0000 mg | ORAL_TABLET | Freq: Every day | ORAL | 1 refills | Status: DC

## 2020-11-27 NOTE — Telephone Encounter (Signed)
Patient requested provider fill out FMLA forms for work after today's visit. Handed copy of FMLA forms to provider. MD will review forms and fill out as much as possible this afternoon. [name] will follow up once forms have been completed.  Patient took original copy of forms with him after his visit. Will contact his other providers to see if they need to fill anything out.

## 2020-11-27 NOTE — Assessment & Plan Note (Signed)
Unfortunately, we do not know the cause of his septic arthritis.  At this point it is unclear to me if his slightly worsening pain and fullness is due to worsening infection in his shoulder.  We will have him continue vancomycin and add levofloxacin for gram-negative rod coverage.  He will follow-up here in 1 week.  I encouraged him to schedule follow-up with Dr. Stann Mainland as soon as possible.

## 2020-11-27 NOTE — Assessment & Plan Note (Signed)
The cause of his new fever is unclear.  I will do blood cultures today to see if he has any evidence of PICC line infection and I have broadened antibiotic therapy for his septic arthritis of unknown cause.  I do note that his recent blood work showed a subtherapeutic vancomycin trough of 4.2.  He is due for repeat lab work early next week.

## 2020-11-27 NOTE — Progress Notes (Signed)
[facility]  Patient Active Problem List   Diagnosis Date Noted  . Fever 11/27/2020    Priority: High  . Septic arthritis of shoulder, left (HCC)     Priority: High  . Sepsis ([place]) 11/15/2020  . Type 2 diabetes mellitus with diabetic polyneuropathy, with long-term current use of insulin ([place]) 07/05/2019  . Allergic rhinitis with a nonallergic component 12/08/2016  . Psoriasis 12/08/2016  . Diabetes ([place]) 05/31/2012  . Hypertension 05/31/2012    Patient's Medications  New Prescriptions   LEVOFLOXACIN (LEVAQUIN) 500 MG TABLET    Take 1 tablet (500 mg total) by mouth daily.  Previous Medications   ACETAMINOPHEN (TYLENOL) 325 MG TABLET    Take 2 tablets (650 mg total) by mouth every 6 (six) hours as needed for mild pain (or Fever >/= 101).   ASPIRIN EC 81 MG TABLET    Take 81 mg by mouth every morning.   ATORVASTATIN (LIPITOR) 20 MG TABLET    TAKE 1 TABLET (20 MG TOTAL) BY MOUTH DAILY AT 6 PM.   BAYER MICROLET LANCETS LANCETS    TEST UP TO 3 TIMES A DAY   BLOOD GLUCOSE METER KIT AND SUPPLIES    Dispense based on patient and insurance preference. Use up to 3 times daily, uncontrolled diabetes.  Contour Next meter with test strips and lancets.   CHROMIUM-CINNAMON PO    Take 1 tablet by mouth 2 (two) times daily.   CLOBETASOL PROPIONATE 0.05 % LOTION    Apply 1 application topically daily as needed (psoriasis).   CONTOUR NEXT TEST TEST STRIP    TEST UP TO 3 TIMES A DAY   CYCLOBENZAPRINE (FLEXERIL) 5 MG TABLET    Take 1 tablet (5 mg total) by mouth 3 (three) times daily as needed for muscle spasms.   DULAGLUTIDE (TRULICITY) 3 KP/5.4SF SOPN    Inject 3 mg into the skin every Monday.   EMPAGLIFLOZIN (JARDIANCE) 25 MG TABS TABLET    Take 25 mg by mouth every morning.   FLUTICASONE (FLONASE) 50 MCG/ACT NASAL SPRAY    PLACE 1-2 SPRAYS INTO BOTH NOSTRILS DAILY.   GABAPENTIN (NEURONTIN) 300 MG CAPSULE    Take 1 capsule (300 mg total) by mouth at bedtime.    INSULIN ASPART (NOVOLOG) 100 UNIT/ML FLEXPEN    Insulin sliding scale: Blood sugar  120-150   3units                       151-200   4units                       201-250   7units                       251- 300  11units                       301-350   15uints                       351-400   20units                       >400         call MD immediately   INSULIN PEN NEEDLE 32G X 4 MM MISC    4x daily   LISINOPRIL (PRINIVIL,ZESTRIL) 10 MG TABLET  Take 1 tablet (10 mg total) by mouth daily.   LORATADINE (CLARITIN) 10 MG TABLET    Take 10 mg by mouth daily as needed for allergies, rhinitis or itching.   MELOXICAM (MOBIC) 15 MG TABLET    Take 1 tablet (15 mg total) by mouth 2 (two) times daily as needed for pain.   METFORMIN (GLUCOPHAGE) 1000 MG TABLET    TAKE 1 TABLET (1,000 MG TOTAL) BY MOUTH 2 (TWO) TIMES DAILY WITH A MEAL.   MULTIPLE VITAMIN (MULTIVITAMIN WITH MINERALS) TABS TABLET    Take 1 tablet by mouth every morning. Men's 50 plus   OXYCODONE 10 MG TABS    Take 1 tablet (10 mg total) by mouth every 6 (six) hours as needed for moderate pain.   POLYETHYLENE GLYCOL (MIRALAX / GLYCOLAX) 17 G PACKET    Take 17 g by mouth daily.   SENNA-DOCUSATE (SENOKOT-S) 8.6-50 MG TABLET    Take 1 tablet by mouth 2 (two) times daily for 7 days.   VANCOMYCIN IVPB    Inject 1,000 mg into the vein every 12 (twelve) hours for 21 days. Indication:  septic shoulder First Dose: Yes Last Day of Therapy:  12/13/2020 Labs - Sunday/Monday:  CBC/D, BMP, and vancomycin trough. Labs - Thursday:  BMP and vancomycin trough Labs - Every other week:  ESR and CRP Method of administration:Elastomeric Method of administration may be changed at the discretion of the patient and/or caregiver's ability to self-administer the medication ordered.   VITAMIN B-12 (CYANOCOBALAMIN) 1000 MCG TABLET    Take 1 tablet (1,000 mcg total) by mouth daily.  Modified Medications   No medications on file  Discontinued Medications   No  medications on file    Subjective: Logan [name] is in for a work in hospital follow-up visit.  He was hospitalized on 11/15/2020 with acute, severe left shoulder pain and sepsis.  He was found to have glenohumeral septic arthritis.  He was started on broad IV antibiotic therapy and underwent I&D on 11/18/2020.  Unfortunately specimens for culture never made it to the laboratory.  Blood cultures were negative.  He was seen by my partner, Dr. Carlyle [name], who recommended PICC placement and discharged on IV vancomycin.  He was discharged on 11/22/2020.  He says that his shoulder felt much better when he was discharged but that over the last 24 hours he has felt worsening "fullness" in his left shoulder and slightly worsening pain.  Last night he had fever to 101.4 degrees.  He has still been using Tylenol, OxyContin, gabapentin and meloxicam to try to manage his pain.  He has not seen Dr. Stann [name], his orthopedic surgeon.  He has not had any problems with his PICC line and feels like he is tolerating his vancomycin.  He denies missing any doses.  Review of Systems: Review of Systems  Constitutional: Positive for fever and malaise/fatigue. Negative for chills and diaphoresis.  Respiratory: Negative for cough and shortness of breath.   Cardiovascular: Negative for chest pain.  Gastrointestinal: Negative for abdominal pain, diarrhea, nausea and vomiting.  Musculoskeletal: Positive for joint pain.  Skin: Negative for rash.       He has stable psoriasis.    Past Medical History:  Diagnosis Date  . Diabetes mellitus    takes Janumet and Invokana daily  . Hernia, umbilical   . Hypertension    takes Lisinopril daily  . Plaque psoriasis   . Seasonal allergies    takes Claritin daily and uses Flonase daily  Social History   Tobacco Use  . Smoking status: Never Smoker  . Smokeless tobacco: Never Used  Vaping Use  . Vaping Use: Never used  Substance Use Topics  . Alcohol use: Yes    Comment: occ   . Drug use: No    Family History  Problem Relation Age of Onset  . Hypertension Mother   . Diverticulosis Mother   . Osteoporosis Mother   . Colon polyps Mother   . Diabetes Father   . Alcohol abuse Father   . Osteoporosis Sister   . Hypertension Sister   . Diabetes Brother   . Hypertension Brother   . Colon polyps Brother   . Hypertension Maternal Grandmother   . Heart disease Maternal Grandfather   . Diabetes Paternal Grandfather   . Diabetes Brother   . Hypertension Brother   . Heart attack Brother   . Osteoporosis Sister   . Colon cancer Neg Hx   . Esophageal cancer Neg Hx   . Stomach cancer Neg Hx   . Rectal cancer Neg Hx     Allergies  Allergen Reactions  . Penicillins Hives and Rash    Red rashes when he was a teenager; tolerated cefazolin    Objective: Vitals:   11/27/20 1106  BP: 119/87  Pulse: (!) 140  Temp: 99.3 F (37.4 C)  TempSrc: Oral  SpO2: 96%  Weight: 165 lb (74.8 kg)   Body mass index is 27.46 kg/m.  Physical Exam Constitutional:      Comments: He appears somewhat tired but he is very pleasant and in no distress.  Cardiovascular:     Rate and Rhythm: Normal rate and regular rhythm.     Heart sounds: No murmur heard.   Pulmonary:     Effort: Pulmonary effort is normal.     Breath sounds: Normal breath sounds.  Abdominal:     Palpations: Abdomen is soft.     Tenderness: There is no abdominal tenderness.  Musculoskeletal:     Comments: He still has Steri-Strips on his left shoulder incision sites.  He has some diffuse swelling with mild tenderness on palpation.  There is no erythema or unusual warmth.  There is no drainage from his incision sites.  He has very limited range of motion due to pain.  Skin:    Comments: He has 2 small areas of erythematous patches with some scale on his upper back.  His right arm PICC site looks good.  Psychiatric:        Mood and Affect: Mood normal.     Lab Results    Problem List Items  Addressed This Visit      High   Septic arthritis of shoulder, left ([place])    Unfortunately, we do not know the cause of his septic arthritis.  At this point it is unclear to me if his slightly worsening pain and fullness is due to worsening infection in his shoulder.  We will have him continue vancomycin and add levofloxacin for gram-negative rod coverage.  He will follow-up here in 1 week.  I encouraged him to schedule follow-up with Dr. Stann [name] as soon as possible.      Relevant Medications   levofloxacin (LEVAQUIN) 500 MG tablet   Other Relevant Orders   Blood culture (routine single)   Blood culture (routine single)   Fever    The cause of his new fever is unclear.  I will do blood cultures today to see if he has any evidence  of PICC line infection and I have broadened antibiotic therapy for his septic arthritis of unknown cause.  I do note that his recent blood work showed a subtherapeutic vancomycin trough of 4.2.  He is due for repeat lab work early next week.      Relevant Orders   Blood culture (routine single)   Blood culture (routine single)     Unprioritized   Sepsis ([place])    His initial septic picture resolved promptly with antibiotic therapy and incision and drainage of his septic left shoulder.          Michel Bickers, MD [facility] [phone] pager   [phone] cell 11/27/2020, 12:25 PM

## 2020-11-27 NOTE — Telephone Encounter (Signed)
Patient called complaining of soreness and tightness in his shoulder. Patient also reports fever of 99-101 for the past of couple of days. Patient denies any redness or irritation around his picc line. Patient was discharged from the hospital on 11/22/20 for sepsis of left shoulder. Patient is not scheduled for his hospital follow up with or office until 12/19/20 with Dr. Tommy Medal.  I have spoke with Dr. Megan Salon in regard to the patient and he will see the patient today at 10:45 am Please advise

## 2020-11-27 NOTE — Assessment & Plan Note (Signed)
His initial septic picture resolved promptly with antibiotic therapy and incision and drainage of his septic left shoulder.

## 2020-11-29 ENCOUNTER — Inpatient Hospital Stay (HOSPITAL_COMMUNITY): Payer: Commercial Managed Care - PPO

## 2020-11-29 ENCOUNTER — Emergency Department (HOSPITAL_COMMUNITY): Payer: Commercial Managed Care - PPO

## 2020-11-29 ENCOUNTER — Inpatient Hospital Stay (HOSPITAL_COMMUNITY)
Admission: EM | Admit: 2020-11-29 | Discharge: 2020-12-03 | DRG: 872 | Disposition: A | Discharge status: Home Health | Payer: Commercial Managed Care - PPO | Attending: Internal Medicine | Admitting: Internal Medicine

## 2020-11-29 ENCOUNTER — Other Ambulatory Visit: Payer: Self-pay

## 2020-11-29 ENCOUNTER — Telehealth: Payer: Self-pay

## 2020-11-29 ENCOUNTER — Encounter (HOSPITAL_COMMUNITY): Payer: Self-pay

## 2020-11-29 ENCOUNTER — Encounter (HOSPITAL_COMMUNITY): Payer: Commercial Managed Care - PPO

## 2020-11-29 DIAGNOSIS — Z833 Family history of diabetes mellitus: Secondary | ICD-10-CM

## 2020-11-29 DIAGNOSIS — M19012 Primary osteoarthritis, left shoulder: Secondary | ICD-10-CM | POA: Diagnosis present

## 2020-11-29 DIAGNOSIS — E663 Overweight: Secondary | ICD-10-CM | POA: Diagnosis present

## 2020-11-29 DIAGNOSIS — E119 Type 2 diabetes mellitus without complications: Secondary | ICD-10-CM

## 2020-11-29 DIAGNOSIS — E785 Hyperlipidemia, unspecified: Secondary | ICD-10-CM | POA: Diagnosis present

## 2020-11-29 DIAGNOSIS — Z880 Allergy status to penicillin: Secondary | ICD-10-CM

## 2020-11-29 DIAGNOSIS — M25512 Pain in left shoulder: Secondary | ICD-10-CM | POA: Diagnosis present

## 2020-11-29 DIAGNOSIS — R609 Edema, unspecified: Secondary | ICD-10-CM | POA: Diagnosis present

## 2020-11-29 DIAGNOSIS — Z20822 Contact with and (suspected) exposure to covid-19: Secondary | ICD-10-CM | POA: Diagnosis present

## 2020-11-29 DIAGNOSIS — Z8249 Family history of ischemic heart disease and other diseases of the circulatory system: Secondary | ICD-10-CM | POA: Diagnosis not present

## 2020-11-29 DIAGNOSIS — M60012 Infective myositis, left shoulder: Secondary | ICD-10-CM | POA: Diagnosis not present

## 2020-11-29 DIAGNOSIS — Z811 Family history of alcohol abuse and dependence: Secondary | ICD-10-CM | POA: Diagnosis not present

## 2020-11-29 DIAGNOSIS — R7989 Other specified abnormal findings of blood chemistry: Secondary | ICD-10-CM | POA: Diagnosis present

## 2020-11-29 DIAGNOSIS — Z794 Long term (current) use of insulin: Secondary | ICD-10-CM

## 2020-11-29 DIAGNOSIS — M009 Pyogenic arthritis, unspecified: Secondary | ICD-10-CM | POA: Diagnosis present

## 2020-11-29 DIAGNOSIS — K047 Periapical abscess without sinus: Secondary | ICD-10-CM | POA: Diagnosis not present

## 2020-11-29 DIAGNOSIS — A419 Sepsis, unspecified organism: Principal | ICD-10-CM | POA: Diagnosis present

## 2020-11-29 DIAGNOSIS — E1165 Type 2 diabetes mellitus with hyperglycemia: Secondary | ICD-10-CM | POA: Diagnosis present

## 2020-11-29 DIAGNOSIS — R61 Generalized hyperhidrosis: Secondary | ICD-10-CM | POA: Diagnosis present

## 2020-11-29 DIAGNOSIS — E1169 Type 2 diabetes mellitus with other specified complication: Secondary | ICD-10-CM | POA: Diagnosis not present

## 2020-11-29 DIAGNOSIS — Z7982 Long term (current) use of aspirin: Secondary | ICD-10-CM

## 2020-11-29 DIAGNOSIS — E1369 Other specified diabetes mellitus with other specified complication: Secondary | ICD-10-CM | POA: Diagnosis not present

## 2020-11-29 DIAGNOSIS — Z8371 Family history of colonic polyps: Secondary | ICD-10-CM | POA: Diagnosis not present

## 2020-11-29 DIAGNOSIS — R000 Tachycardia, unspecified: Secondary | ICD-10-CM | POA: Diagnosis present

## 2020-11-29 DIAGNOSIS — R7401 Elevation of levels of liver transaminase levels: Secondary | ICD-10-CM | POA: Diagnosis present

## 2020-11-29 DIAGNOSIS — I10 Essential (primary) hypertension: Secondary | ICD-10-CM | POA: Diagnosis present

## 2020-11-29 DIAGNOSIS — L400 Psoriasis vulgaris: Secondary | ICD-10-CM | POA: Diagnosis present

## 2020-11-29 DIAGNOSIS — M609 Myositis, unspecified: Secondary | ICD-10-CM | POA: Diagnosis present

## 2020-11-29 DIAGNOSIS — M25519 Pain in unspecified shoulder: Secondary | ICD-10-CM | POA: Diagnosis not present

## 2020-11-29 DIAGNOSIS — Z8262 Family history of osteoporosis: Secondary | ICD-10-CM

## 2020-11-29 DIAGNOSIS — Z79899 Other long term (current) drug therapy: Secondary | ICD-10-CM

## 2020-11-29 DIAGNOSIS — R509 Fever, unspecified: Secondary | ICD-10-CM | POA: Diagnosis present

## 2020-11-29 DIAGNOSIS — M25511 Pain in right shoulder: Secondary | ICD-10-CM | POA: Diagnosis not present

## 2020-11-29 DIAGNOSIS — Z6828 Body mass index (BMI) 28.0-28.9, adult: Secondary | ICD-10-CM

## 2020-11-29 LAB — RESP PANEL BY RT-PCR (FLU A&B, COVID) ARPGX2
Influenza A by PCR: NEGATIVE
Influenza B by PCR: NEGATIVE
SARS Coronavirus 2 by RT PCR: NEGATIVE

## 2020-11-29 LAB — COMPREHENSIVE METABOLIC PANEL
ALT: 63 U/L — ABNORMAL HIGH (ref 0–44)
AST: 52 U/L — ABNORMAL HIGH (ref 15–41)
Albumin: 2.8 g/dL — ABNORMAL LOW (ref 3.5–5.0)
Alkaline Phosphatase: 221 U/L — ABNORMAL HIGH (ref 38–126)
Anion gap: 12 (ref 5–15)
BUN: 15 mg/dL (ref 6–20)
CO2: 22 mmol/L (ref 22–32)
Calcium: 8.9 mg/dL (ref 8.9–10.3)
Chloride: 99 mmol/L (ref 98–111)
Creatinine, Ser: 0.85 mg/dL (ref 0.61–1.24)
GFR, Estimated: >60 mL/min (ref >60)
Glucose, Bld: 188 mg/dL — ABNORMAL HIGH (ref 70–99)
Potassium: 4.2 mmol/L (ref 3.5–5.1)
Sodium: 133 mmol/L — ABNORMAL LOW (ref 135–145)
Total Bilirubin: 0.3 mg/dL (ref 0.3–1.2)
Total Protein: 8 g/dL (ref 6.5–8.1)

## 2020-11-29 LAB — CBC WITH DIFFERENTIAL/PLATELET
Abs Immature Granulocytes: 0.17 10*3/uL — ABNORMAL HIGH (ref 0.00–0.07)
Basophils Absolute: 0.2 10*3/uL — ABNORMAL HIGH (ref 0.0–0.1)
Basophils Relative: 2 %
Eosinophils Absolute: 0.1 10*3/uL (ref 0.0–0.5)
Eosinophils Relative: 1 %
HCT: 44.5 % (ref 39.0–52.0)
Hemoglobin: 14.1 g/dL (ref 13.0–17.0)
Immature Granulocytes: 2 %
Lymphocytes Relative: 19 %
Lymphs Abs: 1.8 10*3/uL (ref 0.7–4.0)
MCH: 28.4 pg (ref 26.0–34.0)
MCHC: 31.7 g/dL (ref 30.0–36.0)
MCV: 89.5 fL (ref 80.0–100.0)
Monocytes Absolute: 1.4 10*3/uL — ABNORMAL HIGH (ref 0.1–1.0)
Monocytes Relative: 15 %
Neutro Abs: 6.1 10*3/uL (ref 1.7–7.7)
Neutrophils Relative %: 61 %
Platelets: 142 10*3/uL — ABNORMAL LOW (ref 150–400)
RBC: 4.97 MIL/uL (ref 4.22–5.81)
RDW: 13.2 % (ref 11.5–15.5)
WBC: 9.7 10*3/uL (ref 4.0–10.5)
nRBC: 0 % (ref 0.0–0.2)

## 2020-11-29 LAB — GLUCOSE, CAPILLARY: Glucose-Capillary: 176 mg/dL — ABNORMAL HIGH (ref 70–99)

## 2020-11-29 LAB — URINALYSIS, ROUTINE W REFLEX MICROSCOPIC
Bacteria, UA: NONE SEEN
Bilirubin Urine: NEGATIVE
Glucose, UA: >=500 mg/dL — AB
Hgb urine dipstick: NEGATIVE
Ketones, ur: NEGATIVE mg/dL
Leukocytes,Ua: NEGATIVE
Nitrite: NEGATIVE
Protein, ur: NEGATIVE mg/dL
Specific Gravity, Urine: 1.029 (ref 1.005–1.030)
pH: 5 (ref 5.0–8.0)

## 2020-11-29 LAB — PROTIME-INR
INR: 1.1 (ref 0.8–1.2)
Prothrombin Time: 13.8 seconds (ref 11.4–15.2)

## 2020-11-29 LAB — APTT: aPTT: 31 seconds (ref 24–36)

## 2020-11-29 LAB — LACTIC ACID, PLASMA
Lactic Acid, Venous: 2 mmol/L (ref 0.5–1.9)
Lactic Acid, Venous: 1.2 mmol/L (ref 0.5–1.9)

## 2020-11-29 MED ORDER — GADOBUTROL 1 MMOL/ML IV SOLN
7.0000 mL | Freq: Once | INTRAVENOUS | Status: AC | PRN
  Administered 2020-11-29: 7 mL via INTRAVENOUS

## 2020-11-29 MED ORDER — OXYCODONE HCL 5 MG PO TABS
10.0000 mg | ORAL_TABLET | Freq: Four times a day (QID) | ORAL | Status: DC | PRN
  Administered 2020-11-30 – 2020-12-03 (×12): 10 mg via ORAL
  Filled 2020-11-29 (×12): qty 2

## 2020-11-29 MED ORDER — ASPIRIN EC 81 MG PO TBEC
81.0000 mg | DELAYED_RELEASE_TABLET | Freq: Every morning | ORAL | Status: DC
  Administered 2020-11-30 – 2020-12-03 (×4): 81 mg via ORAL
  Filled 2020-11-29 (×4): qty 1

## 2020-11-29 MED ORDER — GABAPENTIN 300 MG PO CAPS
300.0000 mg | ORAL_CAPSULE | Freq: Every day | ORAL | Status: DC
  Administered 2020-11-29 – 2020-12-02 (×4): 300 mg via ORAL
  Filled 2020-11-29 (×4): qty 1

## 2020-11-29 MED ORDER — VITAMIN B-12 1000 MCG PO TABS: 1000.0000 ug | ORAL_TABLET | Freq: Every day | ORAL | Status: DC

## 2020-11-29 MED ORDER — CHLORHEXIDINE GLUCONATE CLOTH 2 % EX PADS
6.0000 | MEDICATED_PAD | Freq: Every day | CUTANEOUS | Status: DC
  Administered 2020-11-30 – 2020-12-03 (×4): 6 via TOPICAL

## 2020-11-29 MED ORDER — LORATADINE 10 MG PO TABS: 10.0000 mg | ORAL_TABLET | Freq: Every day | ORAL | Status: DC | PRN

## 2020-11-29 MED ORDER — HYDRALAZINE HCL 20 MG/ML IJ SOLN: 10.0000 mg | Freq: Four times a day (QID) | INTRAMUSCULAR | Status: DC | PRN

## 2020-11-29 MED ORDER — LORAZEPAM 0.5 MG PO TABS: 0.5000 mg | ORAL_TABLET | Freq: Four times a day (QID) | ORAL | Status: DC | PRN

## 2020-11-29 MED ORDER — VANCOMYCIN HCL 1000 MG/200ML IV SOLN
1000.0000 mg | Freq: Two times a day (BID) | INTRAVENOUS | Status: DC
  Filled 2020-11-29: qty 200

## 2020-11-29 MED ORDER — POLYETHYLENE GLYCOL 3350 17 G PO PACK
17.0000 g | PACK | Freq: Every day | ORAL | Status: DC | PRN
  Administered 2020-11-30 – 2020-12-03 (×4): 17 g via ORAL
  Filled 2020-11-29 (×4): qty 1

## 2020-11-29 MED ORDER — SODIUM CHLORIDE 0.9 % IV BOLUS
1000.0000 mL | Freq: Once | INTRAVENOUS | Status: AC
  Administered 2020-11-29: 1000 mL via INTRAVENOUS

## 2020-11-29 MED ORDER — LORAZEPAM 2 MG/ML IJ SOLN: 1.0000 mg | INTRAMUSCULAR | Status: DC | PRN

## 2020-11-29 MED ORDER — ATORVASTATIN CALCIUM 10 MG PO TABS: 20.0000 mg | ORAL_TABLET | Freq: Every day | ORAL | Status: DC

## 2020-11-29 MED ORDER — EMPAGLIFLOZIN 25 MG PO TABS
25.0000 mg | ORAL_TABLET | Freq: Every morning | ORAL | Status: DC
  Administered 2020-11-30 – 2020-12-03 (×4): 25 mg via ORAL
  Filled 2020-11-29 (×4): qty 1

## 2020-11-29 MED ORDER — ACETAMINOPHEN 325 MG PO TABS
650.0000 mg | ORAL_TABLET | Freq: Four times a day (QID) | ORAL | Status: DC | PRN
  Administered 2020-11-29 – 2020-12-03 (×6): 650 mg via ORAL
  Filled 2020-11-29 (×6): qty 2

## 2020-11-29 MED ORDER — LISINOPRIL 10 MG PO TABS: 10.0000 mg | ORAL_TABLET | Freq: Every day | ORAL | Status: DC

## 2020-11-29 MED ORDER — INSULIN ASPART 100 UNIT/ML ~~LOC~~ SOLN
0.0000 [IU] | Freq: Three times a day (TID) | SUBCUTANEOUS | Status: DC
  Administered 2020-11-30 – 2020-12-01 (×4): 2 [IU] via SUBCUTANEOUS
  Administered 2020-12-02: 3 [IU] via SUBCUTANEOUS
  Administered 2020-12-02: 2 [IU] via SUBCUTANEOUS
  Administered 2020-12-02: 3 [IU] via SUBCUTANEOUS
  Administered 2020-12-03: 2 [IU] via SUBCUTANEOUS

## 2020-11-29 MED ORDER — SODIUM CHLORIDE 0.9% FLUSH: 10.0000 mL | INTRAVENOUS | Status: DC | PRN

## 2020-11-29 MED ORDER — CYCLOBENZAPRINE HCL 5 MG PO TABS
5.0000 mg | ORAL_TABLET | Freq: Three times a day (TID) | ORAL | Status: DC | PRN
  Administered 2020-11-30 – 2020-12-03 (×8): 5 mg via ORAL
  Filled 2020-11-29 (×9): qty 1

## 2020-11-29 MED ORDER — ENOXAPARIN SODIUM 40 MG/0.4ML ~~LOC~~ SOLN
40.0000 mg | SUBCUTANEOUS | Status: DC
  Administered 2020-11-30 – 2020-12-02 (×3): 40 mg via SUBCUTANEOUS
  Filled 2020-11-29 (×3): qty 0.4

## 2020-11-29 MED ORDER — SENNOSIDES-DOCUSATE SODIUM 8.6-50 MG PO TABS
1.0000 | ORAL_TABLET | Freq: Two times a day (BID) | ORAL | Status: DC
  Administered 2020-11-29 – 2020-12-03 (×8): 1 via ORAL
  Filled 2020-11-29 (×8): qty 1

## 2020-11-29 MED ORDER — ALTEPLASE 2 MG IJ SOLR
2.0000 mg | Freq: Once | INTRAMUSCULAR | Status: DC
  Filled 2020-11-29: qty 2

## 2020-11-29 MED ORDER — LORAZEPAM 2 MG/ML IJ SOLN: 0.5000 mg | INTRAMUSCULAR | Status: DC | PRN

## 2020-11-29 MED ORDER — METFORMIN HCL 500 MG PO TABS
1000.0000 mg | ORAL_TABLET | Freq: Two times a day (BID) | ORAL | Status: DC
  Administered 2020-11-30: 1000 mg via ORAL
  Filled 2020-11-29: qty 2

## 2020-11-29 NOTE — ED Notes (Signed)
Pt returned from MRI and is sitting up eating dinner

## 2020-11-29 NOTE — H&P (Signed)
History and Physical    Logan [name] YYQ:[id number] DOB: [birth year] DOA: 11/29/2020  PCP: Rudene Anda, MD (Confirm with patient/family/NH records and if not entered, this has to be entered at [facility] point of entry) Patient coming from: Home  I have personally briefly reviewed patient's old medical records in [place]  Chief Complaint: Increasing left shoulder pain.  HPI: Logan [name] is a 50 y.o. male with medical history significant of IDDM, HTN, HLD, recent diagnosed left shoulder septic arthritis, presented with worsening of left shoulder pain and fever.  Patient was admitted on April 9th for left shoulder septic arthritis.  Arthroscopic incision and drainage and debridement done on 04/11.  According to OR record, culture was not able to obtain.  Sent home based IV vancomycin via right arm PICC line.  And patient been following with orthopedic surgery as well as infectious disease.  Patient reported that the left shoulder pain never went away, and on the contrary has been gradually getting worse.  He still experiences periods of subjective fever and chills.  2 days ago, he contacted orthopedic surgery who added Levaquin to current vancomycin therapy.  ED Course: WBC 9.7, patient afebrile but tachycardia, and lactic acid 2.0.  Review of Systems: As per HPI otherwise 14 point review of systems negative.    Past Medical History:  Diagnosis Date  . Diabetes mellitus    takes Janumet and Invokana daily  . Hernia, umbilical   . Hypertension    takes Lisinopril daily  . Plaque psoriasis   . Seasonal allergies    takes Claritin daily and uses Flonase daily    Past Surgical History:  Procedure Laterality Date  . INSERTION OF MESH N/A 05/07/2016   Procedure: INSERTION OF MESH;  Surgeon: Erroll Luna, MD;  Location: [place];  Service: General;  Laterality: N/A;  . SHOULDER ARTHROSCOPY Left 11/18/2020   Procedure: ARTHROSCOPIC INCISIONAL DRAINAGE WITH EXTENSIVE DEBRIDEMENT;   Surgeon: Nicholes Stairs, MD;  Location: [facility];  Service: Orthopedics;  Laterality: Left;  . TYMPANOSTOMY TUBE PLACEMENT    . VENTRAL HERNIA REPAIR N/A 05/07/2016   Procedure: REPAIR VENTRAL HERNIA;  Surgeon: Erroll Luna, MD;  Location: [facility];  Service: General;  Laterality: N/A;     reports that he has never smoked. He has never used smokeless tobacco. He reports current alcohol use. He reports that he does not use drugs.  Allergies  Allergen Reactions  . Penicillins Hives and Rash    Red rashes when he was a teenager; tolerated cefazolin    Family History  Problem Relation Age of Onset  . Hypertension Mother   . Diverticulosis Mother   . Osteoporosis Mother   . Colon polyps Mother   . Diabetes Father   . Alcohol abuse Father   . Osteoporosis Sister   . Hypertension Sister   . Diabetes Brother   . Hypertension Brother   . Colon polyps Brother   . Hypertension Maternal Grandmother   . Heart disease Maternal Grandfather   . Diabetes Paternal Grandfather   . Diabetes Brother   . Hypertension Brother   . Heart attack Brother   . Osteoporosis Sister   . Colon cancer Neg Hx   . Esophageal cancer Neg Hx   . Stomach cancer Neg Hx   . Rectal cancer Neg Hx      Prior to Admission medications   Medication Sig Start Date End Date Taking? Authorizing Provider  acetaminophen (TYLENOL) 325 MG tablet Take 2 tablets (  650 mg total) by mouth every 6 (six) hours as needed for mild pain (or Fever >/= 101). 11/22/20   Florencia Reasons, MD  aspirin EC 81 MG tablet Take 81 mg by mouth every morning.    [provider]  atorvastatin (LIPITOR) 20 MG tablet TAKE 1 TABLET (20 MG TOTAL) BY MOUTH DAILY AT 6 PM. 08/02/19   [name], MD  BAYER MICROLET LANCETS lancets TEST UP TO 3 TIMES A DAY 01/11/18   [name], MD  blood glucose meter kit and supplies Dispense based on patient and insurance preference. Use up to 3 times daily, uncontrolled diabetes.  Contour Next meter with  test strips and lancets. 11/15/17   [name], MD  CHROMIUM-CINNAMON PO Take 1 tablet by mouth 2 (two) times daily.    [provider]  Clobetasol Propionate 0.05 % lotion Apply 1 application topically daily as needed (psoriasis).    [provider]  CONTOUR NEXT TEST test strip TEST UP TO 3 TIMES A DAY 11/12/19   [name], MD  cyclobenzaprine (FLEXERIL) 5 MG tablet Take 1 tablet (5 mg total) by mouth 3 (three) times daily as needed for muscle spasms. 11/22/20   Florencia Reasons, MD  Dulaglutide (TRULICITY) 3 PF/7.9KW SOPN Inject 3 mg into the skin every Monday.    [provider]  empagliflozin (JARDIANCE) 25 MG TABS tablet Take 25 mg by mouth every morning.    [provider]  fluticasone (FLONASE) 50 MCG/ACT nasal spray PLACE 1-2 SPRAYS INTO BOTH NOSTRILS DAILY. Patient taking differently: Place 1-2 sprays into both nostrils daily as needed for allergies or rhinitis. 05/10/19   [name], MD  gabapentin (NEURONTIN) 300 MG capsule Take 1 capsule (300 mg total) by mouth at bedtime. 11/22/20   Florencia Reasons, MD  insulin aspart (NOVOLOG) 100 UNIT/ML FlexPen Insulin sliding scale: Blood sugar  120-150   3units                       151-200   4units                       201-250   7units                       251- 300  11units                       301-350   15uints                       351-400   20units                       >400         call MD immediately 11/22/20   Florencia Reasons, MD  Insulin Pen Needle 32G X 4 MM MISC 4x daily 11/22/20   Florencia Reasons, MD  levofloxacin (LEVAQUIN) 500 MG tablet Take 1 tablet (500 mg total) by mouth daily. 11/27/20   Michel Bickers, MD  lisinopril (PRINIVIL,ZESTRIL) 10 MG tablet Take 1 tablet (10 mg total) by mouth daily. 10/06/18   [name], MD  loratadine (CLARITIN) 10 MG tablet Take 10 mg by mouth daily as needed for allergies, rhinitis or itching.    [provider]  meloxicam (MOBIC) 15 MG tablet Take 1 tablet  (15 mg total) by mouth 2 (  two) times daily as needed for pain. 11/22/20   Florencia Reasons, MD  metFORMIN (GLUCOPHAGE) 1000 MG tablet TAKE 1 TABLET (1,000 MG TOTAL) BY MOUTH 2 (TWO) TIMES DAILY WITH A MEAL. 07/27/19   [name], MD  Multiple Vitamin (MULTIVITAMIN WITH MINERALS) TABS tablet Take 1 tablet by mouth every morning. Men's 50 plus    [provider]  oxyCODONE 10 MG TABS Take 1 tablet (10 mg total) by mouth every 6 (six) hours as needed for moderate pain. 11/22/20   Florencia Reasons, MD  polyethylene glycol (MIRALAX / GLYCOLAX) 17 g packet Take 17 g by mouth daily. 11/23/20   Florencia Reasons, MD  senna-docusate (SENOKOT-S) 8.6-50 MG tablet Take 1 tablet by mouth 2 (two) times daily for 7 days. 11/22/20 11/29/20  Florencia Reasons, MD  vancomycin IVPB Inject 1,000 mg into the vein every 12 (twelve) hours for 21 days. Indication:  septic shoulder First Dose: Yes Last Day of Therapy:  12/13/2020 Labs - Sunday/Monday:  CBC/D, BMP, and vancomycin trough. Labs - Thursday:  BMP and vancomycin trough Labs - Every other week:  ESR and CRP Method of administration:Elastomeric Method of administration may be changed at the discretion of the patient and/or caregiver's ability to self-administer the medication ordered. 11/22/20 12/13/20  Florencia Reasons, MD  vitamin B-12 (CYANOCOBALAMIN) 1000 MCG tablet Take 1 tablet (1,000 mcg total) by mouth daily. 11/22/20   Florencia Reasons, MD    Physical Exam: Vitals:   11/29/20 1306 11/29/20 1415 11/29/20 1500 11/29/20 1540  BP:  111/76 126/84 118/85  Pulse:  (!) 120 (!) 113 (!) 111  Resp:  (!) 21 (!) 29 (!) 22  Temp:      TempSrc:      SpO2:  95% 91% 91%  Weight: 72.1 kg     Height: $Remove'5\' 4"'RwLERrF$  (1.626 m)       Constitutional: NAD, calm, comfortable Vitals:   11/29/20 1306 11/29/20 1415 11/29/20 1500 11/29/20 1540  BP:  111/76 126/84 118/85  Pulse:  (!) 120 (!) 113 (!) 111  Resp:  (!) 21 (!) 29 (!) 22  Temp:      TempSrc:      SpO2:  95% 91% 91%  Weight: 72.1 kg     Height: $Remove'5\' 4"'SIJEYWG$   (1.626 m)      Eyes: PERRL, lids and conjunctivae normal ENMT: Mucous membranes are moist. Posterior pharynx clear of any exudate or lesions.Normal dentition.  Neck: normal, supple, no masses, no thyromegaly Respiratory: clear to auscultation bilaterally, no wheezing, no crackles. Normal respiratory effort. No accessory muscle use.  Cardiovascular: Regular rate and rhythm, no murmurs / rubs / gallops. No extremity edema. 2+ pedal pulses. No carotid bruits.  Abdomen: no tenderness, no masses palpated. No hepatosplenomegaly. Bowel sounds positive.  Musculoskeletal: Left shoulder warm to touch and ROM extremely limited due to the pain Skin: no rashes, lesions, ulcers. No induration Neurologic: CN 2-12 grossly intact. Sensation intact, DTR normal. Strength 5/5 in all 4.  Psychiatric: Normal judgment and insight. Alert and oriented x 3. Normal mood.     Labs on Admission: I have personally reviewed following labs and imaging studies  CBC: Recent Labs  Lab 11/29/20 1329  WBC 9.7  NEUTROABS 6.1  HGB 14.1  HCT 44.5  MCV 89.5  PLT 208*   Basic Metabolic Panel: Recent Labs  Lab 11/29/20 1329  NA 133*  K 4.2  CL 99  CO2 22  GLUCOSE 188*  BUN 15  CREATININE 0.85  CALCIUM 8.9  GFR: Estimated Creatinine Clearance: 92.6 mL/min (by C-G formula based on SCr of 0.85 mg/dL). Liver Function Tests: Recent Labs  Lab 11/29/20 1329  AST 52*  ALT 63*  ALKPHOS 221*  BILITOT 0.3  PROT 8.0  ALBUMIN 2.8*   No results for input(s): LIPASE, AMYLASE in the last 168 hours. No results for input(s): AMMONIA in the last 168 hours. Coagulation Profile: Recent Labs  Lab 11/29/20 1329  INR 1.1   Cardiac Enzymes: No results for input(s): CKTOTAL, CKMB, CKMBINDEX, TROPONINI in the last 168 hours. BNP (last 3 results) No results for input(s): PROBNP in the last 8760 hours. HbA1C: No results for input(s): HGBA1C in the last 72 hours. CBG: No results for input(s): GLUCAP in the last 168  hours. Lipid Profile: No results for input(s): CHOL, HDL, LDLCALC, TRIG, CHOLHDL, LDLDIRECT in the last 72 hours. Thyroid Function Tests: No results for input(s): TSH, T4TOTAL, FREET4, T3FREE, THYROIDAB in the last 72 hours. Anemia Panel: No results for input(s): VITAMINB12, FOLATE, FERRITIN, TIBC, IRON, RETICCTPCT in the last 72 hours. Urine analysis:    Component Value Date/Time   COLORURINE YELLOW 11/15/2020 1412   APPEARANCEUR CLEAR 11/15/2020 1412   LABSPEC <1.005 (L) 11/15/2020 1412   PHURINE 5.0 11/15/2020 1412   GLUCOSEU >=500 (A) 11/15/2020 1412   HGBUR NEGATIVE 11/15/2020 1412   BILIRUBINUR NEGATIVE 11/15/2020 1412   BILIRUBINUR negative 01/01/2017 0900   BILIRUBINUR neg 06/21/2013 1702   KETONESUR >80 (A) 11/15/2020 1412   PROTEINUR NEGATIVE 11/15/2020 1412   UROBILINOGEN 0.2 01/01/2017 0900   NITRITE NEGATIVE 11/15/2020 1412   LEUKOCYTESUR NEGATIVE 11/15/2020 1412    Radiological Exams on Admission: DG Chest 1 View  Result Date: 11/29/2020 CLINICAL DATA:  Shoulder pain.  Fever EXAM: CHEST  1 VIEW COMPARISON:  11/15/2020 FINDINGS: Interval placement of a right-sided PICC line with distal tip terminating at the level of the distal SVC. The heart size and mediastinal contours are within normal limits. Low lung volumes with mild bibasilar atelectasis. No pleural effusion or pneumothorax. The visualized skeletal structures are unremarkable. IMPRESSION: Low lung volumes with bibasilar atelectasis. Electronically Signed   By: Davina Poke D.O.   On: 11/29/2020 13:56    EKG: Independently reviewed. Sinus tachy  Assessment/Plan Active Problems:   Septic arthritis of shoulder, left (HCC)   Septic arthritis ([place])  (please populate well all problems here in Problem List. (For example, if patient is on BP meds at home and you resume or decide to hold them, it is a problem that needs to be her. Same for CAD, COPD, HLD and so on)  Left shoulder septic arthritis -Failed  outpatient IV antibiotic treatment -ID consulted in ED, who recommend discontinue Levaquin and continue Vancomycin. -Ortho will see patient.  Sinus tachycardia -With mild elevation of lactate level, suspect early sepsis, will give 1 bolus of IV fluids and reevaluate.  Mild transaminitis -New compared to the level on last admission. No RUQ pain. Will hold Statin for now.  IDDM with hyperglycemia -Recent A1C 8.3% -Continue metformin, Jardiance -Add sliding scale.  HTN -Suspect early sepsis, hold home BP meds  HLD -Hold Statin as above.  DVT prophylaxis: Lovenox Code Status: Full code Family Communication: none at bedside Disposition Plan: Expect another OR, and expect more than 2 midnight hospital stay Consults called: ID, Ortho Admission status: MedSurg   Lequita Halt MD [facility] Pager [phone]  11/29/2020, 4:11 PM

## 2020-11-29 NOTE — Telephone Encounter (Addendum)
Dr Megan Salon called triage regarding patient's direct admit. Dr Megan Salon is out of town, unable to admit patient today. No clinic appointments with other providers available, they request patient go to ER for workup/admission. RN called patient's cell, but call went to a full voicemail. RN called patient's home number, left voicemail asking him to call the nurse at [phone]. Left voicemail for patient's emergency contact. Sent mychart message to patient. Will continue to follow. Landis Gandy, RN

## 2020-11-29 NOTE — ED Triage Notes (Signed)
Emergency Medicine Provider Triage Evaluation Note  Logan [name] , a 50 y.o. male  was evaluated in triage.  Pt complains of fever and increased pain to his left shoulder.  Patient reports recently left shoulder surgery.  Patient reports that he developed sepsis shortly after the surgery was admitted to the hospital.  Patient has been receiving IV vancomycin via PICC line at home.  Reports antibiotics recently levofloxacin.  Patient reports Tmax 101.64F temporally in the last 24 hours.  Denies numbness or tingling to extremities or extremities, color change chest pain shortness of vomiting.  Patient reports she was sent here by his infectious disease physician for further evaluation.    Review of Systems  Positive: Fever, left shoulder pain  Negative: numbness or tingling to extremities or extremities, color change chest pain shortness of vomiting.  Physical Exam  BP (!) 156/102 (BP Location: Left Wrist)   Pulse (!) 133   Temp 99.4 F (37.4 C) (Oral)   Resp 16   Ht 5\' 4"  (1.626 m)   Wt 72.1 kg   SpO2 98%   BMI 27.29 kg/m  Gen:   Awake, no distress   HEENT:  Atraumatic  Resp:  Normal effort, lungs clear to auscultation  Cardiac:  Tachycardia at rate of 133 MSK:   PICC line to right upper extremity without any erythema, decreased ROM to left shoulder d/t complaints of pain, no swelling warmth or erythema to left shoulder  Neuro:  Speech clear   Medical Decision Making  Medically screening exam initiated at 1:10 PM.  Appropriate orders placed.  Logan [name] was informed that the remainder of the evaluation will be completed by another provider, this initial triage assessment does not replace that evaluation, and the importance of remaining in the ED until their evaluation is complete.  Clinical Impression   The patient appears stable so that the remainder of the work up may be completed by another provider.      Loni Beckwith, [place] 11/29/20 1316

## 2020-11-29 NOTE — Consult Note (Signed)
Date of Admission:  11/29/2020          Reason for Consult: Fevers chills shoulder pain with likely recurrence of his septic shoulder    Referring Provider: Dr Roosevelt Locks   Assessment:  1. 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 History of tooth infection in January 3. Fevers, chills malaise 4. Penicillin allergy that was rash 20-30 years ago  Plan:  1. DC antibiotics 2. Blood cultures again 3. MRI shoulder 4. Duplex right arm 5. Panorex   Dr. Juleen China to check in on over the weekend.   Active Problems:   Septic arthritis of shoulder, left (HCC)   Septic arthritis ([place])   Scheduled Meds: . [START ON 11/30/2020] aspirin EC  81 mg Oral q morning  . [START ON 11/30/2020] empagliflozin  25 mg Oral q morning  . enoxaparin (LOVENOX) injection  40 mg Subcutaneous Q24H  . gabapentin  300 mg Oral QHS  . insulin aspart  0-15 Units Subcutaneous TID WC  . metFORMIN  1,000 mg Oral BID WC  . senna-docusate  1 tablet Oral BID  . vitamin B-12  1,000 mcg Oral Daily   Continuous Infusions: PRN Meds:.acetaminophen, cyclobenzaprine, loratadine, Oxycodone HCl, polyethylene glycol  HPI: Logan [name] is a 50 y.o. male who had apparently a tooth infection in January underneath one of his implants that potentially needed surgical attention but resolved with antibiotics.  He brought this up today in relation to his shoulder infection that was diagnosed this April.   Patient was initially admitted on 8 April with shoulder pain and fevers.  Temperature as high as 105 degrees at that time.  Blood cultures were taken and he was started on vancomycin and cefepime.  MRI was performed which showed evidence of glenohumeral septic arthritis and subacromial subdeltoid septic bursitis was deltoid and subscapularis myositis superior supraspinatus and intra-articular biceps tendinosis and labral tear,  Patient ultimately underwent left shoulder arthroscopic irrigation and debridement of the  glenohumeral joint and debridement of the rotator anterior labrum superior labrum posterior labrum and biceps tenotomy.  No intraoperative cultures were done at that point in time.  He was seen by my partner Dr. Baxter Flattery who elected to treat the patient with IV vancomycin.  After discharge the patient began developing fevers and more tightness in his shoulder sensation that he felt quite similar to that which she had prior to his diagnosis of septic shoulder.  He was seen by my partner Dr. Megan Salon 2 days ago.  Dr. Megan Salon checked blood cultures and added levofloxacin to the patient's regimen.  The patient continues to have fevers diaphoresis malaise and worsening shoulder pain.  I am concerned that he has persistent shoulder infection that may need further surgery and also potentially different antibiotics.  Would like to stop his antibiotics to increase the yield on intraoperative cultures.  We will obtain repeat blood cultures.  An MRI of his left shoulder has been ordered.  I am also ordering a duplex of his right arm.  Is not have much to suggest a DVT here but just thought I would check.  Certainly he will be in a very safe place to be off antibiotics and to be observed in the hospital.  The more days he is off antibiotics the greater will have a chance of recovering some organisms from the operating room which I think he will need to go back to ultimately.  I will also get a Panorex of his teeth which are not bothering him now  but could have been the source of his septic shoulder via hematogenous spread.    Review of Systems: Review of Systems  Constitutional: Positive for chills, diaphoresis, fever and malaise/fatigue.  HENT: Negative for congestion, ear pain, hearing loss and sore throat.   Eyes: Negative for blurred vision and double vision.  Respiratory: Negative for cough, sputum production, shortness of breath, wheezing and stridor.   Cardiovascular: Negative for chest pain,  palpitations and leg swelling.  Gastrointestinal: Negative for abdominal pain, blood in stool, constipation, diarrhea, heartburn, melena, nausea and vomiting.  Genitourinary: Negative for dysuria, flank pain and frequency.  Musculoskeletal: Positive for joint pain and myalgias.  Neurological: Negative for dizziness, sensory change, focal weakness, loss of consciousness, weakness and headaches.  Endo/Heme/Allergies: Does not bruise/bleed easily.  Psychiatric/Behavioral: Negative for depression, substance abuse and suicidal ideas. The patient does not have insomnia.     Past Medical History:  Diagnosis Date  . Diabetes mellitus    takes Janumet and Invokana daily  . Hernia, umbilical   . Hypertension    takes Lisinopril daily  . Plaque psoriasis   . Seasonal allergies    takes Claritin daily and uses Flonase daily    Social History   Tobacco Use  . Smoking status: Never Smoker  . Smokeless tobacco: Never Used  Vaping Use  . Vaping Use: Never used  Substance Use Topics  . Alcohol use: Yes    Comment: occ  . Drug use: No    Family History  Problem Relation Age of Onset  . Hypertension Mother   . Diverticulosis Mother   . Osteoporosis Mother   . Colon polyps Mother   . Diabetes Father   . Alcohol abuse Father   . Osteoporosis Sister   . Hypertension Sister   . Diabetes Brother   . Hypertension Brother   . Colon polyps Brother   . Hypertension Maternal Grandmother   . Heart disease Maternal Grandfather   . Diabetes Paternal Grandfather   . Diabetes Brother   . Hypertension Brother   . Heart attack Brother   . Osteoporosis Sister   . Colon cancer Neg Hx   . Esophageal cancer Neg Hx   . Stomach cancer Neg Hx   . Rectal cancer Neg Hx    Allergies  Allergen Reactions  . Penicillins Hives and Rash    Red rashes when he was a teenager; tolerated cefazolin    OBJECTIVE: Blood pressure 118/85, pulse (!) 111, temperature 99.4 F (37.4 C), temperature source Oral,  resp. rate (!) 22, height 5\' 4"  (1.626 m), weight 72.1 kg, SpO2 91 %.  Physical Exam Constitutional:      General: He is not in acute distress.    Appearance: Normal appearance. He is well-developed. He is diaphoretic. He is not ill-appearing.  HENT:     Head: Normocephalic and atraumatic.     Right Ear: Hearing and external ear normal.     Left Ear: Hearing and external ear normal.     Nose: No nasal deformity or rhinorrhea.  Eyes:     General: No scleral icterus.    Conjunctiva/sclera: Conjunctivae normal.     Right eye: Right conjunctiva is not injected.     Left eye: Left conjunctiva is not injected.     Pupils: Pupils are equal, round, and reactive to light.  Neck:     Vascular: No JVD.  Cardiovascular:     Rate and Rhythm: Regular rhythm. Tachycardia present.     Heart sounds:  Normal heart sounds, S1 normal and S2 normal. No murmur heard. No friction rub. No gallop.   Pulmonary:     Effort: Pulmonary effort is normal. No respiratory distress.     Breath sounds: Normal breath sounds. No rhonchi.  Abdominal:     General: Bowel sounds are normal. There is no distension.     Palpations: Abdomen is soft.     Tenderness: There is no abdominal tenderness.  Musculoskeletal:        General: Normal range of motion.     Right shoulder: Normal.     Left shoulder: Normal.     Cervical back: Normal range of motion and neck supple.     Right hip: Normal.     Left hip: Normal.     Right knee: Normal.     Left knee: Normal.  Lymphadenopathy:     Head:     Right side of head: No submandibular, preauricular or posterior auricular adenopathy.     Left side of head: No submandibular, preauricular or posterior auricular adenopathy.     Cervical: No cervical adenopathy.     Right cervical: No superficial or deep cervical adenopathy.    Left cervical: No superficial or deep cervical adenopathy.  Skin:    General: Skin is warm.     Coloration: Skin is not pale.     Findings: No  abrasion, bruising, ecchymosis, erythema, lesion or rash.     Nails: There is no clubbing.  Neurological:     General: No focal deficit present.     Mental Status: He is alert and oriented to person, place, and time.     Sensory: No sensory deficit.     Coordination: Coordination normal.     Gait: Gait normal.  Psychiatric:        Attention and Perception: He is attentive.        Speech: Speech normal.        Behavior: Behavior normal. Behavior is cooperative.        Thought Content: Thought content normal.        Judgment: Judgment normal.   Left shoulder with sutures in place is warm  No bony abnormalities of the clavicle or tenderness there.  Lab Results Lab Results  Component Value Date   WBC 9.7 11/29/2020   HGB 14.1 11/29/2020   HCT 44.5 11/29/2020   MCV 89.5 11/29/2020   PLT 142 (L) 11/29/2020    Lab Results  Component Value Date   CREATININE 0.85 11/29/2020   BUN 15 11/29/2020   NA 133 (L) 11/29/2020   K 4.2 11/29/2020   CL 99 11/29/2020   CO2 22 11/29/2020    Lab Results  Component Value Date   ALT 63 (H) 11/29/2020   AST 52 (H) 11/29/2020   ALKPHOS 221 (H) 11/29/2020   BILITOT 0.3 11/29/2020     Microbiology: Recent Results (from the past 240 hour(s))  Blood culture (routine single)     Status: None (Preliminary result)   Collection Time: 11/27/20 11:43 AM   Specimen: Blood  Result Value Ref Range Status   MICRO NUMBER: [id number]  Preliminary   SPECIMEN QUALITY: Adequate  Preliminary   Source BLOOD 1  Preliminary   STATUS: PRELIMINARY  Preliminary   Result:   Preliminary    No growth to date. Culture is continuously monitored for a total of 120 hours incubation. A change in status will result in a phone report followed by an updated printed culture report.  COMMENT: Aerobic and anaerobic bottle received.  Preliminary  Blood culture (routine single)     Status: None (Preliminary result)   Collection Time: 11/27/20 11:46 AM   Specimen: Blood   Result Value Ref Range Status   MICRO NUMBER: [id number]  Preliminary   SPECIMEN QUALITY: Adequate  Preliminary   Source BLOOD 2  Preliminary   STATUS: PRELIMINARY  Preliminary   Result:   Preliminary    No growth to date. Culture is continuously monitored for a total of 120 hours incubation. A change in status will result in a phone report followed by an updated printed culture report.   COMMENT: Aerobic and anaerobic bottle received.  [place], [place] [facility] [phone] pager  11/29/2020, 4:34 PM

## 2020-11-29 NOTE — Progress Notes (Addendum)
Pharmacy Antibiotic Note  Logan [name] is a 50 y.o. male admitted on 11/29/2020 presenting with worsening shoulder pain and fever, septic arthritis with recent [facility] visit for same receiving vancomycin and levaquin PTA (has PICC).  Pharmacy has been consulted for vancomycin dosing.  (PTA dosing, levaquin 500mg  PO daily, vancomycin 1000 mg IV every 12 hours until 5/6) Gets vancomycin infusion @1130  and 2330, last dose 4/21 at night, and last dose of Levaquin today  Plan: Per ID recs will hold abx for now F/u new Cx/MRI and further ID recs  Height: 5\' 4"  (162.6 cm) Weight: 72.1 kg (159 lb) IBW/kg (Calculated) : 59.2  Temp (24hrs), Avg:99.4 F (37.4 C), Min:99.4 F (37.4 C), Max:99.4 F (37.4 C)  Recent Labs  Lab 11/29/20 1329  WBC 9.7  CREATININE 0.85  LATICACIDVEN 2.0*    Estimated Creatinine Clearance: 92.6 mL/min (by C-G formula based on SCr of 0.85 mg/dL).    Allergies  Allergen Reactions  . Penicillins Hives and Rash    Red rashes when he was a teenager; tolerated cefazolin    Bertis Ruddy, PharmD Clinical Pharmacist ED Pharmacist Phone # [phone] 11/29/2020 4:34 PM

## 2020-11-29 NOTE — Telephone Encounter (Signed)
Patient returned RN's call. He will get a ride to [facility] ER for evaluation of ongoing fever, progressive tightening and pain in his shoulder. Will let [facility] ID provider know. Landis Gandy, RN

## 2020-11-29 NOTE — ED Notes (Signed)
Patient transported to MRI 

## 2020-11-29 NOTE — ED Provider Notes (Signed)
[facility] EMERGENCY DEPARTMENT Provider Note   CSN: [id number] Arrival date & time: 11/29/20  1252     History Chief Complaint  Patient presents with  . Shoulder Pain  . Fever    Logan [name] is a 50 y.o. male.  HPI 50 year old male with history of DM type II, hypertension, sepsis, recent surgery of his left shoulder with subsequent sepsis presents to the ER with complaints of worsening left shoulder pain and fever, sent here from infectious disease.  Initially presented with sepsis, which was suspected to be coming from his left shoulder patient had a left shoulder arthroscopic incisional drainage and debridement on 4/11 by Dr. Stann [name].  Reportedly a culture was not able to be obtained during the surgery.  Patient was sent home with a PICC line, currently being treated with vancomycin.  He was seen by Dr. Megan [name] with infectious disease several days ago and was also started on levofloxacin given worsening left shoulder pain and fevers.  T-max of 101.4 in the last 24 hours.  Blood cultures reviewed from several days ago did not grow any bacteria but the culture report is still not finalized.  Patient reports worsening left shoulder pain.  Reports compliance with medications.  He was sent by infectious disease with recommendations for admission.  Denies any numbness or tingling.  No history of IV drug use.    Past Medical History:  Diagnosis Date  . Diabetes mellitus    takes Janumet and Invokana daily  . Hernia, umbilical   . Hypertension    takes Lisinopril daily  . Plaque psoriasis   . Seasonal allergies    takes Claritin daily and uses Flonase daily    Patient Active Problem List   Diagnosis Date Noted  . Fever 11/27/2020  . Sepsis ([place]) 11/15/2020  . Septic arthritis of shoulder, left ([place])   . Type 2 diabetes mellitus with diabetic polyneuropathy, with long-term current use of insulin ([name]) 07/05/2019  . Allergic rhinitis with a nonallergic component  12/08/2016  . Psoriasis 12/08/2016  . Diabetes ([place]) 05/31/2012  . Hypertension 05/31/2012    Past Surgical History:  Procedure Laterality Date  . INSERTION OF MESH N/A 05/07/2016   Procedure: INSERTION OF MESH;  Surgeon: Logan Luna, MD;  Location: [facility];  Service: General;  Laterality: N/A;  . SHOULDER ARTHROSCOPY Left 11/18/2020   Procedure: ARTHROSCOPIC INCISIONAL DRAINAGE WITH EXTENSIVE DEBRIDEMENT;  Surgeon: Logan Stairs, MD;  Location: [facility];  Service: Orthopedics;  Laterality: Left;  . TYMPANOSTOMY TUBE PLACEMENT    . VENTRAL HERNIA REPAIR N/A 05/07/2016   Procedure: REPAIR VENTRAL HERNIA;  Surgeon: Logan Luna, MD;  Location: [facility];  Service: General;  Laterality: N/A;       Family History  Problem Relation Age of Onset  . Hypertension Mother   . Diverticulosis Mother   . Osteoporosis Mother   . Colon polyps Mother   . Diabetes Father   . Alcohol abuse Father   . Osteoporosis Sister   . Hypertension Sister   . Diabetes Brother   . Hypertension Brother   . Colon polyps Brother   . Hypertension Maternal Grandmother   . Heart disease Maternal Grandfather   . Diabetes Paternal Grandfather   . Diabetes Brother   . Hypertension Brother   . Heart attack Brother   . Osteoporosis Sister   . Colon cancer Neg Hx   . Esophageal cancer Neg Hx   . Stomach cancer Neg Hx   . Rectal  cancer Neg Hx     Social History   Tobacco Use  . Smoking status: Never Smoker  . Smokeless tobacco: Never Used  Vaping Use  . Vaping Use: Never used  Substance Use Topics  . Alcohol use: Yes    Comment: occ  . Drug use: No    Home Medications Prior to Admission medications   Medication Sig Start Date End Date Taking? Authorizing Provider  acetaminophen (TYLENOL) 325 MG tablet Take 2 tablets (650 mg total) by mouth every 6 (six) hours as needed for mild pain (or Fever >/= 101). 11/22/20   Logan Reasons, MD  aspirin EC 81 MG tablet Take 81 mg by mouth every morning.    [provider]  atorvastatin (LIPITOR) 20 MG tablet TAKE 1 TABLET (20 MG TOTAL) BY MOUTH DAILY AT 6 PM. 08/02/19   Logan Agreste, MD  BAYER MICROLET LANCETS lancets TEST UP TO 3 TIMES A DAY 01/11/18   Logan Agreste, MD  blood glucose meter kit and supplies Dispense based on patient and insurance preference. Use up to 3 times daily, uncontrolled diabetes.  Contour Next meter with test strips and lancets. 11/15/17   Logan Agreste, MD  CHROMIUM-CINNAMON PO Take 1 tablet by mouth 2 (two) times daily.    [provider]  Clobetasol Propionate 0.05 % lotion Apply 1 application topically daily as needed (psoriasis).    [provider]  CONTOUR NEXT TEST test strip TEST UP TO 3 TIMES A DAY 11/12/19   Logan Agreste, MD  cyclobenzaprine (FLEXERIL) 5 MG tablet Take 1 tablet (5 mg total) by mouth 3 (three) times daily as needed for muscle spasms. 11/22/20   Logan Reasons, MD  Dulaglutide (TRULICITY) 3 NL/9.7QB SOPN Inject 3 mg into the skin every Monday.    [provider]  empagliflozin (JARDIANCE) 25 MG TABS tablet Take 25 mg by mouth every morning.    [provider]  fluticasone (FLONASE) 50 MCG/ACT nasal spray PLACE 1-2 SPRAYS INTO BOTH NOSTRILS DAILY. Patient taking differently: Place 1-2 sprays into both nostrils daily as needed for allergies or rhinitis. 05/10/19   Logan Agreste, MD  gabapentin (NEURONTIN) 300 MG capsule Take 1 capsule (300 mg total) by mouth at bedtime. 11/22/20   Logan Reasons, MD  insulin aspart (NOVOLOG) 100 UNIT/ML FlexPen Insulin sliding scale: Blood sugar  120-150   3units                       151-200   4units                       201-250   7units                       251- 300  11units                       301-350   15uints                       351-400   20units                       >400         call MD immediately 11/22/20   Logan Reasons, MD  Insulin Pen Needle 32G X 4 MM MISC 4x daily 11/22/20   Logan Reasons, MD  levofloxacin (  LEVAQUIN)  500 MG tablet Take 1 tablet (500 mg total) by mouth daily. 11/27/20   Logan Bickers, MD  lisinopril (PRINIVIL,ZESTRIL) 10 MG tablet Take 1 tablet (10 mg total) by mouth daily. 10/06/18   Logan Agreste, MD  loratadine (CLARITIN) 10 MG tablet Take 10 mg by mouth daily as needed for allergies, rhinitis or itching.    [provider]  meloxicam (MOBIC) 15 MG tablet Take 1 tablet (15 mg total) by mouth 2 (two) times daily as needed for pain. 11/22/20   Logan Reasons, MD  metFORMIN (GLUCOPHAGE) 1000 MG tablet TAKE 1 TABLET (1,000 MG TOTAL) BY MOUTH 2 (TWO) TIMES DAILY WITH A MEAL. 07/27/19   Logan Agreste, MD  Multiple Vitamin (MULTIVITAMIN WITH MINERALS) TABS tablet Take 1 tablet by mouth every morning. Men's 50 plus    [provider]  oxyCODONE 10 MG TABS Take 1 tablet (10 mg total) by mouth every 6 (six) hours as needed for moderate pain. 11/22/20   Logan Reasons, MD  polyethylene glycol (MIRALAX / GLYCOLAX) 17 g packet Take 17 g by mouth daily. 11/23/20   Logan Reasons, MD  senna-docusate (SENOKOT-S) 8.6-50 MG tablet Take 1 tablet by mouth 2 (two) times daily for 7 days. 11/22/20 11/29/20  Logan Reasons, MD  vancomycin IVPB Inject 1,000 mg into the vein every 12 (twelve) hours for 21 days. Indication:  septic shoulder First Dose: Yes Last Day of Therapy:  12/13/2020 Labs - Sunday/Monday:  CBC/D, BMP, and vancomycin trough. Labs - Thursday:  BMP and vancomycin trough Labs - Every other week:  ESR and CRP Method of administration:Elastomeric Method of administration may be changed at the discretion of the patient and/or caregiver's ability to self-administer the medication ordered. 11/22/20 12/13/20  Logan Reasons, MD  vitamin B-12 (CYANOCOBALAMIN) 1000 MCG tablet Take 1 tablet (1,000 mcg total) by mouth daily. 11/22/20   Logan Reasons, MD    Allergies    Penicillins  Review of Systems   Review of Systems  Constitutional: Positive for fever. Negative for chills.  HENT: Negative for ear pain and sore throat.    Eyes: Negative for pain and visual disturbance.  Respiratory: Negative for cough and shortness of breath.   Cardiovascular: Negative for chest pain and palpitations.  Gastrointestinal: Negative for abdominal pain and vomiting.  Genitourinary: Negative for dysuria and hematuria.  Musculoskeletal: Positive for arthralgias. Negative for back pain.  Skin: Negative for color change and rash.  Neurological: Negative for seizures and syncope.  All other systems reviewed and are negative.   Physical Exam Updated Vital Signs BP 118/85   Pulse (!) 111   Temp 99.4 F (37.4 C) (Oral)   Resp (!) 22   Ht $R'5\' 4"'XJ$  (1.626 m)   Wt 72.1 kg   SpO2 91%   BMI 27.29 kg/m   Physical Exam Vitals and nursing note reviewed.  Constitutional:      Appearance: He is well-developed.  HENT:     Head: Normocephalic and atraumatic.  Eyes:     Conjunctiva/sclera: Conjunctivae normal.  Cardiovascular:     Rate and Rhythm: Normal rate and regular rhythm.     Heart sounds: No murmur heard.   Pulmonary:     Effort: Pulmonary effort is normal. No respiratory distress.     Breath sounds: Normal breath sounds.  Abdominal:     Palpations: Abdomen is soft.     Tenderness: There is no abdominal tenderness.  Musculoskeletal:        General: Tenderness  present. Normal range of motion.     Cervical back: Neck supple.     Right lower leg: No edema.     Left lower leg: No edema.     Comments: No surrounding erythema, fluctuance, drainage around the PICC line.  Left shoulder with limited active range of motion, I am able to raise his left shoulder above midline passively but very slowly as the patient has a severe amount of pain.  He appears to have muscle spasms with movement as well.  Skin:    General: Skin is warm and dry.     Findings: No erythema.  Neurological:     General: No focal deficit present.     Mental Status: He is alert and oriented to person, place, and time.  Psychiatric:        Mood and  Affect: Mood normal.        Behavior: Behavior normal.     ED Results / Procedures / Treatments   Labs (all labs ordered are listed, but only abnormal results are displayed) Labs Reviewed  LACTIC ACID, PLASMA - Abnormal; Notable for the following components:      Result Value   Lactic Acid, Venous 2.0 (*)    All other components within normal limits  COMPREHENSIVE METABOLIC PANEL - Abnormal; Notable for the following components:   Sodium 133 (*)    Glucose, Bld 188 (*)    Albumin 2.8 (*)    AST 52 (*)    ALT 63 (*)    Alkaline Phosphatase 221 (*)    All other components within normal limits  CBC WITH DIFFERENTIAL/PLATELET - Abnormal; Notable for the following components:   Platelets 142 (*)    Monocytes Absolute 1.4 (*)    Basophils Absolute 0.2 (*)    Abs Immature Granulocytes 0.17 (*)    All other components within normal limits  CULTURE, BLOOD (ROUTINE X 2)  CULTURE, BLOOD (ROUTINE X 2)  URINE CULTURE  RESP PANEL BY RT-PCR (FLU A&B, COVID) ARPGX2  PROTIME-INR  APTT  LACTIC ACID, PLASMA  URINALYSIS, ROUTINE W REFLEX MICROSCOPIC    EKG None  Radiology DG Chest 1 View  Result Date: 11/29/2020 CLINICAL DATA:  Shoulder pain.  Fever EXAM: CHEST  1 VIEW COMPARISON:  11/15/2020 FINDINGS: Interval placement of a right-sided PICC line with distal tip terminating at the level of the distal SVC. The heart size and mediastinal contours are within normal limits. Low lung volumes with mild bibasilar atelectasis. No pleural effusion or pneumothorax. The visualized skeletal structures are unremarkable. IMPRESSION: Low lung volumes with bibasilar atelectasis. Electronically Signed   By: Davina Poke D.O.   On: 11/29/2020 13:56    Procedures Procedures   Medications Ordered in ED Medications  LORazepam (ATIVAN) tablet 0.5 mg (has no administration in time range)  LORazepam (ATIVAN) injection 0.5 mg (has no administration in time range)  LORazepam (ATIVAN) injection 1 mg (has  no administration in time range)  sodium chloride 0.9 % bolus 1,000 mL (has no administration in time range)    ED Course  I have reviewed the triage vital signs and the nursing notes.  Pertinent labs & imaging results that were available during my care of the patient were reviewed by me and considered in my medical decision making (see chart for details).    MDM Rules/Calculators/A&P                          50 year old male  with left shoulder pain, recent left shoulder surgery and subsequent developed sepsis.  Currently on vancomycin and levofloxacin.  On arrival, blood pressure reassuring, borderline temp of 99.4, tachycardic at 133 but this did improve throughout the ED course.  Patient states that he has a prior history of tachycardia, reports that approximately a year ago he was scheduled to have an echo and a cardiac evaluation but lost his insurance and never followed up.  He denies any chest pain or shortness of breath.  No evidence of hypoxia here.  Sickle exam with limited range of motion of the left shoulder actively, he is able to passively range it above midline but does have significant pain.  No evidence of infection to the PICC line.  's and imaging ordered, reviewed and interpreted by me.  CBC without leukocytosis, CMP without any electrode abnormalities, normal renal function, mild transaminitis of 52 and 63 respectively with an elevated alk phos of 221.   His lactic acid was elevated here at 2.  Chest x-ray without any acute abnormalities.  I spoke with Dr. Drucilla Schmidt with infectious disease.  He reviewed the patient's chart, recommends Ortho evaluation, stopping levofloxacin, and likely requiring admission.  Consulted orthopedics, patient was seen and evaluated by Orion Crook, PA-C.  He recommends admission, repeat MRI.  Infectious disease and orthopedics will follow.  Consulted hospitalist team for admission  Spoke with Dr. Roosevelt Locks who will admit the patient for further  evaluation and treatment.  Patient mains hemodynamically stable here in the ER   Final Clinical Impression(s) / ED Diagnoses Final diagnoses:  Pyogenic arthritis of left shoulder region, due to unspecified organism [facility])    Rx / [place] Orders ED Discharge Orders    None       Lyndel Safe 11/29/20 1553    Daleen Bo, MD 11/30/20 [phone]

## 2020-11-29 NOTE — ED Triage Notes (Signed)
Pt had recent surgery on his left shoulder, currently receiving vancomycin through right arm PICC line at home, pt reports fevers at home for the last 2 days, 101.4 along with tightness around his shoulder. Sent here by ID doctor for further evaluation. Afebrile in triage at this time. No redness or swelling at PICC line site and no redness at incision site.

## 2020-11-29 NOTE — Telephone Encounter (Signed)
Patient called office today requesting call from Dr. Megan Salon. Patient states that he feels like his symptoms are getting worse from two days ago. Is still complaining of arm pain/tightness and fever (101.4). Patient would like to be directly admitted for complete work up.  Advised patient if he feels like he needs to be seen urgently to go ahead to ED. No available appointments today with office.  Patient would also like update on FMLA paperwork. Employer is asking these forms be faxed by 5/3. Leatrice Jewels, RMA

## 2020-11-29 NOTE — Consult Note (Signed)
Reason for Consult:Left shoulder pain Referring Physician: Crosby Oyster Time called: 1440 Time at bedside: [place] is an 50 y.o. male.  HPI: Logan [name] is well known to the orthopedic service after undergoing shoulder arthroscopy earlier this month for septic arthritis. He had been doing ok after surgery and was discharged but began to have increased pain earlier this week and started running fevers over the last few days up to 101.4. He saw ID who instructed him to come back to ED and requested ortho re-eval.  Past Medical History:  Diagnosis Date  . Diabetes mellitus    takes Janumet and Invokana daily  . Hernia, umbilical   . Hypertension    takes Lisinopril daily  . Plaque psoriasis   . Seasonal allergies    takes Claritin daily and uses Flonase daily    Past Surgical History:  Procedure Laterality Date  . INSERTION OF MESH N/A 05/07/2016   Procedure: INSERTION OF MESH;  Surgeon: Erroll Luna, MD;  Location: [facility];  Service: General;  Laterality: N/A;  . SHOULDER ARTHROSCOPY Left 11/18/2020   Procedure: ARTHROSCOPIC INCISIONAL DRAINAGE WITH EXTENSIVE DEBRIDEMENT;  Surgeon: Nicholes Stairs, MD;  Location: [facility];  Service: Orthopedics;  Laterality: Left;  . TYMPANOSTOMY TUBE PLACEMENT    . VENTRAL HERNIA REPAIR N/A 05/07/2016   Procedure: REPAIR VENTRAL HERNIA;  Surgeon: Erroll Luna, MD;  Location: [facility];  Service: General;  Laterality: N/A;    Family History  Problem Relation Age of Onset  . Hypertension Mother   . Diverticulosis Mother   . Osteoporosis Mother   . Colon polyps Mother   . Diabetes Father   . Alcohol abuse Father   . Osteoporosis Sister   . Hypertension Sister   . Diabetes Brother   . Hypertension Brother   . Colon polyps Brother   . Hypertension Maternal Grandmother   . Heart disease Maternal Grandfather   . Diabetes Paternal Grandfather   . Diabetes Brother   . Hypertension Brother   . Heart attack Brother   . Osteoporosis Sister    . Colon cancer Neg Hx   . Esophageal cancer Neg Hx   . Stomach cancer Neg Hx   . Rectal cancer Neg Hx     Social History:  reports that he has never smoked. He has never used smokeless tobacco. He reports current alcohol use. He reports that he does not use drugs.  Allergies:  Allergies  Allergen Reactions  . Penicillins Hives and Rash    Red rashes when he was a teenager; tolerated cefazolin    Medications: I have reviewed the patient's current medications.  Results for orders placed or performed during the hospital encounter of 11/29/20 (from the past 48 hour(s))  Lactic acid, plasma     Status: Abnormal   Collection Time: 11/29/20  1:29 PM  Result Value Ref Range   Lactic Acid, Venous 2.0 (HH) 0.5 - 1.9 mmol/L    Comment: CRITICAL RESULT CALLED TO, READ BACK BY AND VERIFIED WITH: C.ROWE,RN @1455  11/29/2020 [email or web address] Performed at [facility], [place] [address] [place], [place] [address]   Comprehensive metabolic panel     Status: Abnormal   Collection Time: 11/29/20  1:29 PM  Result Value Ref Range   Sodium 133 (L) 135 - 145 mmol/L   Potassium 4.2 3.5 - 5.1 mmol/L   Chloride 99 98 - 111 mmol/L   CO2 22 22 - 32 mmol/L   Glucose, Bld 188 (H) 70 -  99 mg/dL    Comment: Glucose reference range applies only to samples taken after fasting for at least 8 hours.   BUN 15 6 - 20 mg/dL   Creatinine, Ser 0.85 0.61 - 1.24 mg/dL   Calcium 8.9 8.9 - 10.3 mg/dL   Total Protein 8.0 6.5 - 8.1 g/dL   Albumin 2.8 (L) 3.5 - 5.0 g/dL   AST 52 (H) 15 - 41 U/L   ALT 63 (H) 0 - 44 U/L   Alkaline Phosphatase 221 (H) 38 - 126 U/L   Total Bilirubin 0.3 0.3 - 1.2 mg/dL   GFR, Estimated >60 >60 mL/min    Comment: (NOTE) Calculated using the CKD-EPI Creatinine Equation (2021)    Anion gap 12 5 - 15    Comment: Performed at [facility], [place] [address] [place], [place] [address]  CBC WITH DIFFERENTIAL     Status: Abnormal   Collection Time: 11/29/20  1:29 PM  Result Value Ref Range    WBC 9.7 4.0 - 10.5 K/uL   RBC 4.97 4.22 - 5.81 MIL/uL   Hemoglobin 14.1 13.0 - 17.0 g/dL   HCT 44.5 39.0 - 52.0 %   MCV 89.5 80.0 - 100.0 fL   MCH 28.4 26.0 - 34.0 pg   MCHC 31.7 30.0 - 36.0 g/dL   RDW 13.2 11.5 - 15.5 %   Platelets 142 (L) 150 - 400 K/uL    Comment: Immature Platelet Fraction may be clinically indicated, consider ordering this additional test [id number]    nRBC 0.0 0.0 - 0.2 %   Neutrophils Relative % 61 %   Neutro Abs 6.1 1.7 - 7.7 K/uL   Lymphocytes Relative 19 %   Lymphs Abs 1.8 0.7 - 4.0 K/uL   Monocytes Relative 15 %   Monocytes Absolute 1.4 (H) 0.1 - 1.0 K/uL   Eosinophils Relative 1 %   Eosinophils Absolute 0.1 0.0 - 0.5 K/uL   Basophils Relative 2 %   Basophils Absolute 0.2 (H) 0.0 - 0.1 K/uL   Immature Granulocytes 2 %   Abs Immature Granulocytes 0.17 (H) 0.00 - 0.07 K/uL    Comment: Performed at [facility] [address], [place], [place] [address]  Protime-INR     Status: None   Collection Time: 11/29/20  1:29 PM  Result Value Ref Range   Prothrombin Time 13.8 11.4 - 15.2 seconds   INR 1.1 0.8 - 1.2    Comment: (NOTE) INR goal varies based on device and disease states. Performed at [facility], [place] [address], [place], [place] [address]   APTT     Status: None   Collection Time: 11/29/20  1:29 PM  Result Value Ref Range   aPTT 31 24 - 36 seconds    Comment: Performed at [facility] [address], [place],  [address]    DG Chest 1 View  Result Date: 11/29/2020 CLINICAL DATA:  Shoulder pain.  Fever EXAM: CHEST  1 VIEW COMPARISON:  11/15/2020 FINDINGS: Interval placement of a right-sided PICC line with distal tip terminating at the level of the distal SVC. The heart size and mediastinal contours are within normal limits. Low lung volumes with mild bibasilar atelectasis. No pleural effusion or pneumothorax. The visualized skeletal structures are unremarkable. IMPRESSION: Low lung volumes with bibasilar atelectasis.  Electronically Signed   By: Davina Poke D.O.   On: 11/29/2020 13:56    Review of Systems  Constitutional: Positive for fever. Negative for  chills and diaphoresis.  HENT: Negative for ear discharge, ear pain, hearing loss and tinnitus.   Eyes: Negative for photophobia and pain.  Respiratory: Negative for cough and shortness of breath.   Cardiovascular: Negative for chest pain.  Gastrointestinal: Negative for abdominal pain, nausea and vomiting.  Genitourinary: Negative for dysuria, flank pain, frequency and urgency.  Musculoskeletal: Positive for arthralgias (Left shoulder). Negative for back pain, myalgias and neck pain.  Neurological: Negative for dizziness and headaches.  Hematological: Does not bruise/bleed easily.  Psychiatric/Behavioral: The patient is not nervous/anxious.    Blood pressure 126/84, pulse (!) 113, temperature 99.4 F (37.4 C), temperature source Oral, resp. rate (!) 29, height 5\' 4"  (1.626 m), weight 72.1 kg, SpO2 91 %. Physical Exam Constitutional:      General: He is not in acute distress.    Appearance: He is well-developed. He is not diaphoretic.  HENT:     Head: Normocephalic and atraumatic.  Eyes:     General: No scleral icterus.       Right eye: No discharge.        Left eye: No discharge.     Conjunctiva/sclera: Conjunctivae normal.  Cardiovascular:     Rate and Rhythm: Normal rate and regular rhythm.  Pulmonary:     Effort: Pulmonary effort is normal. No respiratory distress.  Musculoskeletal:     Cervical back: Normal range of motion.     Comments: Left shoulder, elbow, wrist, digits- Arthroscopy sites WNL, severe TTP ant/post shoulder joint line and esp subacromial, AC joint NT, essentially no AROM 2/2 pain, no instability, no blocks to motion  Sens  Ax/R/M/U intact  Mot   Ax/ R/ PIN/ M/ AIN/ U intact  Rad 2+  Skin:    General: Skin is warm and dry.  Neurological:     Mental Status: He is alert.  Psychiatric:        Behavior: Behavior  normal.     Assessment/Plan: Left shoulder septic arthritis -- Will repeat MRI given new symptoms. Dr. Stann Mainland to f/u once done. Given tachycardia will likely need admission and ID consult while here.    Lisette Abu, PA-C Orthopedic Surgery [phone] 11/29/2020, 3:16 PM

## 2020-11-29 NOTE — Telephone Encounter (Signed)
[facility] -- [facility]!

## 2020-11-30 ENCOUNTER — Encounter (HOSPITAL_COMMUNITY): Payer: Self-pay | Admitting: Internal Medicine

## 2020-11-30 ENCOUNTER — Inpatient Hospital Stay (HOSPITAL_COMMUNITY): Payer: Commercial Managed Care - PPO

## 2020-11-30 DIAGNOSIS — R509 Fever, unspecified: Secondary | ICD-10-CM | POA: Diagnosis not present

## 2020-11-30 DIAGNOSIS — M60012 Infective myositis, left shoulder: Secondary | ICD-10-CM

## 2020-11-30 DIAGNOSIS — M009 Pyogenic arthritis, unspecified: Secondary | ICD-10-CM | POA: Diagnosis not present

## 2020-11-30 DIAGNOSIS — M25511 Pain in right shoulder: Secondary | ICD-10-CM

## 2020-11-30 DIAGNOSIS — E1169 Type 2 diabetes mellitus with other specified complication: Secondary | ICD-10-CM

## 2020-11-30 DIAGNOSIS — R7401 Elevation of levels of liver transaminase levels: Secondary | ICD-10-CM

## 2020-11-30 LAB — COMPREHENSIVE METABOLIC PANEL
ALT: 61 U/L — ABNORMAL HIGH (ref 0–44)
AST: 44 U/L — ABNORMAL HIGH (ref 15–41)
Albumin: 2.5 g/dL — ABNORMAL LOW (ref 3.5–5.0)
Alkaline Phosphatase: 194 U/L — ABNORMAL HIGH (ref 38–126)
Anion gap: 8 (ref 5–15)
BUN: 16 mg/dL (ref 6–20)
CO2: 24 mmol/L (ref 22–32)
Calcium: 8.7 mg/dL — ABNORMAL LOW (ref 8.9–10.3)
Chloride: 102 mmol/L (ref 98–111)
Creatinine, Ser: 0.68 mg/dL (ref 0.61–1.24)
GFR, Estimated: >60 mL/min (ref >60)
Glucose, Bld: 101 mg/dL — ABNORMAL HIGH (ref 70–99)
Potassium: 4 mmol/L (ref 3.5–5.1)
Sodium: 134 mmol/L — ABNORMAL LOW (ref 135–145)
Total Bilirubin: 0.6 mg/dL (ref 0.3–1.2)
Total Protein: 7.2 g/dL (ref 6.5–8.1)

## 2020-11-30 LAB — CBC
HCT: 41.3 % (ref 39.0–52.0)
Hemoglobin: 13 g/dL (ref 13.0–17.0)
MCH: 28.4 pg (ref 26.0–34.0)
MCHC: 31.5 g/dL (ref 30.0–36.0)
MCV: 90.4 fL (ref 80.0–100.0)
Platelets: 208 10*3/uL (ref 150–400)
RBC: 4.57 MIL/uL (ref 4.22–5.81)
RDW: 13.2 % (ref 11.5–15.5)
WBC: 8.5 10*3/uL (ref 4.0–10.5)
nRBC: 0 % (ref 0.0–0.2)

## 2020-11-30 LAB — URINE CULTURE: Culture: NO GROWTH

## 2020-11-30 LAB — GLUCOSE, CAPILLARY
Glucose-Capillary: 105 mg/dL — ABNORMAL HIGH (ref 70–99)
Glucose-Capillary: 116 mg/dL — ABNORMAL HIGH (ref 70–99)
Glucose-Capillary: 122 mg/dL — ABNORMAL HIGH (ref 70–99)
Glucose-Capillary: 136 mg/dL — ABNORMAL HIGH (ref 70–99)

## 2020-11-30 LAB — SEDIMENTATION RATE: Sed Rate: 95 mm/hr — ABNORMAL HIGH (ref 0–16)

## 2020-11-30 LAB — URIC ACID: Uric Acid, Serum: 3.5 mg/dL — ABNORMAL LOW (ref 3.7–8.6)

## 2020-11-30 LAB — C-REACTIVE PROTEIN: CRP: 4.6 mg/dL — ABNORMAL HIGH (ref <1.0)

## 2020-11-30 MED ORDER — MELATONIN 5 MG PO TABS
10.0000 mg | ORAL_TABLET | Freq: Every evening | ORAL | Status: DC | PRN
  Administered 2020-11-30 – 2020-12-02 (×4): 10 mg via ORAL
  Filled 2020-11-30 (×4): qty 2

## 2020-11-30 MED ORDER — LISINOPRIL 10 MG PO TABS
10.0000 mg | ORAL_TABLET | Freq: Every day | ORAL | Status: DC
  Administered 2020-11-30 – 2020-12-03 (×4): 10 mg via ORAL
  Filled 2020-11-30 (×4): qty 1

## 2020-11-30 MED ORDER — HYDROMORPHONE HCL 1 MG/ML IJ SOLN
0.5000 mg | INTRAMUSCULAR | Status: DC | PRN
  Administered 2020-11-30 – 2020-12-03 (×4): 0.5 mg via INTRAVENOUS
  Filled 2020-11-30 (×4): qty 1

## 2020-11-30 NOTE — Progress Notes (Signed)
VASCULAR LAB    Right upper extremity venous duplex has been performed.  See CV proc for preliminary results.   Peony Barner, RVT 11/30/2020, 2:17 PM

## 2020-11-30 NOTE — Progress Notes (Signed)
[facility]  Date of Admission:  11/29/2020           Reason for visit: Follow up on left shoulder infection  Current antibiotics: None  Previous antibiotics: Vancomycin and Levaquin prior to admission  ASSESSMENT & PLAN:    #Recurrent/persistent septic left shoulder: With concern for development of osteomyelitis status post initial incision and drainage 11/18/2020 #Myositis: Noted on MRI #Fevers: Tmax last  24 hrs is 99.4  -Appreciate orthopedic surgery evaluation -Continue holding antibiotics pending possible return to the OR to increase culture yield as he is otherwise stable.  If clinically were to decompensate would add vancomycin and ceftriaxone -Follow cultures -Await right arm duplex study  #Tooth infection in January  -Orthopantogram negative  #Penicillin allergy: Reports rash 20 to 30 years ago  #Mild transaminitis: New compared to previous levels on admission.  Statin being held by primary team.  Hepatitis panel was negative 11/22/2020  -Continue to monitor   Active Problems:   Septic arthritis of shoulder, left (HCC)   Septic arthritis (HCC)    MEDICATIONS:    Scheduled Meds: . alteplase  2 mg Intracatheter Once  . aspirin EC  81 mg Oral q morning  . Chlorhexidine Gluconate Cloth  6 each Topical Daily  . empagliflozin  25 mg Oral q morning  . enoxaparin (LOVENOX) injection  40 mg Subcutaneous Q24H  . gabapentin  300 mg Oral QHS  . insulin aspart  0-15 Units Subcutaneous TID WC  . metFORMIN  1,000 mg Oral BID WC  . senna-docusate  1 tablet Oral BID   Continuous Infusions: PRN Meds:.acetaminophen, cyclobenzaprine, hydrALAZINE, loratadine, melatonin, oxyCODONE, polyethylene glycol, sodium chloride flush  SUBJECTIVE:   24 hour events:  No acute events noted overnight Afebrile, T-max 99.4 WBC normal Stable elevation of LFTs 4/20 blood cultures from clinic remain negative Blood cultures drawn yesterday at admission  remain negative Orthopantogram negative MRI left shoulder compatible with septic arthritis of the left glenohumeral joint and subacromial/subdeltoid septic bursitis with interval development of bone marrow edema at the posterior aspect of the greater tuberosity underlying the infraspinatus tendon insertion site with small erosion concerning for possible development of osteomyelitis.  Also noted intramuscular edema throughout the rotator cuff musculature as well as the deltoid musculature suggesting myositis. Antibiotics being held  Patient reports no acute complaints this morning.  He reports that his pain is not adequately controlled.  Currently denies fevers.  Has pain in his left shoulder that is worse with movement and with palpation.    Review of Systems  All other systems reviewed and are negative.     OBJECTIVE:   Blood pressure 132/87, pulse 98, temperature 98.9 F (37.2 C), temperature source Oral, resp. rate 18, height 5\' 4"  (1.626 m), weight 75 kg, SpO2 99 %. Body mass index is 28.39 kg/m.  Physical Exam Constitutional:      General: He is not in acute distress.    Appearance: Normal appearance.  Pulmonary:     Effort: No respiratory distress.  Musculoskeletal:        General: Tenderness present.     Comments: Surgical incisions without evidence of infection Right upper extremity PICC line clean/dry/intact Mild tenderness to palpation of his left shoulder Decreased range of motion secondary to pain of left shoulder  Skin:    General: Skin is warm and dry.     Findings: No erythema.  Neurological:     General: No focal deficit present.  Mental Status: He is alert and oriented to person, place, and time.  Psychiatric:        Mood and Affect: Mood normal.        Behavior: Behavior normal.      Lab Results: Lab Results  Component Value Date   WBC 8.5 11/30/2020   HGB 13.0 11/30/2020   HCT 41.3 11/30/2020   MCV 90.4 11/30/2020   PLT 208 11/30/2020    Lab  Results  Component Value Date   NA 134 (L) 11/30/2020   K 4.0 11/30/2020   CO2 24 11/30/2020   GLUCOSE 101 (H) 11/30/2020   BUN 16 11/30/2020   CREATININE 0.68 11/30/2020   CALCIUM 8.7 (L) 11/30/2020   GFRNONAA >60 11/30/2020   GFRAA 113 02/23/2019    Lab Results  Component Value Date   ALT 61 (H) 11/30/2020   AST 44 (H) 11/30/2020   ALKPHOS 194 (H) 11/30/2020   BILITOT 0.6 11/30/2020       Component Value Date/Time   CRP 20.4 (H) 11/15/2020 1326       Component Value Date/Time   ESRSEDRATE 104 (H) 11/15/2020 1326     I have reviewed the micro and lab results in Epic.  Imaging: DG Orthopantogram  Result Date: 11/29/2020 CLINICAL DATA:  Fever and chills EXAM: ORTHOPANTOGRAM/PANORAMIC COMPARISON:  None. FINDINGS: Visualized paranasal sinuses are clear. Dental hardware. No visualized dental caries or periapical lucencies. IMPRESSION: No acute abnormality visualized. Electronically Signed   By: Dahlia Bailiff MD   On: 11/29/2020 20:28   DG Chest 1 View  Result Date: 11/29/2020 CLINICAL DATA:  Shoulder pain.  Fever EXAM: CHEST  1 VIEW COMPARISON:  11/15/2020 FINDINGS: Interval placement of a right-sided PICC line with distal tip terminating at the level of the distal SVC. The heart size and mediastinal contours are within normal limits. Low lung volumes with mild bibasilar atelectasis. No pleural effusion or pneumothorax. The visualized skeletal structures are unremarkable. IMPRESSION: Low lung volumes with bibasilar atelectasis. Electronically Signed   By: Davina Poke D.O.   On: 11/29/2020 13:56   MR SHOULDER LEFT W WO CONTRAST  Result Date: 11/29/2020 CLINICAL DATA:  Shoulder pain.  History of septic arthritis EXAM: MRI OF THE LEFT SHOULDER WITHOUT AND WITH CONTRAST TECHNIQUE: Multiplanar, multisequence MR imaging of the left shoulder was performed before and after the administration of intravenous contrast. CONTRAST:  86mL GADAVIST GADOBUTROL 1 MMOL/ML IV SOLN COMPARISON:   MRI 11/16/2020 FINDINGS: Rotator cuff: Rotator cuff is grossly intact. Severe supraspinatus and moderate infraspinatus tendinosis. Low-grade interstitial tearing of the distal supraspinatus tendon. No full-thickness rotator cuff tear. Muscles: Patchy intramuscular edema throughout the rotator cuff musculature as well as the deltoid musculature. Biceps long head: Nonvisualization of the intra-articular portion of the long head biceps tendon which may reflect tear or prior tenodesis/tenotomy. Acromioclavicular Joint: Mild arthropathy of the AC joint. Moderate sized complex subacromial-subdeltoid bursal fluid collection with thick peripheral enhancement (series 10, image 16). Glenohumeral Joint: Moderate-sized complex glenohumeral joint effusion with thickened, enhancing synovium. No cartilage defect or erosion. Labrum:  Persistent superior labral tear. Bones: Interval development of bone marrow edema within the greater tuberosity underlying the infraspinatus tendon insertion site. Subcortical cyst or erosion at the greater tuberosity posteriorly (series 6, image 6). No fracture or dislocation. Other: Diffuse soft tissue swelling about the shoulder. IMPRESSION: 1. Findings compatible with septic arthritis of the left glenohumeral joint and subacromial/subdeltoid septic bursitis. 2. Interval development of bone marrow edema at the posterior aspect of the  greater tuberosity underlying the infraspinatus tendon insertion site with possible small erosion. Findings are concerning for developing osteomyelitis. 3. Patchy intramuscular edema throughout the rotator cuff musculature as well as the deltoid musculature suggesting myositis. Electronically Signed   By: Davina Poke D.O.   On: 11/29/2020 19:22     Imaging independently reviewed in Epic.    [facility] [phone] pager 11/30/2020, 9:08 AM  I spent greater than 35 minutes with the  patient including greater than 50% of time in face to face counsel of the patient and in coordination of their care.

## 2020-11-30 NOTE — Progress Notes (Addendum)
Progress Note    Logan [name]  DTO:[id number] DOB: 1970/09/26  DOA: 11/29/2020 PCP: Rudene Anda, MD    Brief Narrative:     Medical records reviewed and are as summarized below:  Logan [name] is an 50 y.o. male with medical history significant of IDDM, HTN, HLD, recent diagnosed left shoulder septic arthritis, presented with worsening of left shoulder pain and fever.Patient was admitted on April 9th for left shoulder septic arthritis.  Arthroscopic incision and drainage and debridement done on 04/11.  According to OR record, culture was not able to obtain.  Sent home based IV vancomycin via right arm PICC line.  And patient been following with orthopedic surgery as well as infectious disease.  Patient reported that the left shoulder pain never went away, and on the contrary has been gradually getting worse.  Assessment/Plan:   Active Problems:   Septic arthritis of shoulder, left (HCC)   Septic arthritis (HCC)   Left shoulder septic arthritis vs another inflammatory condition -Failed outpatient IV antibiotic treatment -ID consulted in ED, who recommend discontinue Levaquin and continue Vancomycin. -Ortho will see patient-- suggested SED/CRP and possible IR joint aspiration with synovial fluid studies-- informed by IR RN that this aspiration can not be done until Monday -MRI shoulder:  1. Findings compatible with septic arthritis of the left glenohumeral joint and subacromial/subdeltoid septic bursitis. 2. Interval development of bone marrow edema at the posterior aspect of the greater tuberosity underlying the infraspinatus tendon insertion site with possible small erosion. Findings are concerning for developing osteomyelitis. 3. Patchy intramuscular edema throughout the rotator cuff musculature as well as the deltoid musculature suggesting myositis.  Sinus tachycardia -With mild elevation of lactate level, suspect early sepsis, will give 1 bolus of IV fluids -add IV  Pain meds-- requesting dilaudid  Mild transaminitis -New compared to the level on last admission. No RUQ pain. Will hold Statin for now.  IDDM with hyperglycemia -Recent A1C 8.3% -Continue Jardiance -Add sliding scale.  HTN -resume home meds  HLD -Hold Statin as above.   Family Communication/Anticipated D/C date and plan/Code Status   DVT prophylaxis: Lovenox ordered. Code Status: Full Code.  Disposition Plan: Status is: Inpatient  Remains inpatient appropriate because:Inpatient level of care appropriate due to severity of illness   Dispo: The patient is from: Home              Anticipated d/c is to: Home              Patient currently is not medically stable to d/c.   Difficult to place patient No         Medical Consultants:    ID  ortho   Anti-Infectives:    Holding pending surgery  Subjective:   Pain uncontrolled Asking about if he will have surgery or not  Objective:    Vitals:   11/29/20 2058 11/29/20 2300 11/30/20 0257 11/30/20 1228  BP: (!) 136/97 (!) 124/96 132/87 (!) 128/92  Pulse: (!) 102 97 98 (!) 102  Resp: 16 16 18 16   Temp: 99.3 F (37.4 C) 98.7 F (37.1 C) 98.9 F (37.2 C) 98.8 F (37.1 C)  TempSrc: Oral Oral Oral Oral  SpO2: 95% 98% 99% 98%  Weight:      Height:        Intake/Output Summary (Last 24 hours) at 11/30/2020 1344 Last data filed at 11/30/2020 1317 Gross per 24 hour  Intake 600 ml  Output --  Net 600 ml  Filed Weights   11/29/20 1306 11/29/20 1900  Weight: 72.1 kg 75 kg    Exam:  General: Appearance:     Overweight male in no acute distress- up walking in room- arm held tightly to side     Lungs:     respirations unlabored  Heart:    Tachycardic. Normal rhythm. No murmurs, rubs, or gallops.   MS:   All extremities are intact.   Neurologic:   Awake, alert    Data Reviewed:   I have personally reviewed following labs and imaging studies:  Labs: Labs show the following:   Basic  Metabolic Panel: Recent Labs  Lab 11/29/20 1329 11/30/20 0251  NA 133* 134*  K 4.2 4.0  CL 99 102  CO2 22 24  GLUCOSE 188* 101*  BUN 15 16  CREATININE 0.85 0.68  CALCIUM 8.9 8.7*   GFR Estimated Creatinine Clearance: 100.1 mL/min (by C-G formula based on SCr of 0.68 mg/dL). Liver Function Tests: Recent Labs  Lab 11/29/20 1329 11/30/20 0251  AST 52* 44*  ALT 63* 61*  ALKPHOS 221* 194*  BILITOT 0.3 0.6  PROT 8.0 7.2  ALBUMIN 2.8* 2.5*   No results for input(s): LIPASE, AMYLASE in the last 168 hours. No results for input(s): AMMONIA in the last 168 hours. Coagulation profile Recent Labs  Lab 11/29/20 1329  INR 1.1    CBC: Recent Labs  Lab 11/29/20 1329 11/30/20 0251  WBC 9.7 8.5  NEUTROABS 6.1  --   HGB 14.1 13.0  HCT 44.5 41.3  MCV 89.5 90.4  PLT 142* 208   Cardiac Enzymes: No results for input(s): CKTOTAL, CKMB, CKMBINDEX, TROPONINI in the last 168 hours. BNP (last 3 results) No results for input(s): PROBNP in the last 8760 hours. CBG: Recent Labs  Lab 11/29/20 2126 11/30/20 0617 11/30/20 1112  GLUCAP 176* 105* 116*   D-Dimer: No results for input(s): DDIMER in the last 72 hours. Hgb A1c: No results for input(s): HGBA1C in the last 72 hours. Lipid Profile: No results for input(s): CHOL, HDL, LDLCALC, TRIG, CHOLHDL, LDLDIRECT in the last 72 hours. Thyroid function studies: No results for input(s): TSH, T4TOTAL, T3FREE, THYROIDAB in the last 72 hours.  Invalid input(s): FREET3 Anemia work up: No results for input(s): VITAMINB12, FOLATE, FERRITIN, TIBC, IRON, RETICCTPCT in the last 72 hours. Sepsis Labs: Recent Labs  Lab 11/29/20 1329 11/29/20 2104 11/30/20 0251  WBC 9.7  --  8.5  LATICACIDVEN 2.0* 1.2  --     Microbiology Recent Results (from the past 240 hour(s))  Blood culture (routine single)     Status: None (Preliminary result)   Collection Time: 11/27/20 11:43 AM   Specimen: Blood  Result Value Ref Range Status   MICRO  NUMBER: [id number]  Preliminary   SPECIMEN QUALITY: Adequate  Preliminary   Source BLOOD 1  Preliminary   STATUS: PRELIMINARY  Preliminary   Result:   Preliminary    No growth to date. Culture is continuously monitored for a total of 120 hours incubation. A change in status will result in a phone report followed by an updated printed culture report.   COMMENT: Aerobic and anaerobic bottle received.  Preliminary  Blood culture (routine single)     Status: None (Preliminary result)   Collection Time: 11/27/20 11:46 AM   Specimen: Blood  Result Value Ref Range Status   MICRO NUMBER: [id number]  Preliminary   SPECIMEN QUALITY: Adequate  Preliminary   Source BLOOD 2  Preliminary   STATUS: PRELIMINARY  Preliminary   Result:   Preliminary    No growth to date. Culture is continuously monitored for a total of 120 hours incubation. A change in status will result in a phone report followed by an updated printed culture report.   COMMENT: Aerobic and anaerobic bottle received.  Preliminary  Resp Panel by RT-PCR (Flu A&B, Covid) Nasopharyngeal Swab     Status: None   Collection Time: 11/29/20  1:18 PM   Specimen: Nasopharyngeal Swab; Nasopharyngeal(NP) swabs in vial transport medium  Result Value Ref Range Status   SARS Coronavirus 2 by RT PCR NEGATIVE NEGATIVE Final    Comment: (NOTE) SARS-CoV-2 target nucleic acids are NOT DETECTED.  The SARS-CoV-2 RNA is generally detectable in upper respiratory specimens during the acute phase of infection. The lowest concentration of SARS-CoV-2 viral copies this assay can detect is 138 copies/mL. A negative result does not preclude SARS-Cov-2 infection and should not be used as the sole basis for treatment or other patient management decisions. A negative result may occur with  improper specimen collection/handling, submission of specimen other than nasopharyngeal swab, presence of viral mutation(s) within the areas targeted by this assay, and inadequate  number of viral copies(<138 copies/mL). A negative result must be combined with clinical observations, patient history, and epidemiological information. The expected result is Negative.  Fact Sheet for Patients:  EntrepreneurPulse.com.au  Fact Sheet for Healthcare Providers:  IncredibleEmployment.be  This test is no t yet approved or cleared by the [place] FDA and  has been authorized for detection and/or diagnosis of SARS-CoV-2 by FDA under an Emergency Use Authorization (EUA). This EUA will remain  in effect (meaning this test can be used) for the duration of the COVID-19 declaration under Section 564(b)(1) of the Act, 21 U.S.C.section 360bbb-3(b)(1), unless the authorization is terminated  or revoked sooner.       Influenza A by PCR NEGATIVE NEGATIVE Final   Influenza B by PCR NEGATIVE NEGATIVE Final    Comment: (NOTE) The Xpert Xpress SARS-CoV-2/FLU/RSV plus assay is intended as an aid in the diagnosis of influenza from Nasopharyngeal swab specimens and should not be used as a sole basis for treatment. Nasal washings and aspirates are unacceptable for Xpert Xpress SARS-CoV-2/FLU/RSV testing.  Fact Sheet for Patients: EntrepreneurPulse.com.au  Fact Sheet for Healthcare Providers: IncredibleEmployment.be  This test is not yet approved or cleared by the [place] FDA and has been authorized for detection and/or diagnosis of SARS-CoV-2 by FDA under an Emergency Use Authorization (EUA). This EUA will remain in effect (meaning this test can be used) for the duration of the COVID-19 declaration under Section 564(b)(1) of the Act, 21 U.S.C. section 360bbb-3(b)(1), unless the authorization is terminated or revoked.  Performed at [facility], [place] [address], [place], [place] [address]   Blood Culture (routine x 2)     Status: None (Preliminary result)   Collection Time: 11/29/20  2:20 PM    Specimen: BLOOD RIGHT ARM  Result Value Ref Range Status   Specimen Description BLOOD RIGHT ARM  Final   Special Requests   Final    BOTTLES DRAWN AEROBIC AND ANAEROBIC Blood Culture adequate volume   Culture   Final    NO GROWTH < 24 HOURS Performed at [facility], [place] [address], [place], [place] [address]    Report Status PENDING  Incomplete    Procedures and diagnostic studies:  DG Orthopantogram  Result Date: 11/29/2020 CLINICAL DATA:  Fever and chills EXAM: ORTHOPANTOGRAM/PANORAMIC COMPARISON:  None. FINDINGS: Visualized paranasal sinuses are clear. Dental hardware. No visualized dental caries or periapical lucencies. IMPRESSION: No acute abnormality visualized. Electronically Signed   By: Dahlia Bailiff MD   On: 11/29/2020 20:28   DG Chest 1 View  Result Date: 11/29/2020 CLINICAL DATA:  Shoulder pain.  Fever EXAM: CHEST  1 VIEW COMPARISON:  11/15/2020 FINDINGS: Interval placement of a right-sided PICC line with distal tip terminating at the level of the distal SVC. The heart size and mediastinal contours are within normal limits. Low lung volumes with mild bibasilar atelectasis. No pleural effusion or pneumothorax. The visualized skeletal structures are unremarkable. IMPRESSION: Low lung volumes with bibasilar atelectasis. Electronically Signed   By: Davina Poke D.O.   On: 11/29/2020 13:56   MR SHOULDER LEFT W WO CONTRAST  Result Date: 11/29/2020 CLINICAL DATA:  Shoulder pain.  History of septic arthritis EXAM: MRI OF THE LEFT SHOULDER WITHOUT AND WITH CONTRAST TECHNIQUE: Multiplanar, multisequence MR imaging of the left shoulder was performed before and after the administration of intravenous contrast. CONTRAST:  83mL GADAVIST GADOBUTROL 1 MMOL/ML IV SOLN COMPARISON:  MRI 11/16/2020 FINDINGS: Rotator cuff: Rotator cuff is grossly intact. Severe supraspinatus and moderate infraspinatus tendinosis. Low-grade interstitial tearing of the distal supraspinatus tendon. No  full-thickness rotator cuff tear. Muscles: Patchy intramuscular edema throughout the rotator cuff musculature as well as the deltoid musculature. Biceps long head: Nonvisualization of the intra-articular portion of the long head biceps tendon which may reflect tear or prior tenodesis/tenotomy. Acromioclavicular Joint: Mild arthropathy of the AC joint. Moderate sized complex subacromial-subdeltoid bursal fluid collection with thick peripheral enhancement (series 10, image 16). Glenohumeral Joint: Moderate-sized complex glenohumeral joint effusion with thickened, enhancing synovium. No cartilage defect or erosion. Labrum:  Persistent superior labral tear. Bones: Interval development of bone marrow edema within the greater tuberosity underlying the infraspinatus tendon insertion site. Subcortical cyst or erosion at the greater tuberosity posteriorly (series 6, image 6). No fracture or dislocation. Other: Diffuse soft tissue swelling about the shoulder. IMPRESSION: 1. Findings compatible with septic arthritis of the left glenohumeral joint and subacromial/subdeltoid septic bursitis. 2. Interval development of bone marrow edema at the posterior aspect of the greater tuberosity underlying the infraspinatus tendon insertion site with possible small erosion. Findings are concerning for developing osteomyelitis. 3. Patchy intramuscular edema throughout the rotator cuff musculature as well as the deltoid musculature suggesting myositis. Electronically Signed   By: Davina Poke D.O.   On: 11/29/2020 19:22    Medications:   . alteplase  2 mg Intracatheter Once  . aspirin EC  81 mg Oral q morning  . Chlorhexidine Gluconate Cloth  6 each Topical Daily  . empagliflozin  25 mg Oral q morning  . enoxaparin (LOVENOX) injection  40 mg Subcutaneous Q24H  . gabapentin  300 mg Oral QHS  . insulin aspart  0-15 Units Subcutaneous TID WC  . metFORMIN  1,000 mg Oral BID WC  . senna-docusate  1 tablet Oral BID   Continuous  Infusions:   LOS: 1 day   Geradine Girt  [facility]   How to contact the [facility] Attending or Consulting provider [place] or covering provider during after hours [place], for this patient?  1. Check the care team in [facility] and look for a) attending/consulting [facility] provider listed and b) the [facility] team listed 2. Log into [email or web address] and use 's universal password to access. If you do not have the password, please contact the hospital operator. 3. Locate the  [place] provider you are looking for under [facility] and page to a number that you can be directly reached. 4. If you still have difficulty reaching the provider, please page the [facility] (Director on Call) for the Hospitalists listed on amion for assistance.  11/30/2020, 1:44 PM

## 2020-11-30 NOTE — Progress Notes (Signed)
Logan [name]  MRN: [id number] DOB/Age: January 23, 1971 50 y.o. Physician: [name], Logan.D.     Subjective: Called to see patient with recent admission for presumptive left shoulder septic arthritis.  Patient reported insidious onset of left shoulder pain in early April.  Subsequent admission for/8/22 for increased shoulder pain concerning for possible septic arthritis.  Taken to surgery 11/18/2020 by Dr. Victorino [name] for arthroscopic lavage and debridement.  I spoke with Dr. Stann [name] this afternoon who reported that the operative findings were unremarkable for septic arthritis with no obvious purulence, and findings predominantly suggestive of chronic degenerative changes.  Patient does report sisters who have been diagnosed with some type of "arthritis" for which they are on medications of which he does not know the details.  Reports that also his mother has had difficulties with shoulder pain for which again he does not have any specific details. Vital Signs Temp:  [98.7 F (37.1 C)-99.3 F (37.4 C)] 98.8 F (37.1 C) (04/23 1228) Pulse Rate:  [83-126] 102 (04/23 1228) Resp:  [15-22] 16 (04/23 1228) BP: (118-158)/(85-97) 128/92 (04/23 1228) SpO2:  [91 %-99 %] 98 % (04/23 1228) Weight:  [75 kg] 75 kg (04/22 1900)  [name] Results Recent Labs    11/29/20 1329 11/30/20 0251  WBC 9.7 8.5  HGB 14.1 13.0  HCT 44.5 41.3  PLT 142* 208   BMET Recent Labs    11/29/20 1329 11/30/20 0251  NA 133* 134*  K 4.2 4.0  CL 99 102  CO2 22 24  GLUCOSE 188* 101*  BUN 15 16  CREATININE 0.85 0.68  CALCIUM 8.9 8.7*   INR  Date Value Ref Range Status  11/29/2020 1.1 0.8 - 1.2 Final    Comment:    (NOTE) INR goal varies based on device and disease states. Performed at [name], [name] [address], [name], [place] [address]      Exam  Patient is alert and oriented on today's exam.  While sitting at the bedside inspection of the left shoulder demonstrates that his previous arthroscopy  portals are healing nicely.  There is no generalized erythema, induration, or swelling about the shoulder.  No evidence for distal cellulitis or lymphangitis.  He is neurovascular intact distally in left upper extremity.  He does report pain with passive shoulder motion although there is no crepitance and minimal guarding.  There is no palpable increased temperature of left shoulder when compared to the right.  Report of the recent MRI scan was interpreted as showing evidence potentially suggestive of septic arthritis and potentially osteomyelitis.  Alternatively these changes could potentially represent postoperative change from recent arthroscopic surgery.  Certainly recent vital signs demonstrate the patient to be afebrile with normal white count which would be atypical for a fulminant septic arthritis.  Impression:  Ongoing left shoulder pain with presumptive septic arthritis but no culture documentation.  This may potentially represent degenerative arthritis versus gouty arthropathy as alternative diagnoses.  Plan I have counseled [name] regarding today's clinical findings as well as the results of his recent laboratory studies.  I have also spoken with Dr. Eliseo [name] regarding today's exam.  At this time I do not identify any conditions that would necessitate an urgent trip to the operating room for washout.  I would recommend obtaining CRP and sed rate as well as arranging for interventional radiology to perform an aspiration of the [name] joint and potentially as well the [name] space.  Otherwise continue supportive care and Dr. Stann [name] will follow  up on Monday for review of current clinical status as well as the results of the ongoing work-up.   Logan [name] 11/30/2020, 3:03 PM   Contact # [phone]

## 2020-12-01 DIAGNOSIS — M25512 Pain in left shoulder: Secondary | ICD-10-CM | POA: Diagnosis not present

## 2020-12-01 DIAGNOSIS — E1369 Other specified diabetes mellitus with other specified complication: Secondary | ICD-10-CM

## 2020-12-01 LAB — GLUCOSE, CAPILLARY
Glucose-Capillary: 133 mg/dL — ABNORMAL HIGH (ref 70–99)
Glucose-Capillary: 133 mg/dL — ABNORMAL HIGH (ref 70–99)
Glucose-Capillary: 146 mg/dL — ABNORMAL HIGH (ref 70–99)
Glucose-Capillary: 171 mg/dL — ABNORMAL HIGH (ref 70–99)

## 2020-12-01 LAB — ANTISTREPTOLYSIN O TITER: ASO: 50 IU/mL (ref 0.0–200.0)

## 2020-12-01 MED ORDER — ADULT MULTIVITAMIN W/MINERALS CH
1.0000 | ORAL_TABLET | Freq: Every morning | ORAL | Status: DC
  Administered 2020-12-01 – 2020-12-03 (×3): 1 via ORAL
  Filled 2020-12-01 (×3): qty 1

## 2020-12-01 MED ORDER — VITAMIN B-12 1000 MCG PO TABS
1000.0000 ug | ORAL_TABLET | Freq: Every day | ORAL | Status: DC
  Administered 2020-12-01 – 2020-12-03 (×3): 1000 ug via ORAL
  Filled 2020-12-01 (×3): qty 1

## 2020-12-01 NOTE — Progress Notes (Signed)
Brief infectious disease update note:  Chart reviewed this morning, patient remains afebrile.  Appreciate orthopedic surgery evaluation yesterday.  Currently no indication for urgent trip back to the OR for washout.  Pending IR aspiration of joint for hopeful culture diagnosis.  No labs this morning, however, CRP improved from 2 weeks ago and ESR remains similar to prior.  Recommend continuing to hold antibiotics pending IR joint aspiration in the hopes of increasing culture yield and then starting vancomycin and ceftriaxone empirically.  If clinically were to decompensate would add these antibiotics back sooner.  Dr Gale Journey or [place] will be back tomorrow.   [name] for [facility] 12/01/2020, 7:48 AM

## 2020-12-01 NOTE — Progress Notes (Signed)
Progress Note    Logan [name]  WNI:[id number] DOB: 1970-10-02  DOA: 11/29/2020 PCP: Rudene Anda, MD    Brief Narrative:     Medical records reviewed and are as summarized below:  Logan [name] is an 50 y.o. male with medical history significant of IDDM, HTN, HLD, recent diagnosed left shoulder septic arthritis, presented with worsening of left shoulder pain and fever.Patient was admitted on April 9th for left shoulder septic arthritis.  Arthroscopic incision and drainage and debridement done on 04/11.  According to OR record, culture was not able to obtain.  Sent home based IV vancomycin via right arm PICC line.  And patient been following with orthopedic surgery as well as infectious disease.  Patient reported that the left shoulder pain never went away, and on the contrary has been gradually getting worse.  Assessment/Plan:   Active Problems:   Septic arthritis of shoulder, left (HCC)   Septic arthritis (HCC)   Left shoulder septic arthritis vs another inflammatory condition -Failed outpatient IV antibiotic treatment -ID consulted in ED, who recommend discontinue Levaquin and continue Vancomycin. - IR joint aspiration with synovial fluid studies-- informed by IR RN that this aspiration can not be done until Monday- culture/uric acid etc -MRI shoulder:  1. Findings compatible with septic arthritis of the left glenohumeral joint and subacromial/subdeltoid septic bursitis. 2. Interval development of bone marrow edema at the posterior aspect of the greater tuberosity underlying the infraspinatus tendon insertion site with possible small erosion. Findings are concerning for developing osteomyelitis. 3. Patchy intramuscular edema throughout the rotator cuff musculature as well as the deltoid musculature suggesting myositis.  Sinus tachycardia -With mild elevation of lactate level, suspect early sepsis, will give 1 bolus of IV fluids -add IV Pain meds-- requesting  dilaudid  Mild transaminitis -New compared to the level on last admission. No RUQ pain. Will hold Statin for now.  IDDM with hyperglycemia -Recent A1C 8.3% -Continue Jardiance -Add sliding scale.  HTN -resume home meds  HLD -Hold Statin as above.   Family Communication/Anticipated D/C date and plan/Code Status   DVT prophylaxis: Lovenox ordered. Code Status: Full Code.  Disposition Plan: Status is: Inpatient  Remains inpatient appropriate because:Inpatient level of care appropriate due to severity of illness   Dispo: The patient is from: Home              Anticipated d/c is to: Home              Patient currently is not medically stable to d/c.   Difficult to place patient No         Medical Consultants:    ID  ortho   Anti-Infectives:    Holding pending surgery  Subjective:  Multiple questions  Objective:    Vitals:   12/01/20 0514 12/01/20 0815 12/01/20 0937 12/01/20 1208  BP: 119/82 123/84 (!) 126/97 118/88  Pulse: 98 92  (!) 110  Resp: 18 18  18   Temp: 98.6 F (37 C) 98.4 F (36.9 C)  97.6 F (36.4 C)  TempSrc: Oral Oral  Oral  SpO2: 94% 96%  97%  Weight:      Height:        Intake/Output Summary (Last 24 hours) at 12/01/2020 1236 Last data filed at 11/30/2020 1800 Gross per 24 hour  Intake 360 ml  Output --  Net 360 ml   Filed Weights   11/29/20 1306 11/29/20 1900  Weight: 72.1 kg 75 kg    Exam:  General: Appearance:     Overweight male in no acute distress     Lungs:     respirations unlabored  Heart:    Tachycardic. Normal rhythm. No murmurs, rubs, or gallops.   MS:   All extremities are intact.   Neurologic:   Awake, alert, oriented x 3.      Data Reviewed:   I have personally reviewed following labs and imaging studies:  Labs: Labs show the following:   Basic Metabolic Panel: Recent Labs  Lab 11/29/20 1329 11/30/20 0251  NA 133* 134*  K 4.2 4.0  CL 99 102  CO2 22 24  GLUCOSE 188* 101*  BUN 15  16  CREATININE 0.85 0.68  CALCIUM 8.9 8.7*   GFR Estimated Creatinine Clearance: 100.1 mL/min (by C-G formula based on SCr of 0.68 mg/dL). Liver Function Tests: Recent Labs  Lab 11/29/20 1329 11/30/20 0251  AST 52* 44*  ALT 63* 61*  ALKPHOS 221* 194*  BILITOT 0.3 0.6  PROT 8.0 7.2  ALBUMIN 2.8* 2.5*   No results for input(s): LIPASE, AMYLASE in the last 168 hours. No results for input(s): AMMONIA in the last 168 hours. Coagulation profile Recent Labs  Lab 11/29/20 1329  INR 1.1    CBC: Recent Labs  Lab 11/29/20 1329 11/30/20 0251  WBC 9.7 8.5  NEUTROABS 6.1  --   HGB 14.1 13.0  HCT 44.5 41.3  MCV 89.5 90.4  PLT 142* 208   Cardiac Enzymes: No results for input(s): CKTOTAL, CKMB, CKMBINDEX, TROPONINI in the last 168 hours. BNP (last 3 results) No results for input(s): PROBNP in the last 8760 hours. CBG: Recent Labs  Lab 11/30/20 1112 11/30/20 1615 11/30/20 2201 12/01/20 0619 12/01/20 1126  GLUCAP 116* 122* 136* 133* 133*   D-Dimer: No results for input(s): DDIMER in the last 72 hours. Hgb A1c: No results for input(s): HGBA1C in the last 72 hours. Lipid Profile: No results for input(s): CHOL, HDL, LDLCALC, TRIG, CHOLHDL, LDLDIRECT in the last 72 hours. Thyroid function studies: No results for input(s): TSH, T4TOTAL, T3FREE, THYROIDAB in the last 72 hours.  Invalid input(s): FREET3 Anemia work up: No results for input(s): VITAMINB12, FOLATE, FERRITIN, TIBC, IRON, RETICCTPCT in the last 72 hours. Sepsis Labs: Recent Labs  Lab 11/29/20 1329 11/29/20 2104 11/30/20 0251  WBC 9.7  --  8.5  LATICACIDVEN 2.0* 1.2  --     Microbiology Recent Results (from the past 240 hour(s))  Blood culture (routine single)     Status: None (Preliminary result)   Collection Time: 11/27/20 11:43 AM   Specimen: Blood  Result Value Ref Range Status   MICRO NUMBER: [id number]  Preliminary   SPECIMEN QUALITY: Adequate  Preliminary   Source BLOOD 1  Preliminary    STATUS: PRELIMINARY  Preliminary   Result:   Preliminary    No growth to date. Culture is continuously monitored for a total of 120 hours incubation. A change in status will result in a phone report followed by an updated printed culture report.   COMMENT: Aerobic and anaerobic bottle received.  Preliminary  Blood culture (routine single)     Status: None (Preliminary result)   Collection Time: 11/27/20 11:46 AM   Specimen: Blood  Result Value Ref Range Status   MICRO NUMBER: [id number]  Preliminary   SPECIMEN QUALITY: Adequate  Preliminary   Source BLOOD 2  Preliminary   STATUS: PRELIMINARY  Preliminary   Result:   Preliminary    No growth to date. Culture is  continuously monitored for a total of 120 hours incubation. A change in status will result in a phone report followed by an updated printed culture report.   COMMENT: Aerobic and anaerobic bottle received.  Preliminary  Urine culture     Status: None   Collection Time: 11/29/20  1:18 PM   Specimen: In/Out Cath Urine  Result Value Ref Range Status   Specimen Description IN/OUT CATH URINE  Final   Special Requests NONE  Final   Culture   Final    NO GROWTH Performed at [facility], [place] [address], [place], [place] [address]    Report Status 11/30/2020 FINAL  Final  Resp Panel by RT-PCR (Flu A&B, Covid) Nasopharyngeal Swab     Status: None   Collection Time: 11/29/20  1:18 PM   Specimen: Nasopharyngeal Swab; Nasopharyngeal(NP) swabs in vial transport medium  Result Value Ref Range Status   SARS Coronavirus 2 by RT PCR NEGATIVE NEGATIVE Final    Comment: (NOTE) SARS-CoV-2 target nucleic acids are NOT DETECTED.  The SARS-CoV-2 RNA is generally detectable in upper respiratory specimens during the acute phase of infection. The lowest concentration of SARS-CoV-2 viral copies this assay can detect is 138 copies/mL. A negative result does not preclude SARS-Cov-2 infection and should not be used as the sole basis for treatment  or other patient management decisions. A negative result may occur with  improper specimen collection/handling, submission of specimen other than nasopharyngeal swab, presence of viral mutation(s) within the areas targeted by this assay, and inadequate number of viral copies(<138 copies/mL). A negative result must be combined with clinical observations, patient history, and epidemiological information. The expected result is Negative.  Fact Sheet for Patients:  EntrepreneurPulse.com.au  Fact Sheet for Healthcare Providers:  IncredibleEmployment.be  This test is no t yet approved or cleared by the [place] FDA and  has been authorized for detection and/or diagnosis of SARS-CoV-2 by FDA under an Emergency Use Authorization (EUA). This EUA will remain  in effect (meaning this test can be used) for the duration of the COVID-19 declaration under Section 564(b)(1) of the Act, 21 U.S.C.section 360bbb-3(b)(1), unless the authorization is terminated  or revoked sooner.       Influenza A by PCR NEGATIVE NEGATIVE Final   Influenza B by PCR NEGATIVE NEGATIVE Final    Comment: (NOTE) The Xpert Xpress SARS-CoV-2/FLU/RSV plus assay is intended as an aid in the diagnosis of influenza from Nasopharyngeal swab specimens and should not be used as a sole basis for treatment. Nasal washings and aspirates are unacceptable for Xpert Xpress SARS-CoV-2/FLU/RSV testing.  Fact Sheet for Patients: EntrepreneurPulse.com.au  Fact Sheet for Healthcare Providers: IncredibleEmployment.be  This test is not yet approved or cleared by the [place] FDA and has been authorized for detection and/or diagnosis of SARS-CoV-2 by FDA under an Emergency Use Authorization (EUA). This EUA will remain in effect (meaning this test can be used) for the duration of the COVID-19 declaration under Section 564(b)(1) of the Act, 21 U.S.C. section  360bbb-3(b)(1), unless the authorization is terminated or revoked.  Performed at [facility], [place] [address] [place], [place] [address]   Blood Culture (routine x 2)     Status: None (Preliminary result)   Collection Time: 11/29/20  2:20 PM   Specimen: BLOOD RIGHT ARM  Result Value Ref Range Status   Specimen Description BLOOD RIGHT ARM  Final   Special Requests   Final    BOTTLES DRAWN AEROBIC AND ANAEROBIC Blood  Culture adequate volume   Culture   Final    NO GROWTH 2 DAYS Performed at [facility], [place] [address] [place], [place] [address]    Report Status PENDING  Incomplete  Blood Culture (routine x 2)     Status: None (Preliminary result)   Collection Time: 11/29/20  9:05 PM   Specimen: BLOOD LEFT HAND  Result Value Ref Range Status   Specimen Description BLOOD LEFT HAND  Final   Special Requests   Final    BOTTLES DRAWN AEROBIC AND ANAEROBIC Blood Culture adequate volume   Culture   Final    NO GROWTH 1 DAY Performed at [facility], [place] [address] [place], [place] [address]    Report Status PENDING  Incomplete    Procedures and diagnostic studies:  DG Orthopantogram  Result Date: 11/29/2020 CLINICAL DATA:  Fever and chills EXAM: ORTHOPANTOGRAM/PANORAMIC COMPARISON:  None. FINDINGS: Visualized paranasal sinuses are clear. Dental hardware. No visualized dental caries or periapical lucencies. IMPRESSION: No acute abnormality visualized. Electronically Signed   By: Dahlia Bailiff MD   On: 11/29/2020 20:28   DG Chest 1 View  Result Date: 11/29/2020 CLINICAL DATA:  Shoulder pain.  Fever EXAM: CHEST  1 VIEW COMPARISON:  11/15/2020 FINDINGS: Interval placement of a right-sided PICC line with distal tip terminating at the level of the distal SVC. The heart size and mediastinal contours are within normal limits. Low lung volumes with mild bibasilar atelectasis. No pleural effusion or pneumothorax. The visualized skeletal structures are unremarkable.  IMPRESSION: Low lung volumes with bibasilar atelectasis. Electronically Signed   By: Davina Poke D.O.   On: 11/29/2020 13:56   MR SHOULDER LEFT W WO CONTRAST  Result Date: 11/29/2020 CLINICAL DATA:  Shoulder pain.  History of septic arthritis EXAM: MRI OF THE LEFT SHOULDER WITHOUT AND WITH CONTRAST TECHNIQUE: Multiplanar, multisequence MR imaging of the left shoulder was performed before and after the administration of intravenous contrast. CONTRAST:  78mL GADAVIST GADOBUTROL 1 MMOL/ML IV SOLN COMPARISON:  MRI 11/16/2020 FINDINGS: Rotator cuff: Rotator cuff is grossly intact. Severe supraspinatus and moderate infraspinatus tendinosis. Low-grade interstitial tearing of the distal supraspinatus tendon. No full-thickness rotator cuff tear. Muscles: Patchy intramuscular edema throughout the rotator cuff musculature as well as the deltoid musculature. Biceps long head: Nonvisualization of the intra-articular portion of the long head biceps tendon which may reflect tear or prior tenodesis/tenotomy. Acromioclavicular Joint: Mild arthropathy of the AC joint. Moderate sized complex subacromial-subdeltoid bursal fluid collection with thick peripheral enhancement (series 10, image 16). Glenohumeral Joint: Moderate-sized complex glenohumeral joint effusion with thickened, enhancing synovium. No cartilage defect or erosion. Labrum:  Persistent superior labral tear. Bones: Interval development of bone marrow edema within the greater tuberosity underlying the infraspinatus tendon insertion site. Subcortical cyst or erosion at the greater tuberosity posteriorly (series 6, image 6). No fracture or dislocation. Other: Diffuse soft tissue swelling about the shoulder. IMPRESSION: 1. Findings compatible with septic arthritis of the left glenohumeral joint and subacromial/subdeltoid septic bursitis. 2. Interval development of bone marrow edema at the posterior aspect of the greater tuberosity underlying the infraspinatus tendon  insertion site with possible small erosion. Findings are concerning for developing osteomyelitis. 3. Patchy intramuscular edema throughout the rotator cuff musculature as well as the deltoid musculature suggesting myositis. Electronically Signed   By: Davina Poke D.O.   On: 11/29/2020 19:22   VAS [place] UPPER EXTREMITY VENOUS DUPLEX  Result Date: 12/01/2020 UPPER VENOUS STUDY  Patient Name:  TOR TSUDA  Laye  Date of Exam:   11/30/2020 Medical Rec #: [id number]        Accession #:    [id number] Date of Birth: 1971/04/24        Patient Gender: M Patient Age:   50Y Exam Location:  [facility] Procedure:      VAS [place] UPPER EXTREMITY VENOUS DUPLEX Referring Phys: [id number] CORNELIUS N VAN DAM --------------------------------------------------------------------------------  Indications: fever, septic shoulder Comparison Study: No prior study Performing Technologist: Sharion Dove RVS  Examination Guidelines: A complete evaluation includes B-mode imaging, spectral Doppler, color Doppler, and power Doppler as needed of all accessible portions of each vessel. Bilateral testing is considered an integral part of a complete examination. Limited examinations for reoccurring indications may be performed as noted.  Right Findings: +----------+------------+---------+-----------+----------+---------------------+ RIGHT     CompressiblePhasicitySpontaneousProperties       Summary        +----------+------------+---------+-----------+----------+---------------------+ IJV           Full       Yes       Yes                                    +----------+------------+---------+-----------+----------+---------------------+ Subclavian               Yes       Yes                                    +----------+------------+---------+-----------+----------+---------------------+ Axillary                 Yes       Yes                                     +----------+------------+---------+-----------+----------+---------------------+ Brachial      Full                                                        +----------+------------+---------+-----------+----------+---------------------+ Cephalic      Full                                                        +----------+------------+---------+-----------+----------+---------------------+ Basilic       None                                  age indeterminate mid                                                            forearm        +----------+------------+---------+-----------+----------+---------------------+  Left Findings: +----------+------------+---------+-----------+----------+-------+ LEFT      CompressiblePhasicitySpontaneousPropertiesSummary +----------+------------+---------+-----------+----------+-------+ Subclavian  Yes       Yes                      +----------+------------+---------+-----------+----------+-------+  Summary:  Right: No evidence of deep vein thrombosis in the upper extremity. Findings consistent with age indeterminate superficial vein thrombosis involving a small segment in the forearm of the right basilic vein.  Left: No evidence of thrombosis in the subclavian.  *See table(s) above for measurements and observations.  Diagnosing physician: Jamelle Haring Electronically signed by Jamelle Haring on 12/01/2020 at 8:52:57 AM.    Final     Medications:   . alteplase  2 mg Intracatheter Once  . aspirin EC  81 mg Oral q morning  . Chlorhexidine Gluconate Cloth  6 each Topical Daily  . empagliflozin  25 mg Oral q morning  . enoxaparin (LOVENOX) injection  40 mg Subcutaneous Q24H  . gabapentin  300 mg Oral QHS  . insulin aspart  0-15 Units Subcutaneous TID WC  . lisinopril  10 mg Oral Daily  . multivitamin with minerals  1 tablet Oral q morning  . senna-docusate  1 tablet Oral BID  . vitamin B-12  1,000 mcg Oral Daily    Continuous Infusions:   LOS: 2 days   Geradine Girt  [facility]   How to contact the [facility] Attending or Consulting provider [place] or covering provider during after hours [place], for this patient?  1. Check the care team in [facility] and look for a) attending/consulting [facility] provider listed and b) the [facility] team listed 2. Log into [email or web address] and use [facility]'s universal password to access. If you do not have the password, please contact the hospital operator. 3. Locate the [facility] provider you are looking for under [facility] and page to a number that you can be directly reached. 4. If you still have difficulty reaching the provider, please page the [facility] (Director on Call) for the Hospitalists listed on amion for assistance.  12/01/2020, 12:36 PM

## 2020-12-02 ENCOUNTER — Inpatient Hospital Stay (HOSPITAL_COMMUNITY): Payer: Commercial Managed Care - PPO

## 2020-12-02 ENCOUNTER — Telehealth: Payer: Self-pay

## 2020-12-02 DIAGNOSIS — M009 Pyogenic arthritis, unspecified: Secondary | ICD-10-CM | POA: Diagnosis not present

## 2020-12-02 DIAGNOSIS — M25519 Pain in unspecified shoulder: Secondary | ICD-10-CM | POA: Diagnosis not present

## 2020-12-02 DIAGNOSIS — E119 Type 2 diabetes mellitus without complications: Secondary | ICD-10-CM

## 2020-12-02 LAB — SYNOVIAL CELL COUNT + DIFF, W/ CRYSTALS
Crystals, Fluid: NONE SEEN
Eosinophils-Synovial: 1 % (ref 0–1)
Lymphocytes-Synovial Fld: 23 % — ABNORMAL HIGH (ref 0–20)
Monocyte-Macrophage-Synovial Fluid: 10 % — ABNORMAL LOW (ref 50–90)
Neutrophil, Synovial: 66 % — ABNORMAL HIGH (ref 0–25)
WBC, Synovial: 18 /mm3 (ref 0–200)

## 2020-12-02 LAB — GLUCOSE, CAPILLARY
Glucose-Capillary: 121 mg/dL — ABNORMAL HIGH (ref 70–99)
Glucose-Capillary: 163 mg/dL — ABNORMAL HIGH (ref 70–99)
Glucose-Capillary: 161 mg/dL — ABNORMAL HIGH (ref 70–99)
Glucose-Capillary: 122 mg/dL — ABNORMAL HIGH (ref 70–99)

## 2020-12-02 MED ORDER — LIDOCAINE HCL (PF) 1 % IJ SOLN
5.0000 mL | Freq: Once | INTRAMUSCULAR | Status: AC
  Administered 2020-12-02: 5 mL via INTRADERMAL

## 2020-12-02 MED ORDER — DOXYCYCLINE HYCLATE 100 MG PO TABS
100.0000 mg | ORAL_TABLET | Freq: Two times a day (BID) | ORAL | Status: DC
  Administered 2020-12-02 – 2020-12-03 (×2): 100 mg via ORAL
  Filled 2020-12-02 (×2): qty 1

## 2020-12-02 MED ORDER — SODIUM CHLORIDE 0.9 % IV SOLN
2.0000 g | INTRAVENOUS | Status: DC
  Administered 2020-12-02: 2 g via INTRAVENOUS
  Filled 2020-12-02: qty 2
  Filled 2020-12-02: qty 20

## 2020-12-02 MED ORDER — DOXYCYCLINE HYCLATE 100 MG PO TABS: 100.0000 mg | ORAL_TABLET | Freq: Two times a day (BID) | ORAL | Status: DC

## 2020-12-02 MED ORDER — DICLOFENAC SODIUM 1 % EX GEL
2.0000 g | Freq: Four times a day (QID) | CUTANEOUS | Status: DC
  Administered 2020-12-02 – 2020-12-03 (×5): 2 g via TOPICAL
  Filled 2020-12-02: qty 100

## 2020-12-02 NOTE — Progress Notes (Signed)
Subjective:  Logan [name] is a 50 year old male with a PMH ofT2DM, HTN, HLD, and left shoulder septic arthritis s/p I&D by Dr. Stann Mainland on 11/18/20. He was discharged with a PICC line for outpatient vancomycin treatment but returned to the hospital on 11/29/20 due to continued shoulder pain and fevers. He was admitted and was recommended patient have a a left shoulder aspiration by interventional radiology.  It was noted that was scant bloody fluid aspirated and sent to the lab for culture and crystals evaluation.  Patient reports pain as mild to moderate.  He reports continued pain of the left shoulder.  Reports that he is unable to actively significantly forward flex or externally rotate his left shoulder secondary to pain.  He denies numbness or tingling.  Denies fever or chills.  Does report a history of carpal tunnel.  He reports he has not had any fevers since he has been in the hospital.  Objective:   VITALS:   Vitals:   12/01/20 1208 12/01/20 1720 12/02/20 0012 12/02/20 0433  BP: 118/88 127/90 109/83 111/84  Pulse: (!) 110 94 (!) 103 99  Resp: 18 18 18 18   Temp: 97.6 F (36.4 C) 97.6 F (36.4 C) 98.8 F (37.1 C) 98.8 F (37.1 C)  TempSrc: Oral Oral Oral Oral  SpO2: 97% 94% 97% 94%  Weight:      Height:        Constitutional: General Appearance: healthy-appearing, well-nourished, and well-developed. Level of Distress: no acute distress. Eyes: Lens (normal) clear: both eyes. Head: Head: normocephalic and atraumatic. Lungs: Respiratory effort: no dyspnea. Skin: Inspection and palpation: no rash, lesions Neurologic: Cranial Nerves: grossly intact. Sensation: grossly intact. Psychiatric: Insight: good judgement and insight. Mental Status: normal mood and affect and active and alert. Orientation: to time, place, and person.  Left Upper Extremity:  Healed arthroscopic incisions of the left shoulder without signs of infection.  No erythema, induration, or drainage of the left  shoulder.  Active forward flexion to 20, active external rotation to 5.  Passive forward flexion to 90, passive external rotation to 10.  Full elbow, wrist, and digit range of motion.   Neurovascular: SILT. Brisk capillary refill. Radial Pulse 2+   SENSORY: sensation is intact to light touch in:  superficial radial nerve distribution (dorsal first web space) median nerve distribution (tip of index finger)   ulnar nerve distribution (tip of small finger)     MOTOR:  + motor posterior interosseous nerve (thumb IP extension) + anterior interosseous nerve (thumb IP flexion, index finger DIP flexion) + radial nerve (wrist extension) + median nerve (palpable firing thenar mass) +ulnar nerve (palpable firing of the first dorsal interosseous muscle)     Lab Results  Component Value Date   WBC 8.5 11/30/2020   HGB 13.0 11/30/2020   HCT 41.3 11/30/2020   MCV 90.4 11/30/2020   PLT 208 11/30/2020   BMET    Component Value Date/Time   NA 134 (L) 11/30/2020 0251   NA 139 02/23/2019 1054   K 4.0 11/30/2020 0251   CL 102 11/30/2020 0251   CO2 24 11/30/2020 0251   GLUCOSE 101 (H) 11/30/2020 0251   BUN 16 11/30/2020 0251   BUN 13 02/23/2019 1054   CREATININE 0.68 11/30/2020 0251   CREATININE 0.86 06/23/2016 1424   CALCIUM 8.7 (L) 11/30/2020 0251   GFRNONAA >60 11/30/2020 0251   GFRNONAA >89 06/23/2016 1424   GFRAA 113 02/23/2019 1054   GFRAA >89 06/23/2016 1424  Assessment/Plan:     Active Problems:   Septic arthritis of shoulder, left (HCC)   Septic arthritis (HCC)   Advance diet We will follow his culture, cell count, and crystal evaluation from his aspiration to determine if a repeat I&D is necessary. At this time would not recommended I&D.   Weightbearing Status: ROM, WBAT as tolerated. PT order placed to prevent adhesive capsulitis.    Message with questions or concerns.   Faythe Casa 12/02/2020, 11:24 AM  Jonelle Sidle PA-C  Physician Assistant with  Dr. Lillia Abed [place]

## 2020-12-02 NOTE — Progress Notes (Addendum)
Logan [name] for Infectious Disease  Date of Admission:  11/29/2020     CC: Fever Left shoulder septic arthritis  Lines: 4/12 - c rue picc   Abx: 4/25-c doxy/ceftriaxone   4/20-4/22 levoflox 4/15-4/22 vanc    ASSESSMENT: Acute monoarthritis with periarticular myositis/soft tissue inflammation left shoulder; onset 4/08 Psoriasis previously on DMARD (otezla as of 06/2020) Hx pcn alelrgy (rash) distant past (20-30 years pta); tolerated cefepime 4/08  Patient initially had arthroscopic I&D left shoulder and rotator cuff after 3 days vanc/cefepime; cx not sent. Initial admission 4/08 bcx before abx negative. This admission 4/22 bcx also negative in setting vanc/levo  He had fever initial admission 4/08 and then after discharge despite levofloxacin addition to vanc (although only for 2 days).  Ortho reevaluated this admission so far deemed no need for I&D. He had undergone IR aspiration 4/25 and fluid analysis only showed 18 wbc. He has improving crp.   Monoarticular arthritis would be ID most likely once crystal arthropathy/lyme/gonoococcal process r/o'ed. Without hardware, I would consider staph/strep as most common cause for it. While otezla is less likely than tnf-a inhibitor to cause atypical infection, I would consider this in him (endemic fungi/mycobacteria). He has no respiratory sx otherwise or recent pna but still would consider  Unfortunately no fungal/afb cx sent from joint aspirate; unlikely of yield as fluid aspirate minimal ("scant bloody material obtained")    PLAN: 1. Start doxy/ceftriaxone (tolerated cefepime) after urine gc/chlam obtained 2. I would send quantiferon gold and urine gc/chlam, and tomorrow would discuss with him lyme exposure before considering working up for that 3. Agree primary team w/u for autoimmune related joint syndrome  4.   F/u joint fluid cx   I spent more than 35 minute reviewing data/chart, and coordinating care and  >50% direct face to face time providing counseling/discussing diagnostics/treatment plan with patient   Active Problems:   Diabetes ([place])   Shoulder pain   Allergies  Allergen Reactions  . Penicillins Hives and Rash    Red rashes when he was a teenager; tolerated cefazolin    Scheduled Meds: . aspirin EC  81 mg Oral q morning  . Chlorhexidine Gluconate Cloth  6 each Topical Daily  . diclofenac Sodium  2 g Topical QID  . empagliflozin  25 mg Oral q morning  . enoxaparin (LOVENOX) injection  40 mg Subcutaneous Q24H  . gabapentin  300 mg Oral QHS  . insulin aspart  0-15 Units Subcutaneous TID WC  . lisinopril  10 mg Oral Daily  . multivitamin with minerals  1 tablet Oral q morning  . senna-docusate  1 tablet Oral BID  . vitamin B-12  1,000 mcg Oral Daily   Continuous Infusions: PRN Meds:.acetaminophen, cyclobenzaprine, hydrALAZINE, HYDROmorphone (DILAUDID) injection, loratadine, melatonin, oxyCODONE, polyethylene glycol, sodium chloride flush   SUBJECTIVE: Still significant pain left shoulder and minimal movement No f/c here off abx S/p IR aspirate left shoulder this morning  No other joint pain Left hand unable to fully grip due to pain, presumed cts by patient chronic issue No dmard for psoriasis since 06/2020 which had was getting otezla  No recent pna No cough/chest pain/sob  Review of Systems: ROS All other ROS was negative, except mentioned above     OBJECTIVE: Vitals:   12/01/20 1720 12/02/20 0012 12/02/20 0433 12/02/20 1230  BP: 127/90 109/83 111/84 127/89  Pulse: 94 (!) 103 99 (!) 110  Resp: 18 18 18 16   Temp: 97.6 F (  36.4 C) 98.8 F (37.1 C) 98.8 F (37.1 C) 98.7 F (37.1 C)  TempSrc: Oral Oral Oral Oral  SpO2: 94% 97% 94% 95%  Weight:      Height:       Body mass index is 28.39 kg/m.  Physical Exam msk Left shoulder limited rom actively/passively - tender to palpation and rom No distress, fully conversant heent normocephalic; per; conj  clear; oropharynx clear cv rrr no mrg Lungs clear; normal respiratory effort abd s/nt Skin erythematous/scaly rash on trunk (known psoriasis) Neuro nonfocal  Lab Results Lab Results  Component Value Date   WBC 8.5 11/30/2020   HGB 13.0 11/30/2020   HCT 41.3 11/30/2020   MCV 90.4 11/30/2020   PLT 208 11/30/2020    Lab Results  Component Value Date   CREATININE 0.68 11/30/2020   BUN 16 11/30/2020   NA 134 (L) 11/30/2020   K 4.0 11/30/2020   CL 102 11/30/2020   CO2 24 11/30/2020    Lab Results  Component Value Date   ALT 61 (H) 11/30/2020   AST 44 (H) 11/30/2020   ALKPHOS 194 (H) 11/30/2020   BILITOT 0.6 11/30/2020      Microbiology: Recent Results (from the past 240 hour(s))  Blood culture (routine single)     Status: None (Preliminary result)   Collection Time: 11/27/20 11:43 AM   Specimen: Blood  Result Value Ref Range Status   MICRO NUMBER: [id number]  Preliminary   SPECIMEN QUALITY: Adequate  Preliminary   Source BLOOD 1  Preliminary   STATUS: PRELIMINARY  Preliminary   Result:   Preliminary    No growth to date. Culture is continuously monitored for a total of 120 hours incubation. A change in status will result in a phone report followed by an updated printed culture report.   COMMENT: Aerobic and anaerobic bottle received.  Preliminary  Blood culture (routine single)     Status: None (Preliminary result)   Collection Time: 11/27/20 11:46 AM   Specimen: Blood  Result Value Ref Range Status   MICRO NUMBER: [id number]  Preliminary   SPECIMEN QUALITY: Adequate  Preliminary   Source BLOOD 2  Preliminary   STATUS: PRELIMINARY  Preliminary   Result:   Preliminary    No growth to date. Culture is continuously monitored for a total of 120 hours incubation. A change in status will result in a phone report followed by an updated printed culture report.   COMMENT: Aerobic and anaerobic bottle received.  Preliminary  Urine culture     Status: None   Collection Time:  11/29/20  1:18 PM   Specimen: In/Out Cath Urine  Result Value Ref Range Status   Specimen Description IN/OUT CATH URINE  Final   Special Requests NONE  Final   Culture   Final    NO GROWTH Performed at [facility], [place] [address], [place], [place] [address]    Report Status 11/30/2020 FINAL  Final  Resp Panel by RT-PCR (Flu A&B, Covid) Nasopharyngeal Swab     Status: None   Collection Time: 11/29/20  1:18 PM   Specimen: Nasopharyngeal Swab; Nasopharyngeal(NP) swabs in vial transport medium  Result Value Ref Range Status   SARS Coronavirus 2 by RT PCR NEGATIVE NEGATIVE Final    Comment: (NOTE) SARS-CoV-2 target nucleic acids are NOT DETECTED.  The SARS-CoV-2 RNA is generally detectable in upper respiratory specimens during the acute phase of infection. The lowest concentration of SARS-CoV-2 viral copies this assay can detect is  138 copies/mL. A negative result does not preclude SARS-Cov-2 infection and should not be used as the sole basis for treatment or other patient management decisions. A negative result may occur with  improper specimen collection/handling, submission of specimen other than nasopharyngeal swab, presence of viral mutation(s) within the areas targeted by this assay, and inadequate number of viral copies(<138 copies/mL). A negative result must be combined with clinical observations, patient history, and epidemiological information. The expected result is Negative.  Fact Sheet for Patients:  EntrepreneurPulse.com.au  Fact Sheet for Healthcare Providers:  IncredibleEmployment.be  This test is no t yet approved or cleared by the [place] FDA and  has been authorized for detection and/or diagnosis of SARS-CoV-2 by FDA under an Emergency Use Authorization (EUA). This EUA will remain  in effect (meaning this test can be used) for the duration of the COVID-19 declaration under Section 564(b)(1) of the Act,  21 U.S.C.section 360bbb-3(b)(1), unless the authorization is terminated  or revoked sooner.       Influenza A by PCR NEGATIVE NEGATIVE Final   Influenza B by PCR NEGATIVE NEGATIVE Final    Comment: (NOTE) The Xpert Xpress SARS-CoV-2/FLU/RSV plus assay is intended as an aid in the diagnosis of influenza from Nasopharyngeal swab specimens and should not be used as a sole basis for treatment. Nasal washings and aspirates are unacceptable for Xpert Xpress SARS-CoV-2/FLU/RSV testing.  Fact Sheet for Patients: EntrepreneurPulse.com.au  Fact Sheet for Healthcare Providers: IncredibleEmployment.be  This test is not yet approved or cleared by the [place] FDA and has been authorized for detection and/or diagnosis of SARS-CoV-2 by FDA under an Emergency Use Authorization (EUA). This EUA will remain in effect (meaning this test can be used) for the duration of the COVID-19 declaration under Section 564(b)(1) of the Act, 21 U.S.C. section 360bbb-3(b)(1), unless the authorization is terminated or revoked.  Performed at [facility], [place] [address] [place], [place] [address]   Blood Culture (routine x 2)     Status: None (Preliminary result)   Collection Time: 11/29/20  2:20 PM   Specimen: BLOOD RIGHT ARM  Result Value Ref Range Status   Specimen Description BLOOD RIGHT ARM  Final   Special Requests   Final    BOTTLES DRAWN AEROBIC AND ANAEROBIC Blood Culture adequate volume   Culture   Final    NO GROWTH 3 DAYS Performed at [facility], [place] [address], [place], [place] [address]    Report Status PENDING  Incomplete  Blood Culture (routine x 2)     Status: None (Preliminary result)   Collection Time: 11/29/20  9:05 PM   Specimen: BLOOD LEFT HAND  Result Value Ref Range Status   Specimen Description BLOOD LEFT HAND  Final   Special Requests   Final    BOTTLES DRAWN AEROBIC AND ANAEROBIC Blood Culture adequate volume   Culture    Final    NO GROWTH 2 DAYS Performed at [facility], [place] [address] [place], [place] [address]    Report Status PENDING  Incomplete     Serology:   Imaging: If present, new imagings (plain films, ct scans, and mri) have been personally visualized and interpreted; radiology reports have been reviewed. Decision making incorporated into the Impression / Recommendations.  4/22 left shoulder mri FINDINGS: Rotator cuff: Rotator cuff is grossly intact. Severe supraspinatus and moderate infraspinatus tendinosis. Low-grade interstitial tearing of the distal supraspinatus tendon. No full-thickness rotator cuff tear.  Muscles: Patchy intramuscular edema  throughout the rotator cuff musculature as well as the deltoid musculature.  Biceps long head: Nonvisualization of the intra-articular portion of the long head biceps tendon which may reflect tear or prior tenodesis/tenotomy.  Acromioclavicular Joint: Mild arthropathy of the AC joint. Moderate sized complex subacromial-subdeltoid bursal fluid collection with thick peripheral enhancement (series 10, image 16).  Glenohumeral Joint: Moderate-sized complex glenohumeral joint effusion with thickened, enhancing synovium. No cartilage defect or erosion.  Labrum:  Persistent superior labral tear.  Bones: Interval development of bone marrow edema within the greater tuberosity underlying the infraspinatus tendon insertion site. Subcortical cyst or erosion at the greater tuberosity posteriorly (series 6, image 6). No fracture or dislocation.  Other: Diffuse soft tissue swelling about the shoulder.  IMPRESSION: 1. Findings compatible with septic arthritis of the left glenohumeral joint and subacromial/subdeltoid septic bursitis. 2. Interval development of bone marrow edema at the posterior aspect of the greater tuberosity underlying the infraspinatus tendon insertion site with possible small erosion. Findings are  concerning for developing osteomyelitis. 3. Patchy intramuscular edema throughout the rotator cuff musculature as well as the deltoid musculature suggesting myositis.   4/22 panorex No abnormality  4/23 upper ext duplex Right superficial thrombus indeterminate age   Jabier Mutton, MD [facility] for [place] [phone] pager    12/02/2020, 5:36 PM

## 2020-12-02 NOTE — Telephone Encounter (Signed)
Patient called to provide fax number to which FMLA forms need to be sent. He requests they be addressed to Ida Rogue and faxed to [phone].  Patient needs to sign medical release form that was included in FMLA papers. RN faxed this form to [facility] and patient states he will fill out the form and have hospital staff fax it back to our office.   RN will fax complete FMLA packet once signed medical release form is received.   Beryle Flock, RN

## 2020-12-02 NOTE — Progress Notes (Signed)
PT Cancellation Note  Patient Details Name: KAZMIR OKI MRN: [id number] DOB: 09/08/1970   Cancelled Treatment:    Reason Eval/Treat Not Completed: Other (comment) Pt currently in the shower. Will follow up as schedule allows.   Lou Miner, DPT  Acute Rehabilitation Services  Pager: [phone] Office: [phone]    Rudean Hitt 12/02/2020, 12:57 PM

## 2020-12-02 NOTE — TOC Initial Note (Signed)
Transition of Care [facility]) - Initial/Assessment Note    Patient Details  Name: Logan [name] MRN: [id number] Date of Birth: [birth year]  [facility]) CM/SW Contact:    Marilu Favre, RN Phone Number: 12/02/2020, 12:25 PM  Clinical Narrative:                 Patient from home alone. Prior to admission was doing IV ABX at home. [facility] and [facility] for nursing.   Pam with [facility] aware of admission.   TOC team will continue to follow for discharge needs.   Expected Discharge Plan: [place] Barriers to Discharge: Continued Medical Work up   Patient Goals and CMS Choice Patient states their goals for this hospitalization and ongoing recovery are:: to return to home CMS Medicare.gov Compare Post Acute Care list provided to:: Patient Choice offered to / list presented to : Patient  Expected Discharge Plan and Services Expected Discharge Plan: [place] Choice: [place] arrangements for the past 2 months: Single Family Home                   DME Agency: NA                  Prior Living Arrangements/Services Living arrangements for the past 2 months: Single Family Home Lives with:: Self Patient language and need for interpreter reviewed:: Yes        Need for Family Participation in Patient Care: Yes (Comment) Care giver support system in place?: Yes (comment)   Criminal Activity/Legal Involvement Pertinent to Current Situation/Hospitalization: No - Comment as needed  Activities of Daily Living Home Assistive Devices/Equipment: Eyeglasses ADL Screening (condition at time of admission) Patient's cognitive ability adequate to safely complete daily activities?: Yes Is the patient deaf or have difficulty hearing?: No Does the patient have difficulty seeing, even when wearing glasses/contacts?: No Does the patient have difficulty concentrating, remembering, or making  decisions?: No Patient able to express need for assistance with ADLs?: Yes Does the patient have difficulty dressing or bathing?: Yes Independently performs ADLs?: Yes (appropriate for developmental age) Does the patient have difficulty walking or climbing stairs?: No Weakness of Legs: None Weakness of Arms/Hands: Left  Permission Sought/Granted   Permission granted to share information with : No              Emotional Assessment Appearance:: Appears stated age Attitude/Demeanor/Rapport: Engaged Affect (typically observed): Accepting Orientation: : Oriented to Self,Oriented to Place,Oriented to  Time,Oriented to Situation Alcohol / Substance Use: Not Applicable Psych Involvement: No (comment)  Admission diagnosis:  Septic arthritis ([place]) [M00.9] Fever and chills [R50.9] Fever [R50.9] Pyogenic arthritis of left shoulder region, due to unspecified organism [facility]) [M00.9] Patient Active Problem List   Diagnosis Date Noted  . Septic arthritis ([place]) 11/29/2020  . Fever 11/27/2020  . Sepsis ([place]) 11/15/2020  . Septic arthritis of shoulder, left ([place])   . Type 2 diabetes mellitus with diabetic polyneuropathy, with long-term current use of insulin ([place]) 07/05/2019  . Allergic rhinitis with a nonallergic component 12/08/2016  . Psoriasis 12/08/2016  . Diabetes ([place]) 05/31/2012  . Hypertension 05/31/2012   PCP:  Rudene Anda, MD Pharmacy:   [id number] [place], [place] [address] Phone: [phone] Fax: [phone]     Social Determinants of Health (SDOH) Interventions    Readmission Risk Interventions No flowsheet  data found.

## 2020-12-02 NOTE — Progress Notes (Signed)
Progress Note    Logan [name]  LOV:[id number] DOB: 20-Aug-1970  DOA: 11/29/2020 PCP: Rudene Anda, MD    Brief Narrative:     Medical records reviewed and are as summarized below:  Logan [name] is an 50 y.o. male with medical history significant of IDDM, HTN, HLD, recent diagnosed left shoulder septic arthritis, presented with worsening of left shoulder pain and fever.Patient was admitted on April 9th for left shoulder septic arthritis.  Arthroscopic incision and drainage and debridement done on 04/11.  According to OR record, culture was not able to obtain.  Sent home based IV vancomycin via right arm PICC line.  And patient been following with orthopedic surgery as well as infectious disease.  Patient reported that the left shoulder pain never went away, and on the contrary has been gradually getting worse.  Assessment/Plan:   Active Problems:   Septic arthritis of shoulder, left (HCC)   Septic arthritis (HCC)   Left shoulder septic arthritis vs another inflammatory condition -had been on -ID consulted in ED, who recommend discontinue Levaquin and continue Vancomycin. - IR joint aspiration with synovial fluid studies-- no crystals, low WBCs-- will get ANA and RF-- per patient his family (sister and mother) have RA -MRI shoulder:  1. Findings compatible with septic arthritis of the left glenohumeral joint and subacromial/subdeltoid septic bursitis. 2. Interval development of bone marrow edema at the posterior aspect of the greater tuberosity underlying the infraspinatus tendon insertion site with possible small erosion. Findings are concerning for developing osteomyelitis. 3. Patchy intramuscular edema throughout the rotator cuff musculature as well as the deltoid musculature suggesting myositis.  Sinus tachycardia -With mild elevation of lactate level, suspect early sepsis, will give 1 bolus of IV fluids -add IV Pain meds-- requesting dilaudid  Mild  transaminitis -New compared to the level on last admission. No RUQ pain. Will hold Statin for now.  IDDM with hyperglycemia -Recent A1C 8.3% -Continue Jardiance -Add sliding scale.  HTN -resume home meds  HLD -Hold Statin as above.   OT/PT eval  Family Communication/Anticipated D/C date and plan/Code Status   DVT prophylaxis: Lovenox ordered. Code Status: Full Code.  Disposition Plan: Status is: Inpatient  Remains inpatient appropriate because:Inpatient level of care appropriate due to severity of illness   Dispo: The patient is from: Home              Anticipated d/c is to: Home              Patient currently is not medically stable to d/c.   Difficult to place patient No         Medical Consultants:    ID  ortho   Anti-Infectives:    Holding pending determination of surgery  Subjective:  Continue to c/o pain Denies fever  Objective:    Vitals:   12/01/20 1720 12/02/20 0012 12/02/20 0433 12/02/20 1230  BP: 127/90 109/83 111/84 127/89  Pulse: 94 (!) 103 99 (!) 110  Resp: 18 18 18 16   Temp: 97.6 F (36.4 C) 98.8 F (37.1 C) 98.8 F (37.1 C) 98.7 F (37.1 C)  TempSrc: Oral Oral Oral Oral  SpO2: 94% 97% 94% 95%  Weight:      Height:        Intake/Output Summary (Last 24 hours) at 12/02/2020 1438 Last data filed at 12/02/2020 1100 Gross per 24 hour  Intake 480 ml  Output --  Net 480 ml   Filed Weights   11/29/20 1306 11/29/20 1900  Weight: 72.1 kg 75 kg    Exam:   General: Appearance:     Overweight male in no acute distress     Lungs:      respirations unlabored  Heart:    Tachycardic. Normal rhythm. No murmurs, rubs, or gallops.   MS:   All extremities are intact.   Neurologic:   Awake, alert, oriented x 3. No apparent focal neurological           defect.       Data Reviewed:   I have personally reviewed following labs and imaging studies:  Labs: Labs show the following:   Basic Metabolic Panel: Recent Labs   Lab 11/29/20 1329 11/30/20 0251  NA 133* 134*  K 4.2 4.0  CL 99 102  CO2 22 24  GLUCOSE 188* 101*  BUN 15 16  CREATININE 0.85 0.68  CALCIUM 8.9 8.7*   GFR Estimated Creatinine Clearance: 100.1 mL/min (by C-G formula based on SCr of 0.68 mg/dL). Liver Function Tests: Recent Labs  Lab 11/29/20 1329 11/30/20 0251  AST 52* 44*  ALT 63* 61*  ALKPHOS 221* 194*  BILITOT 0.3 0.6  PROT 8.0 7.2  ALBUMIN 2.8* 2.5*   No results for input(s): LIPASE, AMYLASE in the last 168 hours. No results for input(s): AMMONIA in the last 168 hours. Coagulation profile Recent Labs  Lab 11/29/20 1329  INR 1.1    CBC: Recent Labs  Lab 11/29/20 1329 11/30/20 0251  WBC 9.7 8.5  NEUTROABS 6.1  --   HGB 14.1 13.0  HCT 44.5 41.3  MCV 89.5 90.4  PLT 142* 208   Cardiac Enzymes: No results for input(s): CKTOTAL, CKMB, CKMBINDEX, TROPONINI in the last 168 hours. BNP (last 3 results) No results for input(s): PROBNP in the last 8760 hours. CBG: Recent Labs  Lab 12/01/20 1126 12/01/20 1640 12/01/20 2155 12/02/20 0627 12/02/20 1118  GLUCAP 133* 146* 171* 121* 163*   D-Dimer: No results for input(s): DDIMER in the last 72 hours. Hgb A1c: No results for input(s): HGBA1C in the last 72 hours. Lipid Profile: No results for input(s): CHOL, HDL, LDLCALC, TRIG, CHOLHDL, LDLDIRECT in the last 72 hours. Thyroid function studies: No results for input(s): TSH, T4TOTAL, T3FREE, THYROIDAB in the last 72 hours.  Invalid input(s): FREET3 Anemia work up: No results for input(s): VITAMINB12, FOLATE, FERRITIN, TIBC, IRON, RETICCTPCT in the last 72 hours. Sepsis Labs: Recent Labs  Lab 11/29/20 1329 11/29/20 2104 11/30/20 0251  WBC 9.7  --  8.5  LATICACIDVEN 2.0* 1.2  --     Microbiology Recent Results (from the past 240 hour(s))  Blood culture (routine single)     Status: None (Preliminary result)   Collection Time: 11/27/20 11:43 AM   Specimen: Blood  Result Value Ref Range Status    MICRO NUMBER: [id number]  Preliminary   SPECIMEN QUALITY: Adequate  Preliminary   Source BLOOD 1  Preliminary   STATUS: PRELIMINARY  Preliminary   Result:   Preliminary    No growth to date. Culture is continuously monitored for a total of 120 hours incubation. A change in status will result in a phone report followed by an updated printed culture report.   COMMENT: Aerobic and anaerobic bottle received.  Preliminary  Blood culture (routine single)     Status: None (Preliminary result)   Collection Time: 11/27/20 11:46 AM   Specimen: Blood  Result Value Ref Range Status   MICRO NUMBER: [id number]  Preliminary   SPECIMEN QUALITY: Adequate  Preliminary  Source BLOOD 2  Preliminary   STATUS: PRELIMINARY  Preliminary   Result:   Preliminary    No growth to date. Culture is continuously monitored for a total of 120 hours incubation. A change in status will result in a phone report followed by an updated printed culture report.   COMMENT: Aerobic and anaerobic bottle received.  Preliminary  Urine culture     Status: None   Collection Time: 11/29/20  1:18 PM   Specimen: In/Out Cath Urine  Result Value Ref Range Status   Specimen Description IN/OUT CATH URINE  Final   Special Requests NONE  Final   Culture   Final    NO GROWTH Performed at [facility], [place] [address], [place], [place] [address]    Report Status 11/30/2020 FINAL  Final  Resp Panel by RT-PCR (Flu A&B, Covid) Nasopharyngeal Swab     Status: None   Collection Time: 11/29/20  1:18 PM   Specimen: Nasopharyngeal Swab; Nasopharyngeal(NP) swabs in vial transport medium  Result Value Ref Range Status   SARS Coronavirus 2 by RT PCR NEGATIVE NEGATIVE Final    Comment: (NOTE) SARS-CoV-2 target nucleic acids are NOT DETECTED.  The SARS-CoV-2 RNA is generally detectable in upper respiratory specimens during the acute phase of infection. The lowest concentration of SARS-CoV-2 viral copies this assay can detect is 138 copies/mL. A  negative result does not preclude SARS-Cov-2 infection and should not be used as the sole basis for treatment or other patient management decisions. A negative result may occur with  improper specimen collection/handling, submission of specimen other than nasopharyngeal swab, presence of viral mutation(s) within the areas targeted by this assay, and inadequate number of viral copies(<138 copies/mL). A negative result must be combined with clinical observations, patient history, and epidemiological information. The expected result is Negative.  Fact Sheet for Patients:  EntrepreneurPulse.com.au  Fact Sheet for Healthcare Providers:  IncredibleEmployment.be  This test is no t yet approved or cleared by the [place] FDA and  has been authorized for detection and/or diagnosis of SARS-CoV-2 by FDA under an Emergency Use Authorization (EUA). This EUA will remain  in effect (meaning this test can be used) for the duration of the COVID-19 declaration under Section 564(b)(1) of the Act, 21 U.S.C.section 360bbb-3(b)(1), unless the authorization is terminated  or revoked sooner.       Influenza A by PCR NEGATIVE NEGATIVE Final   Influenza B by PCR NEGATIVE NEGATIVE Final    Comment: (NOTE) The Xpert Xpress SARS-CoV-2/FLU/RSV plus assay is intended as an aid in the diagnosis of influenza from Nasopharyngeal swab specimens and should not be used as a sole basis for treatment. Nasal washings and aspirates are unacceptable for Xpert Xpress SARS-CoV-2/FLU/RSV testing.  Fact Sheet for Patients: EntrepreneurPulse.com.au  Fact Sheet for Healthcare Providers: IncredibleEmployment.be  This test is not yet approved or cleared by the [place] FDA and has been authorized for detection and/or diagnosis of SARS-CoV-2 by FDA under an Emergency Use Authorization (EUA). This EUA will remain in effect (meaning this test can  be used) for the duration of the COVID-19 declaration under Section 564(b)(1) of the Act, 21 U.S.C. section 360bbb-3(b)(1), unless the authorization is terminated or revoked.  Performed at [facility], [place] [address], [place], [place] [address]   Blood Culture (routine x 2)     Status: None (Preliminary result)   Collection Time: 11/29/20  2:20 PM   Specimen: BLOOD RIGHT ARM  Result Value Ref Range  Status   Specimen Description BLOOD RIGHT ARM  Final   Special Requests   Final    BOTTLES DRAWN AEROBIC AND ANAEROBIC Blood Culture adequate volume   Culture   Final    NO GROWTH 3 DAYS Performed at [facility], [address] [place], [place] [address]    Report Status PENDING  Incomplete  Blood Culture (routine x 2)     Status: None (Preliminary result)   Collection Time: 11/29/20  9:05 PM   Specimen: BLOOD LEFT HAND  Result Value Ref Range Status   Specimen Description BLOOD LEFT HAND  Final   Special Requests   Final    BOTTLES DRAWN AEROBIC AND ANAEROBIC Blood Culture adequate volume   Culture   Final    NO GROWTH 2 DAYS Performed at [facility], [place] [address] [place], [place] [address]    Report Status PENDING  Incomplete    Procedures and diagnostic studies:  DG FLUORO GUIDED NEEDLE PLC ASPIRATION/INJECTION LOC  Result Date: 12/02/2020 CLINICAL DATA:  Postop infection in LEFT shoulder. EXAM: LEFT shoulder aspiration. TECHNIQUE: Time-out in consent were performed. An appropriate skin entrance site was determined. The site was marked, prepped with Betadine, draped in the usual sterile fashion, and infiltrated locally with buffered Lidocaine. Twenty gauge spinal needle was advanced to the superomedial margin of the humeral head under intermittent fluoroscopy. Attempts were made to aspirate the joint yielding scant bloody material on the first attempt. A second needle was passed given that the first needle appeared to be occluded after sample was obtained.  Second needle yielded no fluid but was rinsed in place in a sample container. FLUOROSCOPY TIME:  Fluoroscopy Time:  36 Radiation Exposure Index (if provided by the fluoroscopic device): 1.3 Number of Acquired Spot Images: 0 FINDINGS: Needle was advanced to the medial margin of the humeral head at the level of the coracoid, this was repeated as described above. No immediate complications were noted. IMPRESSION: Technically successful shoulder aspiration yielding scant material as described. Electronically Signed   By: Zetta Bills M.D.   On: 12/02/2020 09:57    Medications:   . alteplase  2 mg Intracatheter Once  . aspirin EC  81 mg Oral q morning  . Chlorhexidine Gluconate Cloth  6 each Topical Daily  . diclofenac Sodium  2 g Topical QID  . empagliflozin  25 mg Oral q morning  . enoxaparin (LOVENOX) injection  40 mg Subcutaneous Q24H  . gabapentin  300 mg Oral QHS  . insulin aspart  0-15 Units Subcutaneous TID WC  . lisinopril  10 mg Oral Daily  . multivitamin with minerals  1 tablet Oral q morning  . senna-docusate  1 tablet Oral BID  . vitamin B-12  1,000 mcg Oral Daily   Continuous Infusions:   LOS: 3 days   Geradine Girt  [facility]   How to contact the [facility] Attending or Consulting provider [place] or covering provider during after hours [place], for this patient?  1. Check the care team in [facility] and look for a) attending/consulting [facility] provider listed and b) the [facility] team listed 2. Log into [email or web address] and use [place]'s universal password to access. If you do not have the password, please contact the hospital operator. 3. Locate the [facility] provider you are looking for under [facility] and page to a number that you can be directly reached. 4. If you still have difficulty reaching the provider, please page  the [facility] (Director on Call) for the Hospitalists listed on amion for assistance.  12/02/2020, 2:38 PM

## 2020-12-03 ENCOUNTER — Other Ambulatory Visit (HOSPITAL_COMMUNITY): Payer: Self-pay

## 2020-12-03 DIAGNOSIS — M25519 Pain in unspecified shoulder: Secondary | ICD-10-CM | POA: Diagnosis not present

## 2020-12-03 DIAGNOSIS — M25512 Pain in left shoulder: Secondary | ICD-10-CM | POA: Diagnosis not present

## 2020-12-03 DIAGNOSIS — Z794 Long term (current) use of insulin: Secondary | ICD-10-CM | POA: Diagnosis not present

## 2020-12-03 DIAGNOSIS — M009 Pyogenic arthritis, unspecified: Secondary | ICD-10-CM | POA: Diagnosis not present

## 2020-12-03 DIAGNOSIS — E1165 Type 2 diabetes mellitus with hyperglycemia: Secondary | ICD-10-CM | POA: Diagnosis not present

## 2020-12-03 DIAGNOSIS — E119 Type 2 diabetes mellitus without complications: Secondary | ICD-10-CM | POA: Diagnosis not present

## 2020-12-03 LAB — GC/CHLAMYDIA PROBE AMP (~~LOC~~) NOT AT ARMC
Chlamydia: NEGATIVE
Comment: NEGATIVE
Comment: NORMAL
Neisseria Gonorrhea: NEGATIVE

## 2020-12-03 LAB — CULTURE, BLOOD (SINGLE)
MICRO NUMBER:: 11792317
MICRO NUMBER:: 11792318
Result:: NO GROWTH
Result:: NO GROWTH
SPECIMEN QUALITY:: ADEQUATE
SPECIMEN QUALITY:: ADEQUATE

## 2020-12-03 LAB — RHEUMATOID FACTOR: Rheumatoid fact SerPl-aCnc: <10 IU/mL (ref <14.0)

## 2020-12-03 LAB — GLUCOSE, CAPILLARY
Glucose-Capillary: 133 mg/dL — ABNORMAL HIGH (ref 70–99)
Glucose-Capillary: 115 mg/dL — ABNORMAL HIGH (ref 70–99)
Glucose-Capillary: 169 mg/dL — ABNORMAL HIGH (ref 70–99)

## 2020-12-03 LAB — ANTINUCLEAR ANTIBODIES, IFA: ANA Ab, IFA: NEGATIVE

## 2020-12-03 MED ORDER — DOXYCYCLINE HYCLATE 100 MG PO TABS
100.0000 mg | ORAL_TABLET | Freq: Two times a day (BID) | ORAL | 0 refills | Status: AC
  Filled 2020-12-03: qty 56, 28d supply, fill #0

## 2020-12-03 MED ORDER — OXYCODONE HCL 10 MG PO TABS
10.0000 mg | ORAL_TABLET | Freq: Four times a day (QID) | ORAL | 0 refills | Status: AC | PRN
  Filled 2020-12-03: qty 10, 3d supply, fill #0

## 2020-12-03 MED ORDER — IBUPROFEN 600 MG PO TABS
600.0000 mg | ORAL_TABLET | Freq: Four times a day (QID) | ORAL | 0 refills | Status: AC | PRN
Start: 2020-12-03 — End: ?

## 2020-12-03 MED ORDER — LEVOFLOXACIN 500 MG PO TABS: 750.0000 mg | ORAL_TABLET | Freq: Every day | ORAL | Status: DC

## 2020-12-03 MED ORDER — LEVOFLOXACIN 500 MG PO TABS
500.0000 mg | ORAL_TABLET | Freq: Every day | ORAL | 0 refills | Status: AC
  Filled 2020-12-03: qty 28, 28d supply, fill #0

## 2020-12-03 MED ORDER — IBUPROFEN 400 MG PO TABS
600.0000 mg | ORAL_TABLET | Freq: Four times a day (QID) | ORAL | Status: DC | PRN
  Filled 2020-12-03: qty 1

## 2020-12-03 NOTE — Progress Notes (Signed)
[facility] Infectious Disease  Date of Admission:  11/29/2020     CC: Fever Left shoulder septic arthritis  Lines: 4/12 - c rue picc   Abx: 4/25-c doxy/ceftriaxone   4/20-4/22 levoflox 4/15-4/22 vanc    ASSESSMENT: Acute monoarthritis with periarticular myositis/soft tissue inflammation left shoulder; onset 4/08 Psoriasis previously on DMARD (otezla as of 06/2020) Hx pcn alelrgy (rash) distant past (20-30 years pta); tolerated cefepime 4/08  Patient initially had arthroscopic I&D left shoulder and rotator cuff after 3 days vanc/cefepime; cx not sent. Initial admission 4/08 bcx before abx negative. This admission 4/22 bcx also negative in setting vanc/levo  He had fever initial admission 4/08 and then after discharge despite levofloxacin addition to vanc (although only for 2 days).  Ortho reevaluated this admission so far deemed no need for I&D. He had undergone IR aspiration 4/25 and fluid analysis only showed 18 wbc. He has improving crp.   Monoarticular arthritis would be ID most likely once crystal arthropathy/lyme/gonoococcal process r/o'ed. Without hardware, I would consider staph/strep as most common cause for it. While otezla is less likely than tnf-a inhibitor to cause atypical infection, I would consider this in him (endemic fungi/mycobacteria). He has no respiratory sx otherwise or recent pna but still would consider  Unfortunately no fungal/afb cx sent from joint aspirate; unlikely of yield as fluid aspirate minimal ("scant bloody material obtained")  ----------- 4/26 assessment Patient refused to have IV abx/picc I discussed oral options with him. Ideally would like to avoid quinolone as at this time we are not sure what the cause is. AFB process might be suppressed by quinolone in an undesireable way, so if near future reevaluation might be difficult if no improvement. Oral beta-lactam not ideal  As above, given negative w/u so far for  non-id causes, and monoarticular arthritis, would like to cover empirically with now both gp/gnr process (fever through vanc) Quant gold pending can f/u outpatient Urine gc/chlam negative No lyme risk  Of note he has distant pcn allergy >20 years ago minor rash. Reasonable to pursue outpatient pcn testing. Not taken pcn products since but tolerated ceftriaxone here     PLAN: 1. Stop ceftriaxone 2. Start levoflox 750 mg po daily; continue doxy 100 mg po bid 3. Plan 4 weeks until 5/24 4. F/u dr Tommy Medal 5/12 @ 9am rcid; can f/u on quant gold and final joint fluid cx 5. If ok with ortho, ok to dc from id standpoint 6.   Discussed with primary team   I spent more than 35 minute reviewing data/chart, and coordinating care and >50% direct face to face time providing counseling/discussing diagnostics/treatment plan with patient    Active Problems:   Diabetes ([place])   Shoulder pain   Allergies  Allergen Reactions  . Penicillins Hives and Rash    Red rashes when he was a teenager; tolerated cefazolin    Scheduled Meds: . aspirin EC  81 mg Oral q morning  . Chlorhexidine Gluconate Cloth  6 each Topical Daily  . diclofenac Sodium  2 g Topical QID  . doxycycline  100 mg Oral Q12H  . empagliflozin  25 mg Oral q morning  . enoxaparin (LOVENOX) injection  40 mg Subcutaneous Q24H  . gabapentin  300 mg Oral QHS  . insulin aspart  0-15 Units Subcutaneous TID WC  . lisinopril  10 mg Oral Daily  . multivitamin with minerals  1 tablet Oral q morning  . senna-docusate  1 tablet Oral BID  . vitamin B-12  1,000 mcg Oral Daily   Continuous Infusions: . cefTRIAXone (ROCEPHIN)  IV 2 g (12/02/20 1843)   PRN Meds:.acetaminophen, cyclobenzaprine, hydrALAZINE, HYDROmorphone (DILAUDID) injection, ibuprofen, loratadine, melatonin, oxyCODONE, polyethylene glycol, sodium chloride flush   SUBJECTIVE: Pain improved since initial onset but moderate still left shoulder Relatively intact rom but  appears to be only 40-50% normal No n/v/f/c/diarrhea  Fluid cx ngtd No crystal  Review of Systems: ROS All other ROS was negative, except mentioned above     OBJECTIVE: Vitals:   12/02/20 2101 12/03/20 0410 12/03/20 1237 12/03/20 1652  BP: 110/82 110/70 118/89 114/86  Pulse: (!) 101 100 93 (!) 103  Resp: 18 18 18 18   Temp: 99.1 F (37.3 C) 99.7 F (37.6 C) 98.7 F (37.1 C) 98.6 F (37 C)  TempSrc: Oral Oral Oral Oral  SpO2: 97% 93% 96% 98%  Weight:      Height:       Body mass index is 28.39 kg/m.  Physical Exam General/constitutional: no distress, pleasant HEENT: Normocephalic, PER, Conj Clear, EOMI, Oropharynx clear Neck supple CV: rrr no mrg Lungs: clear to auscultation, normal respiratory effort Abd: Soft, Nontender Ext: no edema Skin: psoriatic rash back of trunk Neuro: nonfocal MSK: no peripheral joint swelling/tenderness/warmth; back spines nontender   Central line presence: rue picc site nontender, nonpurulent   Lab Results Lab Results  Component Value Date   WBC 8.5 11/30/2020   HGB 13.0 11/30/2020   HCT 41.3 11/30/2020   MCV 90.4 11/30/2020   PLT 208 11/30/2020    Lab Results  Component Value Date   CREATININE 0.68 11/30/2020   BUN 16 11/30/2020   NA 134 (L) 11/30/2020   K 4.0 11/30/2020   CL 102 11/30/2020   CO2 24 11/30/2020    Lab Results  Component Value Date   ALT 61 (H) 11/30/2020   AST 44 (H) 11/30/2020   ALKPHOS 194 (H) 11/30/2020   BILITOT 0.6 11/30/2020      Microbiology: Recent Results (from the past 240 hour(s))  Blood culture (routine single)     Status: None   Collection Time: 11/27/20 11:43 AM   Specimen: Blood  Result Value Ref Range Status   MICRO NUMBER: [id number]  Final   SPECIMEN QUALITY: Adequate  Final   Source BLOOD 1  Final   STATUS: FINAL  Final   Result: No growth after 5 days  Final   COMMENT: Aerobic and anaerobic bottle received.  Final  Blood culture (routine single)     Status: None    Collection Time: 11/27/20 11:46 AM   Specimen: Blood  Result Value Ref Range Status   MICRO NUMBER: [id number]  Final   SPECIMEN QUALITY: Adequate  Final   Source BLOOD 2  Final   STATUS: FINAL  Final   Result: No growth after 5 days  Final   COMMENT: Aerobic and anaerobic bottle received.  Final  Urine culture     Status: None   Collection Time: 11/29/20  1:18 PM   Specimen: In/Out Cath Urine  Result Value Ref Range Status   Specimen Description IN/OUT CATH URINE  Final   Special Requests NONE  Final   Culture   Final    NO GROWTH Performed at [facility], [place] [address], [place],  [address]    Report Status 11/30/2020 FINAL  Final  Resp Panel by RT-PCR (Flu A&B, Covid) Nasopharyngeal Swab     Status: None  Collection Time: 11/29/20  1:18 PM   Specimen: Nasopharyngeal Swab; Nasopharyngeal(NP) swabs in vial transport medium  Result Value Ref Range Status   SARS Coronavirus 2 by RT PCR NEGATIVE NEGATIVE Final    Comment: (NOTE) SARS-CoV-2 target nucleic acids are NOT DETECTED.  The SARS-CoV-2 RNA is generally detectable in upper respiratory specimens during the acute phase of infection. The lowest concentration of SARS-CoV-2 viral copies this assay can detect is 138 copies/mL. A negative result does not preclude SARS-Cov-2 infection and should not be used as the sole basis for treatment or other patient management decisions. A negative result may occur with  improper specimen collection/handling, submission of specimen other than nasopharyngeal swab, presence of viral mutation(s) within the areas targeted by this assay, and inadequate number of viral copies(<138 copies/mL). A negative result must be combined with clinical observations, patient history, and epidemiological information. The expected result is Negative.  Fact Sheet for Patients:  EntrepreneurPulse.com.au  Fact Sheet for Healthcare Providers:   IncredibleEmployment.be  This test is no t yet approved or cleared by the [place] FDA and  has been authorized for detection and/or diagnosis of SARS-CoV-2 by FDA under an Emergency Use Authorization (EUA). This EUA will remain  in effect (meaning this test can be used) for the duration of the COVID-19 declaration under Section 564(b)(1) of the Act, 21 U.S.C.section 360bbb-3(b)(1), unless the authorization is terminated  or revoked sooner.       Influenza A by PCR NEGATIVE NEGATIVE Final   Influenza B by PCR NEGATIVE NEGATIVE Final    Comment: (NOTE) The Xpert Xpress SARS-CoV-2/FLU/RSV plus assay is intended as an aid in the diagnosis of influenza from Nasopharyngeal swab specimens and should not be used as a sole basis for treatment. Nasal washings and aspirates are unacceptable for Xpert Xpress SARS-CoV-2/FLU/RSV testing.  Fact Sheet for Patients: EntrepreneurPulse.com.au  Fact Sheet for Healthcare Providers: IncredibleEmployment.be  This test is not yet approved or cleared by the [place] FDA and has been authorized for detection and/or diagnosis of SARS-CoV-2 by FDA under an Emergency Use Authorization (EUA). This EUA will remain in effect (meaning this test can be used) for the duration of the COVID-19 declaration under Section 564(b)(1) of the Act, 21 U.S.C. section 360bbb-3(b)(1), unless the authorization is terminated or revoked.  Performed at [facility], [place] [address], [place], [place] [address]   Blood Culture (routine x 2)     Status: None (Preliminary result)   Collection Time: 11/29/20  2:20 PM   Specimen: BLOOD RIGHT ARM  Result Value Ref Range Status   Specimen Description BLOOD RIGHT ARM  Final   Special Requests   Final    BOTTLES DRAWN AEROBIC AND ANAEROBIC Blood Culture adequate volume   Culture   Final    NO GROWTH 4 DAYS Performed at [facility], [place] [address]  [place], [place] [address]    Report Status PENDING  Incomplete  Blood Culture (routine x 2)     Status: None (Preliminary result)   Collection Time: 11/29/20  9:05 PM   Specimen: BLOOD LEFT HAND  Result Value Ref Range Status   Specimen Description BLOOD LEFT HAND  Final   Special Requests   Final    BOTTLES DRAWN AEROBIC AND ANAEROBIC Blood Culture adequate volume   Culture   Final    NO GROWTH 3 DAYS Performed at [facility], [place] [address], [place], [place] [address]    Report Status PENDING  Incomplete  Body fluid culture w Gram Stain     Status: None (Preliminary result)   Collection Time: 12/02/20  9:41 AM   Specimen: PATH Cytology Misc. fluid; Synovial Fluid  Result Value Ref Range Status   Specimen Description SYNOVIAL  Final   Special Requests NONE  Final   Gram Stain   Final    RARE WBC PRESENT,BOTH PMN AND MONONUCLEAR NO ORGANISMS SEEN    Culture   Final    NO GROWTH < 24 HOURS Performed at [facility], [place] [address] [place], [place] [address]    Report Status PENDING  Incomplete     Serology:  Micro: 4/25 joint fluid (wbc < 100) cx ngtd 4/24 bcx negative  Imaging: If present, new imagings (plain films, ct scans, and mri) have been personally visualized and interpreted; radiology reports have been reviewed. Decision making incorporated into the Impression / Recommendations.  4/22 left shoulder mri FINDINGS: Rotator cuff: Rotator cuff is grossly intact. Severe supraspinatus and moderate infraspinatus tendinosis. Low-grade interstitial tearing of the distal supraspinatus tendon. No full-thickness rotator cuff tear.  Muscles: Patchy intramuscular edema throughout the rotator cuff musculature as well as the deltoid musculature.  Biceps long head: Nonvisualization of the intra-articular portion of the long head biceps tendon which may reflect tear or prior tenodesis/tenotomy.  Acromioclavicular Joint: Mild arthropathy of the AC joint.  Moderate sized complex subacromial-subdeltoid bursal fluid collection with thick peripheral enhancement (series 10, image 16).  Glenohumeral Joint: Moderate-sized complex glenohumeral joint effusion with thickened, enhancing synovium. No cartilage defect or erosion.  Labrum:  Persistent superior labral tear.  Bones: Interval development of bone marrow edema within the greater tuberosity underlying the infraspinatus tendon insertion site. Subcortical cyst or erosion at the greater tuberosity posteriorly (series 6, image 6). No fracture or dislocation.  Other: Diffuse soft tissue swelling about the shoulder.  IMPRESSION: 1. Findings compatible with septic arthritis of the left glenohumeral joint and subacromial/subdeltoid septic bursitis. 2. Interval development of bone marrow edema at the posterior aspect of the greater tuberosity underlying the infraspinatus tendon insertion site with possible small erosion. Findings are concerning for developing osteomyelitis. 3. Patchy intramuscular edema throughout the rotator cuff musculature as well as the deltoid musculature suggesting myositis.   4/22 panorex No abnormality  4/23 upper ext duplex Right superficial thrombus indeterminate age   Jabier Mutton, MD [facility] [place] [phone] pager    12/03/2020, 4:54 PM

## 2020-12-03 NOTE — Evaluation (Signed)
Physical Therapy Evaluation Patient Details Name: Logan [name] MRN: [id number] DOB: 07-02-1971 Today's Date: 12/03/2020   History of Present Illness  Logan [name] is a 50 y/o male who reported to ED with fever and worsening L shoulder pain. Pt with recent admission for I&D of L shoulder due top septic joint on 11/18/20. Admitted again on 11/29/20. Underwent another I&D on 12/02/20. PMH includes DM.    Clinical Impression  Pt sitting at EOB, cooperative and willing to participate in PT. Independent for all mobility, but demonstrated decreased shoulder ROM and strength with concurrent increase in pain. Most pain noted during eccentric portion of exercises and at end-range of motion. Session focused on exercises for improving ROM and postural control as well as education for pain management. However from mobility standpoint, pt at baseline and moving well. Believe pt will benefit from OT for further assistance. Will sign off for now. Thank you for the opportunity to participate in this patient's care.    Follow Up Recommendations Outpatient PT;Other (comment) (Outpatient PT for shoulder)    Equipment Recommendations  None recommended by PT    Recommendations for Other Services       Precautions / Restrictions Precautions Precaution Comments: Recent shoulder procedure Restrictions Weight Bearing Restrictions: Yes LUE Weight Bearing: Weight bearing as tolerated Other Position/Activity Restrictions: ROM as tolerated      Mobility  Bed Mobility Overal bed mobility: Independent                  Transfers Overall transfer level: Independent                  Ambulation/Gait Ambulation/Gait assistance: Independent Gait Distance (Feet): 300 Feet Assistive device: None Gait Pattern/deviations: WFL(Within Functional Limits)        Stairs            Wheelchair Mobility    Modified Rankin (Stroke Patients Only)       Balance Overall balance assessment: No apparent  balance deficits (not formally assessed)                                           Pertinent Vitals/Pain Pain Assessment: 0-10 Pain Score: 4  Pain Location: L shoulder Pain Descriptors / Indicators: Aching;Constant;Sore Pain Intervention(s): Monitored during session    Home Living Family/patient expects to be discharged to:: Private residence Living Arrangements: Alone Available Help at Discharge: Family;Available PRN/intermittently;Other (Comment) (brother) Type of Home: Other(Comment) (townhome) Home Access: Level entry     Home Layout: One level Home Equipment: None      Prior Function Level of Independence: Independent         Comments: works in Program[employer], applications, drives, 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 Able to complete bathing, dressing, and toileting independently but with increased pain     Hand Dominance   Dominant Hand: Right    Extremity/Trunk Assessment   Upper Extremity Assessment Upper Extremity Assessment: Defer to OT evaluation    Lower Extremity Assessment Lower Extremity Assessment: Overall WFL for tasks assessed    Cervical / Trunk Assessment Cervical / Trunk Assessment: Normal  Communication   Communication: No difficulties  Cognition Arousal/Alertness: Awake/alert Behavior During Therapy: WFL for tasks assessed/performed Overall Cognitive Status: Within Functional Limits for tasks assessed  General Comments      Exercises Shoulder Exercises Pendulum Exercise: AROM;Left;10 reps;Seated Shoulder Flexion: AAROM;Left;5 reps;Supine Other Exercises Other Exercises: Shoulder retraction x10 in seated   Assessment/Plan    PT Assessment Patent does not need any further PT services  PT Problem List Decreased strength;Decreased range of motion;Decreased activity tolerance       PT Treatment  Interventions      PT Goals (Current goals can be found in the Care Plan section)  Acute Rehab PT Goals Patient Stated Goal: get shoulder back to normal PT Goal Formulation: With patient Time For Goal Achievement: 12/17/20 Potential to Achieve Goals: Good    Frequency     Barriers to discharge        Co-evaluation               AM-PAC PT "6 Clicks" Mobility  Outcome Measure Help needed turning from your back to your side while in a flat bed without using bedrails?: None Help needed moving from lying on your back to sitting on the side of a flat bed without using bedrails?: None Help needed moving to and from a bed to a chair (including a wheelchair)?: None Help needed standing up from a chair using your arms (e.g., wheelchair or bedside chair)?: None Help needed to walk in hospital room?: None Help needed climbing 3-5 steps with a railing? : None 6 Click Score: 24    End of Session Equipment Utilized During Treatment: Gait belt Activity Tolerance: Patient tolerated treatment well Patient left: in bed;with call bell/phone within reach Nurse Communication: Mobility status PT Visit Diagnosis: Muscle weakness (generalized) (M62.81);Pain Pain - Right/Left: Left Pain - part of body: Shoulder    Time:  -      Charges:         Rosita Kea, SPT

## 2020-12-03 NOTE — Progress Notes (Signed)
Progress Note    Logan [name]  FBP:[id number] DOB: 01-15-1971  DOA: 11/29/2020 PCP: Rudene Anda, MD    Brief Narrative:     Medical records reviewed and are as summarized below:  Logan [name] is an 50 y.o. male with medical history significant of IDDM, HTN, HLD, recent diagnosed left shoulder septic arthritis, presented with worsening of left shoulder pain and fever.Patient was admitted on April 9th for left shoulder septic arthritis.  Arthroscopic incision and drainage and debridement done on 04/11.  According to OR record, culture was not able to obtain.  Sent home based IV vancomycin via right arm PICC line.  And patient been following with orthopedic surgery as well as infectious disease.  Patient reported that the left shoulder pain never went away, and on the contrary has been gradually getting worse.  Work up including aspiration has been unrevealing  Assessment/Plan:   Active Problems:   Diabetes (Logan [name])   Shoulder pain   Left shoulder septic arthritis vs another inflammatory condition -ID: Monoarticular arthritis would be ID most likely once crystal arthropathy/lyme/gonoococcal process r/o'ed. Without hardware, I would consider staph/strep as most common cause for it. While otezla is less likely than tnf-a inhibitor to cause atypical infection, I would consider this in him (endemic fungi/mycobacteria). He has no respiratory sx otherwise or recent pna but still would consider. Start doxy/ceftriaxone (tolerated cefepime) after urine gc/chlam obtained - IR joint aspiration with synovial fluid studies-- no crystals, low WBCs--  ANA pending,  RF-- per patient his family (sister and mother) have RA- RF negative -MRI shoulder:  1. Findings compatible with septic arthritis of the left glenohumeral joint and subacromial/subdeltoid septic bursitis. 2. Interval development of bone marrow edema at the posterior aspect of the greater tuberosity underlying the infraspinatus  tendon insertion site with possible small erosion. Findings are concerning for developing osteomyelitis. 3. Patchy intramuscular edema throughout the rotator cuff musculature as well as the deltoid musculature suggesting myositis. -PO and IV pain meds  Sinus tachycardia -improved  Mild transaminitis -New compared to the level on last admission. No RUQ pain. Will hold Statin for now.  IDDM with hyperglycemia -Recent A1C 8.3% -Continue Jardiance -Add sliding scale.  HTN -resume home meds  HLD -Hold Statin as above.   OT/PT eval  Family Communication/Anticipated D/C date and plan/Code Status   DVT prophylaxis: Lovenox ordered. Code Status: Full Code.  Disposition Plan: Status is: Inpatient  Remains inpatient appropriate because:Inpatient level of care appropriate due to severity of illness   Dispo: The patient is from: Home              Anticipated d/c is to: Home              Patient currently is not medically stable to d/c.   Difficult to place patient No         Medical Consultants:    ID  ortho     Subjective:  Multiple questions about MRI/lab results  Objective:    Vitals:   12/02/20 1828 12/02/20 2101 12/03/20 0410 12/03/20 1237  BP: (!) 130/95 110/82 110/70 118/89  Pulse: (!) 112 (!) 101 100 93  Resp: 16 18 18 18   Temp: 98.9 F (37.2 C) 99.1 F (37.3 C) 99.7 F (37.6 C) 98.7 F (37.1 C)  TempSrc: Oral Oral Oral Oral  SpO2: 98% 97% 93% 96%  Weight:      Height:        Intake/Output Summary (Last 24 hours) at  12/03/2020 1417 Last data filed at 12/03/2020 1000 Gross per 24 hour  Intake 600 ml  Output --  Net 600 ml   Filed Weights   11/29/20 1306 11/29/20 1900  Weight: 72.1 kg 75 kg    Exam:   General: Appearance:     Overweight male in no acute distress     Lungs:     respirations unlabored     MS:   All extremities are intact.   Neurologic:   Awake, alert, oriented x 3. No apparent focal neurological            defect.      Data Reviewed:   I have personally reviewed following labs and imaging studies:  Labs: Labs show the following:   Basic Metabolic Panel: Recent Labs  Lab 11/29/20 1329 11/30/20 0251  NA 133* 134*  K 4.2 4.0  CL 99 102  CO2 22 24  GLUCOSE 188* 101*  BUN 15 16  CREATININE 0.85 0.68  CALCIUM 8.9 8.7*   GFR Estimated Creatinine Clearance: 100.1 mL/min (by C-G formula based on SCr of 0.68 mg/dL). Liver Function Tests: Recent Labs  Lab 11/29/20 1329 11/30/20 0251  AST 52* 44*  ALT 63* 61*  ALKPHOS 221* 194*  BILITOT 0.3 0.6  PROT 8.0 7.2  ALBUMIN 2.8* 2.5*   No results for input(s): LIPASE, AMYLASE in the last 168 hours. No results for input(s): AMMONIA in the last 168 hours. Coagulation profile Recent Labs  Lab 11/29/20 1329  INR 1.1    CBC: Recent Labs  Lab 11/29/20 1329 11/30/20 0251  WBC 9.7 8.5  NEUTROABS 6.1  --   HGB 14.1 13.0  HCT 44.5 41.3  MCV 89.5 90.4  PLT 142* 208   Cardiac Enzymes: No results for input(s): CKTOTAL, CKMB, CKMBINDEX, TROPONINI in the last 168 hours. BNP (last 3 results) No results for input(s): PROBNP in the last 8760 hours. CBG: Recent Labs  Lab 12/02/20 1118 12/02/20 1612 12/02/20 2113 12/03/20 0606 12/03/20 1121  GLUCAP 163* 161* 122* 133* 115*   D-Dimer: No results for input(s): DDIMER in the last 72 hours. Hgb A1c: No results for input(s): HGBA1C in the last 72 hours. Lipid Profile: No results for input(s): CHOL, HDL, LDLCALC, TRIG, CHOLHDL, LDLDIRECT in the last 72 hours. Thyroid function studies: No results for input(s): TSH, T4TOTAL, T3FREE, THYROIDAB in the last 72 hours.  Invalid input(s): FREET3 Anemia work up: No results for input(s): VITAMINB12, FOLATE, FERRITIN, TIBC, IRON, RETICCTPCT in the last 72 hours. Sepsis Labs: Recent Labs  Lab 11/29/20 1329 11/29/20 2104 11/30/20 0251  WBC 9.7  --  8.5  LATICACIDVEN 2.0* 1.2  --     Microbiology Recent Results (from the past 240  hour(s))  Blood culture (routine single)     Status: None   Collection Time: 11/27/20 11:43 AM   Specimen: Blood  Result Value Ref Range Status   MICRO NUMBER: [id number]  Final   SPECIMEN QUALITY: Adequate  Final   Source BLOOD 1  Final   STATUS: FINAL  Final   Result: No growth after 5 days  Final   COMMENT: Aerobic and anaerobic bottle received.  Final  Blood culture (routine single)     Status: None   Collection Time: 11/27/20 11:46 AM   Specimen: Blood  Result Value Ref Range Status   MICRO NUMBER: [id number]  Final   SPECIMEN QUALITY: Adequate  Final   Source BLOOD 2  Final   STATUS: FINAL  Final  Result: No growth after 5 days  Final   COMMENT: Aerobic and anaerobic bottle received.  Final  Urine culture     Status: None   Collection Time: 11/29/20  1:18 PM   Specimen: In/Out Cath Urine  Result Value Ref Range Status   Specimen Description IN/OUT CATH URINE  Final   Special Requests NONE  Final   Culture   Final    NO GROWTH Performed at [facility], [place] [address], [place], [place] [address]    Report Status 11/30/2020 FINAL  Final  Resp Panel by RT-PCR (Flu A&B, Covid) Nasopharyngeal Swab     Status: None   Collection Time: 11/29/20  1:18 PM   Specimen: Nasopharyngeal Swab; Nasopharyngeal(NP) swabs in vial transport medium  Result Value Ref Range Status   SARS Coronavirus 2 by RT PCR NEGATIVE NEGATIVE Final    Comment: (NOTE) SARS-CoV-2 target nucleic acids are NOT DETECTED.  The SARS-CoV-2 RNA is generally detectable in upper respiratory specimens during the acute phase of infection. The lowest concentration of SARS-CoV-2 viral copies this assay can detect is 138 copies/mL. A negative result does not preclude SARS-Cov-2 infection and should not be used as the sole basis for treatment or Logan patient management decisions. A negative result may occur with  improper specimen collection/handling, submission of specimen Logan than nasopharyngeal swab, presence  of viral mutation(s) within the areas targeted by this assay, and inadequate number of viral copies(<138 copies/mL). A negative result must be combined with clinical observations, patient history, and epidemiological information. The expected result is Negative.  Fact Sheet for Patients:  EntrepreneurPulse.com.au  Fact Sheet for Healthcare Providers:  IncredibleEmployment.be  This test is no t yet approved or cleared by the [place] FDA and  has been authorized for detection and/or diagnosis of SARS-CoV-2 by FDA under an Emergency Use Authorization (EUA). This EUA will remain  in effect (meaning this test can be used) for the duration of the COVID-19 declaration under Section 564(b)(1) of the Act, 21 U.S.C.section 360bbb-3(b)(1), unless the authorization is terminated  or revoked sooner.       Influenza A by PCR NEGATIVE NEGATIVE Final   Influenza B by PCR NEGATIVE NEGATIVE Final    Comment: (NOTE) The Xpert Xpress SARS-CoV-2/FLU/RSV plus assay is intended as an aid in the diagnosis of influenza from Nasopharyngeal swab specimens and should not be used as a sole basis for treatment. Nasal washings and aspirates are unacceptable for Xpert Xpress SARS-CoV-2/FLU/RSV testing.  Fact Sheet for Patients: EntrepreneurPulse.com.au  Fact Sheet for Healthcare Providers: IncredibleEmployment.be  This test is not yet approved or cleared by the [place] FDA and has been authorized for detection and/or diagnosis of SARS-CoV-2 by FDA under an Emergency Use Authorization (EUA). This EUA will remain in effect (meaning this test can be used) for the duration of the COVID-19 declaration under Section 564(b)(1) of the Act, 21 U.S.C. section 360bbb-3(b)(1), unless the authorization is terminated or revoked.  Performed at [facility], [place] [address] [place], [place] [address]   Blood Culture (routine x 2)      Status: None (Preliminary result)   Collection Time: 11/29/20  2:20 PM   Specimen: BLOOD RIGHT ARM  Result Value Ref Range Status   Specimen Description BLOOD RIGHT ARM  Final   Special Requests   Final    BOTTLES DRAWN AEROBIC AND ANAEROBIC Blood Culture adequate volume   Culture   Final    NO GROWTH 4 DAYS Performed at  [facility], [address] [place], [place] [address]    Report Status PENDING  Incomplete  Blood Culture (routine x 2)     Status: None (Preliminary result)   Collection Time: 11/29/20  9:05 PM   Specimen: BLOOD LEFT HAND  Result Value Ref Range Status   Specimen Description BLOOD LEFT HAND  Final   Special Requests   Final    BOTTLES DRAWN AEROBIC AND ANAEROBIC Blood Culture adequate volume   Culture   Final    NO GROWTH 3 DAYS Performed at [facility], [address] [place], [place] [address]    Report Status PENDING  Incomplete  Body fluid culture w Gram Stain     Status: None (Preliminary result)   Collection Time: 12/02/20  9:41 AM   Specimen: PATH Cytology Misc. fluid; Synovial Fluid  Result Value Ref Range Status   Specimen Description SYNOVIAL  Final   Special Requests NONE  Final   Gram Stain   Final    RARE WBC PRESENT,BOTH PMN AND MONONUCLEAR NO ORGANISMS SEEN    Culture   Final    NO GROWTH < 24 HOURS Performed at [facility], [address] [place], [place] [address]    Report Status PENDING  Incomplete    Procedures and diagnostic studies:  DG FLUORO GUIDED NEEDLE PLC ASPIRATION/INJECTION LOC  Result Date: 12/02/2020 CLINICAL DATA:  Postop infection in LEFT shoulder. EXAM: LEFT shoulder aspiration. TECHNIQUE: Time-out in consent were performed. An appropriate skin entrance site was determined. The site was marked, prepped with Betadine, draped in the usual sterile fashion, and infiltrated locally with buffered Lidocaine. Twenty gauge spinal needle was advanced to the superomedial margin of the humeral head  under intermittent fluoroscopy. Attempts were made to aspirate the joint yielding scant bloody material on the first attempt. A second needle was passed given that the first needle appeared to be occluded after sample was obtained. Second needle yielded no fluid but was rinsed in place in a sample container. FLUOROSCOPY TIME:  Fluoroscopy Time:  36 Radiation Exposure Index (if provided by the fluoroscopic device): 1.3 Number of Acquired Spot Images: 0 FINDINGS: Needle was advanced to the medial margin of the humeral head at the level of the coracoid, this was repeated as described above. No immediate complications were noted. IMPRESSION: Technically successful shoulder aspiration yielding scant material as described. Electronically Signed   By: Donzetta KohutGeoffrey  Wile M.D.   On: 12/02/2020 09:57    Medications:   . aspirin EC  81 mg Oral q morning  . Chlorhexidine Gluconate Cloth  6 each Topical Daily  . diclofenac Sodium  2 g Topical QID  . doxycycline  100 mg Oral Q12H  . empagliflozin  25 mg Oral q morning  . enoxaparin (LOVENOX) injection  40 mg Subcutaneous Q24H  . gabapentin  300 mg Oral QHS  . insulin aspart  0-15 Units Subcutaneous TID WC  . lisinopril  10 mg Oral Daily  . multivitamin with minerals  1 tablet Oral q morning  . senna-docusate  1 tablet Oral BID  . vitamin B-12  1,000 mcg Oral Daily   Continuous Infusions: . cefTRIAXone (ROCEPHIN)  IV 2 g (12/02/20 1843)     LOS: 4 days   Joseph ArtJessica U Teriah Muela  [facility]   How to contact the [facility] Attending or Consulting provider 7A - 7P or covering provider during after hours 7P -7A, for this patient?  1. Check the care team  in [facility] and look for a) attending/consulting [facility] provider listed and b) the [facility] team listed 2. Log into [email or web address] and use [employer]'s universal password to access. If you do not have the password, please contact the hospital operator. 3. Locate the [facility] provider you are looking for under [facility] and  page to a number that you can be directly reached. 4. If you still have difficulty reaching the provider, please page the [facility] (Director on Call) for the Hospitalists listed on amion for assistance.  12/03/2020, 2:17 PM

## 2020-12-03 NOTE — Discharge Summary (Signed)
Physician Discharge Summary  Logan [name]:[id number] DOB: 03/14/1971 DOA: 11/29/2020  PCP: Rudene Anda, MD  Admit date: 11/29/2020 Discharge date: 12/03/2020  Admitted From: home Discharge disposition: home   Recommendations for Outpatient Follow-Up:   ID follow up CMP 1 at next office visit Monitor PDMP Encourage ibuprofen occasionally Ortho follow up Referral to rheumatology if shoulder not improved  Discharge Diagnosis:   Active Problems:   Diabetes ([place])   Shoulder pain    Discharge Condition: Improved.  Diet recommendation: Low sodium, heart healthy.  Carbohydrate-modified.  Wound care: None.  Code status: Full.   History of Present Illness:   Logan [name] is a 50 y.o. male with medical history significant of IDDM, HTN, HLD, recent diagnosed left shoulder septic arthritis, presented with worsening of left shoulder pain and fever.  Patient was admitted on April 9th for left shoulder septic arthritis.  Arthroscopic incision and drainage and debridement done on 04/11.  According to OR record, culture was not able to obtain.  Sent home based IV vancomycin via right arm PICC line.  And patient been following with orthopedic surgery as well as infectious disease.  Patient reported that the left shoulder pain never went away, and on the contrary has been gradually getting worse.  He still experiences periods of subjective fever and chills.  2 days ago, he contacted orthopedic surgery who added Levaquin to current vancomycin therapy.    Hospital Course by Problem:   -ID: Monoarticular arthritis would be ID most likely once crystal arthropathy/lyme/gonoococcal process r/o'ed. Without hardware, I would consider staph/strep as most common cause for it. While otezla is less likely than tnf-a inhibitor to cause atypical infection, I would consider this in him (endemic fungi/mycobacteria). He has no respiratory sx otherwise or recent pna but still would  consider. - IR joint aspiration with synovial fluid studies-- no crystals, low WBCs--  ANA pending,  RF-- per patient his family (sister and mother) have RA- RF negative -MRI shoulder:  1. Findings compatible with septic arthritis of the left glenohumeral joint and subacromial/subdeltoid septic bursitis. 2. Interval development of bone marrow edema at the posterior aspect of the greater tuberosity underlying the infraspinatus tendon insertion site with possible small erosion. Findings are concerning for developing osteomyelitis. 3. Patchy intramuscular edema throughout the rotator cuff musculature as well as the deltoid musculature suggesting myositis.  Sinustachycardia -improved  Mild transaminitis -New compared to the level on last admission. No RUQ pain. Will hold Statin for now.- outpatient follow up  IDDM with hyperglycemia -Recent A1C 8.3% -resume home meds  HTN -resume home meds  HLD -Hold Statin as above. -follow up outpatient for resumption    Medical Consultants:   Ortho  ID    Discharge Exam:   Vitals:   12/03/20 0410 12/03/20 1237  BP: 110/70 118/89  Pulse: 100 93  Resp: 18 18  Temp: 99.7 F (37.6 C) 98.7 F (37.1 C)  SpO2: 93% 96%   Vitals:   12/02/20 1828 12/02/20 2101 12/03/20 0410 12/03/20 1237  BP: (!) 130/95 110/82 110/70 118/89  Pulse: (!) 112 (!) 101 100 93  Resp: $Remo'16 18 18 18  'PQePl$ Temp: 98.9 F (37.2 C) 99.1 F (37.3 C) 99.7 F (37.6 C) 98.7 F (37.1 C)  TempSrc: Oral Oral Oral Oral  SpO2: 98% 97% 93% 96%  Weight:      Height:        General exam: Appears calm and comfortable.    The results of  significant diagnostics from this hospitalization (including imaging, microbiology, ancillary and laboratory) are listed below for reference.     Procedures and Diagnostic Studies:   DG Orthopantogram  Result Date: 11/29/2020 CLINICAL DATA:  Fever and chills EXAM: ORTHOPANTOGRAM/PANORAMIC COMPARISON:  None. FINDINGS: Visualized  paranasal sinuses are clear. Dental hardware. No visualized dental caries or periapical lucencies. IMPRESSION: No acute abnormality visualized. Electronically Signed   By: Dahlia Bailiff MD   On: 11/29/2020 20:28   DG Chest 1 View  Result Date: 11/29/2020 CLINICAL DATA:  Shoulder pain.  Fever EXAM: CHEST  1 VIEW COMPARISON:  11/15/2020 FINDINGS: Interval placement of a right-sided PICC line with distal tip terminating at the level of the distal SVC. The heart size and mediastinal contours are within normal limits. Low lung volumes with mild bibasilar atelectasis. No pleural effusion or pneumothorax. The visualized skeletal structures are unremarkable. IMPRESSION: Low lung volumes with bibasilar atelectasis. Electronically Signed   By: Davina Poke D.O.   On: 11/29/2020 13:56   MR SHOULDER LEFT W WO CONTRAST  Result Date: 11/29/2020 CLINICAL DATA:  Shoulder pain.  History of septic arthritis EXAM: MRI OF THE LEFT SHOULDER WITHOUT AND WITH CONTRAST TECHNIQUE: Multiplanar, multisequence MR imaging of the left shoulder was performed before and after the administration of intravenous contrast. CONTRAST:  35mL GADAVIST GADOBUTROL 1 MMOL/ML IV SOLN COMPARISON:  MRI 11/16/2020 FINDINGS: Rotator cuff: Rotator cuff is grossly intact. Severe supraspinatus and moderate infraspinatus tendinosis. Low-grade interstitial tearing of the distal supraspinatus tendon. No full-thickness rotator cuff tear. Muscles: Patchy intramuscular edema throughout the rotator cuff musculature as well as the deltoid musculature. Biceps long head: Nonvisualization of the intra-articular portion of the long head biceps tendon which may reflect tear or prior tenodesis/tenotomy. Acromioclavicular Joint: Mild arthropathy of the AC joint. Moderate sized complex subacromial-subdeltoid bursal fluid collection with thick peripheral enhancement (series 10, image 16). Glenohumeral Joint: Moderate-sized complex glenohumeral joint effusion with  thickened, enhancing synovium. No cartilage defect or erosion. Labrum:  Persistent superior labral tear. Bones: Interval development of bone marrow edema within the greater tuberosity underlying the infraspinatus tendon insertion site. Subcortical cyst or erosion at the greater tuberosity posteriorly (series 6, image 6). No fracture or dislocation. Other: Diffuse soft tissue swelling about the shoulder. IMPRESSION: 1. Findings compatible with septic arthritis of the left glenohumeral joint and subacromial/subdeltoid septic bursitis. 2. Interval development of bone marrow edema at the posterior aspect of the greater tuberosity underlying the infraspinatus tendon insertion site with possible small erosion. Findings are concerning for developing osteomyelitis. 3. Patchy intramuscular edema throughout the rotator cuff musculature as well as the deltoid musculature suggesting myositis. Electronically Signed   By: Davina Poke D.O.   On: 11/29/2020 19:22   VAS [place] UPPER EXTREMITY VENOUS DUPLEX  Result Date: 12/01/2020 UPPER VENOUS STUDY  Patient Name:  Logan [name]  Date of Exam:   11/30/2020 Medical Rec #: [id number]        Accession #:    [id number] Date of Birth: 28-Aug-1970        Patient Gender: M Patient Age:   50Y Exam Location:  [facility] Procedure:      VAS [place] UPPER EXTREMITY VENOUS DUPLEX Referring Phys: [id number] CORNELIUS N VAN DAM --------------------------------------------------------------------------------  Indications: fever, septic shoulder Comparison Study: No prior study Performing Technologist: Sharion Dove RVS  Examination Guidelines: A complete evaluation includes B-mode imaging, spectral Doppler, color Doppler, and power Doppler as needed of all accessible portions of each vessel. Bilateral testing is considered an  integral part of a complete examination. Limited examinations for reoccurring indications may be performed as noted.  Right Findings:  +----------+------------+---------+-----------+----------+---------------------+ RIGHT     CompressiblePhasicitySpontaneousProperties       Summary        +----------+------------+---------+-----------+----------+---------------------+ IJV           Full       Yes       Yes                                    +----------+------------+---------+-----------+----------+---------------------+ Subclavian               Yes       Yes                                    +----------+------------+---------+-----------+----------+---------------------+ Axillary                 Yes       Yes                                    +----------+------------+---------+-----------+----------+---------------------+ Brachial      Full                                                        +----------+------------+---------+-----------+----------+---------------------+ Cephalic      Full                                                        +----------+------------+---------+-----------+----------+---------------------+ Basilic       None                                  age indeterminate mid                                                            forearm        +----------+------------+---------+-----------+----------+---------------------+  Left Findings: +----------+------------+---------+-----------+----------+-------+ LEFT      CompressiblePhasicitySpontaneousPropertiesSummary +----------+------------+---------+-----------+----------+-------+ Subclavian               Yes       Yes                      +----------+------------+---------+-----------+----------+-------+  Summary:  Right: No evidence of deep vein thrombosis in the upper extremity. Findings consistent with age indeterminate superficial vein thrombosis involving a small segment in the forearm of the right basilic vein.  Left: No evidence of thrombosis in the subclavian.  *See table(s) above for  measurements and observations.  Diagnosing physician: Jamelle Haring Electronically signed by Jamelle Haring on 12/01/2020 at 8:52:57 AM.    Final      Labs:   Basic Metabolic  Panel: Recent Labs  Lab 11/29/20 1329 11/30/20 0251  NA 133* 134*  K 4.2 4.0  CL 99 102  CO2 22 24  GLUCOSE 188* 101*  BUN 15 16  CREATININE 0.85 0.68  CALCIUM 8.9 8.7*   GFR Estimated Creatinine Clearance: 100.1 mL/min (by C-G formula based on SCr of 0.68 mg/dL). Liver Function Tests: Recent Labs  Lab 11/29/20 1329 11/30/20 0251  AST 52* 44*  ALT 63* 61*  ALKPHOS 221* 194*  BILITOT 0.3 0.6  PROT 8.0 7.2  ALBUMIN 2.8* 2.5*   No results for input(s): LIPASE, AMYLASE in the last 168 hours. No results for input(s): AMMONIA in the last 168 hours. Coagulation profile Recent Labs  Lab 11/29/20 1329  INR 1.1    CBC: Recent Labs  Lab 11/29/20 1329 11/30/20 0251  WBC 9.7 8.5  NEUTROABS 6.1  --   HGB 14.1 13.0  HCT 44.5 41.3  MCV 89.5 90.4  PLT 142* 208   Cardiac Enzymes: No results for input(s): CKTOTAL, CKMB, CKMBINDEX, TROPONINI in the last 168 hours. BNP: Invalid input(s): POCBNP CBG: Recent Labs  Lab 12/02/20 1118 12/02/20 1612 12/02/20 2113 12/03/20 0606 12/03/20 1121  GLUCAP 163* 161* 122* 133* 115*   D-Dimer No results for input(s): DDIMER in the last 72 hours. Hgb A1c No results for input(s): HGBA1C in the last 72 hours. Lipid Profile No results for input(s): CHOL, HDL, LDLCALC, TRIG, CHOLHDL, LDLDIRECT in the last 72 hours. Thyroid function studies No results for input(s): TSH, T4TOTAL, T3FREE, THYROIDAB in the last 72 hours.  Invalid input(s): FREET3 Anemia work up No results for input(s): VITAMINB12, FOLATE, FERRITIN, TIBC, IRON, RETICCTPCT in the last 72 hours. Microbiology Recent Results (from the past 240 hour(s))  Blood culture (routine single)     Status: None   Collection Time: 11/27/20 11:43 AM   Specimen: Blood  Result Value Ref Range Status   MICRO  NUMBER: [id number]  Final   SPECIMEN QUALITY: Adequate  Final   Source BLOOD 1  Final   STATUS: FINAL  Final   Result: No growth after 5 days  Final   COMMENT: Aerobic and anaerobic bottle received.  Final  Blood culture (routine single)     Status: None   Collection Time: 11/27/20 11:46 AM   Specimen: Blood  Result Value Ref Range Status   MICRO NUMBER: [id number]  Final   SPECIMEN QUALITY: Adequate  Final   Source BLOOD 2  Final   STATUS: FINAL  Final   Result: No growth after 5 days  Final   COMMENT: Aerobic and anaerobic bottle received.  Final  Urine culture     Status: None   Collection Time: 11/29/20  1:18 PM   Specimen: In/Out Cath Urine  Result Value Ref Range Status   Specimen Description IN/OUT CATH URINE  Final   Special Requests NONE  Final   Culture   Final    NO GROWTH Performed at [facility], [place] [address] [place], [place] [address]    Report Status 11/30/2020 FINAL  Final  Resp Panel by RT-PCR (Flu A&B, Covid) Nasopharyngeal Swab     Status: None   Collection Time: 11/29/20  1:18 PM   Specimen: Nasopharyngeal Swab; Nasopharyngeal(NP) swabs in vial transport medium  Result Value Ref Range Status   SARS Coronavirus 2 by RT PCR NEGATIVE NEGATIVE Final    Comment: (NOTE) SARS-CoV-2 target nucleic acids are NOT DETECTED.  The SARS-CoV-2 RNA is generally detectable in upper respiratory specimens  during the acute phase of infection. The lowest concentration of SARS-CoV-2 viral copies this assay can detect is 138 copies/mL. A negative result does not preclude SARS-Cov-2 infection and should not be used as the sole basis for treatment or other patient management decisions. A negative result may occur with  improper specimen collection/handling, submission of specimen other than nasopharyngeal swab, presence of viral mutation(s) within the areas targeted by this assay, and inadequate number of viral copies(<138 copies/mL). A negative result must be combined  with clinical observations, patient history, and epidemiological information. The expected result is Negative.  Fact Sheet for Patients:  EntrepreneurPulse.com.au  Fact Sheet for Healthcare Providers:  IncredibleEmployment.be  This test is no t yet approved or cleared by the [place] FDA and  has been authorized for detection and/or diagnosis of SARS-CoV-2 by FDA under an Emergency Use Authorization (EUA). This EUA will remain  in effect (meaning this test can be used) for the duration of the COVID-19 declaration under Section 564(b)(1) of the Act, 21 U.S.C.section 360bbb-3(b)(1), unless the authorization is terminated  or revoked sooner.       Influenza A by PCR NEGATIVE NEGATIVE Final   Influenza B by PCR NEGATIVE NEGATIVE Final    Comment: (NOTE) The Xpert Xpress SARS-CoV-2/FLU/RSV plus assay is intended as an aid in the diagnosis of influenza from Nasopharyngeal swab specimens and should not be used as a sole basis for treatment. Nasal washings and aspirates are unacceptable for Xpert Xpress SARS-CoV-2/FLU/RSV testing.  Fact Sheet for Patients: EntrepreneurPulse.com.au  Fact Sheet for Healthcare Providers: IncredibleEmployment.be  This test is not yet approved or cleared by the [place] FDA and has been authorized for detection and/or diagnosis of SARS-CoV-2 by FDA under an Emergency Use Authorization (EUA). This EUA will remain in effect (meaning this test can be used) for the duration of the COVID-19 declaration under Section 564(b)(1) of the Act, 21 U.S.C. section 360bbb-3(b)(1), unless the authorization is terminated or revoked.  Performed at [facility], [place] [address] [place], [place] [address]   Blood Culture (routine x 2)     Status: None (Preliminary result)   Collection Time: 11/29/20  2:20 PM   Specimen: BLOOD RIGHT ARM  Result Value Ref Range Status   Specimen  Description BLOOD RIGHT ARM  Final   Special Requests   Final    BOTTLES DRAWN AEROBIC AND ANAEROBIC Blood Culture adequate volume   Culture   Final    NO GROWTH 4 DAYS Performed at [facility], [place] [address] [place], [place] [address]    Report Status PENDING  Incomplete  Blood Culture (routine x 2)     Status: None (Preliminary result)   Collection Time: 11/29/20  9:05 PM   Specimen: BLOOD LEFT HAND  Result Value Ref Range Status   Specimen Description BLOOD LEFT HAND  Final   Special Requests   Final    BOTTLES DRAWN AEROBIC AND ANAEROBIC Blood Culture adequate volume   Culture   Final    NO GROWTH 3 DAYS Performed at [facility], [place] [address], [place], [place] [address]    Report Status PENDING  Incomplete  Body fluid culture w Gram Stain     Status: None (Preliminary result)   Collection Time: 12/02/20  9:41 AM   Specimen: PATH Cytology Misc. fluid; Synovial Fluid  Result Value Ref Range Status   Specimen Description SYNOVIAL  Final   Special Requests NONE  Final   Gram Stain  Final    RARE WBC PRESENT,BOTH PMN AND MONONUCLEAR NO ORGANISMS SEEN    Culture   Final    NO GROWTH < 24 HOURS Performed at [facility], [place] [address], [place], [place] [address]    Report Status PENDING  Incomplete     Discharge Instructions:   Discharge Instructions    Diet - low sodium heart healthy   Complete by: As directed    Diet Carb Modified   Complete by: As directed    Increase activity slowly   Complete by: As directed    No wound care   Complete by: As directed      Allergies as of 12/03/2020      Reactions   Penicillins Hives, Rash   Red rashes when he was a teenager; tolerated cefazolin      Medication List    STOP taking these medications   atorvastatin 20 MG tablet Commonly known as: LIPITOR   meloxicam 15 MG tablet Commonly known as: MOBIC   senna-docusate 8.6-50 MG tablet Commonly known as: Senokot-S   vancomycin  IVPB      TAKE these medications   acetaminophen 650 MG CR tablet Commonly known as: TYLENOL Take 1,300 mg by mouth every 8 (eight) hours as needed for pain. What changed: Another medication with the same name was removed. Continue taking this medication, and follow the directions you see here.   aspirin EC 81 MG tablet Take 81 mg by mouth every morning.   Bayer Microlet Lancets lancets TEST UP TO 3 TIMES A DAY   blood glucose meter kit and supplies Dispense based on patient and insurance preference. Use up to 3 times daily, uncontrolled diabetes.  Contour Next meter with test strips and lancets.   CHROMIUM-CINNAMON PO Take 1 tablet by mouth 2 (two) times daily.   clobetasol 0.05 % external solution Commonly known as: TEMOVATE Apply 1 application topically 2 (two) times daily as needed (psoriasis).   Contour Next Test test strip Generic drug: glucose blood TEST UP TO 3 TIMES A DAY   cyclobenzaprine 5 MG tablet Commonly known as: FLEXERIL Take 1 tablet (5 mg total) by mouth 3 (three) times daily as needed for muscle spasms.   doxycycline 100 MG tablet Commonly known as: VIBRA-TABS Take 1 tablet (100 mg total) by mouth every 12 (twelve) hours.   empagliflozin 25 MG Tabs tablet Commonly known as: JARDIANCE Take 25 mg by mouth every morning.   fluticasone 50 MCG/ACT nasal spray Commonly known as: FLONASE PLACE 1-2 SPRAYS INTO BOTH NOSTRILS DAILY. What changed:   when to take this  reasons to take this   gabapentin 300 MG capsule Commonly known as: NEURONTIN Take 1 capsule (300 mg total) by mouth at bedtime.   ibuprofen 600 MG tablet Commonly known as: ADVIL Take 1 tablet (600 mg total) by mouth every 6 (six) hours as needed for mild pain.   insulin aspart 100 UNIT/ML FlexPen Commonly known as: NOVOLOG Insulin sliding scale: Blood sugar  120-150   3units                       151-200   4units                       201-250   7units                       251- 300   11units  301-350   15uints                       351-400   20units                       >400         call MD immediately What changed:   how much to take  how to take this  when to take this  additional instructions   Insulin Pen Needle 32G X 4 MM Misc 4x daily   levofloxacin 500 MG tablet Commonly known as: LEVAQUIN Take 1 tablet (500 mg total) by mouth daily.   lisinopril 10 MG tablet Commonly known as: ZESTRIL Take 1 tablet (10 mg total) by mouth daily.   loratadine 10 MG tablet Commonly known as: CLARITIN Take 10 mg by mouth daily as needed for allergies, rhinitis or itching.   metFORMIN 1000 MG tablet Commonly known as: GLUCOPHAGE TAKE 1 TABLET (1,000 MG TOTAL) BY MOUTH 2 (TWO) TIMES DAILY WITH A MEAL.   multivitamin with minerals Tabs tablet Take 1 tablet by mouth every morning. Men's 50 plus   Oxycodone HCl 10 MG Tabs Take 1 tablet (10 mg total) by mouth every 6 (six) hours as needed for up to 3 days. What changed: reasons to take this   polyethylene glycol 17 g packet Commonly known as: MIRALAX / GLYCOLAX Take 17 g by mouth daily. What changed:   when to take this  reasons to take this   Trulicity 3 DQ/2.2WL Sopn Generic drug: Dulaglutide Inject 3 mg into the skin every Monday.   vitamin B-12 1000 MCG tablet Commonly known as: CYANOCOBALAMIN Take 1 tablet (1,000 mcg total) by mouth daily.       Follow-up Information    Rudene Anda, MD Follow up in 1 week(s).   Specialty: Internal Medicine Contact information: [place] [address] [place] [address] [phone]        Michel Bickers, MD Follow up.   Specialty: Infectious Diseases Contact information: [address] [place] [address] [phone]                Time coordinating discharge: 35 min  Signed:  Geradine Girt DO  [facility] 12/03/2020, 3:57 PM

## 2020-12-03 NOTE — Progress Notes (Signed)
Patient may be weight bearing as tolerated.  Continue exercises shown in hospital.  No heavy lifting for now > 10lbs until seen by orthopedic doctor.  May return to work on 12/09/2020. Eulogio Bear

## 2020-12-03 NOTE — Evaluation (Signed)
Occupational Therapy Evaluation Patient Details Name: Logan [name] MRN: [id number] DOB: August 13, 1970 Today's Date: 12/03/2020    History of Present Illness Howie is a 50 y/o male who reported to ED with fever and worsening L shoulder pain. Pt with recent admission for I&D of L shoulder due top septic joint on 11/18/20. Admitted again on 11/29/20. Underwent another I&D on 12/02/20. PMH includes DM.   Clinical Impression   Pt PTA: Pt living alone and working, but unable to function due to pain. Pt currently, modified independence with ADL and mobility with no physical assist, but increased time. Pt reviewed compensatory UB/LB ADL techniques due to weakness in LUE and poor AROM. Pt shown figure 4 techniques for LB ADL instead of bending over. Education on not overdoing it with LUE and giving it time to heal. Pt well versed in L shoulder exercises/AAROM. Education provided for reading from MRIs of labrum tear and possible biceps long head tendon tear. Pt would benefit from continued OT skilled services in OP OT setting next. OT signing off.    Follow Up Recommendations  Outpatient OT    Equipment Recommendations  None recommended by OT    Recommendations for Other Services       Precautions / Restrictions Precautions Precautions: None Precaution Comments: Recent shoulder procedure Restrictions Weight Bearing Restrictions: Yes LUE Weight Bearing: Weight bearing as tolerated Other Position/Activity Restrictions: ROM as tolerated      Mobility Bed Mobility Overal bed mobility: Independent                  Transfers Overall transfer level: Independent                    Balance Overall balance assessment: Modified Independent                                         ADL either performed or assessed with clinical judgement   ADL Overall ADL's : Modified independent                                       General ADL Comments:  Reviewed compensatory UB ADL techniques due to weakness in LUE and poor AROM. Pt shown figure 4 techniques for LB ADL instead of bending over. Education on not overdoing it with LUE and giving it time to heal.     Vision Baseline Vision/History: Wears glasses Patient Visual Report: No change from baseline Vision Assessment?: No apparent visual deficits     Perception     Praxis      Pertinent Vitals/Pain Pain Assessment: 0-10 Pain Score: 4  Pain Location: L shoulder Pain Descriptors / Indicators: Aching;Constant;Sore Pain Intervention(s): Monitored during session     Hand Dominance Right   Extremity/Trunk Assessment Upper Extremity Assessment Upper Extremity Assessment: LUE deficits/detail LUE Deficits / Details: 0-20* FF, 0-10 ER, elbow flex/ext, wrist AROM, WFLs; han unable to make a fist LUE Coordination: decreased gross motor   Lower Extremity Assessment Lower Extremity Assessment: Overall WFL for tasks assessed   Cervical / Trunk Assessment Cervical / Trunk Assessment: Normal   Communication Communication Communication: No difficulties   Cognition Arousal/Alertness: Awake/alert Behavior During Therapy: WFL for tasks assessed/performed Overall Cognitive Status: Within Functional Limits for tasks assessed  General Comments       Exercises Exercises: Shoulder;Other exercises Shoulder Exercises Pendulum Exercise: AROM;Left;10 reps;Seated Shoulder Flexion: AAROM;Left;5 reps;Supine Other Exercises Other Exercises: Shoulder retraction x10 in seated Other Exercises: lap slides on lap or table; in all planes   Shoulder Instructions Shoulder Instructions Donning/doffing shirt without moving shoulder: Modified independent Method for sponge bathing under operated UE: Modified independent Pendulum exercises (written home exercise program): Independent ROM for elbow, wrist and digits of operated UE: Iatan expects to be discharged to:: Private residence Living Arrangements: Alone Available Help at Discharge: Family;Available PRN/intermittently;Other (Comment) (brother) Type of Home: Other(Comment) Home Access: Level entry     Home Layout: One level     Bathroom Shower/Tub: Teacher, early years/pre: Standard Bathroom Accessibility: Yes   Home Equipment: None          Prior Functioning/Environment Level of Independence: Independent        Comments: works in Programmer, applications, drives, 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 Able to complete bathing, dressing, and toileting independently but with increased pain        OT Problem List: Decreased range of motion;Impaired UE functional use;Pain      OT Treatment/Interventions: Therapeutic exercise;Patient/family education    OT Goals(Current goals can be found in the care plan section) Acute Rehab OT Goals Patient Stated Goal: get shoulder back to normal OT Goal Formulation: With patient Time For Goal Achievement: 12/17/20 Potential to Achieve Goals: Good  OT Frequency: Min 2X/week   Barriers to D/C:            Co-evaluation              AM-PAC OT "6 Clicks" Daily Activity     Outcome Measure Help from another person eating meals?: None Help from another person taking care of personal grooming?: None Help from another person toileting, which includes using toliet, bedpan, or urinal?: None Help from another person bathing (including washing, rinsing, drying)?: None Help from another person to put on and taking off regular upper body clothing?: None Help from another person to put on and taking off regular lower body clothing?: None 6 Click Score: 24   End of Session Nurse Communication: Mobility status  Activity Tolerance: Patient tolerated treatment well Patient left: in bed;with call bell/phone within  reach  OT Visit Diagnosis: Muscle weakness (generalized) (M62.81);Pain Pain - Right/Left: Left Pain - part of body: Shoulder                Time: 2202-5427 OT Time Calculation (min): 32 min Charges:  OT General Charges $OT Visit: 1 Visit OT Evaluation $OT Eval Moderate Complexity: 1 Mod OT Treatments $Therapeutic Exercise: 8-22 mins  Jefferey Pica, OTR/L Acute Rehabilitation Services Pager: [phone] Office: [phone]   Kale Dols C 12/03/2020, 4:47 PM

## 2020-12-03 NOTE — Progress Notes (Signed)
Discharge teaching complete. Meds, diet, activity, follow up appointments reviewed and all questions answered. Copy of instructions given to patient and meds brought to bedside by [facility] pharmacy. Patient discharged home via wheelchair to car parked in ER lot.

## 2020-12-03 NOTE — Telephone Encounter (Signed)
RN faxed forms to [employer] per patient request to Ida Rogue at [phone]. Received receipt of successful fax transmission.   Copy of completed forms placed in triage. Original completed forms sealed in envelope and placed at front desk for patient to pick up.   Beryle Flock, RN

## 2020-12-04 LAB — QUANTIFERON-TB GOLD PLUS (RQFGPL)
QuantiFERON Mitogen Value: 3.97 IU/mL
QuantiFERON Nil Value: 0.01 IU/mL
QuantiFERON TB1 Ag Value: 0.01 IU/mL
QuantiFERON TB2 Ag Value: 0.03 IU/mL

## 2020-12-04 LAB — CULTURE, BLOOD (ROUTINE X 2)
Culture: NO GROWTH
Special Requests: ADEQUATE

## 2020-12-04 LAB — QUANTIFERON-TB GOLD PLUS: QuantiFERON-TB Gold Plus: NEGATIVE

## 2020-12-05 LAB — BODY FLUID CULTURE W GRAM STAIN: Culture: NO GROWTH

## 2020-12-05 LAB — CULTURE, BLOOD (ROUTINE X 2)
Culture: NO GROWTH
Special Requests: ADEQUATE

## 2020-12-06 ENCOUNTER — Telehealth: Payer: Self-pay

## 2020-12-06 NOTE — Telephone Encounter (Signed)
Patient has been assigned a clinical case Freight forwarder through [facility]. Her name is Museum/gallery conservator. Phone is [phone] ext: [phone].   Called her to provide our fax number as requested in her vm. Left a voicemail with information she requested.   Shaunte Tuft Lorita Officer, RN

## 2020-12-16 NOTE — Telephone Encounter (Signed)
Received call from [facility] to clarify diagnosis codes for the patient, RN provided updated codes. Representative asking about admission/discharge dates and surgical history while inpatient. RN requested that [employer] please fax any requests for additional information.   Beryle Flock, RN

## 2020-12-16 NOTE — Telephone Encounter (Signed)
Received faxed forms from [employer]. Placed in provider's box for review and completion.   Beryle Flock, RN

## 2020-12-19 ENCOUNTER — Ambulatory Visit (INDEPENDENT_AMBULATORY_CARE_PROVIDER_SITE_OTHER): Payer: Commercial Managed Care - PPO | Admitting: Infectious Disease

## 2020-12-19 ENCOUNTER — Encounter: Payer: Self-pay | Admitting: Infectious Disease

## 2020-12-19 ENCOUNTER — Other Ambulatory Visit: Payer: Self-pay

## 2020-12-19 VITALS — BP 130/88 | HR 114 | Temp 97.8°F | Wt 161.0 lb

## 2020-12-19 DIAGNOSIS — L409 Psoriasis, unspecified: Secondary | ICD-10-CM | POA: Diagnosis not present

## 2020-12-19 DIAGNOSIS — Z794 Long term (current) use of insulin: Secondary | ICD-10-CM | POA: Diagnosis not present

## 2020-12-19 DIAGNOSIS — E1142 Type 2 diabetes mellitus with diabetic polyneuropathy: Secondary | ICD-10-CM | POA: Diagnosis not present

## 2020-12-19 DIAGNOSIS — M009 Pyogenic arthritis, unspecified: Secondary | ICD-10-CM | POA: Diagnosis not present

## 2020-12-19 NOTE — Progress Notes (Signed)
Subjective:    Patient ID: Logan [name], male    DOB: 04-Mar-1971, 50 y.o.   MRN: [id number]  HPI  Logan [name] is a 50 y.o. male who had apparently a tooth infection in January underneath one of his implants that potentially needed surgical attention but resolved with antibiotics.  He brought this up today in relation to his shoulder infection that was diagnosed this April.   Patient was initially admitted on 8 April with shoulder pain and fevers.  Temperature as high as 105 degrees at that time.  Blood cultures were taken and he was started on vancomycin and cefepime.  MRI was performed which showed evidence of glenohumeral septic arthritis and subacromial subdeltoid septic bursitis was deltoid and subscapularis myositis superior supraspinatus and intra-articular biceps tendinosis and labral tear,  Patient ultimately underwent left shoulder arthroscopic irrigation and debridement of the glenohumeral joint and debridement of the rotator anterior labrum superior labrum posterior labrum and biceps tenotomy.  No intraoperative cultures were done at that point in time.  He was seen by my partner Dr. Baxter Flattery who elected to treat the patient with IV vancomycin.  After discharge the patient began developing fevers and more tightness in his shoulder sensation that he felt quite similar to that which she had prior to his diagnosis of septic shoulder.  He was seen by my partner Dr. Megan Salon 2 days already when I saw him in the hospital and Levaquin was started.  He then continued to have fevers and was ultimately admitted to the hospital MRI of the shoulder was performed on the 22nd which showed moderate sized complex subacromial subdeltoid bursal fluid collection with enhancement concerning for septic arthritis and bursitis along with interval development of bone marrow edema the posterior aspect the greater tuberosity which were concerning for osteomyelitis.  Had patchy edema in  the muscles of the rotator cuff musculature concerning for myositis.  This shoulder joint was aspirated and cultures have been unrevealing.  Orthopedic surgery felt he did not need intervention.  He was treated with systemic antibiotics in the hospital including vancomycin and ceftriaxone.  He did not want to go home on IV antibiotics and was switched over to levofloxacin and doxycycline.  His pain in the shoulder joint has improved and he has greater range of motion.  He is still having subjective fevers at home and temperatures of up to 100 degrees when taken through a transdermal thermometer using an infrared probe.    Past Medical History:  Diagnosis Date  . Diabetes mellitus    takes Janumet and Invokana daily  . Hernia, umbilical   . Hypertension    takes Lisinopril daily  . Plaque psoriasis   . Seasonal allergies    takes Claritin daily and uses Flonase daily    Past Surgical History:  Procedure Laterality Date  . INSERTION OF MESH N/A 05/07/2016   Procedure: INSERTION OF MESH;  Surgeon: Erroll Luna, MD;  Location: [place];  Service: General;  Laterality: N/A;  . SHOULDER ARTHROSCOPY Left 11/18/2020   Procedure: ARTHROSCOPIC INCISIONAL DRAINAGE WITH EXTENSIVE DEBRIDEMENT;  Surgeon: Nicholes Stairs, MD;  Location: [place];  Service: Orthopedics;  Laterality: Left;  . TYMPANOSTOMY TUBE PLACEMENT    . VENTRAL HERNIA REPAIR N/A 05/07/2016   Procedure: REPAIR VENTRAL HERNIA;  Surgeon: Erroll Luna, MD;  Location: [place] [facility];  Service: General;  Laterality: N/A;    Family History  Problem Relation Age of Onset  . Hypertension Mother   . Diverticulosis Mother   .  Osteoporosis Mother   . Colon polyps Mother   . Diabetes Father   . Alcohol abuse Father   . Osteoporosis Sister   . Hypertension Sister   . Diabetes Brother   . Hypertension Brother   . Colon polyps Brother   . Hypertension Maternal Grandmother   . Heart disease Maternal Grandfather   . Diabetes Paternal  Grandfather   . Diabetes Brother   . Hypertension Brother   . Heart attack Brother   . Osteoporosis Sister   . Colon cancer Neg Hx   . Esophageal cancer Neg Hx   . Stomach cancer Neg Hx   . Rectal cancer Neg Hx       Social History   Socioeconomic History  . Marital status: Single    Spouse name: Not on file  . Number of children: Not on file  . Years of education: Not on file  . Highest education level: Not on file  Occupational History  . Not on file  Tobacco Use  . Smoking status: Never Smoker  . Smokeless tobacco: Never Used  Vaping Use  . Vaping Use: Never used  Substance and Sexual Activity  . Alcohol use: Yes    Comment: occ  . Drug use: No  . Sexual activity: Not on file  Other Topics Concern  . Not on file  Social History Narrative  . Not on file   Social Determinants of Health   Financial Resource Strain: Not on file  Food Insecurity: Not on file  Transportation Needs: Not on file  Physical Activity: Not on file  Stress: Not on file  Social Connections: Not on file    Allergies  Allergen Reactions  . Penicillins Hives and Rash    Red rashes when he was a teenager; tolerated cefazolin     Current Outpatient Medications:  .  acetaminophen (TYLENOL) 650 MG CR tablet, Take 1,300 mg by mouth every 8 (eight) hours as needed for pain., Disp: , Rfl:  .  aspirin EC 81 MG tablet, Take 81 mg by mouth every morning., Disp: , Rfl:  .  BAYER MICROLET LANCETS lancets, TEST UP TO 3 TIMES A DAY, Disp: 100 each, Rfl: 0 .  blood glucose meter kit and supplies, Dispense based on patient and insurance preference. Use up to 3 times daily, uncontrolled diabetes.  Contour Next meter with test strips and lancets., Disp: 1 each, Rfl: 0 .  CHROMIUM-CINNAMON PO, Take 1 tablet by mouth 2 (two) times daily., Disp: , Rfl:  .  clobetasol (TEMOVATE) 0.05 % external solution, Apply 1 application topically 2 (two) times daily as needed (psoriasis)., Disp: , Rfl:  .  CONTOUR NEXT  TEST test strip, TEST UP TO 3 TIMES A DAY, Disp: 100 strip, Rfl: 3 .  cyclobenzaprine (FLEXERIL) 5 MG tablet, Take 1 tablet (5 mg total) by mouth 3 (three) times daily as needed for muscle spasms., Disp: 30 tablet, Rfl: 0 .  doxycycline (VIBRA-TABS) 100 MG tablet, Take 1 tablet (100 mg total) by mouth every 12 (twelve) hours., Disp: 56 tablet, Rfl: 0 .  Dulaglutide (TRULICITY) 3 BP/7.9KF SOPN, Inject 3 mg into the skin every Monday., Disp: , Rfl:  .  empagliflozin (JARDIANCE) 25 MG TABS tablet, Take 25 mg by mouth every morning., Disp: , Rfl:  .  fluticasone (FLONASE) 50 MCG/ACT nasal spray, PLACE 1-2 SPRAYS INTO BOTH NOSTRILS DAILY. (Patient taking differently: Place 1-2 sprays into both nostrils daily as needed for allergies or rhinitis.), Disp: 48 mL, Rfl:  2 .  gabapentin (NEURONTIN) 300 MG capsule, Take 1 capsule (300 mg total) by mouth at bedtime., Disp: 30 capsule, Rfl: 1 .  ibuprofen (ADVIL) 600 MG tablet, Take 1 tablet (600 mg total) by mouth every 6 (six) hours as needed for mild pain., Disp: 30 tablet, Rfl: 0 .  insulin aspart (NOVOLOG) 100 UNIT/ML FlexPen, Insulin sliding scale: Blood sugar  120-150   3units                       151-200   4units                       201-250   7units                       251- 300  11units                       301-350   15uints                       351-400   20units                       >400         call MD immediately (Patient taking differently: Inject 3-20 Units into the skin See admin instructions. Iinject 3-20 units subcutaneously three times daily as needed per sliding scale: CBG 120-150 3units, 151-200 4 units, 201-250 7 units, 251-300 11 units, 301-350 15 units, 351-400 20 units >400 call MD immediatley), Disp: 15 mL, Rfl: 1 .  Insulin Pen Needle 32G X 4 MM MISC, 4x daily, Disp: 150 each, Rfl: 11 .  levofloxacin (LEVAQUIN) 500 MG tablet, Take 1 tablet (500 mg total) by mouth daily., Disp: 28 tablet, Rfl: 0 .  lisinopril (PRINIVIL,ZESTRIL) 10 MG  tablet, Take 1 tablet (10 mg total) by mouth daily., Disp: 90 tablet, Rfl: 1 .  loratadine (CLARITIN) 10 MG tablet, Take 10 mg by mouth daily as needed for allergies, rhinitis or itching., Disp: , Rfl:  .  metFORMIN (GLUCOPHAGE) 1000 MG tablet, TAKE 1 TABLET (1,000 MG TOTAL) BY MOUTH 2 (TWO) TIMES DAILY WITH A MEAL., Disp: 180 tablet, Rfl: 1 .  Multiple Vitamin (MULTIVITAMIN WITH MINERALS) TABS tablet, Take 1 tablet by mouth every morning. Men's 50 plus, Disp: , Rfl:  .  polyethylene glycol (MIRALAX / GLYCOLAX) 17 g packet, Take 17 g by mouth daily. (Patient taking differently: Take 17 g by mouth daily as needed (constipation).), Disp: 14 each, Rfl: 0 .  vitamin B-12 (CYANOCOBALAMIN) 1000 MCG tablet, Take 1 tablet (1,000 mcg total) by mouth daily., Disp: , Rfl:     Review of Systems  Constitutional: Positive for fever. Negative for activity change, appetite change, chills, diaphoresis, fatigue and unexpected weight change.  HENT: Negative for congestion, rhinorrhea, sinus pressure, sneezing, sore throat and trouble swallowing.   Eyes: Negative for photophobia and visual disturbance.  Respiratory: Negative for cough, chest tightness, shortness of breath, wheezing and stridor.   Cardiovascular: Negative for chest pain, palpitations and leg swelling.  Gastrointestinal: Negative for abdominal distention, abdominal pain, anal bleeding, blood in stool, constipation, diarrhea, nausea and vomiting.  Genitourinary: Negative for difficulty urinating, dysuria, flank pain and hematuria.  Musculoskeletal: Positive for arthralgias. Negative for back pain, gait problem, joint swelling and myalgias.  Skin: Negative for color change, pallor, rash and wound.  Neurological:  Negative for dizziness, tremors, weakness and light-headedness.  Hematological: Negative for adenopathy. Does not bruise/bleed easily.  Psychiatric/Behavioral: Negative for agitation, behavioral problems, confusion, decreased concentration,  dysphoric mood and sleep disturbance.       Objective:   Physical Exam Constitutional:      General: He is not in acute distress.    Appearance: Normal appearance. He is well-developed. He is not ill-appearing or diaphoretic.  HENT:     Head: Normocephalic and atraumatic.     Right Ear: Hearing and external ear normal.     Left Ear: Hearing and external ear normal.     Nose: No nasal deformity or rhinorrhea.  Eyes:     General: No scleral icterus.    Conjunctiva/sclera: Conjunctivae normal.     Right eye: Right conjunctiva is not injected.     Left eye: Left conjunctiva is not injected.     Pupils: Pupils are equal, round, and reactive to light.  Neck:     Vascular: No JVD.  Cardiovascular:     Rate and Rhythm: Normal rate and regular rhythm.     Heart sounds: Normal heart sounds, S1 normal and S2 normal.  Abdominal:     General: Bowel sounds are normal.     Palpations: Abdomen is soft.  Musculoskeletal:     Right shoulder: Normal.     Left shoulder: Decreased range of motion.     Cervical back: Normal range of motion and neck supple.     Right hip: Normal.     Left hip: Normal.     Right knee: Normal.     Left knee: Normal.  Lymphadenopathy:     Head:     Right side of head: No submandibular, preauricular or posterior auricular adenopathy.     Left side of head: No submandibular, preauricular or posterior auricular adenopathy.     Cervical: No cervical adenopathy.     Right cervical: No superficial or deep cervical adenopathy.    Left cervical: No superficial or deep cervical adenopathy.  Skin:    General: Skin is warm and dry.     Coloration: Skin is not pale.     Findings: No abrasion, bruising, ecchymosis, erythema, lesion or rash.     Nails: There is no clubbing.  Neurological:     General: No focal deficit present.     Mental Status: He is alert and oriented to person, place, and time.     Sensory: No sensory deficit.     Coordination: Coordination normal.      Gait: Gait normal.  Psychiatric:        Attention and Perception: He is attentive.        Mood and Affect: Mood normal.        Speech: Speech normal.        Behavior: Behavior normal. Behavior is cooperative.        Thought Content: Thought content normal.        Judgment: Judgment normal.           Assessment & Plan:  50 year old man with rices who is on immunomodulatory therapy that does put him at risk for opportunistic infections, who had a dental infection then followed by septic shoulder.  We have never identified a organisms in the shoulder and he currently is on Levaquin and doxycycline.  His most recent MRI to me was disconcerting and I was surprised that he did not have surgery.  Currently has ongoing subjective fevers and measured fevers  via infrared that are up to 100 degrees are disconcerting.  Pain does not seem to be worse.  We will check labs today and have him complete his antibiotic course.  I have asked him to monitor his symptoms and fever very closely and if they worsen to let us know immediately.  His oral antibiotics I am going to observe him off antibiotics because I expect that when he is off antibiotics he will clinically worsen and we can then prove that he does indeed need surgery.  I think need surgery for several reasons 1 from an infection control standpoint to get surgical troll of infection and also to give [place] deep cultures for bacterial pathogens as well as fungi and nontuberculous mycobacteria.  I spent greater than 40 minutes with the patient including greater than 50% of time in face to face counsel of the patient reviewing his radiographic data personally as laboratory data and in coordination of his care.

## 2020-12-20 LAB — COMPLETE METABOLIC PANEL WITH GFR
AG Ratio: 1.1 (calc) (ref 1.0–2.5)
ALT: 20 U/L (ref 9–46)
AST: 14 U/L (ref 10–35)
Albumin: 3.9 g/dL (ref 3.6–5.1)
Alkaline phosphatase (APISO): 151 U/L — ABNORMAL HIGH (ref 35–144)
BUN/Creatinine Ratio: 23 (calc) — ABNORMAL HIGH (ref 6–22)
BUN: 14 mg/dL (ref 7–25)
CO2: 26 mmol/L (ref 20–32)
Calcium: 9.4 mg/dL (ref 8.6–10.3)
Chloride: 102 mmol/L (ref 98–110)
Creat: 0.62 mg/dL — ABNORMAL LOW (ref 0.70–1.33)
GFR, Est African American: 132 mL/min/{1.73_m2} (ref >=60)
GFR, Est Non African American: 114 mL/min/{1.73_m2} (ref >=60)
Globulin: 3.7 g/dL (calc) (ref 1.9–3.7)
Glucose, Bld: 126 mg/dL — ABNORMAL HIGH (ref 65–99)
Potassium: 4.6 mmol/L (ref 3.5–5.3)
Sodium: 138 mmol/L (ref 135–146)
Total Bilirubin: 0.3 mg/dL (ref 0.2–1.2)
Total Protein: 7.6 g/dL (ref 6.1–8.1)

## 2020-12-20 LAB — CBC WITH DIFFERENTIAL/PLATELET
Absolute Monocytes: 1046 cells/uL — ABNORMAL HIGH (ref 200–950)
Basophils Absolute: 98 cells/uL (ref 0–200)
Basophils Relative: 0.8 %
Eosinophils Absolute: 344 cells/uL (ref 15–500)
Eosinophils Relative: 2.8 %
HCT: 42.4 % (ref 38.5–50.0)
Hemoglobin: 14.1 g/dL (ref 13.2–17.1)
Lymphs Abs: 2288 cells/uL (ref 850–3900)
MCH: 28.6 pg (ref 27.0–33.0)
MCHC: 33.3 g/dL (ref 32.0–36.0)
MCV: 86 fL (ref 80.0–100.0)
MPV: 10.3 fL (ref 7.5–12.5)
Monocytes Relative: 8.5 %
Neutro Abs: 8524 cells/uL — ABNORMAL HIGH (ref 1500–7800)
Neutrophils Relative %: 69.3 %
Platelets: 326 10*3/uL (ref 140–400)
RBC: 4.93 10*6/uL (ref 4.20–5.80)
RDW: 13.1 % (ref 11.0–15.0)
Total Lymphocyte: 18.6 %
WBC: 12.3 10*3/uL — ABNORMAL HIGH (ref 3.8–10.8)

## 2020-12-20 LAB — SEDIMENTATION RATE: Sed Rate: 77 mm/h — ABNORMAL HIGH (ref 0–20)

## 2020-12-20 LAB — C-REACTIVE PROTEIN: CRP: 18.9 mg/L — ABNORMAL HIGH (ref <8.0)

## 2020-12-26 ENCOUNTER — Telehealth: Payer: Self-pay

## 2020-12-26 NOTE — Telephone Encounter (Signed)
Completed STD paperwork faxed to [facility] [phone]) and Harlow Mares RN [facility]) [phone]. Copies placed in triage.   Amelia Burgard Lorita Officer, RN

## 2020-12-26 NOTE — Telephone Encounter (Signed)
Received fax from [facility] regarding STD. Forms were completed by Dr. Tommy Medal who saw patient on 5/12. MD is requesting STD contact patient's orthopedic team regarding restrictions/limitations.  Faxed forms to [facility]. F: [phone]. P: [place], RMA

## 2021-01-17 ENCOUNTER — Ambulatory Visit: Payer: Commercial Managed Care - PPO | Admitting: Infectious Disease

## 2021-02-14 ENCOUNTER — Ambulatory Visit: Payer: Commercial Managed Care - PPO | Admitting: Infectious Disease

## 2021-04-23 NOTE — Progress Notes (Deleted)
No action needed

## 2021-04-23 NOTE — Progress Notes (Signed)
This encounter was created in error - please disregard.

## 2021-04-23 NOTE — Progress Notes (Deleted)
No action needed/closed

## 2021-11-13 IMAGING — RF DG FLUORO GUIDE NDL PLC/BX
2 series · 2 of 2 positions shown · non-contrast
Comparison: none

CLINICAL DATA: Postop infection in LEFT shoulder.

EXAM:
LEFT shoulder aspiration.
TECHNIQUE: Time-out in consent were performed. An appropriate skin entrance
site was determined. The site was marked, prepped with Betadine,
draped in the usual sterile fashion, and infiltrated locally with
buffered Lidocaine. Twenty gauge spinal needle was advanced to the
superomedial margin of the humeral head under intermittent
fluoroscopy. Attempts were made to aspirate the joint yielding scant
bloody material on the first attempt. A second needle was passed
given that the first needle appeared to be occluded after sample was
obtained. Second needle yielded no fluid but was rinsed in place in
a sample container.
FLUOROSCOPY TIME:  Fluoroscopy Time:  36
Radiation Exposure Index (if provided by the fluoroscopic device):
1.3
Number of Acquired Spot Images: 0

[Series 1: cp_standard · 0.18mm/px · 1 of 1 slices shown (1 of 2)]
[im 1/1]
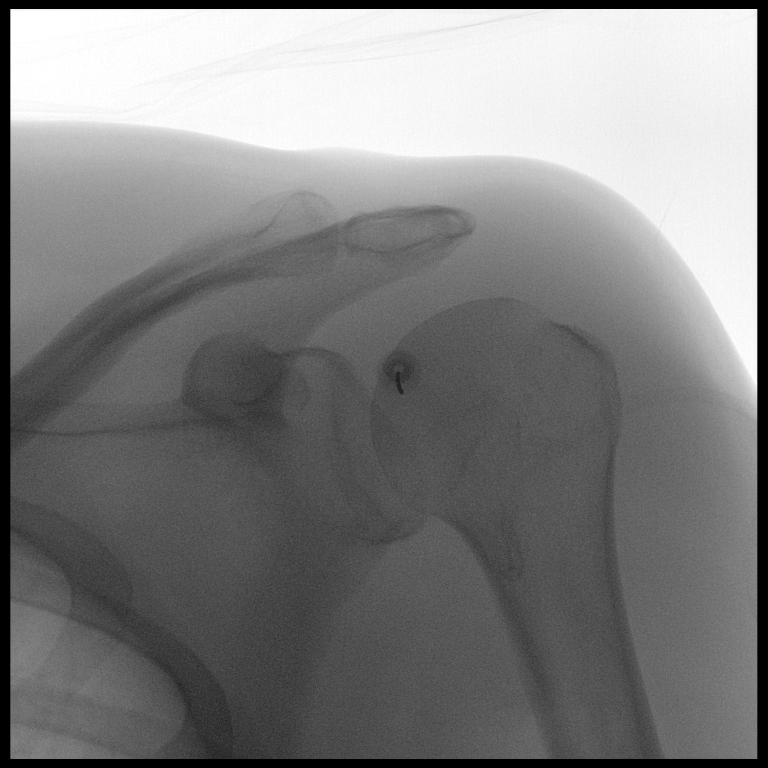

[Series 2: cp_standard · 0.18mm/px · 1 of 1 slices shown (2 of 2)]
[im 1/1]
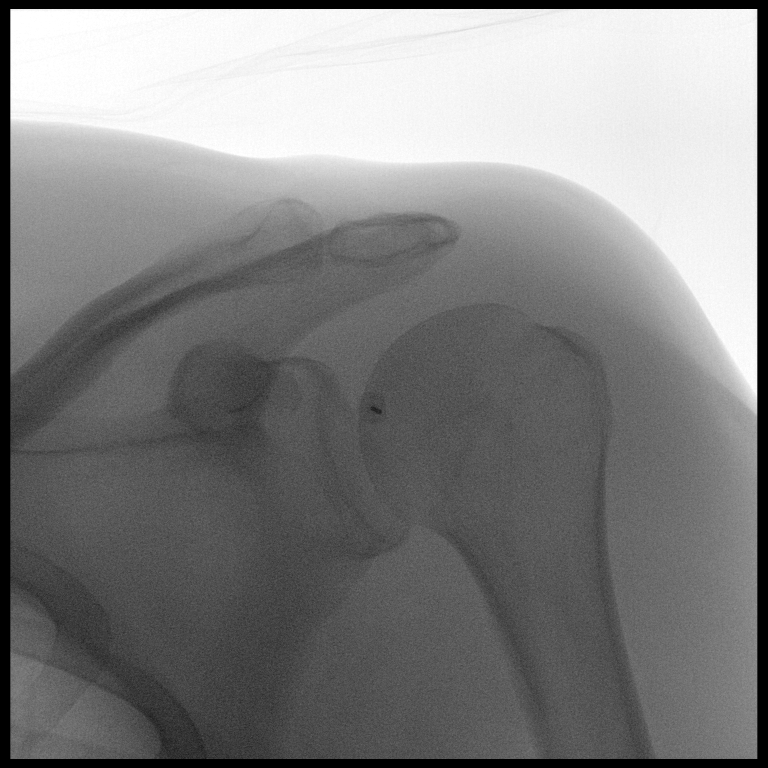

[2 of 2 positions shown; findings below may reference images not displayed]

FINDINGS: Needle was advanced to the medial margin of the humeral head at the
level of the coracoid, this was repeated as described above. No
immediate complications were noted.
IMPRESSION: Technically successful shoulder aspiration yielding scant material
as described.

## 2022-09-10 NOTE — Progress Notes (Signed)
This encounter was created in error - please disregard.

## 2024-08-07 ENCOUNTER — Telehealth: Payer: Self-pay

## 2024-08-07 NOTE — Telephone Encounter (Signed)
 Attempted to reach patient concerning colonoscopy recall; unable to speak with patient;  left message and number to the office for patient to call back and schedule appts;
